# Patient Record
Sex: Male | Born: 1975
Health system: Southern US, Community
[De-identification: ages and names within clinical notes are randomized; demographics above are authoritative.]

## PROBLEM LIST (undated history)

## (undated) DIAGNOSIS — F419 Anxiety disorder, unspecified: Secondary | ICD-10-CM

## (undated) DIAGNOSIS — F1020 Alcohol dependence, uncomplicated: Secondary | ICD-10-CM

## (undated) DIAGNOSIS — F329 Major depressive disorder, single episode, unspecified: Secondary | ICD-10-CM

## (undated) DIAGNOSIS — R55 Syncope and collapse: Secondary | ICD-10-CM

## (undated) DIAGNOSIS — F32A Depression, unspecified: Secondary | ICD-10-CM

## (undated) DIAGNOSIS — Z21 Asymptomatic human immunodeficiency virus [HIV] infection status: Secondary | ICD-10-CM

## (undated) DIAGNOSIS — G40909 Epilepsy, unspecified, not intractable, without status epilepticus: Secondary | ICD-10-CM

## (undated) DIAGNOSIS — B20 Human immunodeficiency virus [HIV] disease: Secondary | ICD-10-CM

## (undated) HISTORY — DX: Syncope and collapse: R55

## (undated) HISTORY — DX: Human immunodeficiency virus (HIV) disease: B20

## (undated) HISTORY — DX: Asymptomatic human immunodeficiency virus (hiv) infection status: Z21

## (undated) HISTORY — DX: Epilepsy, unspecified, not intractable, without status epilepticus: G40.909

## (undated) HISTORY — PX: SURGERY SCROTAL / TESTICULAR: SUR1316

---

## 1999-09-19 ENCOUNTER — Emergency Department (HOSPITAL_COMMUNITY): Admission: EM | Admit: 1999-09-19 | Discharge: 1999-09-19 | Payer: Self-pay | Admitting: Emergency Medicine

## 1999-10-18 ENCOUNTER — Emergency Department (HOSPITAL_COMMUNITY): Admission: EM | Admit: 1999-10-18 | Discharge: 1999-10-18 | Payer: Self-pay | Admitting: Emergency Medicine

## 1999-10-18 ENCOUNTER — Encounter: Payer: Self-pay | Admitting: Emergency Medicine

## 1999-10-19 ENCOUNTER — Encounter: Payer: Self-pay | Admitting: Emergency Medicine

## 2002-09-19 ENCOUNTER — Emergency Department (HOSPITAL_COMMUNITY): Admission: EM | Admit: 2002-09-19 | Discharge: 2002-09-19 | Payer: Self-pay | Admitting: Emergency Medicine

## 2002-09-19 ENCOUNTER — Encounter: Payer: Self-pay | Admitting: Emergency Medicine

## 2002-12-07 ENCOUNTER — Ambulatory Visit (HOSPITAL_COMMUNITY): Admission: RE | Admit: 2002-12-07 | Discharge: 2002-12-07 | Payer: Self-pay | Admitting: Urology

## 2002-12-07 ENCOUNTER — Ambulatory Visit (HOSPITAL_BASED_OUTPATIENT_CLINIC_OR_DEPARTMENT_OTHER): Admission: RE | Admit: 2002-12-07 | Discharge: 2002-12-07 | Payer: Self-pay | Admitting: Urology

## 2006-09-28 ENCOUNTER — Emergency Department (HOSPITAL_COMMUNITY): Admission: EM | Admit: 2006-09-28 | Discharge: 2006-09-28 | Payer: Self-pay | Admitting: Emergency Medicine

## 2008-07-05 ENCOUNTER — Emergency Department (HOSPITAL_COMMUNITY): Admission: EM | Admit: 2008-07-05 | Discharge: 2008-07-05 | Payer: Self-pay | Admitting: Emergency Medicine

## 2010-07-26 NOTE — Op Note (Signed)
[name], Thomas Atkins                            ACCOUNT NO.:  [id number]   MEDICAL RECORD NO.:  [id number]                   PATIENT TYPE:  AMB   LOCATION:  [facility]                                 FACILITY:  [facility]   PHYSICIAN:  Maretta Bees. Vonita Moss, M.D.             DATE OF BIRTH:  October 14, 1975   DATE OF PROCEDURE:  DATE OF DISCHARGE:                                 OPERATIVE REPORT   PREOPERATIVE DIAGNOSIS:  Large left hydrocele and penile adhesion.   POSTOPERATIVE DIAGNOSIS:  Large left hydrocele and penile adhesion.   OPERATION/PROCEDURE:  1. Left hydrocelectomy.  2. Lysis of penile adhesion.   SURGEON:  Maretta Bees. Vonita Moss, M.D.   ANESTHESIA:  General.   INDICATIONS:  This 35 year old gentleman has had a long history of  progressive swelling of the left scrotum documented on ultrasound as a  hydrocele and he is bothered by its large size and discomfort. He also has  residual adhesions in the dorsum of the penis measuring 2 cm on the top of  the penis between the shaft and the corona of the glans penis.   DESCRIPTION OF PROCEDURE:  The patient is brought to the operating room and  placed in the lithotomy position.  External genitalia were prepped and  draped in the usual fashion.  Using sharp dissection this penile adhesions  was lysed and the corona was sutured over it with 4-0 chromic catgut and the  other end on the shaft of the penis was also closed with running 4-0 chromic  catgut.  The vertical incision was then made in the left scrotum and the  hydrocele delivered and opened up and drained of several ounces of clear  typical hydrocele fluid.  A large portion of the hydrocele sac was then  resected with care to protect the vasculature and the vas deferens.  The  edge of the hydrocele sac was fulgurated.  It was then sewn behind the penis  to prevent recurrence using running 3-0 chromic catgut with care not to make  the closure too tight on the cord.  Hemostasis was obtained  with the use of  electrocautery.  An appendix testis was fulgurated.  The scrotum was  irrigated with sterile water and the wound closed with two layers  of  running 3-0 chromic catgut.  The wound was cleaned and dressed with  Neosporin and dry sterile gauze dressings.  The patient was taken to the  recovery room in good condition with no significant blood loss and good  hemostasis having tolerated the procedure well with a correct sponge, needle  and instrument count.                                                 Maretta Bees. Vonita Moss, M.D.  LJP/MEDQ  D:  12/07/2002  T:  12/07/2002  Job:  [id number]   cc:   [facility]

## 2010-10-20 ENCOUNTER — Emergency Department (HOSPITAL_COMMUNITY): Payer: Self-pay

## 2010-10-20 ENCOUNTER — Emergency Department (HOSPITAL_COMMUNITY)
Admission: EM | Admit: 2010-10-20 | Discharge: 2010-10-21 | Disposition: A | Discharge status: Home / Self Care | Payer: Self-pay | Attending: Emergency Medicine | Admitting: Emergency Medicine

## 2010-10-20 DIAGNOSIS — F1010 Alcohol abuse, uncomplicated: Secondary | ICD-10-CM | POA: Insufficient documentation

## 2010-10-20 DIAGNOSIS — F1410 Cocaine abuse, uncomplicated: Secondary | ICD-10-CM | POA: Insufficient documentation

## 2010-10-20 DIAGNOSIS — R55 Syncope and collapse: Secondary | ICD-10-CM | POA: Insufficient documentation

## 2010-10-20 DIAGNOSIS — I4510 Unspecified right bundle-branch block: Secondary | ICD-10-CM | POA: Insufficient documentation

## 2010-10-20 LAB — POCT I-STAT, CHEM 8
BUN: 9 mg/dL (ref 6–23)
Calcium, Ion: 1.08 mmol/L — ABNORMAL LOW (ref 1.12–1.32)
Chloride: 110 meq/L (ref 96–112)
Creatinine, Ser: 1.2 mg/dL (ref 0.50–1.35)
Glucose, Bld: 80 mg/dL (ref 70–99)
HCT: 49 % (ref 39.0–52.0)
Hemoglobin: 16.7 g/dL (ref 13.0–17.0)
Potassium: 4 mEq/L (ref 3.5–5.1)
Sodium: 143 mEq/L (ref 135–145)
TCO2: 20 mmol/L (ref 0–100)

## 2010-10-20 LAB — CBC
Platelets: 212 10*3/uL (ref 150–400)
RBC: 4.69 MIL/uL (ref 4.22–5.81)
WBC: 8.4 10*3/uL (ref 4.0–10.5)

## 2010-10-20 LAB — DIFFERENTIAL
Basophils Absolute: 0.1 10*3/uL (ref 0.0–0.1)
Basophils Relative: 1 % (ref 0–1)
Eosinophils Absolute: 0.2 10*3/uL (ref 0.0–0.7)
Neutrophils Relative %: 42 % — ABNORMAL LOW (ref 43–77)

## 2010-10-20 LAB — POCT I-STAT TROPONIN I: Troponin i, poc: 0.01 ng/mL (ref 0.00–0.08)

## 2010-10-21 LAB — RAPID URINE DRUG SCREEN, HOSP PERFORMED
Amphetamines: NOT DETECTED
Barbiturates: NOT DETECTED
Cocaine: POSITIVE — AB
Tetrahydrocannabinol: NOT DETECTED

## 2010-10-21 LAB — URINALYSIS, ROUTINE W REFLEX MICROSCOPIC
Nitrite: NEGATIVE
Specific Gravity, Urine: 1.013 (ref 1.005–1.030)
Urobilinogen, UA: 0.2 mg/dL (ref 0.0–1.0)

## 2010-10-21 LAB — URINE MICROSCOPIC-ADD ON

## 2010-10-21 LAB — ETHANOL: Alcohol, Ethyl (B): 274 mg/dL — ABNORMAL HIGH (ref 0–11)

## 2010-12-11 ENCOUNTER — Inpatient Hospital Stay (INDEPENDENT_AMBULATORY_CARE_PROVIDER_SITE_OTHER)
Admission: RE | Admit: 2010-12-11 | Discharge: 2010-12-11 | Disposition: A | Discharge status: Home / Self Care | Payer: Self-pay | Source: Ambulatory Visit | Attending: Emergency Medicine | Admitting: Emergency Medicine

## 2010-12-11 DIAGNOSIS — K5289 Other specified noninfective gastroenteritis and colitis: Secondary | ICD-10-CM

## 2010-12-11 DIAGNOSIS — N419 Inflammatory disease of prostate, unspecified: Secondary | ICD-10-CM

## 2010-12-11 LAB — POCT URINALYSIS DIP (DEVICE)
Bilirubin Urine: NEGATIVE
Glucose, UA: NEGATIVE mg/dL
Ketones, ur: NEGATIVE mg/dL
Leukocytes, UA: NEGATIVE
Nitrite: NEGATIVE
Protein, ur: NEGATIVE mg/dL
Specific Gravity, Urine: 1.01 (ref 1.005–1.030)
Urobilinogen, UA: 0.2 mg/dL (ref 0.0–1.0)
pH: 7 (ref 5.0–8.0)

## 2010-12-11 LAB — RAPID URINE DRUG SCREEN, HOSP PERFORMED
Barbiturates: NOT DETECTED
Cocaine: NOT DETECTED
Tetrahydrocannabinol: NOT DETECTED

## 2010-12-11 LAB — COMPREHENSIVE METABOLIC PANEL WITH GFR
ALT: 54 U/L — ABNORMAL HIGH (ref 0–53)
AST: 52 U/L — ABNORMAL HIGH (ref 0–37)
Albumin: 4.5 g/dL (ref 3.5–5.2)
Alkaline Phosphatase: 65 U/L (ref 39–117)
BUN: 9 mg/dL (ref 6–23)
CO2: 27 meq/L (ref 19–32)
Calcium: 10.1 mg/dL (ref 8.4–10.5)
Chloride: 101 meq/L (ref 96–112)
Creatinine, Ser: 0.79 mg/dL (ref 0.50–1.35)
GFR calc Af Amer: >90 mL/min
GFR calc non Af Amer: >90 mL/min
Glucose, Bld: 81 mg/dL (ref 70–99)
Potassium: 4.1 meq/L (ref 3.5–5.1)
Sodium: 138 meq/L (ref 135–145)
Total Bilirubin: 0.6 mg/dL (ref 0.3–1.2)
Total Protein: 8 g/dL (ref 6.0–8.3)

## 2010-12-11 LAB — CBC
HCT: 45.8 % (ref 39.0–52.0)
MCHC: 36.2 g/dL — ABNORMAL HIGH (ref 30.0–36.0)
Platelets: 221 10*3/uL (ref 150–400)
RDW: 12.8 % (ref 11.5–15.5)
WBC: 8.6 10*3/uL (ref 4.0–10.5)

## 2010-12-11 LAB — URINALYSIS, DIPSTICK ONLY
Bilirubin Urine: NEGATIVE
Glucose, UA: NEGATIVE mg/dL
Hgb urine dipstick: NEGATIVE
Ketones, ur: NEGATIVE mg/dL
Leukocytes, UA: NEGATIVE
Nitrite: NEGATIVE
Protein, ur: NEGATIVE mg/dL
Specific Gravity, Urine: 1.007 (ref 1.005–1.030)
Urobilinogen, UA: 0.2 mg/dL (ref 0.0–1.0)
pH: 7 (ref 5.0–8.0)

## 2010-12-11 LAB — OCCULT BLOOD, POC DEVICE: Fecal Occult Bld: NEGATIVE

## 2010-12-11 LAB — DIFFERENTIAL
Basophils Absolute: 0.1 10*3/uL (ref 0.0–0.1)
Basophils Relative: 1 % (ref 0–1)
Eosinophils Absolute: 0.2 10*3/uL (ref 0.0–0.7)
Eosinophils Relative: 3 % (ref 0–5)
Lymphocytes Relative: 31 % (ref 12–46)

## 2010-12-12 ENCOUNTER — Encounter: Payer: Self-pay | Admitting: Cardiovascular Disease

## 2010-12-12 LAB — URINE CULTURE
Colony Count: NO GROWTH
Culture  Setup Time: 201210031736

## 2010-12-13 ENCOUNTER — Encounter: Payer: Self-pay | Admitting: Cardiovascular Disease

## 2010-12-13 ENCOUNTER — Ambulatory Visit (INDEPENDENT_AMBULATORY_CARE_PROVIDER_SITE_OTHER): Payer: Self-pay | Admitting: Cardiovascular Disease

## 2010-12-13 DIAGNOSIS — R079 Chest pain, unspecified: Secondary | ICD-10-CM

## 2010-12-13 NOTE — Assessment & Plan Note (Signed)
Atypical chest pain. H/O tobacco abuse and pectus excavatum which could be associated with congenital abnormalities. Will get echo and arrange exercise treadmill stress test.

## 2010-12-13 NOTE — Patient Instructions (Signed)
Your physician has requested that you have an echocardiogram. Echocardiography is a painless test that uses sound waves to create images of your heart. It provides your doctor with information about the size and shape of your heart and how well your heart's chambers and valves are working. This procedure takes approximately one hour. There are no restrictions for this procedure.  Your physician has requested that you have an exercise tolerance test. For further information please visit [email or web address]. Please also follow instruction sheet, as given.   

## 2010-12-13 NOTE — Progress Notes (Signed)
History of Present Illness:35 yo WM with history of substance abuse, anxiety, ? Seizure disorder here today to establish cardiac care. He was seen in the Urgent care several days ago with prostatitis, diarrhea and was given Cipro. He tells me that he has been doing well overall. He walks frequently. He has a history of pectus excavatum and has frequent chest pains. He has been smoking for 22 years. He smokes 1/2 ppd. He describes an episode of passing out while having sex with his girlfriend. There was a question of seizure activity. He has chest pains on a daily basis, mild pressure. Also SOB lately with walking.   Past Medical History  Diagnosis Date  . Syncope     Past Surgical History  Procedure Date  . Surgery scrotal / testicular     Current Outpatient Prescriptions  Medication Sig Dispense Refill  . ciprofloxacin (CIPRO) 500 MG/5ML (10%) suspension Take by mouth 2 (two) times daily.        Marland Kitchen guaifenesin (HUMIBID E) 400 MG TABS Take 400 mg by mouth every 4 (four) hours.        . NON FORMULARY OVER THE COUNTER STORE BRAND PAIN RELIEF PM as needed for sleep         Allergies  Allergen Reactions  . Ultram (Tramadol Hcl)     History   Social History  . Marital Status: Divorced    Spouse Name: N/A    Number of Children: 2  . Years of Education: N/A   Occupational History  . Unemployed    Social History Main Topics  . Smoking status: Current Everyday Smoker  . Smokeless tobacco: Not on file  . Alcohol Use: Yes  . Drug Use: Yes  . Sexually Active: Not on file   Other Topics Concern  . Not on file   Social History Narrative  . No narrative on file    Family History  Problem Relation Age of Onset  . Heart disease Paternal Grandmother     Review of Systems:  As stated in the HPI and otherwise negative.   BP 121/86  Pulse 86  Ht 6\' 1"  (1.854 m)  Wt 150 lb (68.04 kg)  BMI 19.79 kg/m2  Physical Examination: General: Well developed, well nourished, NAD HEENT:  OP clear, mucus membranes moist SKIN: warm, dry. No rashes. Neuro: No focal deficits Musculoskeletal: Muscle strength 5/5 all extChest wall with pectus excavatum. Psychiatric: Mood and affect normal Neck: No JVD, no carotid bruits, no thyromegaly, no lymphadenopathy. Lungs:Clear bilaterally, no wheezes, rhonci, crackles Cardiovascular: Regular rate and rhythm. No murmurs, gallops or rubs. Abdomen:Soft. Bowel sounds present. Non-tender.  Extremities: No lower extremity edema. Pulses are 2 + in the bilateral DP/PT.  EKG: NSR, rate 76 bpm. Incomplete RBBB. LVH

## 2010-12-23 LAB — I-STAT 8, (EC8 V) (CONVERTED LAB)
BUN: 14
Bicarbonate: 20.2
Chloride: 111
Glucose, Bld: 88
HCT: 48
Hemoglobin: 16.3
Operator id: 146091
Sodium: 143
pCO2, Ven: 33.3 — ABNORMAL LOW

## 2010-12-23 LAB — POCT I-STAT CREATININE: Creatinine, Ser: 1

## 2010-12-23 LAB — POCT CARDIAC MARKERS: Operator id: 146091

## 2010-12-30 ENCOUNTER — Ambulatory Visit (INDEPENDENT_AMBULATORY_CARE_PROVIDER_SITE_OTHER): Payer: Self-pay | Admitting: Cardiovascular Disease

## 2010-12-30 ENCOUNTER — Ambulatory Visit (HOSPITAL_COMMUNITY): Payer: Self-pay | Attending: Cardiovascular Disease | Admitting: Radiology

## 2010-12-30 DIAGNOSIS — R55 Syncope and collapse: Secondary | ICD-10-CM | POA: Insufficient documentation

## 2010-12-30 DIAGNOSIS — I079 Rheumatic tricuspid valve disease, unspecified: Secondary | ICD-10-CM | POA: Insufficient documentation

## 2010-12-30 DIAGNOSIS — R079 Chest pain, unspecified: Secondary | ICD-10-CM

## 2010-12-30 DIAGNOSIS — R0609 Other forms of dyspnea: Secondary | ICD-10-CM | POA: Insufficient documentation

## 2010-12-30 DIAGNOSIS — I059 Rheumatic mitral valve disease, unspecified: Secondary | ICD-10-CM | POA: Insufficient documentation

## 2010-12-30 DIAGNOSIS — R0989 Other specified symptoms and signs involving the circulatory and respiratory systems: Secondary | ICD-10-CM | POA: Insufficient documentation

## 2010-12-30 DIAGNOSIS — R072 Precordial pain: Secondary | ICD-10-CM | POA: Insufficient documentation

## 2010-12-30 NOTE — Progress Notes (Signed)
Exercise Treadmill Test  Pre-Exercise Testing Evaluation Rhythm: normal sinus  Rate: 64   PR:  .15 QRS:  .10  QT:  .38 QTc: .39     Test  Exercise Tolerance Test Ordering MD: Melene Muller, MD  Interpreting MD:  Melene Muller, MD  Unique Test No: 1  Treadmill:  1  Indication for ETT: chest pain - rule out ischemia  Contraindication to ETT: No   Stress Modality: exercise - treadmill  Cardiac Imaging Performed: non   Protocol: standard Bruce - maximal  Max BP:  159/94  Max MPHR (bpm):  183 85% MPR (bpm):  155  MPHR obtained (bpm):  169 % MPHR obtained:  93  Reached 85% MPHR (min:sec):  7:00 Total Exercise Time (min-sec):  9:41  Workload in METS:  11.2 Borg Scale: 13  Reason ETT Terminated:  dyspnea    ST Segment Analysis At Rest: normal ST segments - no evidence of significant ST depression With Exercise: no evidence of significant ST depression  Other Information Arrhythmia:  No Angina during ETT:  absent (0) Quality of ETT:  non-diagnostic  ETT Interpretation:  normal - no evidence of ischemia by ST analysis  Comments: Pt exercised for 9 minutes and 41 seconds. He had no chest pain with exercise. No EKG changes to suggest ischemia. Normal blood pressure response to exercise.   Recommendations: No further ischemia workup.

## 2011-07-30 ENCOUNTER — Emergency Department (HOSPITAL_COMMUNITY)
Admission: EM | Admit: 2011-07-30 | Discharge: 2011-07-31 | Disposition: A | Discharge status: Home / Self Care | Payer: Self-pay | Attending: Emergency Medicine | Admitting: Emergency Medicine

## 2011-07-30 ENCOUNTER — Emergency Department (HOSPITAL_COMMUNITY): Payer: Self-pay

## 2011-07-30 ENCOUNTER — Encounter (HOSPITAL_COMMUNITY): Payer: Self-pay | Admitting: *Deleted

## 2011-07-30 DIAGNOSIS — R569 Unspecified convulsions: Secondary | ICD-10-CM | POA: Insufficient documentation

## 2011-07-30 DIAGNOSIS — R112 Nausea with vomiting, unspecified: Secondary | ICD-10-CM | POA: Insufficient documentation

## 2011-07-30 DIAGNOSIS — F17200 Nicotine dependence, unspecified, uncomplicated: Secondary | ICD-10-CM | POA: Insufficient documentation

## 2011-07-30 DIAGNOSIS — R7402 Elevation of levels of lactic acid dehydrogenase (LDH): Secondary | ICD-10-CM | POA: Insufficient documentation

## 2011-07-30 DIAGNOSIS — R059 Cough, unspecified: Secondary | ICD-10-CM | POA: Insufficient documentation

## 2011-07-30 DIAGNOSIS — R7401 Elevation of levels of liver transaminase levels: Secondary | ICD-10-CM

## 2011-07-30 DIAGNOSIS — R1013 Epigastric pain: Secondary | ICD-10-CM | POA: Insufficient documentation

## 2011-07-30 DIAGNOSIS — F1020 Alcohol dependence, uncomplicated: Secondary | ICD-10-CM

## 2011-07-30 DIAGNOSIS — R109 Unspecified abdominal pain: Secondary | ICD-10-CM | POA: Insufficient documentation

## 2011-07-30 DIAGNOSIS — R05 Cough: Secondary | ICD-10-CM | POA: Insufficient documentation

## 2011-07-30 HISTORY — DX: Alcohol dependence, uncomplicated: F10.20

## 2011-07-30 LAB — CBC
MCH: 33.3 pg (ref 26.0–34.0)
Platelets: 188 10*3/uL (ref 150–400)
RBC: 4.92 MIL/uL (ref 4.22–5.81)
RDW: 13.6 % (ref 11.5–15.5)
WBC: 7.9 10*3/uL (ref 4.0–10.5)

## 2011-07-30 LAB — COMPREHENSIVE METABOLIC PANEL
AST: 81 U/L — ABNORMAL HIGH (ref 0–37)
Alkaline Phosphatase: 70 U/L (ref 39–117)
BUN: 9 mg/dL (ref 6–23)
CO2: 21 mEq/L (ref 19–32)
Chloride: 104 mEq/L (ref 96–112)
Creatinine, Ser: 0.8 mg/dL (ref 0.50–1.35)
GFR calc non Af Amer: >90 mL/min (ref >90)
Potassium: 4.1 mEq/L (ref 3.5–5.1)
Total Bilirubin: 0.4 mg/dL (ref 0.3–1.2)

## 2011-07-30 MED ORDER — ONDANSETRON HCL 4 MG/2ML IJ SOLN
4.0000 mg | Freq: Once | INTRAMUSCULAR | Status: AC
  Administered 2011-07-31: 4 mg via INTRAVENOUS
  Filled 2011-07-30: qty 2

## 2011-07-30 MED ORDER — FENTANYL CITRATE 0.05 MG/ML IJ SOLN
100.0000 ug | Freq: Once | INTRAMUSCULAR | Status: AC
  Administered 2011-07-31: 100 ug via INTRAVENOUS
  Filled 2011-07-30: qty 2

## 2011-07-30 NOTE — ED Notes (Addendum)
C/o RUQ/rib pain, "describes as under rib", onset Monday, also cough, congestion, nvd, (denies: fever). 2 BM today, both incontinant diarrhea, last BM 1630, last ate 1530, last ETOH 2030, last ativan this am, admits to alcoholism, states, "not going into DTs", alert, NAD, calm, interactive, carrying emesis bag. Last emesis PTA.

## 2011-07-31 LAB — DIFFERENTIAL
Basophils Absolute: 0.1 10*3/uL (ref 0.0–0.1)
Eosinophils Absolute: 0.2 10*3/uL (ref 0.0–0.7)
Eosinophils Relative: 3 % (ref 0–5)
Lymphs Abs: 4.3 10*3/uL — ABNORMAL HIGH (ref 0.7–4.0)
Monocytes Absolute: 0.6 10*3/uL (ref 0.1–1.0)
Neutrophils Relative %: 34 % — ABNORMAL LOW (ref 43–77)

## 2011-07-31 LAB — LIPASE, BLOOD: Lipase: 57 U/L (ref 11–59)

## 2011-07-31 MED ORDER — PANTOPRAZOLE SODIUM 20 MG PO TBEC: 40.0000 mg | DELAYED_RELEASE_TABLET | Freq: Every day | ORAL | Status: DC

## 2011-07-31 MED ORDER — ONDANSETRON HCL 4 MG PO TABS: 4.0000 mg | ORAL_TABLET | Freq: Four times a day (QID) | ORAL | Status: DC

## 2011-07-31 MED ORDER — SODIUM CHLORIDE 0.9 % IV SOLN
Freq: Once | INTRAVENOUS | Status: AC
Start: 2011-07-31 — End: 2011-07-31
  Administered 2011-07-31: via INTRAVENOUS

## 2011-07-31 NOTE — Discharge Instructions (Signed)
Please take medications as prescribed. Avoid products that contain Tylenol. Tried to cut back or limit your drinking. Return to the emergency room for worsening condition or new concerning symptoms. Please followup with your primary care doctor and/or the specialist listed above. If you do not have a doctor, used the resource list below to establish a primary care provider.  RESOURCE GUIDE  Dental Problems  Patients with Medicaid: [facility] [phone]                                           [phone] [address] [employer] [address] Phone:  [phone]                                                  Phone:  [phone]  If unable to pay or uninsured, contact:  [facility] or [facility]. to become qualified for the adult dental clinic.  Chronic Pain Problems Contact [facility]  [phone] Patients need to be referred by their primary care doctor.  Insufficient Money for Medicine Contact [employer] Way:  call "211" or [facility] [phone].  No Primary Care Doctor Call [facility]  [phone] Other agencies that provide inexpensive medical care    [facility]  [phone]    [facility] Internal [facility]  [phone]    [facility]  [phone]    [facility]  [phone]    [facility]  [phone]    [facility]  [phone]  Psychological Services [facility] Behavioral Health  [phone] [facility]  [phone] [facility] Mental Health   [phone] (emergency services [phone])  Substance Abuse Resources [facility] Services  [phone] [facility] [phone] [facility] [place] [phone] [place] [phone] Residential & Outpatient Substance Abuse Program  [phone]  Abuse/Neglect [facility] Child Abuse Hotline ([phone] [facility] Child Abuse Hotline (218)834-5156 (After Hours)  Emergency Shelter [facility] [phone]  M[facility] [place] [facility] [place] [phone] [facility] [phone]  MRSA Hotline #:   405 131 2357    [facility] Resources  [facility] [place]     [employer]                          [facility]. [address] [place]                       [address]      [address]  [place]                                                Cristobal Goldmann Phone:  [phone]  Phone:  [phone]                 Phone:  [phone]  [facility] Mental Health Phone:  [phone]  [facility] Child Abuse Hotline [phone] [phone] (After Hours)  Abdominal Pain Abdominal pain can be caused by many things. Your caregiver decides the seriousness of your pain by an examination and possibly blood tests and X-rays. Many cases can be observed and treated at home. Most abdominal pain is not caused by a disease and will probably improve without treatment. However, in many cases, more time must pass before a clear cause of the pain can be found. Before that point, it may not be known if you need more testing, or if hospitalization or surgery is needed. HOME CARE INSTRUCTIONS   Do not take laxatives unless directed by your caregiver.   Take pain medicine only as directed by your caregiver.   Only take over-the-counter or prescription medicines for pain, discomfort, or fever as directed by your caregiver.   Try a clear liquid diet (broth, tea, or water) for as long as directed by your caregiver. Slowly move to a bland diet as tolerated.  SEEK IMMEDIATE MEDICAL CARE IF:   The pain does not go away.   You have a fever.   You keep throwing up (vomiting).   The pain is felt only in portions of the abdomen. Pain in the right side could possibly be appendicitis. In an adult, pain in the left lower portion of the  abdomen could be colitis or diverticulitis.   You pass bloody or black tarry stools.  MAKE SURE YOU:   Understand these instructions.   Will watch your condition.   Will get help right away if you are not doing well or get worse.  Document Released: 12/04/2004 Document Revised: 02/13/2011 Document Reviewed: 10/13/2007 [facility] Patient Information 2012 [place], [place].  Chronic Alcoholism Alcoholism is an addiction to alcohol. Addiction is a medical illness. It is not an one-time incident of heavy drinking that defines the disease of alcoholism.  The characteristics of addictive disease, such as alcoholism, include behaviors that the person finds pleasurable, at least initially. In alcohol addiction, drinking causes chemical changes in brain activity. This can lead to frequent cravings for alcohol. Unfortunately over time, an increased amount of alcohol is needed to produce the pleasure (tolerance). As a result, the person will start to feel uncomfortable symptoms when he or she is not drinking (withdrawal). Over time, a bad cycle develops. When painful withdrawal symptoms start to appear, alcohol is needed to make the symptoms go away. During this process, the person addicted to alcohol may become so used to the effects of alcohol that the usual signs of intoxication such as slurred speech, a staggering walk, or sleepiness may no longer be shown. Many people addicted to alcohol are able to function and even complete tasks. CAUSES   Drinking heavily and frequently.   Other factors like genetics.  SYMPTOMS   Headaches.   Frequent trouble falling or staying asleep (insomnia).   Irritability.   Uncontrolled shaking or movement (tremors).   Forgetting events (brownouts) or passing out (blackouts).   Seizures or hallucinations (delirium tremens).   Problems at work or at home that are related to drinking.   Medical problems related to drinking such as heart disease, stroke, high blood  pressure, diabetes, stomach ulcers, bleeding from the GI tract, and liver failure.   Trauma (falls, broken bones, automobile crashes).  TREATMENT  Alcoholism usually gets worse over time and almost never gets better without treatment. Your caregiver can help recommend a course of treatment for you depending on how severe your symptoms are and the level of your alcohol abuse. In some patients, stopping alcohol use or even decreasing use can bring about withdrawal symptoms that are dangerous or even deadly. For this reason, hospitalization is sometimes required to medically stabilize a patient.  If hospitalization is not required, but the risk of withdrawal is high, a detoxification (detox) facility may be recommended as an initial treatment step. In a detox center, medications can be given to protect against seizures and other withdrawal symptoms. Rehabilitation treatment may also be necessary. This is the process of treating the psychological and lifestyle element of addiction. There is both inpatient and outpatient rehabilitation treatment. Inpatient programs help patients through a systematic plan of psychological questioning, both individually and in groups, and at times using a "twelve-step" format. Outpatient rehabilitation programs have a similar structure and are aimed at promoting continued sobriety and preventing relapse into addiction. Make treatment decisions together with your caregiver. HOME CARE INSTRUCTIONS   If you are concerned about your alcohol use, talk to someone who can help. Trying to quit on your own is not easy. It can even be medically dangerous. This is especially true if you have been drinking heavily for a long time.   Call your caregiver, Alcoholics Anonymous, or other alcoholic treatment programs for help.   AL-ANON and ALA-TEEN are support groups for friends and family members of an alcohol or drug dependent person. These people also often need help too. For information  about these organizations, check your phone directory or the internet. You can also call a local alcohol or chemical dependency treatment center.  Document Released: 04/03/2004 Document Revised: 02/13/2011 Document Reviewed: 08/10/2009 [facility] Patient Information 2012 [place], [place].  B.R.A.T. Diet Your doctor has recommended the B.R.A.T. diet for you or your child until the condition improves. This is often used to help control diarrhea and vomiting symptoms. If you or your child can tolerate clear liquids, you may have:  Bananas.   Rice.   Applesauce.   Toast (and other simple starches such as crackers, potatoes, noodles).  Be sure to avoid dairy products, meats, and fatty foods until symptoms are better. Fruit juices such as apple, grape, and prune juice can make diarrhea worse. Avoid these. Continue this diet for 2 days or as instructed by your caregiver. Document Released: 02/24/2005 Document Revised: 02/13/2011 Document Reviewed: 08/13/2006 [facility] Patient Information 2012 [place], [place].   B.R.A.T. Diet Your doctor has recommended the B.R.A.T. diet for you or your child until the condition improves. This is often used to help control diarrhea and vomiting symptoms. If you or your child can tolerate clear liquids, you may have:  Bananas.   Rice.   Applesauce.   Toast (and other simple starches such as crackers, potatoes, noodles).  Be sure to avoid dairy products, meats, and fatty foods until symptoms are better. Fruit juices such as apple, grape, and prune juice can make diarrhea worse. Avoid these. Continue this diet for 2 days or as instructed by your caregiver. Document Released: 02/24/2005 Document Revised: 02/13/2011 Document Reviewed: 08/13/2006 [facility] Patient Information 2012 [place], [place].

## 2011-07-31 NOTE — ED Provider Notes (Signed)
History     CSN: [id number]  Arrival date & time 07/30/11  2220   First MD Initiated Contact with Patient 07/30/11 2321      Chief Complaint  Patient presents with  . Abdominal Pain  . Emesis    (Consider location/radiation/quality/duration/timing/severity/associated sxs/prior treatment) HPI 36 year old male presents to emergency department complaining of right-sided abdominal pain, epigastric pain, nausea and vomiting. Patient reports onset of symptoms Tuesday worsening on Wednesday. Patient reports he has a chronic cough each morning, it usually causes posttussive emesis. However over the last 2 days, he has had vomiting without cough. He denies any fever. Patient denies history of gallbladder disease, liver disease or gastritis. Patient is an alcoholic. He reports taking one half of a 0.5 Ativan given to him by a friend due to the shakes this morning. Patient was able to eat and drink today without difficulty. Eating does not make the pain worse. Patient drank alcohol this evening around 8 PM. Patient denies previous history of pancreatitis. Pain is a dull ache mainly up under his right sided ribs and epigastrium.  Past Medical History  Diagnosis Date  . Syncope   . Seizure disorder   . Alcoholism     Past Surgical History  Procedure Date  . Surgery scrotal / testicular     Family History  Problem Relation Age of Onset  . Heart disease Paternal Grandmother     History  Substance Use Topics  . Smoking status: Current Everyday Smoker -- 0.5 packs/day  . Smokeless tobacco: Not on file  . Alcohol Use: Yes      Review of Systems  All other systems reviewed and are negative.    Allergies  Ultram  Home Medications   Current Outpatient Rx  Name Route Sig Dispense Refill  . LORAZEPAM PO Oral Take 1 tablet by mouth once.      BP 112/87  Pulse 101  Temp(Src) 98.1 F (36.7 C) (Oral)  Resp 18  SpO2 97%  Physical Exam  Nursing note and vitals  reviewed. Constitutional: He is oriented to person, place, and time. He appears well-developed and well-nourished. No distress.       Patient is noted to have mild tachycardia  HENT:  Head: Normocephalic and atraumatic.  Eyes: Pupils are equal, round, and reactive to light.  Neck: Normal range of motion. Neck supple. No JVD present. No tracheal deviation present. No thyromegaly present.  Cardiovascular: Normal rate, regular rhythm, normal heart sounds and intact distal pulses.  Exam reveals no gallop and no friction rub.   No murmur heard. Pulmonary/Chest: Effort normal and breath sounds normal. No stridor. No respiratory distress. He has no wheezes. He has no rales. He exhibits no tenderness.  Abdominal: Soft. He exhibits no distension. There is no rebound and no guarding.       Pain with palpation in epigastrium mild tenderness in right upper quadrant without significant Murphy's sign  Musculoskeletal: Normal range of motion. He exhibits no edema and no tenderness.  Lymphadenopathy:    He has no cervical adenopathy.  Neurological: He is alert and oriented to person, place, and time. He exhibits normal muscle tone. Coordination normal.  Skin: He is not diaphoretic.  Psychiatric: He has a normal mood and affect. His behavior is normal. Judgment and thought content normal.    ED Course  Procedures (including critical care time)  Labs Reviewed  COMPREHENSIVE METABOLIC PANEL - Abnormal; Notable for the following:    AST 81 (*)    ALT  58 (*)    All other components within normal limits  DIFFERENTIAL - Abnormal; Notable for the following:    Neutrophils Relative 34 (*)    Lymphocytes Relative 54 (*)    Lymphs Abs 4.3 (*)    All other components within normal limits  CBC  LIPASE, BLOOD   Dg Chest 2 View  07/30/2011  *RADIOLOGY REPORT*  Clinical Data: Vomiting  CHEST - 2 VIEW  Comparison: 10/20/2010  Findings: Pectus excavatum and mild thoracic spine curvature. Lungs are expanded with  mild right apical pleural thickening again noted.  Otherwise, no focal areas of consolidation.  No pleural effusion or pneumothorax identified.  IMPRESSION: No radiographic evidence of acute cardiopulmonary process.  Original Report Authenticated By: Waneta Martins, M.D.     1. Abdominal pain   2. Nausea and vomiting   3. Alcoholism   4. Transaminitis       MDM  36 year old male with right-sided and epigastric abdominal pain. Differential includes pancreatitis, gastritis, cholelithiasis/cholecystitis, or liver disease. Will get lab work give IV fluids and pain and nausea medicine, may need ultrasound of abdomen as well.  3:35 AM Patient now unwilling to stay for ultrasound. Will discharge home with follow up with gastroenterology will treat nausea and vomiting. Suspect possible gastritis no signs of pancreatitis. Suspect transaminitis secondary to his alcohol intake.       Olivia Mackie, MD 07/31/11 [phone]

## 2011-07-31 NOTE — ED Notes (Signed)
Pt given Ice per MD

## 2011-07-31 NOTE — ED Notes (Signed)
Pt stating that he wants to leave. MD informed that he does not want to stay any longer and Md to discharge pt. Pt signed form. Pt walked to discharge after being given discharge paperwork

## 2011-07-31 NOTE — ED Notes (Signed)
Pt trying to leave to go smoke. Pt informed that this nurse needs to take his IV out. Pt states that he will stay for now. Pt informed that ultrasound is coming from [place] and it might be a while. Nurse told pt to let nurse know if he is going to leave before he does

## 2011-08-08 ENCOUNTER — Emergency Department (HOSPITAL_COMMUNITY)
Admission: EM | Admit: 2011-08-08 | Discharge: 2011-08-08 | Disposition: A | Discharge status: Home / Self Care | Payer: Self-pay | Attending: Emergency Medicine | Admitting: Emergency Medicine

## 2011-08-08 ENCOUNTER — Emergency Department (HOSPITAL_COMMUNITY): Payer: Self-pay

## 2011-08-08 ENCOUNTER — Encounter (HOSPITAL_COMMUNITY): Payer: Self-pay | Admitting: Emergency Medicine

## 2011-08-08 DIAGNOSIS — F17200 Nicotine dependence, unspecified, uncomplicated: Secondary | ICD-10-CM | POA: Insufficient documentation

## 2011-08-08 DIAGNOSIS — R059 Cough, unspecified: Secondary | ICD-10-CM | POA: Insufficient documentation

## 2011-08-08 DIAGNOSIS — G40802 Other epilepsy, not intractable, without status epilepticus: Secondary | ICD-10-CM | POA: Insufficient documentation

## 2011-08-08 DIAGNOSIS — R0602 Shortness of breath: Secondary | ICD-10-CM | POA: Insufficient documentation

## 2011-08-08 DIAGNOSIS — R259 Unspecified abnormal involuntary movements: Secondary | ICD-10-CM | POA: Insufficient documentation

## 2011-08-08 DIAGNOSIS — F1010 Alcohol abuse, uncomplicated: Secondary | ICD-10-CM | POA: Insufficient documentation

## 2011-08-08 DIAGNOSIS — R05 Cough: Secondary | ICD-10-CM | POA: Insufficient documentation

## 2011-08-08 LAB — CBC
Hemoglobin: 16.5 g/dL (ref 13.0–17.0)
MCH: 34.2 pg — ABNORMAL HIGH (ref 26.0–34.0)
MCHC: 36.5 g/dL — ABNORMAL HIGH (ref 30.0–36.0)
Platelets: 181 10*3/uL (ref 150–400)
RDW: 13.5 % (ref 11.5–15.5)

## 2011-08-08 MED ORDER — ONDANSETRON HCL 4 MG/2ML IJ SOLN
4.0000 mg | Freq: Once | INTRAMUSCULAR | Status: AC
  Administered 2011-08-08: 4 mg via INTRAVENOUS
  Filled 2011-08-08: qty 2

## 2011-08-08 MED ORDER — SODIUM CHLORIDE 0.9 % IV BOLUS (SEPSIS)
1000.0000 mL | Freq: Once | INTRAVENOUS | Status: AC
  Administered 2011-08-08: 1000 mL via INTRAVENOUS

## 2011-08-08 MED ORDER — PREDNISONE 20 MG PO TABS: ORAL_TABLET | ORAL | Status: DC

## 2011-08-08 MED ORDER — LORAZEPAM 2 MG/ML IJ SOLN
1.0000 mg | Freq: Once | INTRAMUSCULAR | Status: AC
  Administered 2011-08-08: 1 mg via INTRAVENOUS
  Filled 2011-08-08: qty 1

## 2011-08-08 MED ORDER — FAMOTIDINE 20 MG PO TABS: 20.0000 mg | ORAL_TABLET | Freq: Two times a day (BID) | ORAL | Status: DC

## 2011-08-08 NOTE — ED Notes (Signed)
DR Brown at bedside.

## 2011-08-08 NOTE — ED Notes (Signed)
Seen in ED 5/22 Symptoms not improving nausea vomiting tremors "hot sweats" and shortness of breath.  States drinks 12 beers a day and past week has decreased 2 beers one day and today none.

## 2011-08-08 NOTE — ED Provider Notes (Signed)
History     CSN: [id number]  Arrival date & time 08/08/11  1107   First MD Initiated Contact with Patient 08/08/11 1128      Chief Complaint  Patient presents with  . Shortness of Breath  . Tremors    (Consider location/radiation/quality/duration/timing/severity/associated sxs/prior treatment) HPI The patient is a 36 yo man, history of tobacco and alcohol abuse, presenting with subacute cough.  The patient notes a 10-day history of "feeling like someone is choking me", with significant throat discomfort (though without dysphagia), frequent coughing throughout the day, and post-tussive emesis.  The symptoms occasionally cause him dyspnea, and interrupt his sleep at night.  He notes some rhinorrhea and nasal congestion, which he believes is secondary to irritation from vomitus entering nostrils, but no history of allergies.  No fevers, sick contacts, exudative cough.  The patient was seen for this complaint on 5/22, at which time he also had R-sided and epigastric pain, thought to be secondary to gastritis, which has since resolved.  Lipase and CXR at that time were wnl.  He was discharged with zofran and protonix, which he did not fill due to financial constraints.  Past Medical History  Diagnosis Date  . Syncope   . Seizure disorder   . Alcoholism     Past Surgical History  Procedure Date  . Surgery scrotal / testicular     Family History  Problem Relation Age of Onset  . Heart disease Paternal Grandmother     History  Substance Use Topics  . Smoking status: Current Everyday Smoker -- 0.5 packs/day  . Smokeless tobacco: Not on file  . Alcohol Use: Yes      Review of Systems General: no fevers, chills, changes in weight, changes in appetite Skin: no rash HEENT: no blurry vision, hearing changes Pulm: see HPI CV: no chest pain, palpitations, shortness of breath Abd: no abdominal pain, diarrhea/constipation GU: no dysuria, hematuria, polyuria Ext: no arthralgias,  myalgias Neuro: no weakness, numbness, or tingling  Allergies  Ultram  Home Medications   Current Outpatient Rx  Name Route Sig Dispense Refill  . LORAZEPAM PO Oral Take 1 tablet by mouth once.      BP 116/90  Pulse 67  Temp(Src) 98.2 F (36.8 C) (Oral)  Resp 20  SpO2 97%  Physical Exam General: alert, cooperative, hunched over in bed, appears uncomfortable HEENT: PERRL, EOMI, oropharynx and posterior pharynx significantly erythematous with no tonsillar exudate present Neck: supple, no lymphadenopathy, JVD, or carotid bruits Lungs: clear to ascultation bilaterally, normal work of respiration, no wheezes, rales, ronchi Heart: regular rate and rhythm, no murmurs, gallops, or rubs Abdomen: soft, non-tender, non-distended, normal bowel sounds Extremities: no cyanosis, clubbing, or edema Neurologic: alert & oriented X3, cranial nerves II-XII intact, strength grossly intact, sensation intact to light touch  ED Course  Procedures (including critical care time)  Labs Reviewed  CBC - Abnormal; Notable for the following:    MCH 34.2 (*)    MCHC 36.5 (*)    All other components within normal limits   No results found.   No diagnosis found.    MDM   # Cough - the patient notes subacute cough.  Symptoms not quite classic for epiglottitis, but this is certainly a concern.  Other potential causes include chronic rhinosinusitis vs GERD vs URI vs subacute bronchial inflammation. -lateral x-ray neck -zofran -1L NS bolus -if neck x-ray negative, consider discharge on short course of steroids  Addendum 1:30 pm - x-ray shows no epiglottitis.  Will discharge on 8-day prednisone course and pepcid.  Linward Headland, MD 08/08/11 1340

## 2011-08-08 NOTE — Discharge Instructions (Signed)
Your labs and x-rays showed no specific cause for your symptoms.  Your symptoms are most likely due to inflammation in your airways and throat, causing continued cough.  To decrease this inflammation, take Prednisone, 40 mg per day for 4 days, followed by 20 mg per day for 4 days.    Also, we are prescribing Pepcid, to decrease your stomach acid, which can be irritating to your throat.  Take 1 tablet twice per day.

## 2011-08-10 NOTE — ED Provider Notes (Signed)
I  reviewed the resident's note and I agree with the findings and plan.     Nelia Shi, MD 08/10/11 [phone]

## 2011-08-17 ENCOUNTER — Emergency Department (HOSPITAL_COMMUNITY)
Admission: EM | Admit: 2011-08-17 | Discharge: 2011-08-17 | Discharge status: Absent without leave | Payer: Self-pay | Source: Home / Self Care

## 2011-08-17 ENCOUNTER — Emergency Department (HOSPITAL_COMMUNITY)
Admission: EM | Admit: 2011-08-17 | Discharge: 2011-08-17 | Disposition: A | Discharge status: Home / Self Care | Payer: Self-pay | Attending: Emergency Medicine | Admitting: Emergency Medicine

## 2011-08-17 ENCOUNTER — Encounter (HOSPITAL_COMMUNITY): Payer: Self-pay | Admitting: *Deleted

## 2011-08-17 DIAGNOSIS — F17200 Nicotine dependence, unspecified, uncomplicated: Secondary | ICD-10-CM | POA: Insufficient documentation

## 2011-08-17 DIAGNOSIS — K044 Acute apical periodontitis of pulpal origin: Secondary | ICD-10-CM | POA: Insufficient documentation

## 2011-08-17 DIAGNOSIS — G40909 Epilepsy, unspecified, not intractable, without status epilepticus: Secondary | ICD-10-CM | POA: Insufficient documentation

## 2011-08-17 DIAGNOSIS — K047 Periapical abscess without sinus: Secondary | ICD-10-CM

## 2011-08-17 MED ORDER — HYDROMORPHONE HCL PF 2 MG/ML IJ SOLN
2.0000 mg | Freq: Once | INTRAMUSCULAR | Status: AC
  Administered 2011-08-17: 2 mg via INTRAMUSCULAR
  Filled 2011-08-17: qty 1

## 2011-08-17 MED ORDER — AMOXICILLIN 500 MG PO CAPS: 500.0000 mg | ORAL_CAPSULE | Freq: Three times a day (TID) | ORAL | Status: AC

## 2011-08-17 MED ORDER — ONDANSETRON 4 MG PO TBDP
8.0000 mg | ORAL_TABLET | Freq: Once | ORAL | Status: AC
  Administered 2011-08-17: 8 mg via ORAL
  Filled 2011-08-17: qty 2

## 2011-08-17 MED ORDER — OXYCODONE-ACETAMINOPHEN 5-325 MG PO TABS: 1.0000 | ORAL_TABLET | ORAL | Status: AC | PRN

## 2011-08-17 NOTE — ED Notes (Signed)
Reports having facial swelling to right side of face since this am, denies toothache but did have something stuck in his gums x 3 days. Airway is intact but moderate swelling noted.

## 2011-08-17 NOTE — ED Provider Notes (Signed)
History   This chart was scribed for Flint Melter, MD scribed by Magnus Sinning. The patient was seen in room STRE4/STRE4 seen at 15:53.   CSN: [id number]  Arrival date & time 08/17/11  1423   None     Chief Complaint  Patient presents with  . Facial Swelling    (Consider location/radiation/quality/duration/timing/severity/associated sxs/prior treatment) HPI Thomas Atkins is a 36 y.o. male who presents to the Emergency Department complaining of moderate facial swelling concentrated to right cheek with associated constant moderate "burning" pain ,onset this morning after he woke up. Patient reports that he felt fine all day yesterday with no sxs. Denies any other associated sxs and states that he has not used any treatments for pain.  Past Medical History  Diagnosis Date  . Syncope   . Seizure disorder   . Alcoholism     Past Surgical History  Procedure Date  . Surgery scrotal / testicular     Family History  Problem Relation Age of Onset  . Heart disease Paternal Grandmother     History  Substance Use Topics  . Smoking status: Current Everyday Smoker -- 0.5 packs/day  . Smokeless tobacco: Not on file  . Alcohol Use: Yes      Review of Systems 10 Systems reviewed and are negative for acute change except as noted in the HPI. Allergies  Ultram  Home Medications   Current Outpatient Rx  Name Route Sig Dispense Refill  . LORAZEPAM PO Oral Take 1 tablet by mouth once.    Marland Kitchen NAPHAZOLINE HCL 0.012 % OP SOLN Both Eyes Place 2 drops into both eyes 4 (four) times daily as needed. For red eye    . AMOXICILLIN 500 MG PO CAPS Oral Take 1 capsule (500 mg total) by mouth 3 (three) times daily. 21 capsule 0  . OXYCODONE-ACETAMINOPHEN 5-325 MG PO TABS Oral Take 1 tablet by mouth every 4 (four) hours as needed for pain. 15 tablet 0    BP 124/90  Pulse 125  Temp(Src) 98.4 F (36.9 C) (Oral)  Resp 21  SpO2 96%  Physical Exam  Nursing note and vitals  reviewed. Constitutional: He is oriented to person, place, and time. He appears well-developed and well-nourished. No distress.  HENT:  Head: Normocephalic and atraumatic.  Eyes: Conjunctivae and EOM are normal.  Neck: Neck supple. No tracheal deviation present.  Cardiovascular: Normal rate.   Pulmonary/Chest: Effort normal. No respiratory distress.  Musculoskeletal: Normal range of motion.  Neurological: He is alert and oriented to person, place, and time.  Skin: Skin is warm and dry.  Psychiatric: He has a normal mood and affect. His behavior is normal.    ED Course  Procedures (including critical care time) DIAGNOSTIC STUDIES: Oxygen Saturation is 96% on room air, normal by my interpretation.    COORDINATION OF CARE:  INCISION AND DRAINAGE Performed by: Flint Melter Consent: Verbal consent obtained. Risks and benefits: risks, benefits and alternatives were discussed Type: abscess  Body area: right upper buccal gutter  Anesthesia: local infiltration  Local anesthetic: lidocaine 2% with epinephrine  Anesthetic total: 4 ml  Complexity: complex Blunt dissection to break up loculations    Drainage amount: none    Patient tolerance: Patient tolerated the procedure well with no immediate complications.     Labs Reviewed - No data to display No results found.   1. Dental infection       MDM  Apparent dental infection, without plus on I&D attempt. Doubt deep infection.  He has moderate dental decay and needs multiple extractions. He is stable for discharge.   I personally performed the services described in this documentation, which was scribed in my presence. The recorded information has been reviewed and considered.    Plan: Home Medications- Amox. And Percocet; Home Treatments- soft foods; Recommended follow up- Dental Care asap        Flint Melter, MD 08/17/11 [phone]

## 2011-08-17 NOTE — Discharge Instructions (Signed)
Call the dentist listed for an appointment, tomorrow. Return here if needed for problems.  Dental Care and Dentist Visits Dental care supports good overall health. Regular dental visits can also help you avoid dental pain, bleeding, infection, and other more serious health problems in the future. It is important to keep the mouth healthy because diseases in the teeth, gums, and other oral tissues can spread to other areas of the body. Some problems, such as diabetes, heart disease, and pre-term labor have been associated with poor oral health.  See your dentist every 6 months. If you experience emergency problems such as a toothache or broken tooth, go to the dentist right away. If you see your dentist regularly, you may catch problems early. It is easier to be treated for problems in the early stages.  WHAT TO EXPECT AT A DENTIST VISIT  Your dentist will look for many common oral health problems and recommend proper treatment. At your regular dental visit, you can expect:  Gentle cleaning of the teeth and gums. This includes scraping and polishing. This helps to remove the sticky substance around the teeth and gums (plaque). Plaque forms in the mouth shortly after eating. Over time, plaque hardens on the teeth as tartar. If tartar is not removed regularly, it can cause problems. Cleaning also helps remove stains.   Periodic X-rays. These pictures of the teeth and supporting bone will help your dentist assess the health of your teeth.   Periodic fluoride treatments. Fluoride is a natural mineral shown to help strengthen teeth. Fluoride treatmentinvolves applying a fluoride gel or varnish to the teeth. It is most commonly done in children.   Examination of the mouth, tongue, jaws, teeth, and gums to look for any oral health problems, such as:   Cavities (dental caries). This is decay on the tooth caused by plaque, sugar, and acid in the mouth. It is best to catch a cavity when it is small.    Inflammation of the gums caused by plaque buildup (gingivitis).   Problems with the mouth or malformed or misaligned teeth.   Oral cancer or other diseases of the soft tissues or jaws.  KEEP YOUR TEETH AND GUMS HEALTHY For healthy teeth and gums, follow these general guidelines as well as your dentist's specific advice:  Have your teeth professionally cleaned at the dentist every 6 months.   Brush twice daily with a fluoride toothpaste.   Floss your teeth daily.   Ask your dentist if you need fluoride supplements, treatments, or fluoride toothpaste.   Eat a healthy diet. Reduce foods and drinks with added sugar.   Avoid smoking.  TREATMENT FOR ORAL HEALTH PROBLEMS If you have oral health problems, treatment varies depending on the conditions present in your teeth and gums.  Your caregiver will most likely recommend good oral hygiene at each visit.   For cavities, gingivitis, or other oral health disease, your caregiver will perform a procedure to treat the problem. This is typically done at a separate appointment. Sometimes your caregiver will refer you to another dental specialist for specific tooth problems or for surgery.  SEEK IMMEDIATE DENTAL CARE IF:  You have pain, bleeding, or soreness in the gum, tooth, jaw, or mouth area.   A permanent tooth becomes loose or separated from the gum socket.   You experience a blow or injury to the mouth or jaw area.  Document Released: 11/06/2010 Document Revised: 02/13/2011 Document Reviewed: 11/06/2010 [facility] Patient Information 2012 [place], [place].

## 2012-02-07 ENCOUNTER — Emergency Department (HOSPITAL_COMMUNITY): Payer: Self-pay

## 2012-02-07 ENCOUNTER — Encounter (HOSPITAL_COMMUNITY): Payer: Self-pay | Admitting: Emergency Medicine

## 2012-02-07 ENCOUNTER — Emergency Department (HOSPITAL_COMMUNITY)
Admission: EM | Admit: 2012-02-07 | Discharge: 2012-02-07 | Disposition: A | Discharge status: Home / Self Care | Payer: Self-pay | Attending: Emergency Medicine | Admitting: Emergency Medicine

## 2012-02-07 DIAGNOSIS — F10929 Alcohol use, unspecified with intoxication, unspecified: Secondary | ICD-10-CM

## 2012-02-07 DIAGNOSIS — Z8669 Personal history of other diseases of the nervous system and sense organs: Secondary | ICD-10-CM | POA: Insufficient documentation

## 2012-02-07 DIAGNOSIS — R51 Headache: Secondary | ICD-10-CM | POA: Insufficient documentation

## 2012-02-07 DIAGNOSIS — F10229 Alcohol dependence with intoxication, unspecified: Secondary | ICD-10-CM | POA: Insufficient documentation

## 2012-02-07 DIAGNOSIS — F1020 Alcohol dependence, uncomplicated: Secondary | ICD-10-CM

## 2012-02-07 DIAGNOSIS — R079 Chest pain, unspecified: Secondary | ICD-10-CM | POA: Insufficient documentation

## 2012-02-07 DIAGNOSIS — F17200 Nicotine dependence, unspecified, uncomplicated: Secondary | ICD-10-CM | POA: Insufficient documentation

## 2012-02-07 LAB — ETHANOL: Alcohol, Ethyl (B): 312 mg/dL — ABNORMAL HIGH (ref 0–11)

## 2012-02-07 LAB — BLOOD GAS, ARTERIAL
Bicarbonate: 22.8 mEq/L (ref 20.0–24.0)
TCO2: 19.5 mmol/L (ref 0–100)
pCO2 arterial: 37.9 mmHg (ref 35.0–45.0)
pH, Arterial: 7.396 (ref 7.350–7.450)

## 2012-02-07 LAB — BASIC METABOLIC PANEL
BUN: 9 mg/dL (ref 6–23)
Calcium: 9.4 mg/dL (ref 8.4–10.5)
GFR calc non Af Amer: >90 mL/min (ref >90)
Glucose, Bld: 94 mg/dL (ref 70–99)

## 2012-02-07 LAB — CBC
HCT: 44.8 % (ref 39.0–52.0)
Hemoglobin: 15.5 g/dL (ref 13.0–17.0)
MCH: 33.2 pg (ref 26.0–34.0)
MCHC: 34.6 g/dL (ref 30.0–36.0)

## 2012-02-07 LAB — CARBOXYHEMOGLOBIN
Methemoglobin: 1.2 % (ref 0.0–1.5)
Total hemoglobin: 16.1 g/dL (ref 13.5–18.0)

## 2012-02-07 NOTE — ED Provider Notes (Addendum)
History     CSN: [id number]  Arrival date & time 02/07/12  1827   First MD Initiated Contact with Patient 02/07/12 2044      Chief Complaint  Patient presents with  . Chest Pain  . Headache    (Consider location/radiation/quality/duration/timing/severity/associated sxs/prior treatment) HPI Comments: Thomas Atkins is a 36 y.o. Male who presents for evaluation of headache, chest pain, and left arm pain. He also states that his eyes flicker and are blurred. He has been drinking alcohol heavily for 20 days. He is concerned about a furnace in his home,  causing his headache. He denies head trauma, nausea, vomiting, fever, or chills, back pain, weakness, or dizziness. There are no modifying factors.  Patient is a 36 y.o. male presenting with chest pain and headaches. The history is provided by the patient.  Chest Pain    Headache     Past Medical History  Diagnosis Date  . Syncope   . Seizure disorder   . Alcoholism     Past Surgical History  Procedure Date  . Surgery scrotal / testicular     Family History  Problem Relation Age of Onset  . Heart disease Paternal Grandmother     History  Substance Use Topics  . Smoking status: Current Every Day Smoker -- 0.5 packs/day  . Smokeless tobacco: Not on file  . Alcohol Use: 7.2 oz/week    12 Cans of beer per week      Review of Systems  Cardiovascular: Positive for chest pain.  Neurological: Positive for headaches.  All other systems reviewed and are negative.    Allergies  Ultram  Home Medications   Current Outpatient Rx  Name  Route  Sig  Dispense  Refill  . GUAIFENESIN ER 600 MG PO TB12   Oral   Take 600 mg by mouth daily.           BP 121/86  Pulse 93  Temp 98.6 F (37 C) (Oral)  Resp 18  SpO2 96%  Physical Exam  Nursing note and vitals reviewed. Constitutional: He is oriented to person, place, and time. He appears well-developed and well-nourished. No distress.  HENT:  Head: Normocephalic  and atraumatic.  Right Ear: External ear normal.  Left Ear: External ear normal.  Eyes: Conjunctivae normal and EOM are normal. Pupils are equal, round, and reactive to light.  Neck: Normal range of motion and phonation normal. Neck supple.  Cardiovascular: Normal rate, regular rhythm, normal heart sounds and intact distal pulses.   Pulmonary/Chest: Effort normal and breath sounds normal. He exhibits no bony tenderness.  Abdominal: Soft. Normal appearance. There is no tenderness.  Musculoskeletal: Normal range of motion. He exhibits no tenderness.  Neurological: He is alert and oriented to person, place, and time. He has normal strength. No cranial nerve deficit or sensory deficit. He exhibits normal muscle tone. Coordination (Mild dysmetria) abnormal.       Dysarthria consistent with alcohol intoxication  Skin: Skin is warm, dry and intact.  Psychiatric: He has a normal mood and affect. His behavior is normal. Judgment and thought content normal.    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  CHEST - 2 VIEW  Comparison: 07/30/2011  Findings:  The heart size and  mediastinal contours are within normal limits.  Both lungs are clear.  The visualized skeletal structures show stable pectus excavatum deformity.  IMPRESSION: No active disease.   Original Report Authenticated By: Irish Lack, M.D.    Ct Head Wo Contrast  02/07/2012  *RADIOLOGY REPORT*  Clinical Data: Headache and visual changes.  CT HEAD WITHOUT CONTRAST  Technique:  Contiguous axial images were obtained from the base of the skull through the vertex without contrast.  Comparison: None.  Findings: The brain demonstrates no evidence of hemorrhage, infarction, edema, mass effect, extra-axial fluid collection, hydrocephalus or mass lesion.  The skull is unremarkable.  IMPRESSION: Normal head CT.   Original Report Authenticated By: Irish Lack, M.D.      1. Headache   2. Chest pain   3. Alcohol intoxication   4. Alcoholism       MDM  Nonspecific headache, with alcohol intoxication. No evidence for carbon monoxide toxicity, head injury, ACS, or metabolic instability. Carboxyhemoglobin is 5%, consistent with his smoking history. Patient  will have a family member pick him up to take him home        Flint Melter, MD 02/07/12 2310  Flint Melter, MD 02/07/12 [phone]

## 2012-02-07 NOTE — ED Notes (Signed)
Patient reports that he has chest pain and left arm pain. The patient also reports that he is SOB. Reports that he has drank a 12 pack of beer today. Mother states that he has been on a "20 day bender". The patient is alert and oriented

## 2012-02-07 NOTE — ED Notes (Signed)
Patient returned from X-ray 

## 2012-07-20 ENCOUNTER — Encounter (HOSPITAL_COMMUNITY): Payer: Self-pay | Admitting: Emergency Medicine

## 2012-07-20 ENCOUNTER — Emergency Department (HOSPITAL_COMMUNITY)
Admission: EM | Admit: 2012-07-20 | Discharge: 2012-07-21 | Disposition: A | Discharge status: Home / Self Care | Payer: Self-pay | Attending: Emergency Medicine | Admitting: Emergency Medicine

## 2012-07-20 DIAGNOSIS — F1020 Alcohol dependence, uncomplicated: Secondary | ICD-10-CM | POA: Insufficient documentation

## 2012-07-20 DIAGNOSIS — F1210 Cannabis abuse, uncomplicated: Secondary | ICD-10-CM | POA: Insufficient documentation

## 2012-07-20 DIAGNOSIS — F1010 Alcohol abuse, uncomplicated: Secondary | ICD-10-CM

## 2012-07-20 DIAGNOSIS — Z8669 Personal history of other diseases of the nervous system and sense organs: Secondary | ICD-10-CM | POA: Insufficient documentation

## 2012-07-20 DIAGNOSIS — F17200 Nicotine dependence, unspecified, uncomplicated: Secondary | ICD-10-CM | POA: Insufficient documentation

## 2012-07-20 LAB — SALICYLATE LEVEL: Salicylate Lvl: <2 mg/dL — ABNORMAL LOW (ref 2.8–20.0)

## 2012-07-20 LAB — RAPID URINE DRUG SCREEN, HOSP PERFORMED
Cocaine: NOT DETECTED
Opiates: NOT DETECTED

## 2012-07-20 LAB — CBC
Hemoglobin: 16.1 g/dL (ref 13.0–17.0)
MCH: 34.4 pg — ABNORMAL HIGH (ref 26.0–34.0)
MCHC: 35.4 g/dL (ref 30.0–36.0)
MCV: 97.2 fL (ref 78.0–100.0)

## 2012-07-20 LAB — COMPREHENSIVE METABOLIC PANEL
ALT: 116 U/L — ABNORMAL HIGH (ref 0–53)
BUN: 9 mg/dL (ref 6–23)
Calcium: 9.5 mg/dL (ref 8.4–10.5)
Creatinine, Ser: 0.78 mg/dL (ref 0.50–1.35)
GFR calc Af Amer: >90 mL/min (ref >90)
GFR calc non Af Amer: >90 mL/min (ref >90)
Glucose, Bld: 94 mg/dL (ref 70–99)
Sodium: 143 mEq/L (ref 135–145)
Total Protein: 7.8 g/dL (ref 6.0–8.3)

## 2012-07-20 LAB — ETHANOL: Alcohol, Ethyl (B): 296 mg/dL — ABNORMAL HIGH (ref 0–11)

## 2012-07-20 MED ORDER — ZOLPIDEM TARTRATE 5 MG PO TABS: 5.0000 mg | ORAL_TABLET | Freq: Every evening | ORAL | Status: DC | PRN

## 2012-07-20 MED ORDER — ADULT MULTIVITAMIN W/MINERALS CH
1.0000 | ORAL_TABLET | Freq: Every day | ORAL | Status: DC
  Administered 2012-07-21: 1 via ORAL
  Filled 2012-07-20: qty 1

## 2012-07-20 MED ORDER — VITAMIN B-1 100 MG PO TABS
100.0000 mg | ORAL_TABLET | Freq: Every day | ORAL | Status: DC
  Administered 2012-07-21: 100 mg via ORAL
  Filled 2012-07-20: qty 1

## 2012-07-20 MED ORDER — ALUM & MAG HYDROXIDE-SIMETH 200-200-20 MG/5ML PO SUSP: 30.0000 mL | ORAL | Status: DC | PRN

## 2012-07-20 MED ORDER — LORAZEPAM 1 MG PO TABS: 0.0000 mg | ORAL_TABLET | Freq: Two times a day (BID) | ORAL | Status: DC

## 2012-07-20 MED ORDER — LORAZEPAM 1 MG PO TABS
1.0000 mg | ORAL_TABLET | Freq: Four times a day (QID) | ORAL | Status: DC | PRN
  Administered 2012-07-21: 1 mg via ORAL

## 2012-07-20 MED ORDER — IBUPROFEN 400 MG PO TABS: 600.0000 mg | ORAL_TABLET | Freq: Three times a day (TID) | ORAL | Status: DC | PRN

## 2012-07-20 MED ORDER — ACETAMINOPHEN 325 MG PO TABS: 650.0000 mg | ORAL_TABLET | ORAL | Status: DC | PRN

## 2012-07-20 MED ORDER — THIAMINE HCL 100 MG/ML IJ SOLN: 100.0000 mg | Freq: Every day | INTRAMUSCULAR | Status: DC

## 2012-07-20 MED ORDER — LORAZEPAM 1 MG PO TABS
0.0000 mg | ORAL_TABLET | Freq: Four times a day (QID) | ORAL | Status: DC
  Administered 2012-07-21: 2 mg via ORAL
  Filled 2012-07-20: qty 2

## 2012-07-20 MED ORDER — ONDANSETRON HCL 4 MG PO TABS
4.0000 mg | ORAL_TABLET | Freq: Three times a day (TID) | ORAL | Status: DC | PRN
  Administered 2012-07-21: 4 mg via ORAL
  Filled 2012-07-20 (×2): qty 1

## 2012-07-20 MED ORDER — FOLIC ACID 1 MG PO TABS
1.0000 mg | ORAL_TABLET | Freq: Every day | ORAL | Status: DC
  Administered 2012-07-21: 1 mg via ORAL
  Filled 2012-07-20: qty 1

## 2012-07-20 MED ORDER — NICOTINE 21 MG/24HR TD PT24
21.0000 mg | MEDICATED_PATCH | Freq: Every day | TRANSDERMAL | Status: DC
  Administered 2012-07-20 – 2012-07-21 (×2): 21 mg via TRANSDERMAL
  Filled 2012-07-20 (×2): qty 1

## 2012-07-20 MED ORDER — LORAZEPAM 1 MG PO TABS
1.0000 mg | ORAL_TABLET | Freq: Three times a day (TID) | ORAL | Status: DC | PRN
  Administered 2012-07-20 – 2012-07-21 (×2): 1 mg via ORAL
  Filled 2012-07-20 (×2): qty 1
  Filled 2012-07-20: qty 2

## 2012-07-20 MED ORDER — LORAZEPAM 2 MG/ML IJ SOLN: 1.0000 mg | Freq: Four times a day (QID) | INTRAMUSCULAR | Status: DC | PRN

## 2012-07-20 NOTE — ED Notes (Signed)
Clothes inventoried and placed in a bind.  Papers on chart

## 2012-07-20 NOTE — ED Provider Notes (Signed)
History  This chart was scribed for non-physician practitioner Junius Finner, PA-C working with Loren Racer, MD, by Candelaria Stagers, ED Scribe. This patient was seen in room TR09C/TR09C and the patient's care was started at 5:12 PM    CSN: [id number]  Arrival date & time 07/20/12  1605   First MD Initiated Contact with Patient 07/20/12 1611      Chief Complaint  Patient presents with  . Medical Clearance     The history is provided by the patient. No language interpreter was used.   HPI Comments: Thomas Atkins is a 37 y.o. male who presents to the Emergency Department seeking detox from alcohol.  Pt reports that he drinks about one case of beer per day with his last usage earlier today of 65oz of beer.  Pt also reports marijuana use.  Pt has gone through out patient treatment before one year ago at [facility] in [place], [place] which he reports was not helpful.  Stayed for 3 days then wanted to try home rehab. Pt denies SI/HI.  Pt smokes.  Pt reports experiencing depression, but does not take medications for this.  Pt also reports experiencing chronic intermittent abdominal pain.  Pt has h/o seizures associated with alcohol withdrawal.  Denies significant medical hx besides seizures.  No heart, lung, or abdominal problems.  No pain at this time.  Denies fever, n/v/d.     Past Medical History  Diagnosis Date  . Syncope   . Seizure disorder   . Alcoholism     Past Surgical History  Procedure Laterality Date  . Surgery scrotal / testicular      Family History  Problem Relation Age of Onset  . Heart disease Paternal Grandmother     History  Substance Use Topics  . Smoking status: Current Every Day Smoker -- 0.50 packs/day  . Smokeless tobacco: Not on file  . Alcohol Use: 7.2 oz/week    12 Cans of beer per week      Review of Systems  Constitutional: Negative for fever and chills.  Psychiatric/Behavioral: Negative for suicidal ideas and self-injury.  All other systems  reviewed and are negative.    Allergies  Ultram  Home Medications  No current outpatient prescriptions on file.  BP 130/99  Pulse 103  Temp(Src) 98.6 F (37 C) (Oral)  Resp 16  SpO2 95%  Physical Exam  Nursing note and vitals reviewed. Constitutional: He is oriented to person, place, and time. He appears well-developed and well-nourished. No distress.  HENT:  Head: Normocephalic and atraumatic.  Eyes: Conjunctivae and EOM are normal. Pupils are equal, round, and reactive to light. Right eye exhibits no discharge. Left eye exhibits no discharge. No scleral icterus.  Neck: Normal range of motion. Neck supple. No tracheal deviation present.  Cardiovascular: Normal rate, regular rhythm and normal heart sounds.   Pulmonary/Chest: Effort normal. No respiratory distress.  Abdominal: Soft. Bowel sounds are normal. He exhibits no distension and no mass. There is tenderness ( mid abdomin  ). There is no rebound and no guarding.  Musculoskeletal: Normal range of motion.  Neurological: He is alert and oriented to person, place, and time.  Skin: Skin is warm and dry.  Psychiatric: He has a normal mood and affect. His behavior is normal.    ED Course  Procedures   DIAGNOSTIC STUDIES: Oxygen Saturation is 95% on room air, normal by my interpretation.    COORDINATION OF CARE:  5:15 PM Discussed course of care with pt which includes  medical clearance.  Pt understands and agrees.    Labs Reviewed  CBC - Abnormal; Notable for the following:    MCH 34.4 (*)    All other components within normal limits  COMPREHENSIVE METABOLIC PANEL - Abnormal; Notable for the following:    AST 112 (*)    ALT 116 (*)    All other components within normal limits  ETHANOL - Abnormal; Notable for the following:    Alcohol, Ethyl (B) 296 (*)    All other components within normal limits  SALICYLATE LEVEL - Abnormal; Notable for the following:    Salicylate Lvl <2.0 (*)    All other components within  normal limits  ACETAMINOPHEN LEVEL  URINE RAPID DRUG SCREEN (HOSP PERFORMED)  ETHANOL   No results found.   1. Alcohol abuse       MDM  Pt requesting detox from [facility] not SI/HI.  Wants detox from alcohol, beer is his preference.  Was in [facility] in [place] last year.  Willing to try again.  Pt is medically cleared.  Consulted ACT who agreed to see patient.  Vitals: unremarkable. Discharged in stable condition.    Discussed pt with attending during ED encounter.         Junius Finner, PA-C 07/21/12 [phone]

## 2012-07-20 NOTE — ED Notes (Signed)
Pt requesting detox from ETOH; pt sts drinks a case of beer a day; pt sts last drank today

## 2012-07-20 NOTE — ED Notes (Signed)
Pt moved from fast track to room 25.  The pt is drowsy and he reports that he was given med in the other area and when he wakes up he is going to need more med.  His last alcohol was just pta
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
## 2012-07-21 NOTE — ED Notes (Signed)
Pt. Called his mom , will be picking pt. Up around 12:30

## 2012-07-21 NOTE — ED Provider Notes (Signed)
Patient accepted to [facility]. Denies tremors. BP 129/87  Pulse 86  Temp(Src) 97.8 F (36.6 C) (Oral)  Resp 18  SpO2 92%   [name], MD 07/21/12 [phone]

## 2012-07-21 NOTE — ED Notes (Signed)
[facility] called and informed that patient is leaving the department with mother at this time

## 2012-07-21 NOTE — BH Assessment (Signed)
[facility] Assessment Progress Note   Pt has been accepted to [facility] by Cory Munch there.  When patient's mother picks him up for transport ED nurse needs to call [facility] and let them know that patient has left.  [facility] phone is [phone].

## 2012-07-21 NOTE — ED Notes (Signed)
Pt. oob to the bathroom, gait steady.  Pt. Does feel anxious.  Dr. Manus Gunning at the bedside.

## 2012-07-21 NOTE — ED Notes (Signed)
Downtime completed. Please refer to Paper chart for information re: this Pt. during that time.

## 2012-07-22 NOTE — ED Provider Notes (Signed)
Medical screening examination/treatment/procedure(s) were performed by non-physician practitioner and as supervising physician I was immediately available for consultation/collaboration.   Emmette Katt, MD 07/22/12 0454 

## 2012-11-23 ENCOUNTER — Emergency Department (HOSPITAL_COMMUNITY)
Admission: EM | Admit: 2012-11-23 | Discharge: 2012-11-23 | Disposition: A | Discharge status: Home / Self Care | Payer: No Typology Code available for payment source | Attending: Emergency Medicine | Admitting: Emergency Medicine

## 2012-11-23 ENCOUNTER — Encounter (HOSPITAL_COMMUNITY): Payer: Self-pay | Admitting: Emergency Medicine

## 2012-11-23 DIAGNOSIS — F17200 Nicotine dependence, unspecified, uncomplicated: Secondary | ICD-10-CM | POA: Insufficient documentation

## 2012-11-23 DIAGNOSIS — R110 Nausea: Secondary | ICD-10-CM | POA: Insufficient documentation

## 2012-11-23 DIAGNOSIS — Z8669 Personal history of other diseases of the nervous system and sense organs: Secondary | ICD-10-CM | POA: Insufficient documentation

## 2012-11-23 DIAGNOSIS — R259 Unspecified abnormal involuntary movements: Secondary | ICD-10-CM | POA: Insufficient documentation

## 2012-11-23 DIAGNOSIS — F1010 Alcohol abuse, uncomplicated: Secondary | ICD-10-CM | POA: Insufficient documentation

## 2012-11-23 LAB — CBC
HCT: 45.2 % (ref 39.0–52.0)
MCH: 34.4 pg — ABNORMAL HIGH (ref 26.0–34.0)
MCHC: 36.7 g/dL — ABNORMAL HIGH (ref 30.0–36.0)
RDW: 13.1 % (ref 11.5–15.5)

## 2012-11-23 LAB — COMPREHENSIVE METABOLIC PANEL
Albumin: 4.4 g/dL (ref 3.5–5.2)
Alkaline Phosphatase: 68 U/L (ref 39–117)
BUN: 12 mg/dL (ref 6–23)
Calcium: 10.4 mg/dL (ref 8.4–10.5)
Creatinine, Ser: 0.76 mg/dL (ref 0.50–1.35)
GFR calc Af Amer: >90 mL/min (ref >90)
Glucose, Bld: 139 mg/dL — ABNORMAL HIGH (ref 70–99)
Potassium: 3.9 mEq/L (ref 3.5–5.1)
Total Protein: 8 g/dL (ref 6.0–8.3)

## 2012-11-23 LAB — SALICYLATE LEVEL: Salicylate Lvl: <2 mg/dL — ABNORMAL LOW (ref 2.8–20.0)

## 2012-11-23 LAB — ETHANOL: Alcohol, Ethyl (B): <11 mg/dL (ref 0–11)

## 2012-11-23 LAB — ACETAMINOPHEN LEVEL: Acetaminophen (Tylenol), Serum: <15 ug/mL (ref 10–30)

## 2012-11-23 MED ORDER — LORAZEPAM 1 MG PO TABS
1.0000 mg | ORAL_TABLET | Freq: Once | ORAL | Status: AC
  Administered 2012-11-23: 1 mg via ORAL
  Filled 2012-11-23: qty 1

## 2012-11-23 MED ORDER — LORAZEPAM 1 MG PO TABS: 1.0000 mg | ORAL_TABLET | Freq: Four times a day (QID) | ORAL | Status: DC | PRN

## 2012-11-23 NOTE — ED Notes (Addendum)
Pt brought in due to he is trying to detox himself from etoh since mon and states he has family services and AA that he is in and just wants some medication help for the shaking symptoms. States that he has been through this before and was able to do it at home. Pt does not want to do inpatient treatment denies any SI/HI shaking minimal , calm, dry

## 2012-11-23 NOTE — Progress Notes (Signed)
[facility] provided pt with a [facility] orange card application, highlighting [facility].  °

## 2012-11-23 NOTE — ED Provider Notes (Signed)
CSN: [id number]     Arrival date & time 11/23/12  1206 History   First MD Initiated Contact with Patient 11/23/12 1305     Chief Complaint  Patient presents with  . Medication Refill   (Consider location/radiation/quality/duration/timing/severity/associated sxs/prior Treatment) HPI Comments: Patient presents today voluntarily today requesting detox from alcohol.  He states that he does not want inpatient detox, but would like a prescription for Ativan and an antiemetic.  He states that he has successfully completed outpatient detox with these medications in the past.  He denies prior history of DT's.  He reports that his last drink was approximately 19 hours ago.  He typically drinks 18 beers a day.  He does not drink any liquor.  He reports mild tremor and nausea, but no other symptoms at this time.  He denies SI or HI.  The history is provided by the patient.    Past Medical History  Diagnosis Date  . Syncope   . Seizure disorder   . Alcoholism    Past Surgical History  Procedure Laterality Date  . Surgery scrotal / testicular     Family History  Problem Relation Age of Onset  . Heart disease Paternal Grandmother    History  Substance Use Topics  . Smoking status: Current Every Day Smoker -- 0.50 packs/day  . Smokeless tobacco: Not on file  . Alcohol Use: 7.2 oz/week    12 Cans of beer per week    Review of Systems  Gastrointestinal: Positive for nausea. Negative for vomiting.  Neurological: Positive for tremors.  All other systems reviewed and are negative.    Allergies  Ultram  Home Medications   Current Outpatient Rx  Name  Route  Sig  Dispense  Refill  . aspirin 325 MG tablet   Oral   Take 325 mg by mouth every 4 (four) hours as needed for pain.         Marland Kitchen loratadine (CLARITIN) 10 MG tablet   Oral   Take 10 mg by mouth daily as needed for allergies.          BP 133/99  Pulse 99  Temp(Src) 98 F (36.7 C)  Resp 24  SpO2 99% Physical Exam   Nursing note and vitals reviewed. Constitutional: He appears well-developed and well-nourished.  HENT:  Head: Normocephalic and atraumatic.  Mouth/Throat: Oropharynx is clear and moist.  Eyes: EOM are normal. Pupils are equal, round, and reactive to light.  Cardiovascular: Normal rate, regular rhythm and normal heart sounds.   Pulmonary/Chest: Effort normal and breath sounds normal.  Neurological: He is alert.  Skin: Skin is warm and dry.  Psychiatric: He has a normal mood and affect. He expresses no homicidal and no suicidal ideation.    ED Course  Procedures (including critical care time) Labs Review Labs Reviewed  CBC - Abnormal; Notable for the following:    MCH 34.4 (*)    MCHC 36.7 (*)    All other components within normal limits  COMPREHENSIVE METABOLIC PANEL - Abnormal; Notable for the following:    Sodium 133 (*)    Glucose, Bld 139 (*)    All other components within normal limits  SALICYLATE LEVEL - Abnormal; Notable for the following:    Salicylate Lvl <2.0 (*)    All other components within normal limits  ACETAMINOPHEN LEVEL  ETHANOL  URINE RAPID DRUG SCREEN (HOSP PERFORMED)  URINALYSIS, ROUTINE W REFLEX MICROSCOPIC   Imaging Review No results found.  MDM  No diagnosis  found. Patient presents today requesting detox from alcohol.  He states that he does not want inpatient detox and would like a prescription for Ativan.  He reports that he has successfully done outpatient detox in the past with Ativan.  He denies HI or SI.  Patient stable for discharge.  Return precautions discussed.    Pascal Lux [place], PA-C 11/24/12 1015  Pascal Lux [place], PA-C 11/24/12 1015

## 2012-11-24 NOTE — ED Provider Notes (Signed)
Medical screening examination/treatment/procedure(s) were performed by non-physician practitioner and as supervising physician I was immediately available for consultation/collaboration.    Vida Roller, MD 11/24/12 [phone]

## 2013-01-23 ENCOUNTER — Encounter (HOSPITAL_COMMUNITY): Payer: Self-pay | Admitting: Emergency Medicine

## 2013-01-23 ENCOUNTER — Emergency Department (HOSPITAL_COMMUNITY): Payer: No Typology Code available for payment source

## 2013-01-23 ENCOUNTER — Emergency Department (HOSPITAL_COMMUNITY)
Admission: EM | Admit: 2013-01-23 | Discharge: 2013-01-23 | Disposition: A | Discharge status: Home / Self Care | Payer: No Typology Code available for payment source | Attending: Emergency Medicine | Admitting: Emergency Medicine

## 2013-01-23 DIAGNOSIS — R61 Generalized hyperhidrosis: Secondary | ICD-10-CM | POA: Insufficient documentation

## 2013-01-23 DIAGNOSIS — R0602 Shortness of breath: Secondary | ICD-10-CM | POA: Insufficient documentation

## 2013-01-23 DIAGNOSIS — F17200 Nicotine dependence, unspecified, uncomplicated: Secondary | ICD-10-CM | POA: Insufficient documentation

## 2013-01-23 DIAGNOSIS — K2920 Alcoholic gastritis without bleeding: Secondary | ICD-10-CM

## 2013-01-23 DIAGNOSIS — Z7982 Long term (current) use of aspirin: Secondary | ICD-10-CM | POA: Insufficient documentation

## 2013-01-23 DIAGNOSIS — Z8669 Personal history of other diseases of the nervous system and sense organs: Secondary | ICD-10-CM | POA: Insufficient documentation

## 2013-01-23 DIAGNOSIS — F1021 Alcohol dependence, in remission: Secondary | ICD-10-CM | POA: Insufficient documentation

## 2013-01-23 LAB — COMPREHENSIVE METABOLIC PANEL
Albumin: 4.6 g/dL (ref 3.5–5.2)
BUN: 7 mg/dL (ref 6–23)
CO2: 23 mEq/L (ref 19–32)
Chloride: 102 mEq/L (ref 96–112)
Creatinine, Ser: 0.82 mg/dL (ref 0.50–1.35)
GFR calc non Af Amer: >90 mL/min (ref >90)
Total Bilirubin: 0.3 mg/dL (ref 0.3–1.2)

## 2013-01-23 LAB — CBC
HCT: 43.2 % (ref 39.0–52.0)
MCV: 95.6 fL (ref 78.0–100.0)
RDW: 13.6 % (ref 11.5–15.5)
WBC: 9.6 10*3/uL (ref 4.0–10.5)

## 2013-01-23 LAB — LIPASE, BLOOD: Lipase: 57 U/L (ref 11–59)

## 2013-01-23 LAB — TROPONIN I: Troponin I: <0.3 ng/mL (ref <0.30)

## 2013-01-23 LAB — ETHANOL: Alcohol, Ethyl (B): 405 mg/dL (ref 0–11)

## 2013-01-23 MED ORDER — GI COCKTAIL ~~LOC~~
ORAL | Status: AC
  Filled 2013-01-23: qty 30

## 2013-01-23 MED ORDER — GI COCKTAIL ~~LOC~~: 30.0000 mL | Freq: Once | ORAL | Status: DC

## 2013-01-23 MED ORDER — CHLORDIAZEPOXIDE HCL 25 MG PO CAPS: 50.0000 mg | ORAL_CAPSULE | Freq: Four times a day (QID) | ORAL | Status: DC

## 2013-01-23 NOTE — ED Notes (Signed)
Pt states his ribs hurt and chest hurt with breathing unable to catch breath. Dry Cough progressing x 3 days along with pain in chest.

## 2013-01-23 NOTE — ED Notes (Signed)
MD at bedside. 

## 2013-01-23 NOTE — ED Notes (Signed)
Per EMS pt states he has dyspnea and RUQ pain and pain with breathing. Pt states he was here 2 weeks ago for same s/s. Pt has tenderness in RUQ, Denies trauma, discoloration, crepitus. Room air saturation is 98%. He has an occasional dry cough with blood tinge sputum. Denies h/o TB. Admits to night sweats. Was given Valium by friend for insomia. Pt has strong odor of ETOH.   BP: 170/90 P:120 RR:18 SPO2: 98 Rhythm: ST

## 2013-01-23 NOTE — ED Provider Notes (Signed)
CSN: [id number]     Arrival date & time 01/23/13  1917 History   First MD Initiated Contact with Patient 01/23/13 1935     No chief complaint on file.  (Consider location/radiation/quality/duration/timing/severity/associated sxs/prior Treatment) HPI  37 y/o male here with 3-4 days of dyspnea and chest pain. States that his dyspnea came on gradually about 3 or 4 days ago without inciting event. Is not worsened with exertion or helped by rest. He describes his chest pain is right-sided mid chest pain sharp extending down to his right upper quadrant. Worsen with deep inspiration or cough. Has a history of alcohol abuse and admits to drinking 24 beers daily. He denies fevers but endorses profuse sweating at night. He has pectus excavatum and denies any history of chest pain with this.   Past Medical History  Diagnosis Date  . Syncope   . Seizure disorder   . Alcoholism    Past Surgical History  Procedure Laterality Date  . Surgery scrotal / testicular     Family History  Problem Relation Age of Onset  . Heart disease Paternal Grandmother    History  Substance Use Topics  . Smoking status: Current Every Day Smoker -- 0.50 packs/day  . Smokeless tobacco: Not on file  . Alcohol Use: 14.4 oz/week    24 Cans of beer per week     Comment: drinks case beer daily    Review of Systems  Constitutional: Positive for diaphoresis. Negative for fever and chills.  HENT: Negative for trouble swallowing.   Eyes: Negative for visual disturbance.  Respiratory: Positive for shortness of breath. Negative for cough.   Gastrointestinal: Positive for abdominal pain. Negative for nausea, vomiting and diarrhea.  Genitourinary: Negative for dysuria.  Musculoskeletal: Negative for back pain.  Skin: Negative for rash.  Neurological: Negative for headaches.  Psychiatric/Behavioral: Negative for confusion.    Allergies  Ultram  Home Medications   Current Outpatient Rx  Name  Route  Sig  Dispense   Refill  . aspirin 325 MG tablet   Oral   Take 325 mg by mouth every 4 (four) hours as needed for pain.         Marland Kitchen loratadine (CLARITIN) 10 MG tablet   Oral   Take 10 mg by mouth daily as needed for allergies.         Marland Kitchen LORazepam (ATIVAN) 1 MG tablet   Oral   Take 1 tablet (1 mg total) by mouth every 6 (six) hours as needed for anxiety.   20 tablet   0    BP 114/81  Pulse 82  Temp(Src) 97.9 F (36.6 C) (Oral)  Resp 16  SpO2 99% Physical Exam  Nursing note and vitals reviewed. Constitutional: He is oriented to person, place, and time. He appears well-developed and well-nourished. No distress.  HENT:  Head: Normocephalic and atraumatic.  Eyes: EOM are normal. Pupils are equal, round, and reactive to light.  Neck: Normal range of motion. Neck supple.  Cardiovascular: Normal rate, regular rhythm and normal heart sounds.   Pulmonary/Chest: Effort normal. No respiratory distress. He has no wheezes. He exhibits tenderness. He exhibits no mass.    Breath sounds diminished throughout with some scattered rhonchi.   Abdominal: Soft. Bowel sounds are normal. There is no tenderness.  Musculoskeletal: He exhibits no edema.  Neurological: He is alert and oriented to person, place, and time.  Skin: Skin is warm and dry. He is not diaphoretic.  Psychiatric: He has a normal mood and  affect.    ED Course  Procedures (including critical care time) Labs Review Labs Reviewed - No data to display Imaging Review No results found.  EKG Interpretation   None       MDM  No diagnosis found.  37 y/o mle with Hx of alcoholism with R reproducible chest pain and RUQ abd pain. Chest pain unlikely to be cardiac as it is reproducible and tropnonin neg. Other labs significant for slightly elevated LFTs, normal lipase, and EtOH of 405. 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 DIscussed that treatment is stopping drinking alcohol.    Offered Detox, patient  declines Discussed that tretament is quitting drinking Discharged home with librium to prevent DTs, red flags reviewed, return for worsening symptoms.       Elenora Gamma, MD 01/23/13 [phone]

## 2013-01-23 NOTE — ED Notes (Signed)
Bed: ZO10 Expected date:  Expected time:  Means of arrival:  Comments: EMS: RUQ Pain, Chest wall pain, SOB total x 2 days

## 2013-01-27 NOTE — ED Provider Notes (Signed)
I saw and evaluated the patient, reviewed the resident's note and I agree with the findings and plan.   .Face to face Exam:  General:  Awake HEENT:  Atraumatic Resp:  Normal effort Abd:  Nondistended Neuro:No focal weakness  Resa Rinks L Susannah Carbin, MD 01/27/13 0719 

## 2013-02-15 ENCOUNTER — Encounter (HOSPITAL_COMMUNITY): Payer: Self-pay | Admitting: Emergency Medicine

## 2013-02-15 ENCOUNTER — Emergency Department (HOSPITAL_COMMUNITY)
Admission: EM | Admit: 2013-02-15 | Discharge: 2013-02-16 | Disposition: A | Discharge status: Other Acute Inpatient Hospital | Payer: Federal, State, Local not specified - Other | Attending: Emergency Medicine | Admitting: Emergency Medicine

## 2013-02-15 DIAGNOSIS — F1010 Alcohol abuse, uncomplicated: Secondary | ICD-10-CM | POA: Diagnosis present

## 2013-02-15 DIAGNOSIS — F32A Depression, unspecified: Secondary | ICD-10-CM

## 2013-02-15 DIAGNOSIS — Z79899 Other long term (current) drug therapy: Secondary | ICD-10-CM | POA: Insufficient documentation

## 2013-02-15 DIAGNOSIS — F10239 Alcohol dependence with withdrawal, unspecified: Secondary | ICD-10-CM

## 2013-02-15 DIAGNOSIS — R61 Generalized hyperhidrosis: Secondary | ICD-10-CM | POA: Insufficient documentation

## 2013-02-15 DIAGNOSIS — F1020 Alcohol dependence, uncomplicated: Secondary | ICD-10-CM

## 2013-02-15 DIAGNOSIS — F17200 Nicotine dependence, unspecified, uncomplicated: Secondary | ICD-10-CM | POA: Insufficient documentation

## 2013-02-15 DIAGNOSIS — F10939 Alcohol use, unspecified with withdrawal, unspecified: Secondary | ICD-10-CM

## 2013-02-15 DIAGNOSIS — Z8669 Personal history of other diseases of the nervous system and sense organs: Secondary | ICD-10-CM | POA: Insufficient documentation

## 2013-02-15 DIAGNOSIS — F10229 Alcohol dependence with intoxication, unspecified: Secondary | ICD-10-CM | POA: Insufficient documentation

## 2013-02-15 DIAGNOSIS — F10929 Alcohol use, unspecified with intoxication, unspecified: Secondary | ICD-10-CM

## 2013-02-15 DIAGNOSIS — R109 Unspecified abdominal pain: Secondary | ICD-10-CM | POA: Insufficient documentation

## 2013-02-15 DIAGNOSIS — F329 Major depressive disorder, single episode, unspecified: Secondary | ICD-10-CM | POA: Diagnosis present

## 2013-02-15 DIAGNOSIS — R6883 Chills (without fever): Secondary | ICD-10-CM | POA: Insufficient documentation

## 2013-02-15 DIAGNOSIS — R197 Diarrhea, unspecified: Secondary | ICD-10-CM | POA: Insufficient documentation

## 2013-02-15 LAB — CBC WITH DIFFERENTIAL/PLATELET
Basophils Absolute: 0.1 10*3/uL (ref 0.0–0.1)
HCT: 40.5 % (ref 39.0–52.0)
Hemoglobin: 14.5 g/dL (ref 13.0–17.0)
Lymphocytes Relative: 43 % (ref 12–46)
Monocytes Absolute: 0.6 10*3/uL (ref 0.1–1.0)
Monocytes Relative: 9 % (ref 3–12)
Neutro Abs: 2.8 10*3/uL (ref 1.7–7.7)
RBC: 4.2 MIL/uL — ABNORMAL LOW (ref 4.22–5.81)
WBC: 6.7 10*3/uL (ref 4.0–10.5)

## 2013-02-15 LAB — RAPID URINE DRUG SCREEN, HOSP PERFORMED
Barbiturates: NOT DETECTED
Cocaine: NOT DETECTED
Opiates: NOT DETECTED

## 2013-02-15 LAB — COMPREHENSIVE METABOLIC PANEL
AST: 55 U/L — ABNORMAL HIGH (ref 0–37)
BUN: 8 mg/dL (ref 6–23)
CO2: 23 mEq/L (ref 19–32)
Chloride: 103 mEq/L (ref 96–112)
Creatinine, Ser: 0.77 mg/dL (ref 0.50–1.35)
GFR calc non Af Amer: >90 mL/min (ref >90)
Total Bilirubin: 0.2 mg/dL — ABNORMAL LOW (ref 0.3–1.2)

## 2013-02-15 MED ORDER — NICOTINE 21 MG/24HR TD PT24
21.0000 mg | MEDICATED_PATCH | Freq: Every day | TRANSDERMAL | Status: DC
  Administered 2013-02-15: 21 mg via TRANSDERMAL
  Filled 2013-02-15: qty 1

## 2013-02-15 MED ORDER — ONDANSETRON HCL 4 MG PO TABS: 4.0000 mg | ORAL_TABLET | Freq: Three times a day (TID) | ORAL | Status: DC | PRN

## 2013-02-15 MED ORDER — IBUPROFEN 200 MG PO TABS: 600.0000 mg | ORAL_TABLET | Freq: Three times a day (TID) | ORAL | Status: DC | PRN

## 2013-02-15 MED ORDER — VITAMIN B-1 100 MG PO TABS
100.0000 mg | ORAL_TABLET | Freq: Every day | ORAL | Status: DC
  Administered 2013-02-15: 23:00:00 100 mg via ORAL
  Filled 2013-02-15: qty 1

## 2013-02-15 MED ORDER — LORAZEPAM 1 MG PO TABS: 0.0000 mg | ORAL_TABLET | Freq: Two times a day (BID) | ORAL | Status: DC

## 2013-02-15 MED ORDER — LORAZEPAM 1 MG PO TABS
0.0000 mg | ORAL_TABLET | Freq: Four times a day (QID) | ORAL | Status: DC
  Administered 2013-02-15 – 2013-02-16 (×2): 2 mg via ORAL
  Filled 2013-02-15 (×2): qty 2

## 2013-02-15 MED ORDER — THIAMINE HCL 100 MG/ML IJ SOLN: 100.0000 mg | Freq: Every day | INTRAMUSCULAR | Status: DC

## 2013-02-15 MED ORDER — ZOLPIDEM TARTRATE 5 MG PO TABS
5.0000 mg | ORAL_TABLET | Freq: Every evening | ORAL | Status: DC | PRN
  Administered 2013-02-15: 5 mg via ORAL
  Filled 2013-02-15: qty 1

## 2013-02-15 NOTE — ED Notes (Signed)
Patient in blue scrubs and red socks has been wanded by security.Thomas Atkins He was 2 personal belonging bags and a black book bag

## 2013-02-15 NOTE — ED Notes (Signed)
Patient is alert and oriented x3.  He is requesting detox from alcohol.  He states his last drink was just before coming to the  ED.  He has been to [facility] before coming to the ED and they told him that he needed to come here first.

## 2013-02-15 NOTE — ED Notes (Signed)
Patient appears anxious, irritable, sad,labile. Denies SI, HI, AVH. Rates anxiety 25/10. States that he has been drinking since age 37. Reports that he has recently lost his job and will soon lose his apartment. Also has stress about his father and no seeing his daughter. Patient states "I'm tired of being sick". States that he wants detox.   Encouragement offered. Patient given Ativan, Nicoderm, B1, and Ambien.   Patient sitting quietly in bed. Patient safety maintained, Q 15 checks started and maintained.

## 2013-02-15 NOTE — ED Provider Notes (Signed)
CSN: [id number]     Arrival date & time 02/15/13  1940 History  This chart was scribed for Ivonne Andrew, PA, working with Shon Baton, MD, by Ardelia Mems ED Scribe. This patient was seen in room WTR1/WLPT1 and the patient's care was started at 8:26 PM.   Chief Complaint  Patient presents with  . Alcohol Intoxication  . Alcohol Problem    The history is provided by the patient. No language interpreter was used.    HPI Comments: Thomas Atkins is a 37 y.o. Male with a history of alcholism who presents to the Emergency Department requesting detox from alcohol. He states that he has been to [facility] prior to coming to the ED today, and was told to come here for medical clearance. He states that her has been drinking almost daily, every day since he was 10. He states that he last drank alcohol about 2 hours ago. He states that he drinks beer only and that he does not drink liquor. He states that he has DTs every morning, and then has to start drinking. He states that he drinks up to eight 40s a day. He also states that he occasionally has sweats and chills in the mornings occasionally which he attributes to withdrawal. He further states that he has been having some intermittent abdominal pain and diarrhea recently, and that he believes he may have had his first seizure this morning. He states that he has had multiple failed attempts at detox in the past. He also states that he has attended AA in the past, about 7 times. He states that he has a good family support system. He denies SI, HI, fever, emesis or any other recent symptoms. He states that he does not use IV drugs or any other substances.   Past Medical History  Diagnosis Date  . Syncope   . Seizure disorder   . Alcoholism    Past Surgical History  Procedure Laterality Date  . Surgery scrotal / testicular     Family History  Problem Relation Age of Onset  . Heart disease Paternal Grandmother    History  Substance Use Topics  .  Smoking status: Current Every Day Smoker -- 0.50 packs/day  . Smokeless tobacco: Not on file  . Alcohol Use: 14.4 oz/week    24 Cans of beer per week     Comment: drinks case beer daily    Review of Systems  Constitutional: Positive for chills and diaphoresis ("sweats"). Negative for fever.  Gastrointestinal: Positive for abdominal pain and diarrhea. Negative for vomiting.  Psychiatric/Behavioral: Negative for suicidal ideas.       Denies HI.  All other systems reviewed and are negative.   Allergies  Ultram  Home Medications   Current Outpatient Rx  Name  Route  Sig  Dispense  Refill  . chlordiazePOXIDE (LIBRIUM) 25 MG capsule   Oral   Take 2 capsules (50 mg total) by mouth 4 (four) times daily.   50 capsule   0    Triage Vitals: BP 109/79  Pulse 98  Temp(Src) 97.1 F (36.2 C) (Oral)  Resp 16  SpO2 96%  Physical Exam  Nursing note and vitals reviewed. Constitutional: He is oriented to person, place, and time. He appears well-developed and well-nourished. No distress.  HENT:  Head: Normocephalic and atraumatic.  Eyes: EOM are normal.  Neck: Neck supple. No tracheal deviation present.  Cardiovascular: Normal rate.   Pulmonary/Chest: Effort normal. No respiratory distress.  Musculoskeletal: Normal range  of motion.  Neurological: He is alert and oriented to person, place, and time.  Skin: Skin is warm and dry.  Psychiatric: He has a normal mood and affect. His behavior is normal.    ED Course  Procedures   DIAGNOSTIC STUDIES: Oxygen Saturation is 96% on RA, normal by my interpretation.    COORDINATION OF CARE: 8:31 PM- patient seen and evaluated. Does appear intoxicated. Denies SI or HI. Pt advised of plan for treatment and pt agrees.  11:00 p.m. patient medically cleared. He is stable for further psychiatric and substance abuse evaluation. Psychiatric holding orders in place. Detox protocol in place. TTS consult ordered.    Results for orders placed during  the hospital encounter of 02/15/13  CBC WITH DIFFERENTIAL      Result Value Range   WBC 6.7  4.0 - 10.5 K/uL   RBC 4.20 (*) 4.22 - 5.81 MIL/uL   Hemoglobin 14.5  13.0 - 17.0 g/dL   HCT 14.7  82.9 - 56.2 %   MCV 96.4  78.0 - 100.0 fL   MCH 34.5 (*) 26.0 - 34.0 pg   MCHC 35.8  30.0 - 36.0 g/dL   RDW 13.0  86.5 - 78.4 %   Platelets 191  150 - 400 K/uL   Neutrophils Relative % 42 (*) 43 - 77 %   Neutro Abs 2.8  1.7 - 7.7 K/uL   Lymphocytes Relative 43  12 - 46 %   Lymphs Abs 2.9  0.7 - 4.0 K/uL   Monocytes Relative 9  3 - 12 %   Monocytes Absolute 0.6  0.1 - 1.0 K/uL   Eosinophils Relative 5  0 - 5 %   Eosinophils Absolute 0.3  0.0 - 0.7 K/uL   Basophils Relative 1  0 - 1 %   Basophils Absolute 0.1  0.0 - 0.1 K/uL  COMPREHENSIVE METABOLIC PANEL      Result Value Range   Sodium 142  135 - 145 mEq/L   Potassium 3.8  3.5 - 5.1 mEq/L   Chloride 103  96 - 112 mEq/L   CO2 23  19 - 32 mEq/L   Glucose, Bld 81  70 - 99 mg/dL   BUN 8  6 - 23 mg/dL   Creatinine, Ser 6.96  0.50 - 1.35 mg/dL   Calcium 9.2  8.4 - 29.5 mg/dL   Total Protein 7.4  6.0 - 8.3 g/dL   Albumin 4.2  3.5 - 5.2 g/dL   AST 55 (*) 0 - 37 U/L   ALT 45  0 - 53 U/L   Alkaline Phosphatase 60  39 - 117 U/L   Total Bilirubin 0.2 (*) 0.3 - 1.2 mg/dL   GFR calc non Af Amer >90  >90 mL/min   GFR calc Af Amer >90  >90 mL/min  ETHANOL      Result Value Range   Alcohol, Ethyl (B) 281 (*) 0 - 11 mg/dL  URINE RAPID DRUG SCREEN (HOSP PERFORMED)      Result Value Range   Opiates NONE DETECTED  NONE DETECTED   Cocaine NONE DETECTED  NONE DETECTED   Benzodiazepines NONE DETECTED  NONE DETECTED   Amphetamines NONE DETECTED  NONE DETECTED   Tetrahydrocannabinol NONE DETECTED  NONE DETECTED   Barbiturates NONE DETECTED  NONE DETECTED      MDM   1. Alcohol dependence   2. Alcohol intoxication      I personally performed the services described in this documentation, which was scribed  in my presence. The recorded information  has been reviewed and is accurate.     Angus Seller, PA-C 02/15/13 2314

## 2013-02-16 ENCOUNTER — Inpatient Hospital Stay (HOSPITAL_COMMUNITY)
Admission: AD | Admit: 2013-02-16 | Discharge: 2013-02-20 | DRG: 897 | Disposition: A | Discharge status: Home / Self Care | Payer: Federal, State, Local not specified - Other | Source: Intra-hospital | Attending: Psychiatry | Admitting: Psychiatry

## 2013-02-16 ENCOUNTER — Encounter (HOSPITAL_COMMUNITY): Payer: Self-pay | Admitting: General Practice

## 2013-02-16 ENCOUNTER — Encounter (HOSPITAL_COMMUNITY): Payer: Self-pay | Admitting: Registered Nurse

## 2013-02-16 DIAGNOSIS — F1020 Alcohol dependence, uncomplicated: Principal | ICD-10-CM | POA: Diagnosis present

## 2013-02-16 DIAGNOSIS — F32A Depression, unspecified: Secondary | ICD-10-CM | POA: Diagnosis present

## 2013-02-16 DIAGNOSIS — F411 Generalized anxiety disorder: Secondary | ICD-10-CM | POA: Diagnosis present

## 2013-02-16 DIAGNOSIS — F332 Major depressive disorder, recurrent severe without psychotic features: Secondary | ICD-10-CM | POA: Diagnosis present

## 2013-02-16 DIAGNOSIS — Z79899 Other long term (current) drug therapy: Secondary | ICD-10-CM

## 2013-02-16 DIAGNOSIS — F1010 Alcohol abuse, uncomplicated: Secondary | ICD-10-CM | POA: Diagnosis present

## 2013-02-16 DIAGNOSIS — F10939 Alcohol use, unspecified with withdrawal, unspecified: Secondary | ICD-10-CM | POA: Diagnosis present

## 2013-02-16 DIAGNOSIS — G40909 Epilepsy, unspecified, not intractable, without status epilepticus: Secondary | ICD-10-CM | POA: Diagnosis present

## 2013-02-16 DIAGNOSIS — F1994 Other psychoactive substance use, unspecified with psychoactive substance-induced mood disorder: Secondary | ICD-10-CM

## 2013-02-16 DIAGNOSIS — F1910 Other psychoactive substance abuse, uncomplicated: Secondary | ICD-10-CM

## 2013-02-16 DIAGNOSIS — Z9289 Personal history of other medical treatment: Secondary | ICD-10-CM

## 2013-02-16 DIAGNOSIS — F329 Major depressive disorder, single episode, unspecified: Secondary | ICD-10-CM | POA: Diagnosis present

## 2013-02-16 DIAGNOSIS — F10239 Alcohol dependence with withdrawal, unspecified: Secondary | ICD-10-CM | POA: Diagnosis present

## 2013-02-16 MED ORDER — LOPERAMIDE HCL 2 MG PO CAPS: 2.0000 mg | ORAL_CAPSULE | ORAL | Status: DC | PRN

## 2013-02-16 MED ORDER — NICOTINE 21 MG/24HR TD PT24
MEDICATED_PATCH | TRANSDERMAL | Status: AC
  Administered 2013-02-16: 21 mg via TRANSDERMAL
  Filled 2013-02-16: qty 1

## 2013-02-16 MED ORDER — HYDROXYZINE HCL 25 MG PO TABS: 25.0000 mg | ORAL_TABLET | Freq: Four times a day (QID) | ORAL | Status: DC | PRN

## 2013-02-16 MED ORDER — ADULT MULTIVITAMIN W/MINERALS CH
1.0000 | ORAL_TABLET | Freq: Every day | ORAL | Status: DC
  Administered 2013-02-16 – 2013-02-20 (×5): 1 via ORAL
  Filled 2013-02-16 (×8): qty 1

## 2013-02-16 MED ORDER — CHLORDIAZEPOXIDE HCL 25 MG PO CAPS
25.0000 mg | ORAL_CAPSULE | Freq: Every day | ORAL | Status: AC
  Administered 2013-02-19: 25 mg via ORAL
  Filled 2013-02-16: qty 1

## 2013-02-16 MED ORDER — CHLORDIAZEPOXIDE HCL 25 MG PO CAPS: 25.0000 mg | ORAL_CAPSULE | Freq: Three times a day (TID) | ORAL | Status: DC

## 2013-02-16 MED ORDER — NICOTINE 21 MG/24HR TD PT24
21.0000 mg | MEDICATED_PATCH | Freq: Every day | TRANSDERMAL | Status: DC
  Administered 2013-02-16 – 2013-02-20 (×5): 21 mg via TRANSDERMAL
  Filled 2013-02-16 (×6): qty 1

## 2013-02-16 MED ORDER — ADULT MULTIVITAMIN W/MINERALS CH: 1.0000 | ORAL_TABLET | Freq: Every day | ORAL | Status: DC

## 2013-02-16 MED ORDER — HYDROXYZINE HCL 25 MG PO TABS
25.0000 mg | ORAL_TABLET | Freq: Four times a day (QID) | ORAL | Status: AC | PRN
  Administered 2013-02-16 – 2013-02-17 (×4): 25 mg via ORAL
  Filled 2013-02-16 (×4): qty 1

## 2013-02-16 MED ORDER — CHLORDIAZEPOXIDE HCL 25 MG PO CAPS
25.0000 mg | ORAL_CAPSULE | ORAL | Status: AC
  Administered 2013-02-18 (×2): 25 mg via ORAL
  Filled 2013-02-16 (×2): qty 1

## 2013-02-16 MED ORDER — CHLORDIAZEPOXIDE HCL 25 MG PO CAPS: 25.0000 mg | ORAL_CAPSULE | Freq: Four times a day (QID) | ORAL | Status: DC | PRN

## 2013-02-16 MED ORDER — ALUM & MAG HYDROXIDE-SIMETH 200-200-20 MG/5ML PO SUSP: 30.0000 mL | ORAL | Status: DC | PRN

## 2013-02-16 MED ORDER — CHLORDIAZEPOXIDE HCL 25 MG PO CAPS
25.0000 mg | ORAL_CAPSULE | Freq: Three times a day (TID) | ORAL | Status: AC
  Administered 2013-02-17 (×3): 25 mg via ORAL
  Filled 2013-02-16 (×3): qty 1

## 2013-02-16 MED ORDER — MAGNESIUM HYDROXIDE 400 MG/5ML PO SUSP: 30.0000 mL | Freq: Every day | ORAL | Status: DC | PRN

## 2013-02-16 MED ORDER — CHLORDIAZEPOXIDE HCL 25 MG PO CAPS: 25.0000 mg | ORAL_CAPSULE | Freq: Four times a day (QID) | ORAL | Status: AC | PRN

## 2013-02-16 MED ORDER — CHLORDIAZEPOXIDE HCL 25 MG PO CAPS
25.0000 mg | ORAL_CAPSULE | Freq: Once | ORAL | Status: AC
  Administered 2013-02-16: 25 mg via ORAL
  Filled 2013-02-16: qty 1

## 2013-02-16 MED ORDER — ONDANSETRON 4 MG PO TBDP: 4.0000 mg | ORAL_TABLET | Freq: Four times a day (QID) | ORAL | Status: DC | PRN

## 2013-02-16 MED ORDER — CHLORDIAZEPOXIDE HCL 25 MG PO CAPS
25.0000 mg | ORAL_CAPSULE | Freq: Four times a day (QID) | ORAL | Status: AC
  Administered 2013-02-16 (×2): 25 mg via ORAL
  Filled 2013-02-16 (×2): qty 1

## 2013-02-16 MED ORDER — TRAZODONE HCL 50 MG PO TABS
50.0000 mg | ORAL_TABLET | Freq: Every evening | ORAL | Status: DC | PRN
  Administered 2013-02-16 – 2013-02-19 (×4): 50 mg via ORAL
  Filled 2013-02-16 (×4): qty 1
  Filled 2013-02-16 (×2): qty 28
  Filled 2013-02-16 (×7): qty 1

## 2013-02-16 MED ORDER — LOPERAMIDE HCL 2 MG PO CAPS: 2.0000 mg | ORAL_CAPSULE | ORAL | Status: AC | PRN

## 2013-02-16 MED ORDER — CHLORDIAZEPOXIDE HCL 25 MG PO CAPS: 25.0000 mg | ORAL_CAPSULE | ORAL | Status: DC

## 2013-02-16 MED ORDER — ONDANSETRON 4 MG PO TBDP
4.0000 mg | ORAL_TABLET | Freq: Four times a day (QID) | ORAL | Status: AC | PRN
  Administered 2013-02-16 – 2013-02-17 (×2): 4 mg via ORAL
  Filled 2013-02-16 (×3): qty 1

## 2013-02-16 MED ORDER — CHLORDIAZEPOXIDE HCL 25 MG PO CAPS: 25.0000 mg | ORAL_CAPSULE | Freq: Four times a day (QID) | ORAL | Status: DC

## 2013-02-16 MED ORDER — CHLORDIAZEPOXIDE HCL 25 MG PO CAPS: 25.0000 mg | ORAL_CAPSULE | Freq: Every day | ORAL | Status: DC

## 2013-02-16 MED ORDER — VITAMIN B-1 100 MG PO TABS
100.0000 mg | ORAL_TABLET | Freq: Every day | ORAL | Status: DC
  Administered 2013-02-16 – 2013-02-20 (×5): 100 mg via ORAL
  Filled 2013-02-16 (×8): qty 1

## 2013-02-16 MED ORDER — ACETAMINOPHEN 325 MG PO TABS
650.0000 mg | ORAL_TABLET | Freq: Four times a day (QID) | ORAL | Status: DC | PRN
  Administered 2013-02-16 – 2013-02-19 (×4): 650 mg via ORAL
  Filled 2013-02-16 (×4): qty 2

## 2013-02-16 MED ORDER — CHLORDIAZEPOXIDE HCL 25 MG PO CAPS: 25.0000 mg | ORAL_CAPSULE | Freq: Once | ORAL | Status: DC

## 2013-02-16 NOTE — Consult Note (Signed)
Agree with disposition of inpatient detox and treatment for depression.  Patient was accepted to [facility] by Donell Sievert PA-C.  Patient has been assigned bed 506/01.  Will start Librium detox protocol.   Face to face consult/interview with Dr. Claudius Sis B. Rankin FNP-BC Family Nurse Practitioner, Board Certified

## 2013-02-16 NOTE — Progress Notes (Signed)
[facility] CL spoke with patient about [facility] [employer]. Patient stated that he was already in the process of getting the [employer] with [employer] and [employer].

## 2013-02-16 NOTE — Progress Notes (Signed)
Recreation Therapy Notes  Date: 12.10.2014 Time: 3:00pm Location: [address] Dayroom  Group Topic: Leisure Education  Goal Area(s) Addresses:  Patient will identify positive leisure activities.  Patient will identify one positive benefit of participation in leisure activities.   Behavioral Response: Did not attend.   Marykay Lex Stacey Maura, LRT/CTRS  Jearl Klinefelter 02/16/2013 3:55 PM

## 2013-02-16 NOTE — Tx Team (Signed)
Initial Interdisciplinary Treatment Plan  PATIENT STRENGTHS: (choose at least two) Active sense of humor Average or above average intelligence Capable of independent living Communication skills General fund of knowledge Supportive family/friends  PATIENT STRESSORS: Financial difficulties Occupational concerns Substance abuse   PROBLEM LIST: Problem List/Patient Goals Date to be addressed Date deferred Reason deferred Estimated date of resolution  Substance Abuse 02/16/2013                                                      DISCHARGE CRITERIA:  Ability to meet basic life and health needs Motivation to continue treatment in a less acute level of care Withdrawal symptoms are absent or subacute and managed without 24-hour nursing intervention  PRELIMINARY DISCHARGE PLAN: Attend PHP/IOP Attend 12-step recovery group Outpatient therapy  PATIENT/FAMIILY INVOLVEMENT: This treatment plan has been presented to and reviewed with the patient, Thomas Atkins.  The patient and family have been given the opportunity to ask questions and make suggestions.  Marzetta Board E 02/16/2013, 4:23 PM

## 2013-02-16 NOTE — Consult Note (Signed)
Note reviewed and agreed with  

## 2013-02-16 NOTE — Progress Notes (Signed)
Patient ID: [name], male   DOB: 10-May-1975, 37 y.o.   MRN: [id number]  Thomas Atkins is a 37 y.o. Caucasian male admitted to [facility] voluntary for ETOH detox.  Patient admits to drinking "about eight 40s a day." Patient denies SI/HI but does endorse auditory and visual hallucinations. Patient describes this as, "a movie playing." Patient reports that he believes he had a seizure earlier this morning before admission to [facility] but only described it as his "head feeling like it was spinning." Patient is pleasant and cooperative with Clinical research associate during admission process. Patient was given PRN Vistaril for anxiety and zofran for nausea. Patient reported a relief in symptoms when reevaluated. Patient has a PMH of syncope, alcoholism, and seizures. Pt does not show any signs of distress other than unsteady gait. Patient is considered a high fall risk due to this and falls related to etoh use. Pt was informed about fall risk. Patient told writer that he did not have any pain. Pt stated his major withdrawal symptoms were hot/cold sweats, tremors, anxiety and agitation. Scheduled medications were given to patient per physician's orders. Q15 minute safety checks were ordered are maintained.

## 2013-02-16 NOTE — BH Assessment (Signed)
Assessment Note  Thomas Atkins is an 37 y.o. male. Patient presents seeking detox from alcohol.  Patient states that he drinks everyday. He lost his job in July at the [employer] due to his drinking and is due to be evicted from his apartment this week. He also states that not seeing his 37 yo for the past year is also a stressor for him. He states that he is seeking detox because he doesn't want to be sick anymore. He states that if he doesn't drink he gets sick.   Patient states that his family is very supportive he is just having a hard time trying to stop. He denies suicidal and homicidal thoughts; denies auditory or visual hallucinations.   Axis I: Alcohol Dependence Axis II: Deferred Axis III:  Past Medical History  Diagnosis Date  . Syncope   . Seizure disorder   . Alcoholism    Axis IV: economic problems, housing problems, occupational problems, other psychosocial or environmental problems and problems related to social environment Axis V: 40  Past Medical History:  Past Medical History  Diagnosis Date  . Syncope   . Seizure disorder   . Alcoholism     Past Surgical History  Procedure Laterality Date  . Surgery scrotal / testicular      Family History:  Family History  Problem Relation Age of Onset  . Heart disease Paternal Grandmother     Social History:  reports that he has been smoking.  He does not have any smokeless tobacco history on file. He reports that he drinks about 14.4 ounces of alcohol per week. He reports that he uses illicit drugs (Marijuana).  Additional Social History:  Alcohol / Drug Use History of alcohol / drug use?: Yes Negative Consequences of Use: Financial;Legal;Personal relationships;Work / School Withdrawal Symptoms: Irritability;Tremors;Weakness;Patient aware of relationship between substance abuse and physical/medical complications Substance #1 Name of Substance 1: Etoh 1 - Age of First Use: 10 1 - Amount (size/oz): (8) 40oz 1 -  Frequency: Daily 1 - Duration: 1 year 1 - Last Use / Amount: tonight/ (2) 40s  CIWA: CIWA-Ar BP: 110/78 mmHg Pulse Rate: 65 Nausea and Vomiting: mild nausea with no vomiting Tactile Disturbances: none Tremor: three Auditory Disturbances: not present Paroxysmal Sweats: two Visual Disturbances: not present Anxiety: moderately anxious, or guarded, so anxiety is inferred Headache, Fullness in Head: none present Agitation: three Orientation and Clouding of Sensorium: oriented and can do serial additions CIWA-Ar Total: 13 COWS:    Allergies:  Allergies  Allergen Reactions  . Ultram [Tramadol Hcl] Hives         Home Medications:  (Not in a hospital admission)  OB/GYN Status:  No LMP for male patient.  General Assessment Data Location of Assessment: [facility] Is this a Tele or Face-to-Face Assessment?: Face-to-Face Is this an Initial Assessment or a Re-assessment for this encounter?: Initial Assessment Living Arrangements: Alone Can pt return to current living arrangement?: Yes Admission Status: Voluntary Is patient capable of signing voluntary admission?: Yes Transfer from: Home Referral Source: MD  Medical Screening Exam [facility] Walk-in ONLY) Medical Exam completed: Yes  [facility] Crisis Care Plan Living Arrangements: Alone Name of Psychiatrist: Noe Name of Therapist: None  Education Status Is patient currently in school?: No Current Grade: Na Highest grade of school patient has completed: GED Name of school: Na Contact person: Jojuan Champney mother  Risk to self Suicidal Ideation: No Suicidal Intent: No Is patient at risk for suicide?: No Suicidal Plan?: No Access  to Means: No What has been your use of drugs/alcohol within the last 12 months?: Daily Previous Attempts/Gestures: No How many times?:  (Na) Other Self Harm Risks:  (Na) Triggers for Past Attempts: None known Intentional Self Injurious Behavior: None Family Suicide History: No Recent stressful life  event(s): Other (Comment);Job Loss (d/t be evicted this week) Persecutory voices/beliefs?: No Depression: Yes Depression Symptoms: Insomnia Substance abuse history and/or treatment for substance abuse?: Yes Suicide prevention information given to non-admitted patients: Not applicable  Risk to Others Homicidal Ideation: No Thoughts of Harm to Others: No Current Homicidal Intent: No Current Homicidal Plan: No Access to Homicidal Means: No Identified Victim: Na History of harm to others?: No Assessment of Violence: None Noted Violent Behavior Description: Na Does patient have access to weapons?: No Criminal Charges Pending?: No Does patient have a court date: No  Psychosis Hallucinations: None noted Delusions: None noted  Mental Status Report Appear/Hygiene:  (WNL) Eye Contact: Fair Motor Activity: Freedom of movement;Unremarkable Speech: Logical/coherent Level of Consciousness: Drowsy Mood: Depressed Affect: Appropriate to circumstance Anxiety Level: None Thought Processes: Coherent;Relevant Judgement: Unimpaired Orientation: Person;Place;Time;Situation Obsessive Compulsive Thoughts/Behaviors: None  Cognitive Functioning Concentration: Decreased Memory: Recent Intact;Remote Intact IQ: Average Insight: Fair Impulse Control: Fair Appetite: Good Weight Loss:  (None noted) Weight Gain:  (None noted) Sleep: Decreased Total Hours of Sleep:  (2 hr naps) Vegetative Symptoms: None  ADLScreening [facility] Assessment Services) Patient's cognitive ability adequate to safely complete daily activities?: Yes Patient able to express need for assistance with ADLs?: Yes Independently performs ADLs?: Yes (appropriate for developmental age)  Prior Inpatient Therapy Prior Inpatient Therapy: Yes Prior Therapy Dates: This year Prior Therapy Facilty/Provider(s): [facility] Reason for Treatment: Detox  Prior Outpatient Therapy Prior Outpatient Therapy: No Prior Therapy Dates: Na Prior  Therapy Facilty/Provider(s): Na Reason for Treatment: Na  ADL Screening (condition at time of admission) Patient's cognitive ability adequate to safely complete daily activities?: Yes Is the patient deaf or have difficulty hearing?: No Does the patient have difficulty seeing, even when wearing glasses/contacts?: No Does the patient have difficulty concentrating, remembering, or making decisions?: No Patient able to express need for assistance with ADLs?: Yes Does the patient have difficulty dressing or bathing?: No Independently performs ADLs?: Yes (appropriate for developmental age) Does the patient have difficulty walking or climbing stairs?: No Weakness of Legs: None Weakness of Arms/Hands: None  Home Assistive Devices/Equipment Home Assistive Devices/Equipment: None  Therapy Consults (therapy consults require a physician order) PT Evaluation Needed: No OT Evalulation Needed: No SLP Evaluation Needed: No Abuse/Neglect Assessment (Assessment to be complete while patient is alone) Physical Abuse: Denies Verbal Abuse: Denies Sexual Abuse: Denies Exploitation of patient/patient's resources: Denies Self-Neglect: Denies Values / Beliefs Cultural Requests During Hospitalization: None Spiritual Requests During Hospitalization: None Consults Spiritual Care Consult Needed: No Social Work Consult Needed: No Merchant navy officer (For Healthcare) Advance Directive: Patient does not have advance directive;Patient would not like information    Additional Information 1:1 In Past 12 Months?: No CIRT Risk: No Elopement Risk: No Does patient have medical clearance?: Yes     Disposition:  Disposition Initial Assessment Completed for this Encounter: Yes Disposition of Patient: Inpatient treatment program;Referred to Type of inpatient treatment program: Adult Patient referred to: [facility])  On Site Evaluation by:   Reviewed with Physician:    Rudi Coco 02/16/2013 2:02  AM

## 2013-02-16 NOTE — ED Provider Notes (Signed)
Medical screening examination/treatment/procedure(s) were performed by non-physician practitioner and as supervising physician I was immediately available for consultation/collaboration.  EKG Interpretation   None        Dionicia Cerritos F Vesna Kable, MD 02/16/13 1303 

## 2013-02-16 NOTE — Progress Notes (Signed)
Patient has been accepted at [facility] by Donell Sievert PA pending bed availability.

## 2013-02-16 NOTE — Progress Notes (Signed)
[facility] Group Notes:  (Nursing/MHT/Case Management/Adjunct)  Date:  02/16/2013  Time:  9:47 PM  Type of Therapy:  Group Therapy  Participation Level:  Active  Participation Quality:  Appropriate  Affect:  Appropriate  Cognitive:  Appropriate  Insight:  Appropriate  Engagement in Group:  Engaged  Modes of Intervention:  Discussion  Summary of Progress/Problems:This was the patients first day.  [name] 02/16/2013, 9:47 PM

## 2013-02-17 DIAGNOSIS — F1020 Alcohol dependence, uncomplicated: Principal | ICD-10-CM

## 2013-02-17 DIAGNOSIS — F10239 Alcohol dependence with withdrawal, unspecified: Secondary | ICD-10-CM

## 2013-02-17 DIAGNOSIS — F1994 Other psychoactive substance use, unspecified with psychoactive substance-induced mood disorder: Secondary | ICD-10-CM

## 2013-02-17 DIAGNOSIS — F329 Major depressive disorder, single episode, unspecified: Secondary | ICD-10-CM

## 2013-02-17 MED ORDER — PNEUMOCOCCAL VAC POLYVALENT 25 MCG/0.5ML IJ INJ
0.5000 mL | INJECTION | INTRAMUSCULAR | Status: AC
  Administered 2013-02-18: 0.5 mL via INTRAMUSCULAR

## 2013-02-17 MED ORDER — FLUOXETINE HCL 10 MG PO CAPS
10.0000 mg | ORAL_CAPSULE | Freq: Every day | ORAL | Status: DC
  Administered 2013-02-17 – 2013-02-20 (×4): 10 mg via ORAL
  Filled 2013-02-17: qty 1
  Filled 2013-02-17: qty 14
  Filled 2013-02-17 (×4): qty 1

## 2013-02-17 MED ORDER — PNEUMOCOCCAL 13-VAL CONJ VACC IM SUSP: 0.5000 mL | INTRAMUSCULAR | Status: DC
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
## 2013-02-17 NOTE — Progress Notes (Signed)
D: Pt presents with the following detox symptoms: mild tremors, nausea, fullness in the head, and anxiety. Pt's withdrawal symptoms managed with the appropriate prn medications. Pt presented with an anxious affect to Clinical research associate. Pt was active on the unit and attended group. Pt does not endorse any SI/HI/AVH at this time.  A: Writer administered scheduled and prn medications to pt. Continued support and availability as needed was extended to this pt. Staff continue to monitor pt with q48min checks.  R: No adverse drug reactions noted. Pt receptive to treatment. Pt remains safe at this time.

## 2013-02-17 NOTE — Progress Notes (Signed)
Adult Psychoeducational Group Note  Date:  02/17/2013 Time:  1:42 PM  Group Topic/Focus: Therapeutic Activity  Participation Level:  Did Not Attend  Additional Comments:  Pt was in bed.  Thomas Atkins 02/17/2013, 1:42 PM

## 2013-02-17 NOTE — Progress Notes (Signed)
Adult Psychoeducational Group Note  Date:  02/17/2013 Time:  9:11 PM  Group Topic/Focus:  Goals Group:   The focus of this group is to help patients establish daily goals to achieve during treatment and discuss how the patient can incorporate goal setting into their daily lives to aide in recovery.  Participation Level:  Active  Participation Quality:  Appropriate  Affect:  Appropriate  Cognitive:  Appropriate  Insight: Appropriate  Engagement in Group:  Engaged  Modes of Intervention:  Discussion  Additional Comments:  Pt stated the day was a little sluggish, but was able to take care of some home things and that made his day better.   Terie Purser R 02/17/2013, 9:11 PM

## 2013-02-17 NOTE — [facility] Suicide Risk Assessment (Signed)
[facility] INPATIENT:  Family/Significant Other Suicide Prevention Education  Suicide Prevention Education:  Education Completed; has been identified by the patient as the family member/significant other with whom the patient will be residing, and identified as the person(s) who will aid the patient in the event of a mental health crisis (suicidal ideations/suicide attempt).  With written consent from the patient, the family member/significant other has been provided the following suicide prevention education, prior to the and/or following the discharge of the patient.  Patient was not endorsing SI at time of admission.  The suicide prevention education provided includes the following:  Suicide risk factors  Suicide prevention and interventions  National Suicide Hotline telephone number  [facility] assessment telephone number  [facility] Emergency Assistance 911  [facility] and/or Residential Mobile Crisis Unit telephone number  Request made of family/significant other to:  Remove weapons (e.g., guns, rifles, knives), all items previously/currently identified as safety concern.    Remove drugs/medications (over-the-counter, prescriptions, illicit drugs), all items previously/currently identified as a safety concern.  The family member/significant other verbalizes understanding of the suicide prevention education information provided.  The family member/significant other agrees to remove the items of safety concern listed above.  Wynn Banker 02/17/2013, 2:10 PM

## 2013-02-17 NOTE — Progress Notes (Signed)
Adult Psychoeducational Group Note  Date:  02/17/2013 Time:  1:55 PM  Group Topic/Focus:  Self Esteem Action Plan:   The focus of this group is to help patients create a plan to continue to build self-esteem after discharge.  Participation Level:  Active  Participation Quality:  Appropriate and Attentive  Affect:  Appropriate  Cognitive:  Appropriate  Insight: Appropriate  Engagement in Group:  Engaged and Supportive  Modes of Intervention:  Discussion, Socialization and Support  Additional Comments:  Pts discussed different things that can increase and decrease in our life. Pts worked on coming up with something that positively helps our self esteem for each letter of the alphabet. Pt stated staying sober helps improve his self esteem. Pt stated he will do this by praying, going to meetings, getting a therapist, and staying on his meds.  Thomas Atkins 02/17/2013, 1:55 PM

## 2013-02-17 NOTE — Progress Notes (Addendum)
Patient ID: Thomas Atkins, male   DOB: [birth year], 37 y.o.   MRN: [id number]  D: Pt presents with anxious mood and affect. Patient is still experiencing slight tremors. Patient complains of feeling "weak." Patient denies SI/HI and A/V hallucinations. Patient denies pain. Patient rates his depression at a 2/10 today and his hopelessness at a 7/10.   A: Writer encouraged patient to eat more food and drink plenty of water to help with detox symptoms and weakness. Patient admits to not eating enough but states he does not really have an appetite. Writer gave patient scheduled medications per physician's order.  R: Patient is cooperative and pleasant with Clinical research associate. Patient is seen in the milieu periodically but did not go to morning groups. Q15 minute safety checks are maintained.

## 2013-02-17 NOTE — Progress Notes (Signed)
Patient ID: Thomas Atkins, male   DOB: 1975-06-28, 37 y.o.   MRN: [id number]  Morning Wellness Group 9:00 AM  The focus of this group is to educate the patient on the purpose and policies of crisis stabilization and provide a format to answer questions about their admission.  The group details unit policies and expectations of patients while admitted.  Patient did not attend.

## 2013-02-17 NOTE — [facility] Suicide Risk Assessment (Signed)
Suicide Risk Assessment  Admission Assessment     Nursing information obtained from:  Patient Demographic factors:  Male;Caucasian;Unemployed;Access to firearms Current Mental Status:  NA Loss Factors:  Financial problems / change in socioeconomic status;Loss of significant relationship Historical Factors:  Family history of mental illness or substance abuse;Impulsivity Risk Reduction Factors:  Living with another person, especially a relative  CLINICAL FACTORS:   Severe Anxiety and/or Agitation Depression:   Anhedonia Comorbid alcohol abuse/dependence Hopelessness Impulsivity Insomnia Recent sense of peace/wellbeing Severe Alcohol/Substance Abuse/Dependencies Unstable or Poor Therapeutic Relationship Previous Psychiatric Diagnoses and Treatments  COGNITIVE FEATURES THAT CONTRIBUTE TO RISK:  Closed-mindedness Loss of executive function Polarized thinking Thought constriction (tunnel vision)    SUICIDE RISK:   Moderate:  Frequent suicidal ideation with limited intensity, and duration, some specificity in terms of plans, no associated intent, good self-control, limited dysphoria/symptomatology, some risk factors present, and identifiable protective factors, including available and accessible social support.  PLAN OF CARE: Admit voluntarily and emergently from [facility] long emergency department for substance induced mood disorder, alcohol dependence and alcohol withdrawal symptoms and patient needed substance abuse counseling and alcohol detox treatment.  I certify that inpatient services furnished can reasonably be expected to improve the patient's condition.  Thomas Atkins,Thomas R. 02/17/2013, 10:50 AM
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
## 2013-02-17 NOTE — [facility] Group Notes (Signed)
[facility] LCSW Group Therapy  02/17/2013  1:15 PM   Type of Therapy:  Group Therapy  Participation Level:  Active  Participation Quality:  Attentive, Sharing and Supportive  Affect:  Depressed and Flat  Cognitive:  Alert and Oriented  Insight:  Developing/Improving, Engaged and Supportive  Engagement in Therapy:  Developing/Improving, Engaged and Supportive  Modes of Intervention:  Activity, Clarification, Confrontation, Discussion, Education, Exploration, Limit-setting, Orientation, Problem-solving, Rapport Building, Reality Testing, Socialization and Support  Summary of Progress/Problems: Patient was attentive and engaged with speaker from [employer].  Patient was attentive to speaker while they shared their story of dealing with mental health and overcoming it.  Patient expressed interest in their programs and services and received information on their agency.  Patient processed ways they can relate to the speaker.     Arvle Grabe Horton, LCSW 02/17/2013 2:35 PM  

## 2013-02-18 LAB — URINALYSIS, ROUTINE W REFLEX MICROSCOPIC
Hgb urine dipstick: NEGATIVE
Nitrite: NEGATIVE
Specific Gravity, Urine: 1.025 (ref 1.005–1.030)
Urobilinogen, UA: 1 mg/dL (ref 0.0–1.0)

## 2013-02-18 NOTE — Progress Notes (Signed)
Adult Psychoeducational Group Note  Date:  02/18/2013 Time:  11:20 AM  Group Topic/Focus:  Stages of Change:   The focus of this group is to explain the stages of change and help patients identify changes they want to make upon discharge.  Participation Level:  Active  Participation Quality:  Appropriate and Attentive  Affect:  Appropriate  Cognitive:  Appropriate  Insight: Appropriate  Engagement in Group:  Engaged  Modes of Intervention:  Discussion and Education   Thomas Atkins 02/18/2013, 11:20 AM

## 2013-02-18 NOTE — [facility] Group Notes (Signed)
[facility] LCSW Aftercare Discharge Planning Group Note   02/18/2013 12:30 PM    Participation Quality:  Appropraite  Mood/Affect:  Appropriate  Depression Rating:  1  Anxiety Rating:  5  Thoughts of Suicide:  No  Will you contract for safety?   NA  Current AVH:  No  Plan for Discharge/Comments:  Patient attended discharge planning group and actively participated in group.  He reports doing well today and denies any sign/symptoms of withdrawal.  Patient to follow up with [facility].  CSW provided all participants with daily workbook.   Transportation Means: Patient has transportation.   Supports:  Patient has a support system.   Kynlea Blackston, Joesph July

## 2013-02-18 NOTE — Progress Notes (Addendum)
D Pt is seen OOB UAL on the 500 hall this morning..tolerated fair.HE is pleasant and tries to be cooperative...and shares that he is having bladder pressure and discomfort. He shares that he sent a urine specimen yesterday and he inquires if the result is back yet.    A He interacts with his peers, attends his groups as planned and he is compliant with his therapies.    R Safety is in place and poc cont.

## 2013-02-18 NOTE — [facility] Group Notes (Signed)
[facility] LCSW Group Therapy  Feelings Around Relapse 1:15 -2:30        02/18/2013  2:57 PM   Type of Therapy:  Group Therapy  Participation Level:  Did not attend group.  Wynn Banker 02/18/2013  2:57 PM

## 2013-02-18 NOTE — Progress Notes (Signed)
D: Pt in bed resting with eyes closed. Respirations even and unlabored. Pt appears to be in no signs of distress at this time. A: Q15min checks remains for this pt. R: Pt remains safe at this time.   

## 2013-02-18 NOTE — Tx Team (Signed)
Interdisciplinary Treatment Plan Update   Date Reviewed:  02/18/2013  Time Reviewed:  9:38 AM  Progress in Treatment:   Attending groups: Yes Participating in groups: Yes Taking medication as prescribed: Yes  Tolerating medication: Yes Family/Significant other contact made: No, but will ask patient for consent for collateral contact Patient understands diagnosis: Yes  Discussing patient identified problems/goals with staff: Yes Medical problems stabilized or resolved: Yes Denies suicidal/homicidal ideation: Yes Patient has not harmed self or others: Yes  For review of initial/current patient goals, please see plan of care.  Estimated Length of Stay:  2-days  Reasons for Continued Hospitalization:  Anxiety Depression Medication stabilization Detox Protocol  New Problems/Goals identified:    Discharge Plan or Barriers:   Home with outpatient follow up at [facility]  Additional Comments:  N/A  Attendees:  Patient:  02/18/2013 9:38 AM   Signature: Mervyn Gay, MD 02/18/2013 9:38 AM  Signature:  Verne Spurr, PA 02/18/2013 9:38 AM  Signature: Harold Barban, RN 02/18/2013 9:38 AM  Signature: Lamount Cranker, RN 02/18/2013 9:38 AM  Signature:   02/18/2013 9:38 AM  Signature:  Juline Patch, LCSW 02/18/2013 9:38 AM  Signature:  Reyes Ivan, LCSW 02/18/2013 9:38 AM  Signature:   02/18/2013 9:38 AM  Signature:  02/18/2013 9:38 AM  Signature:  02/18/2013  9:38 AM  Signature:   Onnie Boer, RN [facility] 02/18/2013  9:38 AM  Signature:   02/18/2013  9:38 AM    Scribe for Treatment Team:   Juline Patch,  02/18/2013 9:38 AM

## 2013-02-18 NOTE — Progress Notes (Signed)
[facility] MD Progress Note  02/18/2013 12:11 PM Thomas Atkins  MRN:  [id number] Subjective:  Patient has been tolerating alcohol detox treatment program without significant difficulties. Patient reported he has a urinary frequency yesterday and feeding pain today. We'll check urinalysis for possible urinary tract infection.  Diagnosis:   DSM5: Schizophrenia Disorders:   Obsessive-Compulsive Disorders:   Trauma-Stressor Disorders:   Substance/Addictive Disorders:  Alcohol Intoxication with Use Disorder - Mild ((F10.129) and Alcohol Withdrawal without Perceptual Disturbances (F10.239) Depressive Disorders:  Major Depressive Disorder - Unspecified (296.20)  Axis I: Depressive Disorder NOS, Substance Induced Mood Disorder and Alcohol dependence  ADL's:  Intact  Sleep: Fair  Appetite:  Fair  Suicidal Ideation:  Denied Homicidal Ideation:  Denied AEB (as evidenced by):  Psychiatric Specialty Exam: ROS  Blood pressure 102/72, pulse 80, temperature 97.7 F (36.5 C), temperature source Oral, resp. rate 16, height 6\' 1"  (1.854 m), weight 69.4 kg (153 lb), SpO2 94.00%.Body mass index is 20.19 kg/(m^2).  General Appearance: Disheveled and Guarded  Patent attorney::  Fair  Speech:  Clear and Coherent and Normal Rate  Volume:  Decreased  Mood:  Anxious and Depressed  Affect:  Constricted and Depressed  Thought Process:  Goal Directed and Intact  Orientation:  Full (Time, Place, and Person)  Thought Content:  Rumination  Suicidal Thoughts:  No  Homicidal Thoughts:  No  Memory:  Immediate;   Fair  Judgement:  Impaired  Insight:  Lacking  Psychomotor Activity:  Psychomotor Retardation  Concentration:  Fair  Recall:  Good  Akathisia:  NA  Handed:  Right  AIMS (if indicated):     Assets:  Communication Skills Desire for Improvement Housing Physical Health Resilience Social Support Transportation  Sleep:  Number of Hours: 6.75   Current Medications: Current Facility-Administered  Medications  Medication Dose Route Frequency Provider Last Rate Last Dose  . acetaminophen (TYLENOL) tablet 650 mg  650 mg Oral Q6H PRN Shuvon Rankin, NP   650 mg at 02/18/13 0845  . alum & mag hydroxide-simeth (MAALOX/MYLANTA) 200-200-20 MG/5ML suspension 30 mL  30 mL Oral Q4H PRN Shuvon Rankin, NP      . chlordiazePOXIDE (LIBRIUM) capsule 25 mg  25 mg Oral Q6H PRN Shuvon Rankin, NP      . chlordiazePOXIDE (LIBRIUM) capsule 25 mg  25 mg Oral BH-qamhs Shuvon Rankin, NP   25 mg at 02/18/13 0851   Followed by  . [START ON 02/19/2013] chlordiazePOXIDE (LIBRIUM) capsule 25 mg  25 mg Oral Daily Shuvon Rankin, NP      . FLUoxetine (PROZAC) capsule 10 mg  10 mg Oral Daily Nehemiah Settle, MD   10 mg at 02/18/13 0843  . hydrOXYzine (ATARAX/VISTARIL) tablet 25 mg  25 mg Oral Q6H PRN Shuvon Rankin, NP   25 mg at 02/17/13 2139  . loperamide (IMODIUM) capsule 2-4 mg  2-4 mg Oral PRN Shuvon Rankin, NP      . magnesium hydroxide (MILK OF MAGNESIA) suspension 30 mL  30 mL Oral Daily PRN Shuvon Rankin, NP      . multivitamin with minerals tablet 1 tablet  1 tablet Oral Daily Shuvon Rankin, NP   1 tablet at 02/18/13 0843  . nicotine (NICODERM CQ - dosed in mg/24 hours) patch 21 mg  21 mg Transdermal Daily Nehemiah Settle, MD   21 mg at 02/18/13 0636  . ondansetron (ZOFRAN-ODT) disintegrating tablet 4 mg  4 mg Oral Q6H PRN Shuvon Rankin, NP   4 mg at 02/17/13 2139  .  pneumococcal 23 valent vaccine (PNU-IMMUNE) injection 0.5 mL  0.5 mL Intramuscular Tomorrow-1000 Nehemiah Settle, MD      . thiamine (VITAMIN B-1) tablet 100 mg  100 mg Oral Daily Nehemiah Settle, MD   100 mg at 02/18/13 0843  . traZODone (DESYREL) tablet 50 mg  50 mg Oral QHS,MR X 1 Kerry Hough, PA-C   50 mg at 02/17/13 2139    Lab Results:  Results for orders placed during the hospital encounter of 02/16/13 (from the past 48 hour(s))  URINALYSIS, ROUTINE W REFLEX MICROSCOPIC     Status: Abnormal    Collection Time    02/17/13  9:45 PM      Result Value Range   Color, Urine YELLOW  YELLOW   APPearance CLOUDY (*) CLEAR   Specific Gravity, Urine 1.025  1.005 - 1.030   pH 7.0  5.0 - 8.0   Glucose, UA NEGATIVE  NEGATIVE mg/dL   Hgb urine dipstick NEGATIVE  NEGATIVE   Bilirubin Urine NEGATIVE  NEGATIVE   Ketones, ur NEGATIVE  NEGATIVE mg/dL   Protein, ur NEGATIVE  NEGATIVE mg/dL   Urobilinogen, UA 1.0  0.0 - 1.0 mg/dL   Nitrite NEGATIVE  NEGATIVE   Leukocytes, UA NEGATIVE  NEGATIVE   Comment: MICROSCOPIC NOT DONE ON URINES WITH NEGATIVE PROTEIN, BLOOD, LEUKOCYTES, NITRITE, OR GLUCOSE <1000 mg/dL.     Performed at [facility]    Physical Findings: AIMS: Facial and Oral Movements Muscles of Facial Expression: None, normal Lips and Perioral Area: None, normal Jaw: None, normal Tongue: None, normal,Extremity Movements Upper (arms, wrists, hands, fingers): None, normal Lower (legs, knees, ankles, toes): None, normal, Trunk Movements Neck, shoulders, hips: None, normal, Overall Severity Severity of abnormal movements (highest score from questions above): None, normal Incapacitation due to abnormal movements: None, normal Patient's awareness of abnormal movements (rate only patient's report): No Awareness, Dental Status Current problems with teeth and/or dentures?: No Does patient usually wear dentures?: No  CIWA:  CIWA-Ar Total: 0 COWS:     Treatment Plan Summary: Daily contact with patient to assess and evaluate symptoms and progress in treatment Medication management  Plan: Treatment Plan/Recommendations:   1. Admit for crisis management and stabilization. 2. Medication management to reduce current symptoms to base line and improve the patient's overall level of functioning. Continue alcohol detox treatment program as scheduled and the medication management 3. Treat health problems as indicated. 4. Develop treatment plan to decrease risk of relapse upon  discharge and to reduce the need for readmission. 5. Psycho-social education regarding relapse prevention and self care. 6. Health care follow up as needed for medical problems. 7. Restart home medications where appropriate. 8. disposition plans in progress   Medical Decision Making Problem Points:  Established problem, worsening (2), Review of last therapy session (1) and Review of psycho-social stressors (1) Data Points:  Review or order clinical lab tests (1) Review or order medicine tests (1) Review of medication regiment & side effects (2) Review of new medications or change in dosage (2)  I certify that inpatient services furnished can reasonably be expected to improve the patient's condition.   Dorothy Polhemus,JANARDHAHA R. 02/18/2013, 12:11 PM

## 2013-02-19 DIAGNOSIS — F411 Generalized anxiety disorder: Secondary | ICD-10-CM

## 2013-02-19 DIAGNOSIS — F1020 Alcohol dependence, uncomplicated: Secondary | ICD-10-CM | POA: Diagnosis present

## 2013-02-19 MED ORDER — HYDROXYZINE HCL 50 MG PO TABS
50.0000 mg | ORAL_TABLET | Freq: Every day | ORAL | Status: DC
  Administered 2013-02-19: 50 mg via ORAL
  Filled 2013-02-19 (×3): qty 1
  Filled 2013-02-19: qty 14

## 2013-02-19 MED ORDER — PHENAZOPYRIDINE HCL 100 MG PO TABS
100.0000 mg | ORAL_TABLET | Freq: Three times a day (TID) | ORAL | Status: DC | PRN
  Filled 2013-02-19: qty 10

## 2013-02-19 NOTE — Progress Notes (Signed)
  D: Pt observed sleeping in bed with eyes closed. RR even and unlabored. No distress noted  .  A: Q 15 minute checks were done for safety.  R: safety maintained on unit.  

## 2013-02-19 NOTE — Progress Notes (Signed)
[facility] Group Notes:  (Nursing/MHT/Case Management/Adjunct)  Date:  02/19/2013  Time:  10:07 PM  Type of Therapy:  Group Therapy  Participation Level:  Active  Participation Quality:  Appropriate  Affect:  Appropriate  Cognitive:  Appropriate  Insight:  Appropriate  Engagement in Group:  Engaged  Modes of Intervention:  Discussion  Summary of Progress/Problems:The patient expressed that he learn that you are responsible for your choices.Also to not allow any one to make choices for you.  [name] 02/19/2013, 10:07 PM

## 2013-02-19 NOTE — Progress Notes (Addendum)
[facility] MD Progress Note  02/19/2013 1:42 PM Thomas Atkins  MRN:  [id number] Subjective:  Patient stated his sleep was "Ok", anxiety is high at bedtime--Vistaril 50 mg ordered, no withdrawal symptoms, complains of bladder spasms after urination--none today but painful yesterday--PRN Pyridium ordered.  Depression is "pretty good", discharge planned for tomorrow and follow-up with [facility]. Diagnosis:   DSM5:  Substance/Addictive Disorders:  Alcohol Related Disorder - Severe (303.90), Alcohol Intoxication with Use Disorder - Severe (F10.229) and Alcohol Withdrawal (291.81)  Axis I: Alcohol Abuse and Anxiety Disorder NOS Axis II: Deferred Axis III:  Past Medical History  Diagnosis Date  . Syncope   . Seizure disorder   . Alcoholism    Axis IV: other psychosocial or environmental problems, problems related to social environment and problems with primary support group Axis V: 41-50 serious symptoms  ADL's:  Intact  Sleep: Fair  Appetite:  Good  Suicidal Ideation:  Denies  Homicidal Ideation:  Denies   Psychiatric Specialty Exam: Review of Systems  Constitutional: Negative.   HENT: Negative.   Eyes: Negative.   Respiratory: Negative.   Cardiovascular: Negative.   Gastrointestinal: Negative.   Genitourinary: Negative.   Musculoskeletal: Negative.   Skin: Negative.   Neurological: Negative.   Endo/Heme/Allergies: Negative.   Psychiatric/Behavioral: Positive for substance abuse. The patient is nervous/anxious.     Blood pressure 121/83, pulse 79, temperature 97.2 F (36.2 C), temperature source Oral, resp. rate 18, height 6\' 1"  (1.854 m), weight 69.4 kg (153 lb), SpO2 94.00%.Body mass index is 20.19 kg/(m^2).  General Appearance: Casual  Eye Contact::  Fair  Speech:  Normal Rate  Volume:  Normal  Mood:  Anxious  Affect:  Congruent  Thought Process:  Coherent  Orientation:  Full (Time, Place, and Person)  Thought Content:  WDL  Suicidal Thoughts:  No  Homicidal  Thoughts:  No  Memory:  Immediate;   Fair Recent;   Fair Remote;   Fair  Judgement:  Fair  Insight:  Fair  Psychomotor Activity:  Normal  Concentration:  Fair  Recall:  Fair  Akathisia:  No  Handed:  Right  AIMS (if indicated):     Assets:  Leisure Time Physical Health Resilience  Sleep:  Number of Hours: 6.25   Current Medications: Current Facility-Administered Medications  Medication Dose Route Frequency Provider Last Rate Last Dose  . acetaminophen (TYLENOL) tablet 650 mg  650 mg Oral Q6H PRN Shuvon Rankin, NP   650 mg at 02/19/13 0845  . alum & mag hydroxide-simeth (MAALOX/MYLANTA) 200-200-20 MG/5ML suspension 30 mL  30 mL Oral Q4H PRN Shuvon Rankin, NP      . FLUoxetine (PROZAC) capsule 10 mg  10 mg Oral Daily Nehemiah Settle, MD   10 mg at 02/19/13 0845  . hydrOXYzine (ATARAX/VISTARIL) tablet 50 mg  50 mg Oral QHS Nanine Means, NP      . magnesium hydroxide (MILK OF MAGNESIA) suspension 30 mL  30 mL Oral Daily PRN Shuvon Rankin, NP      . multivitamin with minerals tablet 1 tablet  1 tablet Oral Daily Shuvon Rankin, NP   1 tablet at 02/19/13 0845  . nicotine (NICODERM CQ - dosed in mg/24 hours) patch 21 mg  21 mg Transdermal Daily Nehemiah Settle, MD   21 mg at 02/19/13 2130  . phenazopyridine (PYRIDIUM) tablet 100 mg  100 mg Oral TID WC PRN Nanine Means, NP      . thiamine (VITAMIN B-1) tablet 100 mg  100 mg  Oral Daily Nehemiah Settle, MD   100 mg at 02/19/13 0846  . traZODone (DESYREL) tablet 50 mg  50 mg Oral QHS,MR X 1 Kerry Hough, PA-C   50 mg at 02/18/13 2107    Lab Results:  Results for orders placed during the hospital encounter of 02/16/13 (from the past 48 hour(s))  URINALYSIS, ROUTINE W REFLEX MICROSCOPIC     Status: Abnormal   Collection Time    02/17/13  9:45 PM      Result Value Range   Color, Urine YELLOW  YELLOW   APPearance CLOUDY (*) CLEAR   Specific Gravity, Urine 1.025  1.005 - 1.030   pH 7.0  5.0 - 8.0   Glucose,  UA NEGATIVE  NEGATIVE mg/dL   Hgb urine dipstick NEGATIVE  NEGATIVE   Bilirubin Urine NEGATIVE  NEGATIVE   Ketones, ur NEGATIVE  NEGATIVE mg/dL   Protein, ur NEGATIVE  NEGATIVE mg/dL   Urobilinogen, UA 1.0  0.0 - 1.0 mg/dL   Nitrite NEGATIVE  NEGATIVE   Leukocytes, UA NEGATIVE  NEGATIVE   Comment: MICROSCOPIC NOT DONE ON URINES WITH NEGATIVE PROTEIN, BLOOD, LEUKOCYTES, NITRITE, OR GLUCOSE <1000 mg/dL.     Performed at [facility]    Physical Findings: AIMS: Facial and Oral Movements Muscles of Facial Expression: None, normal Lips and Perioral Area: None, normal Jaw: None, normal Tongue: None, normal,Extremity Movements Upper (arms, wrists, hands, fingers): None, normal Lower (legs, knees, ankles, toes): None, normal, Trunk Movements Neck, shoulders, hips: None, normal, Overall Severity Severity of abnormal movements (highest score from questions above): None, normal Incapacitation due to abnormal movements: None, normal Patient's awareness of abnormal movements (rate only patient's report): No Awareness, Dental Status Current problems with teeth and/or dentures?: No Does patient usually wear dentures?: No  CIWA:  CIWA-Ar Total: 0 COWS:     Treatment Plan Summary: Daily contact with patient to assess and evaluate symptoms and progress in treatment Medication management  Plan:  Review of chart, vital signs, medications, and notes. 1-Individual and group therapy 2-Medication management for depression, alcohol detox, and anxiety:  Medications reviewed with the patient and Pyridium PRN ordered for bladder spasms, Vistaril 50 mg at bedtime for anxiety ordered. 3-Coping skills for depression, anxiety, alcohol abuse 4-Continue crisis stabilization and management 5-Address health issues--monitoring vital signs, stable 6-Treatment plan in progress to prevent relapse of depression, alcohol, and anxiety  Medical Decision Making Problem Points:  Established problem,  stable/improving (1) and Review of psycho-social stressors (1) Data Points:  Review of new medications or change in dosage (2)  I certify that inpatient services furnished can reasonably be expected to improve the patient's condition.   Nanine Means, PMH-NP 02/19/2013, 1:42 PM
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
## 2013-02-19 NOTE — Progress Notes (Signed)
Psychoeducational Group Note  Date: 02/19/2013 Time:  1015  Group Topic/Focus:  Identifying Needs:   The focus of this group is to help patients identify their personal needs that have been historically problematic and identify healthy behaviors to address their needs.  Participation Level:  Active  Participation Quality:  Appropriate  Affect:  Appropriate  Cognitive:  Appropriate  Insight:  Engaged  Engagement in Group:  Engaged  Additional Comments:    Thomas Atkins  

## 2013-02-19 NOTE — Progress Notes (Signed)
Patient ID: Thomas Atkins, male   DOB: Jul 24, 1975, 37 y.o.   MRN: [id number] D)  Has been pleasant and cooperative this evening, did c/o headache earlier on this shift but was relieved with tylenol, was able to attend group and participate.  Came to med window afterward for hs meds, compliant.  States he feels he has his discharge plans in place. A)  Will continue to monitor for safety, continue POC R)  Safety maintained.

## 2013-02-19 NOTE — Progress Notes (Signed)
Thomas Atkins is OOB UAL on the unit today.Marland KitchenHe is less agitated, more at ease today. HE attends his groups, he seeks this nurses' attention and allows nurse to process with him.  A He takes his medications as scheduled, he completes his AM self inventory and on it he writes he denies SI within the past 24 hrs, he does not address his feelings of depression and / or hopelessness but he writes his DC plans are: " eat well, go to meetings and exercise".  R Safety is in place and poc maintaiend.

## 2013-02-20 DIAGNOSIS — F332 Major depressive disorder, recurrent severe without psychotic features: Secondary | ICD-10-CM

## 2013-02-20 LAB — URINALYSIS, DIPSTICK ONLY
Hgb urine dipstick: NEGATIVE
Ketones, ur: NEGATIVE mg/dL
Leukocytes, UA: NEGATIVE
Nitrite: NEGATIVE
Specific Gravity, Urine: 1.018 (ref 1.005–1.030)
pH: 6 (ref 5.0–8.0)

## 2013-02-20 MED ORDER — PHENAZOPYRIDINE HCL 100 MG PO TABS: 100.0000 mg | ORAL_TABLET | Freq: Three times a day (TID) | ORAL | Status: DC | PRN

## 2013-02-20 MED ORDER — HYDROXYZINE HCL 50 MG PO TABS: 50.0000 mg | ORAL_TABLET | Freq: Every day | ORAL | Status: DC

## 2013-02-20 MED ORDER — FLUOXETINE HCL 10 MG PO CAPS: 10.0000 mg | ORAL_CAPSULE | Freq: Every day | ORAL | Status: DC

## 2013-02-20 MED ORDER — TRAZODONE HCL 50 MG PO TABS: 50.0000 mg | ORAL_TABLET | Freq: Every evening | ORAL | Status: DC | PRN

## 2013-02-20 NOTE — Progress Notes (Signed)
D) Pt being discharged to home accompainied by his mother. Mood and affect are appropriate. Denies SI and HI, delusions and hallucinations. A) All belongings returned to Pt. All medications and follow up appointments explained. Samples given to Pt per orders. Given support, reassurance and praise. R) Pt denies SI and HI. Plans to follow up outpatient

## 2013-02-20 NOTE — Progress Notes (Signed)
Psychoeducational Group Note  Date: 02/20/2013 Time:0930  Group Topic/Focus:  Gratefulness:  The focus of this group is to help patients identify what two things they are most grateful for in their lives. What helps ground them and to center them on their work to their recovery.  Participation Level:  Active  Participation Quality:  Appropriate  Affect:  Appropriate  Cognitive:  Appropriate  Insight:  Improving  Engagement in Group:  Engaged  Additional Comments:    Milo Solana A   

## 2013-02-20 NOTE — [facility] Group Notes (Signed)
[facility] Group Notes: (Clinical Social Work)   02/20/2013      Type of Therapy:  Group Therapy   Participation Level:  Did Not Attend - already discharged   Ambrose Mantle, LCSW 02/20/2013, 1:34 PM    -

## 2013-02-20 NOTE — Progress Notes (Signed)
[facility] Adult Case Management Discharge Plan :  Will you be returning to the same living situation after discharge: Yes,  with friends At discharge, do you have transportation home?:Yes,  mother Do you have the ability to pay for your medications:No.  No insurance or employment  Release of information consent forms completed and in the chart;  Patient's signature needed at discharge.  Patient to Follow up at: Follow-up Information   [facility] Schedule an appointment as soon as possible for a visit on 02/23/2013. (Please go to [facility]'s walk in clinic on Wednesday, February 23, 2013 or any weekday between 8AM-12:00 Noon or 1:00 PM -3:00 PM for medication management and counseling)    Contact information:   [address] [place], [place]   [address]  [phone]      Patient denies SI/HI:   Yes,  has been    Aeronautical engineer and Suicide Prevention discussed:  Yes,  during weekday aftercare planning meetings  Sarina Ser 02/20/2013, 1:35 PM

## 2013-02-20 NOTE — Progress Notes (Signed)
Patient ID: Thomas Atkins, male   DOB: March 18, 1975, 37 y.o.   MRN: [id number]  D: Patient pleasant on approach this am. Reports discharging today. Rates depression and hopelessness "1" on scale. Currently denies any SI and states mood much improved from admission. A: Staff will monitor on q 15 minute checks, follow treatment plan, and give meds as ordered. R: States will discharge at 1pm

## 2013-02-20 NOTE — [facility] Suicide Risk Assessment (Signed)
Suicide Risk Assessment  Discharge Assessment     Demographic Factors:  Caucasian  Mental Status Per Nursing Assessment::   On Admission:  NA  Current Mental Status by Physician: No si, no hi, logical, good mood., no avh  Loss Factors: Financial problems/change in socioeconomic status  Historical Factors: Impulsivity  Risk Reduction Factors:   Positive social support  Continued Clinical Symptoms:  Alcohol/Substance Abuse/Dependencies  Cognitive Features That Contribute To Risk:  Closed-mindedness    Suicide Risk:  Minimal: No identifiable suicidal ideation.  Patients presenting with no risk factors but with morbid ruminations; may be classified as minimal risk based on the severity of the depressive symptoms  Discharge Diagnoses:   AXIS I:  Substance Abuse AXIS II:  Deferred AXIS III:   Past Medical History  Diagnosis Date  . Syncope   . Seizure disorder   . Alcoholism    AXIS IV:  other psychosocial or environmental problems AXIS V:  51-60 moderate symptoms  Plan Of Care/Follow-up recommendations:  Activity:  as tolerated  Is patient on multiple antipsychotic therapies at discharge:  No   Has Patient had three or more failed trials of antipsychotic monotherapy by history:  No  Recommended Plan for Multiple Antipsychotic Therapies: NA  Thomas Atkins 02/20/2013, 11:19 AM

## 2013-02-20 NOTE — Progress Notes (Signed)
Psychoeducational Group Note  Date:  02/20/2013 Time:  1015  Group Topic/Focus:  Making Healthy Choices:   The focus of this group is to help patients identify negative/unhealthy choices they were using prior to admission and identify positive/healthier coping strategies to replace them upon discharge.  Participation Level:  Active  Participation Quality:  Appropriate  Affect:  Appropriate  Cognitive:  Oriented  Insight:  Improving  Engagement in Group:  Engaged  Additional Comments:    Sheneka Schrom A 02/20/2013  

## 2013-02-20 NOTE — Discharge Summary (Signed)
Physician Discharge Summary Note  Patient:  Thomas Atkins is an 37 y.o., male MRN:  [id number] DOB:  December 09, 1975 Patient phone:  [phone] (home)  Patient address:   [address] [place] [address],   Date of Admission:  02/16/2013 Date of Discharge: 02/20/2013  Reason for Admission:  Alcohol detox/dependency  Discharge Diagnoses: Active Problems:   Depressive disorder   Alcohol dependence  Review of Systems  Constitutional: Negative.   HENT: Negative.   Eyes: Negative.   Respiratory: Negative.   Cardiovascular: Negative.   Gastrointestinal: Negative.   Genitourinary: Negative.   Musculoskeletal: Negative.   Skin: Negative.   Neurological: Negative.   Endo/Heme/Allergies: Negative.   Psychiatric/Behavioral: Positive for depression and substance abuse.    DSM5:  Substance/Addictive Disorders:  Alcohol Related Disorder - Severe (303.90), Alcohol Intoxication with Use Disorder - Severe (F10.229) and Alcohol Withdrawal (291.81) Depressive Disorders:  Major Depressive Disorder - Severe (296.23)  Axis Diagnosis:   AXIS I:  Alcohol Abuse, Anxiety Disorder NOS and Major Depression, Recurrent severe AXIS II:  Deferred AXIS III:   Past Medical History  Diagnosis Date  . Syncope   . Seizure disorder   . Alcoholism    AXIS IV:  other psychosocial or environmental problems, problems related to social environment and problems with primary support group AXIS V:  61-70 mild symptoms  Level of Care:  OP  Hospital Course:  On admission:  37 y.o. Male admitted voluntarily and emergently from [facility] emergency department after transferred from the [facility] for alcoholic dependence, alcohol withdrawal syndrome and substance induced mood disorder. Patient stated he was brought in by his uncle when he has requested help. Patient has requested detox from alcohol. He states that her has been drinking almost daily, every day since he was 10. He states that  he last drank alcohol about 2 hours ago. He states that he drinks beer only and that he does not drink liquor. He states that he has alcoholic shakes every morning, and then has to start drinking. He states that he drinks up to eight x 40 ounces a day. He also states that he occasionally has sweats and chills in the mornings occasionally which he attributes to withdrawal. He further states that he has been having some intermittent abdominal pain and diarrhea recently, and that he believes he may have had his first seizure this morning. He states that he has had multiple failed attempts at detox in the past. He also states that he has attended AA in the past, about 7 times. He has an ex-wife and 2 teenaged children who he does not get along. He endorses symptoms of depression and he denies suicidal ideation and homicidal ideation intention or plans. He states that he does not use IV drugs or any other substances of abuse. He is occasionally smoking marijuana.   During hospitalization:  [facility] alcohol detox protocol implemented successfully.  Medications managed---Prozac 10 mg daily for depression, Vistaril 50 mg at bedtime for anxiety, Pyridium 100 mg PRN for bladder spasms, and Trazodone 50 mg at bedtime PRN sleep started.  Thomas Atkins attended and participated in groups.  He denied suicidal/homicidal ideations and auditory/visual hallucinations, follow-up appointments encouraged to attend, outside support groups encouraged and information given, Rx and 14 day supply of medications given.  Thomas Atkins is mentally and physically stable for discharge.  Consults:  None  Significant Diagnostic Studies:  labs: completed, reviewed, stable  Discharge Vitals:   Blood pressure 96/66, pulse 106, temperature 97.5  F (36.4 C), temperature source Oral, resp. rate 18, height 6\' 1"  (1.854 m), weight 69.4 kg (153 lb), SpO2 94.00%. Body mass index is 20.19 kg/(m^2). Lab Results:   Results for orders placed during the hospital  encounter of 02/16/13 (from the past 72 hour(s))  URINALYSIS, ROUTINE W REFLEX MICROSCOPIC     Status: Abnormal   Collection Time    02/17/13  9:45 PM      Result Value Range   Color, Urine YELLOW  YELLOW   APPearance CLOUDY (*) CLEAR   Specific Gravity, Urine 1.025  1.005 - 1.030   pH 7.0  5.0 - 8.0   Glucose, UA NEGATIVE  NEGATIVE mg/dL   Hgb urine dipstick NEGATIVE  NEGATIVE   Bilirubin Urine NEGATIVE  NEGATIVE   Ketones, ur NEGATIVE  NEGATIVE mg/dL   Protein, ur NEGATIVE  NEGATIVE mg/dL   Urobilinogen, UA 1.0  0.0 - 1.0 mg/dL   Nitrite NEGATIVE  NEGATIVE   Leukocytes, UA NEGATIVE  NEGATIVE   Comment: MICROSCOPIC NOT DONE ON URINES WITH NEGATIVE PROTEIN, BLOOD, LEUKOCYTES, NITRITE, OR GLUCOSE <1000 mg/dL.     Performed at [facility]  URINALYSIS, DIPSTICK ONLY     Status: None   Collection Time    02/19/13  6:19 PM      Result Value Range   Specific Gravity, Urine 1.018  1.005 - 1.030   pH 6.0  5.0 - 8.0   Glucose, UA NEGATIVE  NEGATIVE mg/dL   Hgb urine dipstick NEGATIVE  NEGATIVE   Bilirubin Urine NEGATIVE  NEGATIVE   Ketones, ur NEGATIVE  NEGATIVE mg/dL   Protein, ur NEGATIVE  NEGATIVE mg/dL   Urobilinogen, UA 0.2  0.0 - 1.0 mg/dL   Nitrite NEGATIVE  NEGATIVE   Leukocytes, UA NEGATIVE  NEGATIVE   Comment: Performed at [facility]    Physical Findings: AIMS: Facial and Oral Movements Muscles of Facial Expression: None, normal Lips and Perioral Area: None, normal Jaw: None, normal Tongue: None, normal,Extremity Movements Upper (arms, wrists, hands, fingers): None, normal Lower (legs, knees, ankles, toes): None, normal, Trunk Movements Neck, shoulders, hips: None, normal, Overall Severity Severity of abnormal movements (highest score from questions above): None, normal Incapacitation due to abnormal movements: None, normal Patient's awareness of abnormal movements (rate only patient's report): No Awareness, Dental Status Current  problems with teeth and/or dentures?: No Does patient usually wear dentures?: No  CIWA:  CIWA-Ar Total: 0 COWS:     Psychiatric Specialty Exam: See Psychiatric Specialty Exam and Suicide Risk Assessment completed by Attending Physician prior to discharge.  Discharge destination:  Home  Is patient on multiple antipsychotic therapies at discharge:  No   Has Patient had three or more failed trials of antipsychotic monotherapy by history:  No  Recommended Plan for Multiple Antipsychotic Therapies: NA  Discharge Orders   Future Orders Complete By Expires   Activity as tolerated - No restrictions  As directed    Diet - low sodium heart healthy  As directed        Medication List    STOP taking these medications       chlordiazePOXIDE 25 MG capsule  Commonly known as:  LIBRIUM      TAKE these medications     Indication   FLUoxetine 10 MG capsule  Commonly known as:  PROZAC  Take 1 capsule (10 mg total) by mouth daily.   Indication:  Depression     hydrOXYzine 50 MG tablet  Commonly known  as:  ATARAX/VISTARIL  Take 1 tablet (50 mg total) by mouth at bedtime.   Indication:  Anxiety Neurosis     phenazopyridine 100 MG tablet  Commonly known as:  PYRIDIUM  Take 1 tablet (100 mg total) by mouth 3 (three) times daily with meals as needed (bladder spasms).   Indication:  Bladder Lining Irritation     traZODone 50 MG tablet  Commonly known as:  DESYREL  Take 1 tablet (50 mg total) by mouth at bedtime and may repeat dose one time if needed.   Indication:  Excessive Use of Alcohol, Trouble Sleeping           Follow-up Information   [facility] with [facility] Schedule an appointment as soon as possible for a visit on 02/23/2013. (Please go to [facility]'s walk in clinic on Wednesday, February 23, 2013 or any weekday between 8AM-12:00 Noon or 1:00 PM -3:00 PM for medication management and counseling)    Contact information:   [address] [place], [place]    [address]  [phone]      Follow-up recommendations:  Activity:  as tolerated Diet:  low-sodium heart healthy diet  Comments:  Patient will [facility] at [facility], and AA groups.  Total Discharge Time:  Greater than 30 minutes.  SignedNanine Means, PMH-NP 02/20/2013, 9:30 AM

## 2013-02-24 NOTE — Progress Notes (Signed)
Patient Discharge Instructions:  After Visit Summary (AVS):   Faxed to:  02/24/13 Discharge Summary Note:   Faxed to:  02/24/13 Psychiatric Admission Assessment Note:   Faxed to:  02/24/13 Suicide Risk Assessment - Discharge Assessment:   Faxed to:  02/24/13 Faxed/Sent to the Next Level Care provider:  02/24/13 Faxed to [facility] the [facility] @ [phone]  Thomas Atkins, 02/24/2013, 4:22 PM

## 2013-06-29 ENCOUNTER — Emergency Department (HOSPITAL_COMMUNITY)
Admission: EM | Admit: 2013-06-29 | Discharge: 2013-06-29 | Disposition: A | Discharge status: Other Acute Inpatient Hospital | Payer: Federal, State, Local not specified - Other | Attending: Emergency Medicine | Admitting: Emergency Medicine

## 2013-06-29 ENCOUNTER — Encounter (HOSPITAL_COMMUNITY): Payer: Self-pay | Admitting: Emergency Medicine

## 2013-06-29 ENCOUNTER — Inpatient Hospital Stay (HOSPITAL_COMMUNITY)
Admission: AD | Admit: 2013-06-29 | Discharge: 2013-07-04 | DRG: 885 | Disposition: A | Discharge status: Home / Self Care | Payer: Federal, State, Local not specified - Other | Source: Intra-hospital | Attending: Psychiatry | Admitting: Psychiatry

## 2013-06-29 ENCOUNTER — Encounter (HOSPITAL_COMMUNITY): Payer: Self-pay | Admitting: *Deleted

## 2013-06-29 DIAGNOSIS — F1020 Alcohol dependence, uncomplicated: Secondary | ICD-10-CM | POA: Diagnosis present

## 2013-06-29 DIAGNOSIS — R45850 Homicidal ideations: Secondary | ICD-10-CM

## 2013-06-29 DIAGNOSIS — F411 Generalized anxiety disorder: Secondary | ICD-10-CM | POA: Diagnosis present

## 2013-06-29 DIAGNOSIS — F332 Major depressive disorder, recurrent severe without psychotic features: Principal | ICD-10-CM | POA: Diagnosis present

## 2013-06-29 DIAGNOSIS — F1994 Other psychoactive substance use, unspecified with psychoactive substance-induced mood disorder: Secondary | ICD-10-CM | POA: Diagnosis present

## 2013-06-29 DIAGNOSIS — F329 Major depressive disorder, single episode, unspecified: Secondary | ICD-10-CM | POA: Insufficient documentation

## 2013-06-29 DIAGNOSIS — F3289 Other specified depressive episodes: Secondary | ICD-10-CM | POA: Insufficient documentation

## 2013-06-29 DIAGNOSIS — F32A Depression, unspecified: Secondary | ICD-10-CM

## 2013-06-29 DIAGNOSIS — F331 Major depressive disorder, recurrent, moderate: Secondary | ICD-10-CM

## 2013-06-29 DIAGNOSIS — F3110 Bipolar disorder, current episode manic without psychotic features, unspecified: Secondary | ICD-10-CM | POA: Diagnosis present

## 2013-06-29 DIAGNOSIS — Z8669 Personal history of other diseases of the nervous system and sense organs: Secondary | ICD-10-CM | POA: Insufficient documentation

## 2013-06-29 DIAGNOSIS — F17200 Nicotine dependence, unspecified, uncomplicated: Secondary | ICD-10-CM | POA: Diagnosis present

## 2013-06-29 DIAGNOSIS — G40909 Epilepsy, unspecified, not intractable, without status epilepticus: Secondary | ICD-10-CM | POA: Diagnosis present

## 2013-06-29 DIAGNOSIS — Z8249 Family history of ischemic heart disease and other diseases of the circulatory system: Secondary | ICD-10-CM

## 2013-06-29 DIAGNOSIS — F1010 Alcohol abuse, uncomplicated: Secondary | ICD-10-CM

## 2013-06-29 DIAGNOSIS — F1094 Alcohol use, unspecified with alcohol-induced mood disorder: Secondary | ICD-10-CM | POA: Diagnosis present

## 2013-06-29 DIAGNOSIS — Z79899 Other long term (current) drug therapy: Secondary | ICD-10-CM | POA: Insufficient documentation

## 2013-06-29 DIAGNOSIS — IMO0002 Reserved for concepts with insufficient information to code with codable children: Secondary | ICD-10-CM | POA: Insufficient documentation

## 2013-06-29 LAB — RAPID URINE DRUG SCREEN, HOSP PERFORMED
Amphetamines: NOT DETECTED
Barbiturates: NOT DETECTED
Benzodiazepines: NOT DETECTED
Cocaine: NOT DETECTED
Opiates: NOT DETECTED
Tetrahydrocannabinol: NOT DETECTED

## 2013-06-29 LAB — COMPREHENSIVE METABOLIC PANEL
ALK PHOS: 66 U/L (ref 39–117)
ALT: 29 U/L (ref 0–53)
AST: 35 U/L (ref 0–37)
Albumin: 3.5 g/dL (ref 3.5–5.2)
BUN: 14 mg/dL (ref 6–23)
CO2: 22 mEq/L (ref 19–32)
Calcium: 9.1 mg/dL (ref 8.4–10.5)
Chloride: 101 mEq/L (ref 96–112)
Creatinine, Ser: 0.87 mg/dL (ref 0.50–1.35)
GFR calc non Af Amer: >90 mL/min (ref >90)
GLUCOSE: 99 mg/dL (ref 70–99)
POTASSIUM: 4.5 meq/L (ref 3.7–5.3)
Sodium: 137 mEq/L (ref 137–147)
Total Bilirubin: 0.2 mg/dL — ABNORMAL LOW (ref 0.3–1.2)
Total Protein: 6.6 g/dL (ref 6.0–8.3)

## 2013-06-29 LAB — CBC
HEMATOCRIT: 42.2 % (ref 39.0–52.0)
HEMOGLOBIN: 14.8 g/dL (ref 13.0–17.0)
MCH: 32.5 pg (ref 26.0–34.0)
MCHC: 35.1 g/dL (ref 30.0–36.0)
MCV: 92.5 fL (ref 78.0–100.0)
Platelets: 214 10*3/uL (ref 150–400)
RBC: 4.56 MIL/uL (ref 4.22–5.81)
RDW: 13.5 % (ref 11.5–15.5)
WBC: 8.2 10*3/uL (ref 4.0–10.5)

## 2013-06-29 LAB — ETHANOL: Alcohol, Ethyl (B): 161 mg/dL — ABNORMAL HIGH (ref 0–11)

## 2013-06-29 MED ORDER — ALUM & MAG HYDROXIDE-SIMETH 200-200-20 MG/5ML PO SUSP: 30.0000 mL | ORAL | Status: DC | PRN

## 2013-06-29 MED ORDER — VITAMIN B-1 100 MG PO TABS
100.0000 mg | ORAL_TABLET | Freq: Every day | ORAL | Status: DC
  Administered 2013-06-30 – 2013-07-04 (×5): 100 mg via ORAL
  Filled 2013-06-29 (×8): qty 1

## 2013-06-29 MED ORDER — ADULT MULTIVITAMIN W/MINERALS CH
1.0000 | ORAL_TABLET | Freq: Every day | ORAL | Status: DC
  Administered 2013-06-30 – 2013-07-04 (×5): 1 via ORAL
  Filled 2013-06-29 (×8): qty 1

## 2013-06-29 MED ORDER — ACETAMINOPHEN 325 MG PO TABS
650.0000 mg | ORAL_TABLET | Freq: Four times a day (QID) | ORAL | Status: DC | PRN
Start: 2013-06-29 — End: 2013-07-04

## 2013-06-29 MED ORDER — CHLORDIAZEPOXIDE HCL 25 MG PO CAPS
25.0000 mg | ORAL_CAPSULE | Freq: Three times a day (TID) | ORAL | Status: AC
  Administered 2013-07-01 – 2013-07-02 (×3): 25 mg via ORAL
  Filled 2013-06-29 (×3): qty 1

## 2013-06-29 MED ORDER — OLANZAPINE 5 MG PO TBDP: 15.0000 mg | ORAL_TABLET | Freq: Every day | ORAL | Status: DC

## 2013-06-29 MED ORDER — LOPERAMIDE HCL 2 MG PO CAPS: 2.0000 mg | ORAL_CAPSULE | ORAL | Status: AC | PRN

## 2013-06-29 MED ORDER — HALOPERIDOL 5 MG PO TABS
5.0000 mg | ORAL_TABLET | Freq: Four times a day (QID) | ORAL | Status: DC | PRN
  Administered 2013-06-29: 5 mg via ORAL
  Filled 2013-06-29: qty 1

## 2013-06-29 MED ORDER — MAGNESIUM HYDROXIDE 400 MG/5ML PO SUSP: 30.0000 mL | Freq: Every day | ORAL | Status: DC | PRN

## 2013-06-29 MED ORDER — ONDANSETRON 4 MG PO TBDP: 4.0000 mg | ORAL_TABLET | Freq: Four times a day (QID) | ORAL | Status: AC | PRN

## 2013-06-29 MED ORDER — CHLORDIAZEPOXIDE HCL 25 MG PO CAPS
25.0000 mg | ORAL_CAPSULE | Freq: Every day | ORAL | Status: DC
  Filled 2013-06-29: qty 1

## 2013-06-29 MED ORDER — TRAZODONE HCL 50 MG PO TABS
50.0000 mg | ORAL_TABLET | Freq: Every evening | ORAL | Status: DC | PRN
  Administered 2013-06-29: 50 mg via ORAL
  Filled 2013-06-29 (×5): qty 1

## 2013-06-29 MED ORDER — CHLORDIAZEPOXIDE HCL 25 MG PO CAPS: 25.0000 mg | ORAL_CAPSULE | Freq: Four times a day (QID) | ORAL | Status: AC | PRN

## 2013-06-29 MED ORDER — HYDROXYZINE HCL 25 MG PO TABS: 25.0000 mg | ORAL_TABLET | Freq: Four times a day (QID) | ORAL | Status: DC | PRN

## 2013-06-29 MED ORDER — CHLORDIAZEPOXIDE HCL 25 MG PO CAPS
25.0000 mg | ORAL_CAPSULE | Freq: Four times a day (QID) | ORAL | Status: AC
  Administered 2013-06-29 – 2013-07-01 (×6): 25 mg via ORAL
  Filled 2013-06-29 (×6): qty 1

## 2013-06-29 MED ORDER — THIAMINE HCL 100 MG/ML IJ SOLN
100.0000 mg | Freq: Once | INTRAMUSCULAR | Status: AC
  Administered 2013-06-29: 100 mg via INTRAMUSCULAR
  Filled 2013-06-29: qty 2

## 2013-06-29 MED ORDER — CHLORDIAZEPOXIDE HCL 25 MG PO CAPS
25.0000 mg | ORAL_CAPSULE | ORAL | Status: AC
  Filled 2013-06-29: qty 1

## 2013-06-29 MED ORDER — OLANZAPINE 5 MG PO TBDP
15.0000 mg | ORAL_TABLET | Freq: Every day | ORAL | Status: DC
  Filled 2013-06-29 (×2): qty 1

## 2013-06-29 NOTE — ED Notes (Signed)
Pt sts "my therapist and psychiatrist told me to come in, because my meds needs to be worked on" and HI w/ plan "to hurt anyone that annoys me in anyway possible."  Denies SI.  Pt reports that med were changed x 3 months ago and [facility].

## 2013-06-29 NOTE — ED Notes (Signed)
Pt aaox3.  Pt calm and cooperative.  Pt reports "needing a change in my medicines.  [facility] fior me and my psychiatrist changed them up."  Pt denies SI/AH/VH.  Pt reports feeling HI towards his father.  Pt reports increased agitation towards the public.  Pt reports "I feel like i am about to explode.  everything is bothering me.   If I see a woman on her phone and trying to pay her bus fare at the same time, I get very irritated."    Pt currently sees a Psychiatrist and was advised to come to the Emergency room for evaluation.  [facility].  Will continue to monitor.

## 2013-06-29 NOTE — ED Provider Notes (Signed)
Medical screening examination/treatment/procedure(s) were performed by non-physician practitioner and as supervising physician I was immediately available for consultation/collaboration.   EKG Interpretation None        Layla MawKristen N Doral Digangi, DO 06/29/13 1500

## 2013-06-29 NOTE — Consult Note (Signed)
[facility] Face-to-Face Psychiatry Consult   Reason for Consult:  Homicidal thoughts Referring Physician:  ER MD  Thomas Atkins is an 38 y.o. male. Total Time spent with patient: 45 minutes  Assessment: AXIS I:  major depression, recurrent, moderate AXIS II:  Deferred AXIS III:   Past Medical History  Diagnosis Date  . Syncope   . Seizure disorder   . Alcoholism    AXIS IV:  other psychosocial or environmental problems AXIS V:  51-60 moderate symptoms  Plan:  Recommend psychiatric Inpatient admission when medically cleared.  Subjective:   Thomas Atkins is a 38 y.o. male patient admitted with homicidal thoughts.  HPI:  Thomas Atkins says he has had homicidal thoughts towards his father for years.  The thoughts have gotten worse recently and he saw his psychiatrist and therapist and expressed wishes to hurt/kill even strangers on the street.  Every body just seems "ignorant and disrespectful" and he has the urge to hurt them.  He is afraid he is about to lose it.  Meds were changed recently and they do no seem to work as well.  His father is a heroin addict and that frustrates him and angers him.  He hates feeling this way but cannot help himself, he says.  He is depressed but says he is not suicidal. HPI Elements:   Location:  depression . Quality:  irritable and angry at the world. Severity:  afraid he is gong to lose control and try to kill or hurt someone. Timing:  just built up, he says. Duration:  just a few weeks this bad. Context:  depressed, lonely and frustrated with his father.  Past Psychiatric History: Past Medical History  Diagnosis Date  . Syncope   . Seizure disorder   . Alcoholism     reports that he has been smoking Cigarettes.  He has been smoking about 0.50 packs per day. He does not have any smokeless tobacco history on file. He reports that he drinks alcohol. He reports that he does not use illicit drugs. Family History  Problem Relation Age of Onset  . Heart disease  Thomas Atkins            Allergies:   Allergies  Allergen Reactions  . Ultram [Tramadol Hcl] Hives         ACT Assessment Complete:  Yes:    Educational Status    Risk to Self: Risk to self Is patient at risk for suicide?: No, but patient needs Medical Clearance Substance abuse history and/or treatment for substance abuse?: No  Risk to Others:    Abuse:    Prior Inpatient Therapy:    Prior Outpatient Therapy:    Additional Information:                    Objective: Blood pressure 123/84, pulse 90, temperature 97.9 F (36.6 C), temperature source Oral, resp. rate 17, SpO2 95.00%.There is no weight on file to calculate BMI. Results for orders placed during the hospital encounter of 06/29/13 (from the past 72 hour(s))  CBC     Status: None   Collection Time    06/29/13  1:37 PM      Result Value Ref Range   WBC 8.2  4.0 - 10.5 K/uL   RBC 4.56  4.22 - 5.81 MIL/uL   Hemoglobin 14.8  13.0 - 17.0 g/dL   HCT 42.2  39.0 - 52.0 %   MCV 92.5  78.0 - 100.0 fL  MCH 32.5  26.0 - 34.0 pg   MCHC 35.1  30.0 - 36.0 g/dL   RDW 13.5  11.5 - 15.5 %   Platelets 214  150 - 400 K/uL  COMPREHENSIVE METABOLIC PANEL     Status: Abnormal   Collection Time    06/29/13  1:37 PM      Result Value Ref Range   Sodium 137  137 - 147 mEq/L   Potassium 4.5  3.7 - 5.3 mEq/L   Chloride 101  96 - 112 mEq/L   CO2 22  19 - 32 mEq/L   Glucose, Bld 99  70 - 99 mg/dL   BUN 14  6 - 23 mg/dL   Creatinine, Ser 0.87  0.50 - 1.35 mg/dL   Calcium 9.1  8.4 - 10.5 mg/dL   Total Protein 6.6  6.0 - 8.3 g/dL   Albumin 3.5  3.5 - 5.2 g/dL   AST 35  0 - 37 U/L   ALT 29  0 - 53 U/L   Alkaline Phosphatase 66  39 - 117 U/L   Total Bilirubin 0.2 (*) 0.3 - 1.2 mg/dL   GFR calc non Af Amer >90  >90 mL/min   GFR calc Af Amer >90  >90 mL/min   Comment: (NOTE)     The eGFR has been calculated using the CKD EPI equation.     This calculation has not been validated in all clinical situations.      eGFR's persistently <90 mL/min signify possible Chronic Kidney     Disease.  ETHANOL     Status: Abnormal   Collection Time    06/29/13  1:37 PM      Result Value Ref Range   Alcohol, Ethyl (B) 161 (*) 0 - 11 mg/dL   Comment:            LOWEST DETECTABLE LIMIT FOR     SERUM ALCOHOL IS 11 mg/dL     FOR MEDICAL PURPOSES ONLY  URINE RAPID DRUG SCREEN (HOSP PERFORMED)     Status: None   Collection Time    06/29/13  1:46 PM      Result Value Ref Range   Opiates NONE DETECTED  NONE DETECTED   Cocaine NONE DETECTED  NONE DETECTED   Benzodiazepines NONE DETECTED  NONE DETECTED   Amphetamines NONE DETECTED  NONE DETECTED   Tetrahydrocannabinol NONE DETECTED  NONE DETECTED   Barbiturates NONE DETECTED  NONE DETECTED   Comment:            DRUG SCREEN FOR MEDICAL PURPOSES     ONLY.  IF CONFIRMATION IS NEEDED     FOR ANY PURPOSE, NOTIFY LAB     WITHIN 5 DAYS.                LOWEST DETECTABLE LIMITS     FOR URINE DRUG SCREEN     Drug Class       Cutoff (ng/mL)     Amphetamine      1000     Barbiturate      200     Benzodiazepine   867     Tricyclics       544     Opiates          300     Cocaine          300     THC              50   Labs  are reviewed and are pertinent for no psychiatric issues.  No current facility-administered medications for this encounter.   Current Outpatient Prescriptions  Medication Sig Dispense Refill  . diphenhydramine-acetaminophen (TYLENOL PM) 25-500 MG TABS Take 1 tablet by mouth at bedtime as needed.      . olanzapine zydis (ZYPREXA) 15 MG disintegrating tablet Take 15 mg by mouth at bedtime.        Psychiatric Specialty Exam:     Blood pressure 123/84, pulse 90, temperature 97.9 F (36.6 C), temperature source Oral, resp. rate 17, SpO2 95.00%.There is no weight on file to calculate BMI.  General Appearance: Well Groomed  Engineer, water::  Good  Speech:  Clear and Coherent  Volume:  Normal  Mood:  Angry and Depressed  Affect:  Tearful  Thought  Process:  Coherent and Logical  Orientation:  Full (Time, Place, and Person)  Thought Content:  does hear one syllable words off and on during the day but no talking  Suicidal Thoughts:  No  Homicidal Thoughts:  Yes.  with intent/plan  Memory:  Immediate;   Good Recent;   Good Remote;   Good  Judgement:  Impaired  Insight:  Fair  Psychomotor Activity:  Normal  Concentration:  Good  Recall:  Good  Fund of Knowledge:Good  Language: Good  Akathisia:  Negative  Handed:  Right  AIMS (if indicated):     Assets:  Communication Skills Desire for Improvement Financial Resources/Insurance Housing Physical Health Transportation  Sleep:      Musculoskeletal: Strength & Muscle Tone: within normal limits Gait & Station: normal Patient leans: N/A  Treatment Plan Summary: needs inpatient to treat his depression and homicidal ideation at whatever facility is available  Clarene Reamer 06/29/2013 2:45 PM

## 2013-06-29 NOTE — BH Assessment (Signed)
TTS was notified by Julieanne CottonAC Tina at [facility]. [facility] Pt's support paperwork is complete to include Admission Checklist, ROI, and Voluntary Consult. Pt is assigned to bed 503-1. Pt's nurse Antoinekia [facility]. [facility].   Glorious PeachNajah Jorgina Binning, MS, LCASA Assessment Counselor

## 2013-06-29 NOTE — Progress Notes (Signed)
Patient ID: Thomas Atkins, male   DOB: Nov 20, 1975, 38 y.o.   MRN: [id number] Pt is a 38 yo male referred here by his physician for homicidal thoughts toward his father, pt. Reports is father is addicted to drugs, reportedly heroin. Pt. Reports "I had a breakdown, things building up, I went to my therapist and told him I wanted to kill somebody, society getting on my nerves, the [facility] at [facility], everybody so disrespectful" "but I wouldn't hurt nobody that's why I came here" "my doctor put me on Zyprexa, I'm not going to take it anymore, it's suppose to help me sleep, but it makes me have restless legs and I don't sleep, I told her and she keep putting me on it, I was doing well on the medicine y'all gave me when I was discharged from here last time, I was enjoying those. I was on trazodone and vistaril to help me sleep, and Prozac, she took me off all of that" Pt. [facility]. Pt. Reports "hear voices sometimes" Pt. Has a history of alcoholism, reports he drinks occasionally now, but not like before. "I drink a beer here and there" (maybe minimizing) says his last drink was last pm. Pt. Has a medical history of syncope and seizure disorder, also has pectus excavatum (funnel chest) which can cause.  symptoms i.e. Tachycardia, shortness of breath, CP, etc. Pt. Offered food and drink, but refuse d/t nausea/vomiting. Pt. Oriented to unit/room. Pt. Made Do Not Admit due to homicidal thoughts towards everyone. Staff will monitor q9715min for safety.

## 2013-06-29 NOTE — Tx Team (Signed)
Initial Interdisciplinary Treatment Plan  PATIENT STRENGTHS: (choose at least two) Ability for insight Communication skills General fund of knowledge Supportive family/friends  PATIENT STRESSORS: Marital or family conflict Medication change or noncompliance Substance abuse   PROBLEM LIST: Problem List/Patient Goals Date to be addressed Date deferred Reason deferred Estimated date of resolution  HI 06-29-13     Substance abuse (ETOH) 06-29-13     Anxiety 06-29-13     AH 06-29-13     Noncompliance 06-29-13                              DISCHARGE CRITERIA:  Improved stabilization in mood, thinking, and/or behavior Need for constant or close observation no longer present Reduction of life-threatening or endangering symptoms to within safe limits Verbal commitment to aftercare and medication compliance Withdrawal symptoms are absent or subacute and managed without 24-hour nursing intervention  PRELIMINARY DISCHARGE PLAN: Attend 12-step recovery group Participate in family therapy Placement in alternative living arrangements  PATIENT/FAMIILY INVOLVEMENT: This treatment plan has been presented to and reviewed with the patient, Thomas Atkins, and/or family member.  The patient and family have been given the opportunity to ask questions and make suggestions.  Mickeal NeedySandra N Emmajo Bennette 06/29/2013, 11:02 PM

## 2013-06-29 NOTE — Consult Note (Signed)
Review of Systems   Constitutional: Negative.    HENT: Negative.    Eyes: Negative.    Respiratory: Negative.    Cardiovascular: Negative.    Gastrointestinal: Negative.    Genitourinary: Negative.    Musculoskeletal: Negative.    Skin: Negative.    Neurological: Negative.    Endo/Heme/Allergies: Negative.    Psychiatric/Behavioral: Positive for depression.

## 2013-06-29 NOTE — ED Provider Notes (Signed)
CSN: [id number]     Arrival date & time 06/29/13  1254 History  This chart was scribed for non-physician practitioner,Si Jachim Rubin PayorPickering, NP, working with Layla MawKristen N Ward, DO by Charline BillsEssence Howell, ED Scribe. This patient was seen in room WTR4/WLPT4 and the patient's care was started at 1:17 PM.    Chief Complaint  Patient presents with  . Medical Clearance    HPI HPI Comments: Thomas BlueMark P Atkins is a 38 y.o. male who presents to the Emergency Department for medical clearance. Pt saw his therapist and psychiatrist earlier today. They referred him here for medical clearance in order to be [facility] to [facility]. Pt's medication was changed 3 months ago to Zyprexa, which he takes regularly.pt states that he used to take 3 different medications and they worked so he doesn't know why they changed him. Pt reports lack of sleep and leg pain. He denies SI but reports HI. He states that he has always wanted to kill his father since he was a child, father is a heroin addict. Pt also reports "part-time" drinking. Pt states that he had a lot to drink last night.   Past Medical History  Diagnosis Date  . Syncope   . Seizure disorder   . Alcoholism    Past Surgical History  Procedure Laterality Date  . Surgery scrotal / testicular     Family History  Problem Relation Age of Onset  . Heart disease Paternal Grandmother    History  Substance Use Topics  . Smoking status: Current Every Day Smoker -- 0.50 packs/day  . Smokeless tobacco: Not on file  . Alcohol Use: 14.4 oz/week    24 Cans of beer per week     Comment: drinks case beer daily    Review of Systems  Psychiatric/Behavioral: Positive for agitation. Negative for suicidal ideas.    Allergies  Ultram  Home Medications   Prior to Admission medications   Medication Sig Start Date End Date Taking? Authorizing Provider  FLUoxetine (PROZAC) 10 MG capsule Take 1 capsule (10 mg total) by mouth daily. 02/20/13   Nanine MeansJamison Lord, NP  hydrOXYzine  (ATARAX/VISTARIL) 50 MG tablet Take 1 tablet (50 mg total) by mouth at bedtime. 02/20/13   Nanine MeansJamison Lord, NP  phenazopyridine (PYRIDIUM) 100 MG tablet Take 1 tablet (100 mg total) by mouth 3 (three) times daily with meals as needed (bladder spasms). 02/20/13   Nanine MeansJamison Lord, NP  traZODone (DESYREL) 50 MG tablet Take 1 tablet (50 mg total) by mouth at bedtime and may repeat dose one time if needed. 02/20/13   Nanine MeansJamison Lord, NP   Triage Vitals: BP 123/84  Pulse 90  Temp(Src) 97.9 F (36.6 C) (Oral)  Resp 17  SpO2 95% Physical Exam  Nursing note and vitals reviewed. Constitutional: He is oriented to person, place, and time. He appears well-developed and well-nourished.  HENT:  Head: Normocephalic and atraumatic.  Eyes: EOM are normal.  Neck: Neck supple.  Cardiovascular: Normal rate.   Pulmonary/Chest: Effort normal.  Musculoskeletal: Normal range of motion.  Neurological: He is alert and oriented to person, place, and time.  Skin: Skin is warm and dry.  Psychiatric: He is agitated. He expresses homicidal ideation. He expresses no suicidal ideation. He expresses no suicidal plans and no homicidal plans.    ED Course  Procedures (including critical care time) DIAGNOSTIC STUDIES: Oxygen Saturation is 95% on RA, adequate by my interpretation.    COORDINATION OF CARE: 1:23 PM-Discussed treatment plan with pt at bedside and pt agreed to  plan.   Labs Review Labs Reviewed - No data to display  Imaging Review No results found.   EKG Interpretation None      MDM   Final diagnoses:  None    Pt to evaluated by tts  I personally performed the services described in this documentation, which was scribed in my presence. The recorded information has been reviewed and is accurate.    Teressa LowerVrinda Andjela Wickes, NP 06/29/13 1346

## 2013-06-30 DIAGNOSIS — F1020 Alcohol dependence, uncomplicated: Secondary | ICD-10-CM

## 2013-06-30 DIAGNOSIS — F329 Major depressive disorder, single episode, unspecified: Secondary | ICD-10-CM

## 2013-06-30 DIAGNOSIS — F3289 Other specified depressive episodes: Secondary | ICD-10-CM

## 2013-06-30 DIAGNOSIS — F10988 Alcohol use, unspecified with other alcohol-induced disorder: Secondary | ICD-10-CM

## 2013-06-30 LAB — TSH: TSH: 0.944 u[IU]/mL (ref 0.350–4.500)

## 2013-06-30 MED ORDER — HYDROXYZINE HCL 50 MG PO TABS
25.0000 mg | ORAL_TABLET | Freq: Four times a day (QID) | ORAL | Status: DC | PRN
  Administered 2013-06-30 – 2013-07-04 (×12): 25 mg via ORAL
  Filled 2013-06-30: qty 20
  Filled 2013-06-30 (×12): qty 1

## 2013-06-30 MED ORDER — NICOTINE 21 MG/24HR TD PT24
21.0000 mg | MEDICATED_PATCH | Freq: Every day | TRANSDERMAL | Status: DC
Start: 2013-06-30 — End: 2013-07-04
  Administered 2013-06-30 – 2013-07-04 (×5): 21 mg via TRANSDERMAL
  Filled 2013-06-30 (×8): qty 1

## 2013-06-30 MED ORDER — FLUOXETINE HCL 10 MG PO CAPS
10.0000 mg | ORAL_CAPSULE | Freq: Every day | ORAL | Status: DC
  Administered 2013-06-30 – 2013-07-04 (×5): 10 mg via ORAL
  Filled 2013-06-30 (×2): qty 1
  Filled 2013-06-30: qty 14
  Filled 2013-06-30 (×4): qty 1

## 2013-06-30 MED ORDER — TRAZODONE HCL 50 MG PO TABS
50.0000 mg | ORAL_TABLET | Freq: Every day | ORAL | Status: DC
  Administered 2013-06-30 – 2013-07-03 (×4): 50 mg via ORAL
  Filled 2013-06-30 (×6): qty 1
  Filled 2013-06-30: qty 14

## 2013-06-30 NOTE — [facility] Suicide Risk Assessment (Signed)
Suicide Risk Assessment  Admission Assessment     Nursing information obtained from:  Patient Demographic factors:  Male;Caucasian Current Mental Status:  Plan to harm others Loss Factors:  NA Historical Factors:  Prior suicide attempts;Family history of suicide;Family history of mental illness or substance abuse Risk Reduction Factors:  Sense of responsibility to family;Positive therapeutic relationship Total Time spent with patient: 45 minutes  CLINICAL FACTORS:   Depression:   Comorbid alcohol abuse/dependence Alcohol/Substance Abuse/Dependencies  Psychiatric Specialty Exam:     Blood pressure 127/90, pulse 80, temperature 98.1 F (36.7 C), temperature source Oral, resp. rate 18, height 5\' 10"  (1.778 m), weight 76.658 kg (169 lb), SpO2 98.00%.Body mass index is 24.25 kg/(m^2).  General Appearance: Fairly Groomed  Patent attorneyye Contact::  Fair  Speech:  Clear and Coherent  Volume:  Normal  Mood:  Anxious, Depressed and worried  Affect:  anxious, worried  Thought Process:  Coherent and Goal Directed  Orientation:  Full (Time, Place, and Person)  Thought Content:  evants, symptoms, worries, concerns  Suicidal Thoughts:  No  Homicidal Thoughts:  No  Memory:  Immediate;   Fair Recent;   Fair Remote;   Fair  Judgement:  Fair  Insight:  Present  Psychomotor Activity:  Restlessness  Concentration:  Fair  Recall:  FiservFair  Fund of Knowledge:NA  Language: Fair  Akathisia:  No  Handed:    AIMS (if indicated):     Assets:  Desire for Improvement  Sleep:  Number of Hours: 6   Musculoskeletal: Strength & Muscle Tone: within normal limits Gait & Station: normal Patient leans: N/A  COGNITIVE FEATURES THAT CONTRIBUTE TO RISK:  Closed-mindedness Polarized thinking Thought constriction (tunnel vision)    SUICIDE RISK:   Moderate:    PLAN OF CARE: Supportive approach/coping skills/relapse prevention                               Detox as needed                               Resume  previous medication regimes  I certify that inpatient services furnished can reasonably be expected to improve the patient's condition.  Rachael Feerving A Rishard Delange 06/30/2013, 7:00 PM

## 2013-06-30 NOTE — [facility] Counselor (Signed)
Adult Psychosocial Assessment Update Interdisciplinary Team  Previous [facility] Health Hospital admissions/discharges:  Admissions Discharges  Date:  02/16/13 Date:  02/23/13  Date: Date:  Date: Date:  Date: Date:  Date: Date:   Changes since the last Psychosocial Assessment (including adherence to outpatient mental health and/or substance abuse treatment, situational issues contributing to decompensation and/or relapse). Patient reports medications were changed by outpatient provider following discharge from the hospital.  He endorses increased depression and agitation.  He reports he was having HI towards father whom he states is a drug addict and towards the public in general.             Discharge Plan 1. Will you be returning to the same living situation after discharge?   Yes: No:      If no, what is your plan?    Yes.  Plan states he plans to return to his father's at discharge as it is the only place he has to live.       2. Would you like a referral for services when you are discharged? Yes:     If yes, for what services?  No:       No.  [facility]       Summary and Recommendations (to be completed by the evaluator) Thomas Atkins is a 38 years old male admitted with Major Depression Disorder.  He will benefit from crisis stabilization, evaluation for medication, psycho-education groups for coping skills development, group therapy and case management for discharge planning.                        Signature:  Joesph JulyQuylle Hairston Aidin Doane, 06/30/2013 10:05 AM

## 2013-06-30 NOTE — Clinical Social Work Note (Signed)
CSW spoke with patient's mother.  She advised has become increasingly irritable since medication change.  She stated she has no concerns that patient will harm himself or anyone else.

## 2013-06-30 NOTE — Progress Notes (Signed)
D:  Patient needs sleep medication, good appetite, low energy level, good attention span.  Rated depression 5, hopeless 1.  Denied SI.  Denied physical problems.  Worst pain in past 24 hours #2, zero pain goal.  Does have discharge plans.  No problems taking meds after discharge. A:  Medications administered per MD orders.  Emotional support and encouragement given patient. R:  Denied SI and HI.  Denied A/V hallucinations.  Denied pian.  Will continue to monitor patient for safety with 15 minute checks.  Safety maintained.

## 2013-06-30 NOTE — Progress Notes (Signed)
The focus of this group is to educate the patient on the purpose and policies of crisis stabilization and provide a format to answer questions about their admission.  The group details unit policies and expectations of patients while admitted.  Patient attended 0900 nurse education orientation group this morning.  Patient actively participated, appropriate affect, alert, appropriate insight and engagement.  Today patient will work on 3 goals for discharge.  

## 2013-06-30 NOTE — H&P (Signed)
Psychiatric Admission Assessment Adult  Patient Identification:  Thomas Atkins Date of Evaluation:  06/30/2013 Chief Complaint:  Major Depression, recurrent, moderate History of Present Illness:Thomas Atkins says he has had homicidal thoughts towards his father for years. The thoughts have gotten worse recently and he saw his psychiatrist and therapist and expressed wishes to hurt/kill even strangers on the street. Every body just seems "ignorant and disrespectful" and he has the urge to hurt them. He is afraid he is about to lose it. Meds were changed recently and they do no seem to work as well. His father is a heroin addict and that frustrates him and angers him. He hates feeling this way but cannot help himself, he says. He is depressed but says he is not suicidal.  Elements:  Location:  Adult unit. Quality:  chronic. Severity:  moderate. Timing:  worsening over the last 5 weeks. Duration:  years. Context:  patient is frustrated and agitated, homocidal, jobless. Associated Signs/Synptoms: Depression Symptoms:  depressed mood, psychomotor agitation, fatigue, difficulty concentrating, impaired memory, (Hypo) Manic Symptoms:  Impulsivity, Irritable Mood, Labiality of Mood, Anxiety Symptoms:  Panic Symptoms, Psychotic Symptoms:  Hallucinations: Auditory PTSD Symptoms: NA Total Time spent with patient: 45 minutes  Psychiatric Specialty Exam: Physical Exam  Psychiatric: His mood appears anxious. His affect is angry and labile. His speech is rapid and/or pressured. He is agitated and aggressive. Thought content is not paranoid and not delusional. Cognition and memory are impaired. He expresses impulsivity and inappropriate judgment. He expresses homicidal ideation. He expresses no suicidal ideation. He expresses no suicidal plans and no homicidal plans.  Patient is seen and the chart is reviewed. I agree with the findings of the exam completed in the ED with no exceptions at this time.     Review of Systems  Constitutional: Negative.   HENT: Negative.   Eyes: Negative.   Respiratory: Negative.   Cardiovascular: Negative.   Gastrointestinal: Negative.   Genitourinary: Negative.   Musculoskeletal: Negative.   Skin: Negative.   Neurological: Negative.  Negative for seizures.  Endo/Heme/Allergies: Negative.   Psychiatric/Behavioral: Positive for depression and substance abuse. Negative for suicidal ideas and hallucinations. The patient is nervous/anxious.     Blood pressure 119/76, pulse 117, temperature 98.1 F (36.7 C), temperature source Oral, resp. rate 18, height 5\' 10"  (1.778 m), weight 76.658 kg (169 lb), SpO2 98.00%.Body mass index is 24.25 kg/(m^2).  General Appearance: Casual  Eye Contact::  Good  Speech:  Clear and Coherent and Pressured  Volume:  Increased  Mood:  Angry and Irritable  Affect:  Congruent  Thought Process:  Circumstantial  Orientation:  Full (Time, Place, and Person)  Thought Content:    Suicidal Thoughts:  No  Homicidal Thoughts:  Yes.  without intent/plan  Memory:  NA  Judgement:  Poor  Insight:  Shallow  Psychomotor Activity:  Increased  Concentration:  Fair  Recall:  Good  Fund of Knowledge:Good  Language: Good  Akathisia:  No  Handed:  Right  AIMS (if indicated):     Assets:  Communication Skills Desire for Improvement Housing Physical Health Resilience Social Support  Sleep:  Number of Hours: 6    Musculoskeletal: Strength & Muscle Tone: within normal limits Gait & Station: normal Patient leans: N/A  Past Psychiatric History: Diagnosis:  Hospitalizations:  Outpatient Care: DR. O'Neil w/family services, Thomas Atkins therapist  Substance Abuse Care:  Self-Mutilation:  Suicidal Attempts:  Violent Behaviors:   Past Medical History:   Past Medical History  Diagnosis  Date  . Syncope   . Seizure disorder   . Alcoholism    None. Allergies:   Allergies  Allergen Reactions  . Ultram [Tramadol Hcl] Hives         PTA Medications: Prescriptions prior to admission  Medication Sig Dispense Refill  . diphenhydramine-acetaminophen (TYLENOL PM) 25-500 MG TABS Take 1 tablet by mouth at bedtime as needed.      . olanzapine zydis (ZYPREXA) 15 MG disintegrating tablet Take 15 mg by mouth at bedtime.        Previous Psychotropic Medications:  Medication/Dose                 Substance Abuse History in the last 12 months:  yes  Consequences of Substance Abuse: NA  Social History:  reports that he has been smoking Cigarettes.  He has been smoking about 0.50 packs per day. He does not have any smokeless tobacco history on file. He reports that he drinks alcohol. He reports that he does not use illicit drugs. Additional Social History: History of alcohol / drug use?: Yes Withdrawal Symptoms: Agitation Name of Substance 1: beer/alcohol 1 - Age of First Use: 38 yo 1 - Amount (size/oz): 40 oz 1 - Frequency: ocassionally 1 - Last Use / Amount: last night                  Current Place of Residence:   Place of Birth:   Family Members: Marital Status:  Divorced Children:  Sons:  Daughters: Relationships: Education:  GED Educational Problems/Performance: Religious Beliefs/Practices: History of Abuse (Emotional/Phsycial/Sexual) Occupational Experiences; Military History:  None. Legal History: Hobbies/Interests:  Family History:   Family History  Problem Relation Age of Onset  . Heart disease Paternal Grandmother     Results for orders placed during the hospital encounter of 06/29/13 (from the past 72 hour(s))  TSH     Status: None   Collection Time    06/29/13  1:37 PM      Result Value Ref Range   TSH 0.944  0.350 - 4.500 uIU/mL   Comment: Please note change in reference range.     [facility]   Psychological Evaluations:  Assessment:   DSM5:  Schizophrenia Disorders:   Obsessive-Compulsive Disorders:   Trauma-Stressor Disorders:    Substance/Addictive Disorders:  Alcohol Related Disorder - Moderate (303.90) Depressive Disorders:  Major Depressive Disorder - Severe (296.23)  AXIS I:   AXIS II:  Deferred AXIS III:   Past Medical History  Diagnosis Date  . Syncope   . Seizure disorder   . Alcoholism    AXIS IV:  occupational problems AXIS V:  51-60 moderate symptoms  Treatment Plan/Recommendations:   1. Admit for crisis management and stabilization. 2. Medication management to reduce current symptoms to base line and improve the patient's overall level of functioning. 3. Treat health problems as indicated. 4. Develop treatment plan to decrease risk of relapse upon discharge and to reduce the need for readmission. 5. Psycho-social education regarding relapse prevention and self care. 6. Health care follow up as needed for medical problems. 7. Restart home medications where appropriate.   Treatment Plan Summary: Daily contact with patient to assess and evaluate symptoms and progress in treatment Medication management Current Medications:  Current Facility-Administered Medications  Medication Dose Route Frequency Provider Last Rate Last Dose  . acetaminophen (TYLENOL) tablet 650 mg  650 mg Oral Q6H PRN Kerry HoughSpencer E Simon, PA-C      . alum & Jodelle Greenmag  hydroxide-simeth (MAALOX/MYLANTA) 200-200-20 MG/5ML suspension 30 mL  30 mL Oral Q4H PRN Kerry HoughSpencer E Simon, PA-C      . chlordiazePOXIDE (LIBRIUM) capsule 25 mg  25 mg Oral Q6H PRN Kerry HoughSpencer E Simon, PA-C      . chlordiazePOXIDE (LIBRIUM) capsule 25 mg  25 mg Oral QID Kerry HoughSpencer E Simon, PA-C   25 mg at 06/30/13 0836   Followed by  . [START ON 07/01/2013] chlordiazePOXIDE (LIBRIUM) capsule 25 mg  25 mg Oral TID Kerry HoughSpencer E Simon, PA-C       Followed by  . [START ON 07/02/2013] chlordiazePOXIDE (LIBRIUM) capsule 25 mg  25 mg Oral BH-qamhs Spencer E Simon, PA-C       Followed by  . [START ON 07/04/2013] chlordiazePOXIDE (LIBRIUM) capsule 25 mg  25 mg Oral Daily Kerry HoughSpencer E Simon, PA-C       . haloperidol (HALDOL) tablet 5 mg  5 mg Oral Q6H PRN Kerry HoughSpencer E Simon, PA-C   5 mg at 06/29/13 2141  . hydrOXYzine (ATARAX/VISTARIL) tablet 25 mg  25 mg Oral Q6H PRN Kerry HoughSpencer E Simon, PA-C      . loperamide (IMODIUM) capsule 2-4 mg  2-4 mg Oral PRN Kerry HoughSpencer E Simon, PA-C      . magnesium hydroxide (MILK OF MAGNESIA) suspension 30 mL  30 mL Oral Daily PRN Kerry HoughSpencer E Simon, PA-C      . multivitamin with minerals tablet 1 tablet  1 tablet Oral Daily Kerry HoughSpencer E Simon, PA-C   1 tablet at 06/30/13 [phone]  . OLANZapine zydis (ZYPREXA) disintegrating tablet 15 mg  15 mg Oral QHS Spencer E Simon, PA-C      . ondansetron (ZOFRAN-ODT) disintegrating tablet 4 mg  4 mg Oral Q6H PRN Kerry HoughSpencer E Simon, PA-C      . thiamine (VITAMIN B-1) tablet 100 mg  100 mg Oral Daily Kerry HoughSpencer E Simon, PA-C   100 mg at 06/30/13 [phone]  . traZODone (DESYREL) tablet 50 mg  50 mg Oral QHS,Thomas X 1 Kerry HoughSpencer E Simon, PA-C   50 mg at 06/29/13 2142    Observation Level/Precautions:  routine  Laboratory:  reviewed  Psychotherapy:    groups  Medications:  Prozac, Vistaril, Trazadone  Consultations:  As needed  Discharge Concerns:  Relapse prevention  Estimated LOS:   3-5 days  Other:     I certify that inpatient services furnished can reasonably be expected to improve the patient's condition.   Rona RavensNeil T. Mashburn RPAC 11:04 AM 06/30/2013 Personally evaluated the patient reviewed the physical exam and labs and agree with assessment and plan Madie RenoIrving A. UnionvilleLugo M.D.

## 2013-06-30 NOTE — Progress Notes (Signed)
D: Patient in his room in bed on approach.  Patient states he has been up an about to day but states he has still been increasingly anxious.  Patient states he was given medication for anxiety and he states it has helped some.  Patient states his goal is to get back on all of his medications and when they start working he hopes to be discharged.  Patient states on admission he was having HI towards his father and other but currently denies and states, "that is why I came here."  Patient denies SI/HI and denies AVH. A: Staff to monitor Q 15 mins for safety.  Encouragement and support offered.  Scheduled medications administered per orders. R: Patient remains safe on the unit.  Patient attended group tonight.  Patient taking administered medications.

## 2013-06-30 NOTE — [facility] Suicide Risk Assessment (Signed)
[facility] INPATIENT:  Family/Significant Other Suicide Prevention Education  Suicide Prevention Education:  Education Completed; Thomas BimlerJanet Atkins, Mother [phone];) has been identified by the patient as the family member/significant other with whom the patient will be residing, and identified as the person(s) who will aid the patient in the event of a mental health crisis (suicidal ideations/suicide attempt).  With written consent from the patient, the family member/significant other has been provided the following suicide prevention education, prior to the and/or following the discharge of the patient.  The suicide prevention education provided includes the following:  Suicide risk factors  Suicide prevention and interventions  National Suicide Hotline telephone number  [facility] assessment telephone number  [facility] Emergency Assistance 911  [facility] and/or [facility]idential Mobile Crisis Unit telephone number  Request made of family/significant other to:  Remove weapons (e.g., guns, rifles, knives), all items previously/currently identified as safety concern.  Mother advised patient does not have access to guns.  Remove drugs/medications (over-the-counter, prescriptions, illicit drugs), all items previously/currently identified as a safety concern.  The family member/significant other verbalizes understanding of the suicide prevention education information provided.  The family member/significant other agrees to remove the items of safety concern listed above.  Thomas Atkins Thomas Atkins Thomas Atkins 06/30/2013, 2:56 PM

## 2013-06-30 NOTE — [facility] Group Notes (Signed)
[facility] LCSW Group Therapy  06/30/2013   1:15 PM   Type of Therapy:  Group Therapy  Participation Level:  Active  Participation Quality:  Attentive, Sharing and Supportive  Affect:  Depressed and Flat  Cognitive:  Alert and Oriented  Insight:  Developing/Improving and Engaged  Engagement in Therapy:  Developing/Improving and Engaged  Modes of Intervention:  Activity, Clarification, Confrontation, Discussion, Education, Exploration, Limit-setting, Orientation, Problem-solving, Rapport Building, Reality Testing, Socialization and Support  Summary of Progress/Problems: Patient was attentive and engaged with speaker from [employer].  Patient was attentive to speaker while they shared their story of dealing with mental health and overcoming it.  Patient expressed interest in their programs and services and received information on their agency.  Patient processed ways they can relate to the speaker.     Regan Mcbryar Horton, LCSW 06/30/2013  2:30 PM   

## 2013-06-30 NOTE — Progress Notes (Signed)
Adult Psychoeducational Group Note  Date:  06/30/2013 Time:  10:00am Group Topic/Focus:  Personal Choices and Values:   The focus of this group is to help patients assess and explore the importance of values in their lives, how their values affect their decisions, how they express their values and what opposes their expression.  Participation Level:  Active  Participation Quality:  Appropriate and Attentive  Affect:  Appropriate  Cognitive:  Alert and Appropriate  Insight: Appropriate  Engagement in Group:  Engaged  Modes of Intervention:  Discussion and Education  Additional Comments:  Pt attended and participated in group. Question was asked what is your biggest stressor? Pt stated his biggest stressor is friends.  Pryor Curiaanya D Garner 06/30/2013, 11:42 AM

## 2013-07-01 DIAGNOSIS — F3160 Bipolar disorder, current episode mixed, unspecified: Secondary | ICD-10-CM

## 2013-07-01 MED ORDER — ARIPIPRAZOLE 5 MG PO TABS: 5.0000 mg | ORAL_TABLET | Freq: Once | ORAL | Status: DC

## 2013-07-01 MED ORDER — ARIPIPRAZOLE 5 MG PO TABS
5.0000 mg | ORAL_TABLET | Freq: Once | ORAL | Status: DC
  Filled 2013-07-01: qty 1

## 2013-07-01 NOTE — Tx Team (Signed)
Interdisciplinary Treatment Plan Update   Date Reviewed:  07/01/2013  Time Reviewed:  8:40 AM  Progress in Treatment:   Attending groups: Yes Participating in groups: Yes Taking medication as prescribed: Yes  Tolerating medication: Yes Family/Significant other contact made: Yes, collateral contact with mother. Patient understands diagnosis: Yes  Discussing patient identified problems/goals with staff: Yes Medical problems stabilized or resolved: Yes Denies suicidal/homicidal ideation: Yes Patient has not harmed self or others: Yes  For review of initial/current patient goals, please see plan of care.  Estimated Length of Stay:  3-4 days  Reasons for Continued Hospitalization:  Anxiety Depression Medication stabilization  New Problems/Goals identified:    Discharge Plan or Barriers:   Home with outpatient follow up with [facility]  Additional Comments:   Mr Thomas Atkins says he has had homicidal thoughts towards his father for years. The thoughts have gotten worse recently and he saw his psychiatrist and therapist and expressed wishes to hurt/kill even strangers on the street. Every body just seems "ignorant and disrespectful" and he has the urge to hurt them. He is afraid he is about to lose it. Meds were changed recently and they do no seem to work as well. His father is a heroin addict and that frustrates him and angers him. He hates feeling this way but cannot help himself, he says. He is depressed but says he is not suicidal.   Attendees:  Patient:   07/01/2013 8:40 AM   Signature: Geoffery LyonsIrving Lugo, MD 07/01/2013 8:40 AM  Signature:  Verne SpurrNeil Mashburn, PA 07/01/2013 8:40 AM  Signature:   07/01/2013 8:40 AM  Signature:Beverly Terrilee CroakKnight, RN 07/01/2013 8:40 AM  Signature:   07/01/2013 8:40 AM  Signature:  Juline PatchQuylle Nasif Bos, LCSW 07/01/2013 8:40 AM  Signature:  Reyes Ivanhelsea Horton, LCSW 07/01/2013 8:40 AM  Signature:  Leisa LenzValerie Enoch, Care Coordinator [facility] 07/01/2013 8:40 AM  Signature:   07/01/2013 8:40  AM  Signature:  07/01/2013  8:40 AM  Signature:   Onnie BoerJennifer Clark, RN [facility] 07/01/2013  8:40 AM  Signature:  Harold Barbanonecia Byrd, RN 07/01/2013  8:40 AM    Scribe for Treatment Team:   Juline PatchQuylle Fines Kimberlin,  07/01/2013 8:40 AM

## 2013-07-01 NOTE — Progress Notes (Signed)
Adult Psychoeducational Group Note  Date:  07/01/2013 Time:  9:36 PM  Group Topic/Focus:  Wrap-Up Group:   The focus of this group is to help patients review their daily goal of treatment and discuss progress on daily workbooks.  Participation Level:  Active  Participation Quality:  Appropriate, Attentive, Sharing and Supportive  Affect:  Appropriate  Cognitive:  Alert and Appropriate  Insight: Appropriate and Good  Engagement in Group:  Engaged and Supportive  Modes of Intervention:  Education, Socialization and Support  Additional Comments:  Pt attended and participated in group.  Thomas Atkins 07/01/2013, 9:36 PM

## 2013-07-01 NOTE — Progress Notes (Signed)
Patient ID: Thomas Atkins, male   DOB: [birth year], 38 y.o.   MRN: [id number] D: pt. Visible on unit, reports mom and step-father here for dinner today, visit was favorable. Pt. Plan for discharge on Monday" I talked to [facility] Hugereil, they going to give me a one time dose of Abilify on Saturday then going then 30 day shots, they already got me set up with [facility]" A: Writer provided emotional support encouraged client to move forward with mental wellness by following through with appointments and medications.Encouraged group. Staff will monitor q8015min for safety. R: Pt. Is safe on the unit, attended group.

## 2013-07-01 NOTE — [facility] Group Notes (Signed)
[facility] LCSW Group Therapy  Feelings Around Relapse 1:15 -2:30        07/01/2013 2:57 PM   Type of Therapy:  Group Therapy  Participation Level: Did not attend group.   Thomas Atkins, Thomas Atkins 07/01/2013 2:57 PM

## 2013-07-01 NOTE — [facility] Group Notes (Signed)
[facility]   07/01/2013 9:52 AM    Participation Quality:  Appropraite  Mood/Affect:  Appropriate  Depression Rating:  1  Anxiety Rating:  1  Thoughts of Suicide:  No  Will you contract for safety?   NA  Current AVH:  No  Plan for Discharge/Comments:  Patient attended discharge planning group and actively participated in group. [facility]  CSW provided all participants with daily workbook.   Transportation Means: Patient has transportation.   Supports:  Patient has a support system.   Josedaniel Haye, Joesph JulyQuylle Hairston

## 2013-07-01 NOTE — Progress Notes (Signed)
[facility]  07/01/2013 3:43 PM Thomas Atkins  MRN:  [id number] Subjective:  Patient is seen for follow up. He says he is great today. Reports he slept great! Says his depression is good, and he's ready to roll. States [facility] and he is pleased about this.  He says he doesn't want the Librium, and the vistaril works better. His mother and step father are coming to dinner. He denies any withdrawal symptoms. Diagnosis:   DSM5: Schizophrenia Disorders:   Obsessive-Compulsive Disorders:   Trauma-Stressor Disorders:   Substance/Addictive Disorders:  Alcohol Related Disorder - Moderate (303.90) Depressive Disorders:  Major Depressive Disorder - Severe (296.23) Total Time spent with patient: 20 minutes  Axis I: Bipolar, mixed  ADL's:  Intact  Sleep: Good  Appetite:  Good  Suicidal Ideation:  denies Homicidal Ideation:  Denies   AEB (as evidenced by):  Psychiatric Specialty Exam: Physical Exam  ROS  Blood pressure 110/77, pulse 111, temperature 97.5 F (36.4 C), temperature source Oral, resp. rate 16, height 5\' 10"  (1.778 m), weight 76.658 kg (169 lb), SpO2 98.00%.Body mass index is 24.25 kg/(m^2).  General Appearance: Fairly Groomed  Patent attorneyye Contact::  Good  Speech:  Clear and Coherent  Volume:  Normal  Mood:  Euthymic  Affect:  Congruent  Thought Process:  Goal Directed  Orientation:  Full (Time, Place, and Person)  Thought Content:  WDL  Suicidal Thoughts:  No  Homicidal Thoughts:  No  Memory:  NA  Judgement:  Fair  Insight:  Present  Psychomotor Activity:  Normal  Concentration:  Fair  Recall:  Fair  Fund of Knowledge:Fair  Language: Good  Akathisia:  No  Handed:  Right  AIMS (if indicated):     Assets:  Communication Skills Desire for Improvement Physical Health  Sleep:  Number of Hours: 6.75   Musculoskeletal: Strength & Muscle Tone: within normal limits Gait & Station: normal Patient leans: N/A  Current  Medications: Current Facility-Administered Medications  Medication Dose Route Frequency Provider Last Rate Last Dose  . acetaminophen (TYLENOL) tablet 650 mg  650 mg Oral Q6H PRN Kerry HoughSpencer E Simon, PA-C      . alum & mag hydroxide-simeth (MAALOX/MYLANTA) 200-200-20 MG/5ML suspension 30 mL  30 mL Oral Q4H PRN Kerry HoughSpencer E Simon, PA-C      . chlordiazePOXIDE (LIBRIUM) capsule 25 mg  25 mg Oral Q6H PRN Kerry HoughSpencer E Simon, PA-C      . chlordiazePOXIDE (LIBRIUM) capsule 25 mg  25 mg Oral TID Kerry HoughSpencer E Simon, PA-C   25 mg at 07/01/13 1308   Followed by  . [START ON 07/02/2013] chlordiazePOXIDE (LIBRIUM) capsule 25 mg  25 mg Oral BH-qamhs Spencer E Simon, PA-C       Followed by  . [START ON 07/04/2013] chlordiazePOXIDE (LIBRIUM) capsule 25 mg  25 mg Oral Daily Kerry HoughSpencer E Simon, PA-C      . FLUoxetine (PROZAC) capsule 10 mg  10 mg Oral Daily Verne SpurrNeil Mashburn, PA-C   10 mg at 07/01/13 0800  . hydrOXYzine (ATARAX/VISTARIL) tablet 25 mg  25 mg Oral QID PRN Verne SpurrNeil Mashburn, PA-C   25 mg at 07/01/13 0802  . loperamide (IMODIUM) capsule 2-4 mg  2-4 mg Oral PRN Kerry HoughSpencer E Simon, PA-C      . magnesium hydroxide (MILK OF MAGNESIA) suspension 30 mL  30 mL Oral Daily PRN Kerry HoughSpencer E Simon, PA-C      . multivitamin with minerals tablet 1 tablet  1 tablet  Oral Daily Kerry HoughSpencer E Simon, PA-C   1 tablet at 07/01/13 0759  . nicotine (NICODERM CQ - dosed in mg/24 hours) patch 21 mg  21 mg Transdermal Daily Nehemiah SettleJanardhaha R Jonnalagadda, MD   21 mg at 07/01/13 0756  . ondansetron (ZOFRAN-ODT) disintegrating tablet 4 mg  4 mg Oral Q6H PRN Kerry HoughSpencer E Simon, PA-C      . thiamine (VITAMIN B-1) tablet 100 mg  100 mg Oral Daily Kerry HoughSpencer E Simon, PA-C   100 mg at 07/01/13 0759  . traZODone (DESYREL) tablet 50 mg  50 mg Oral QHS Verne SpurrNeil Mashburn, PA-C   50 mg at 06/30/13 2213    Lab Results: No results found for this or any previous visit (from the past 48 hour(s)).  Physical Findings: AIMS: Facial and Oral Movements Muscles of Facial Expression: None,  normal Lips and Perioral Area: None, normal Jaw: None, normal Tongue: None, normal,Extremity Movements Upper (arms, wrists, hands, fingers): None, normal Lower (legs, knees, ankles, toes): None, normal, Trunk Movements Neck, shoulders, hips: None, normal, Overall Severity Severity of abnormal movements (highest score from questions above): None, normal Incapacitation due to abnormal movements: None, normal Patient's awareness of abnormal movements (rate only patient's report): No Awareness, Dental Status Current problems with teeth and/or dentures?: No Does patient usually wear dentures?: No  CIWA:  CIWA-Ar Total: 1 COWS:  COWS Total Score: 2  Treatment Plan Summary: Daily contact with patient to assess and evaluate symptoms and progress in treatment Medication management  Plan: 1. Continue for crisis management and stabilization. 2. Medication management to reduce current symptoms to base line and improve the patient's overall level of functioning. 3. Treat health problems as indicated. 4. Develop treatment plan to decrease risk of relapse upon discharge and to reduce the need for readmission. 5. Psycho-social education regarding relapse prevention and self care. 6. Health care follow up as needed for medical problems. 7. Patient informed that he can decline the Librium if he feels he does not need it. 8. Disposition in progress. 9. After discussion with the patient and Dr. Dub MikesLugo will start Abilify 5mg  po X 1 on Sunday.  Medical Decision Making Problem Points:  Established problem, stable/improving (1) Data Points:  Review or order medicine tests (1)  I certify that inpatient services furnished can reasonably be expected to improve the patient's condition.  Rona RavensNeil T. Mashburn RPAC 3:48 PM 07/01/2013 Discussed the patient agree with assessment and plan Madie RenoIrving A. Dub MikesLugo, M.D.

## 2013-07-01 NOTE — Progress Notes (Signed)
D:  Patient's self inventory sheet, patient sleeps well, good appetite, normal energy level, good attention span. Rated depression and hopeless 1.  Denied anxiety.  Denied withdrawals.  Denied SI.  Worst pain in past 24 hours #1.  Will take his meds as prescribed.  Thanks for all your understanding and help.  Does have discharge plans.  No problems with meds after discharge. A:  Medications administered per MD orders. R:  Denied SI and HI.  Denied A/V hallucinations.  Will continue to monitor pt for safety with 15 minute checks.  Safety maintained.

## 2013-07-02 DIAGNOSIS — F1010 Alcohol abuse, uncomplicated: Secondary | ICD-10-CM

## 2013-07-02 DIAGNOSIS — F332 Major depressive disorder, recurrent severe without psychotic features: Principal | ICD-10-CM

## 2013-07-02 DIAGNOSIS — F1994 Other psychoactive substance use, unspecified with psychoactive substance-induced mood disorder: Secondary | ICD-10-CM

## 2013-07-02 DIAGNOSIS — F3110 Bipolar disorder, current episode manic without psychotic features, unspecified: Secondary | ICD-10-CM

## 2013-07-02 MED ORDER — ARIPIPRAZOLE 5 MG PO TABS
5.0000 mg | ORAL_TABLET | Freq: Once | ORAL | Status: AC
  Administered 2013-07-02: 5 mg via ORAL

## 2013-07-02 NOTE — Progress Notes (Signed)
Patient ID: Thomas Atkins, male   DOB: 1975-11-30, 38 y.o.   MRN: [id number] [facility]  07/02/2013 11:35 AM Thomas BlueMark P Kannan  MRN:  [id number] Subjective: Pt seen and chart reviewed. Pt is rating anxiety and depression as minimal and presentation is congruent with affect as well as subjective reporting. Pt denies SI, HI, and AVH, contracts for safety. Pt reports participating in group therapy and perceived benefit from such, reports agreement with medication and treatment plan, and denies observable side effects from such treatment. Pt reports that he "is ready to go home" and is hoping to discharge on Monday.   HPI: Mr Thomas FellowsOrona says he has had homicidal thoughts towards his father for years. The thoughts have gotten worse recently and he saw his psychiatrist and therapist and expressed wishes to hurt/kill even strangers on the street. Every body just seems "ignorant and disrespectful" and he has the urge to hurt them. He is afraid he is about to lose it. Meds were changed recently and they do no seem to work as well. His father is a heroin addict and that frustrates him and angers him. He hates feeling this way but cannot help himself, he says. He is depressed but says he is not suicidal.   Diagnosis:   DSM5: Schizophrenia Disorders:   Obsessive-Compulsive Disorders:   Trauma-Stressor Disorders:   Substance/Addictive Disorders:  Alcohol Related Disorder - Moderate (303.90) Depressive Disorders:  Major Depressive Disorder - Severe (296.23) Total Time spent with patient: 25 minutes  Axis I: Alcohol Abuse, Bipolar, Manic, Major Depression, Recurrent severe and Substance Induced Mood Disorder Axis II: Deferred Axis III:  Past Medical History  Diagnosis Date  . Syncope   . Seizure disorder   . Alcoholism    Axis IV: other psychosocial or environmental problems and problems related to social environment Axis V: 51-60 moderate symptoms  ADL's:  Intact  Sleep: Good  Appetite:   Good  Suicidal Ideation:  denies Homicidal Ideation:  Denies   AEB (as evidenced by):  Psychiatric Specialty Exam: Physical Exam  Review of Systems  Constitutional: Negative.   HENT: Negative.   Eyes: Negative.   Respiratory: Negative.   Cardiovascular: Negative.   Gastrointestinal: Negative.   Genitourinary: Negative.   Musculoskeletal: Negative.   Skin: Negative.   Neurological: Negative.   Endo/Heme/Allergies: Negative.   Psychiatric/Behavioral: Positive for depression. The patient is nervous/anxious.     Blood pressure 101/71, pulse 106, temperature 97.3 F (36.3 C), temperature source Oral, resp. rate 18, height 5\' 10"  (1.778 m), weight 76.658 kg (169 lb), SpO2 98.00%.Body mass index is 24.25 kg/(m^2).  General Appearance: Fairly Groomed  Patent attorneyye Contact::  Good  Speech:  Clear and Coherent  Volume:  Normal  Mood:  Euthymic  Affect:  Congruent  Thought Process:  Goal Directed  Orientation:  Full (Time, Place, and Person)  Thought Content:  WDL  Suicidal Thoughts:  No  Homicidal Thoughts:  No  Memory:  NA  Judgement:  Fair  Insight:  Present  Psychomotor Activity:  Normal  Concentration:  Fair  Recall:  Fair  Fund of Knowledge:Fair  Language: Good  Akathisia:  No  Handed:  Right  AIMS (if indicated):     Assets:  Communication Skills Desire for Improvement Physical Health  Sleep:  Number of Hours: 6.25   Musculoskeletal: Strength & Muscle Tone: within normal limits Gait & Station: normal Patient leans: N/A  Current Medications: Current Facility-Administered Medications  Medication Dose Route Frequency Provider Last Rate Last  Dose  . acetaminophen (TYLENOL) tablet 650 mg  650 mg Oral Q6H PRN Kerry Hough, PA-C      . alum & mag hydroxide-simeth (MAALOX/MYLANTA) 200-200-20 MG/5ML suspension 30 mL  30 mL Oral Q4H PRN Kerry Hough, PA-C      . chlordiazePOXIDE (LIBRIUM) capsule 25 mg  25 mg Oral Q6H PRN Kerry Hough, PA-C      . chlordiazePOXIDE  (LIBRIUM) capsule 25 mg  25 mg Oral BH-qamhs Spencer E Simon, PA-C       Followed by  . [START ON 07/04/2013] chlordiazePOXIDE (LIBRIUM) capsule 25 mg  25 mg Oral Daily Kerry Hough, PA-C      . FLUoxetine (PROZAC) capsule 10 mg  10 mg Oral Daily Verne Spurr, PA-C   10 mg at 07/02/13 9604  . hydrOXYzine (ATARAX/VISTARIL) tablet 25 mg  25 mg Oral QID PRN Verne Spurr, PA-C   25 mg at 07/02/13 5409  . loperamide (IMODIUM) capsule 2-4 mg  2-4 mg Oral PRN Kerry Hough, PA-C      . magnesium hydroxide (MILK OF MAGNESIA) suspension 30 mL  30 mL Oral Daily PRN Kerry Hough, PA-C      . multivitamin with minerals tablet 1 tablet  1 tablet Oral Daily Kerry Hough, PA-C   1 tablet at 07/02/13 0827  . nicotine (NICODERM CQ - dosed in mg/24 hours) patch 21 mg  21 mg Transdermal Daily Nehemiah Settle, MD   21 mg at 07/02/13 0826  . ondansetron (ZOFRAN-ODT) disintegrating tablet 4 mg  4 mg Oral Q6H PRN Kerry Hough, PA-C      . thiamine (VITAMIN B-1) tablet 100 mg  100 mg Oral Daily Kerry Hough, PA-C   100 mg at 07/02/13 0827  . traZODone (DESYREL) tablet 50 mg  50 mg Oral QHS Verne Spurr, PA-C   50 mg at 07/01/13 2139    Lab Results: No results found for this or any previous visit (from the past 48 hour(s)).  Physical Findings: AIMS: Facial and Oral Movements Muscles of Facial Expression: None, normal Lips and Perioral Area: None, normal Jaw: None, normal Tongue: None, normal,Extremity Movements Upper (arms, wrists, hands, fingers): None, normal Lower (legs, knees, ankles, toes): None, normal, Trunk Movements Neck, shoulders, hips: None, normal, Overall Severity Severity of abnormal movements (highest score from questions above): None, normal Incapacitation due to abnormal movements: None, normal Patient's awareness of abnormal movements (rate only patient's report): No Awareness, Dental Status Current problems with teeth and/or dentures?: No Does patient usually wear  dentures?: No  CIWA:  CIWA-Ar Total: 1 COWS:  COWS Total Score: 0  Treatment Plan Summary: Daily contact with patient to assess and evaluate symptoms and progress in treatment Medication management  Plan: 1. Continue for crisis management and stabilization. 2. Medication management to reduce current symptoms to base line and improve the patient's overall level of functioning. 3. Treat health problems as indicated. 4. Develop treatment plan to decrease risk of relapse upon discharge and to reduce the need for readmission. 5. Psycho-social education regarding relapse prevention and self care. 6. Health care follow up as needed for medical problems. 7. Patient informed that he can decline the Librium if he feels he does not need it. 8. Disposition in progress. 9. After discussion with the patient and Dr. Dub Mikes will start Abilify 5mg  po X 1 on Saturday.   Medical Decision Making Problem Points:  Established problem, stable/improving (1), Review of last therapy session (1) and  Review of psycho-social stressors (1) Data Points:  Review or order clinical lab tests (1) Review or order medicine tests (1) Review of medication regiment & side effects (2) Review of new medications or change in dosage (2)  I certify that inpatient services furnished can reasonably be expected to improve the patient's condition.   Beau FannyJohn C Withrow, FNP-BC  11:35 AM 07/02/2013 I agreed with findings and treatment plan of this patient

## 2013-07-02 NOTE — Progress Notes (Signed)
[facility] Group Notes:  (Nursing/MHT/Case Management/Adjunct)  Date:  07/02/2013  Time:  11:35 PM  Type of Therapy:  Group Therapy  Participation Level:  Minimal  Participation Quality:  Appropriate  Affect:  Appropriate  Cognitive:  Appropriate  Insight:  Appropriate  Engagement in Group:  Developing/Improving  Modes of Intervention:  Limit-setting, Socialization and Support  Summary of Progress/Problems: Pt. Attended group and had minimal interaction in group discussion.  Pt. Had no complaints or signs of stress.  Sondra ComeRyan J Ashira Kirsten 07/02/2013, 11:35 PM

## 2013-07-02 NOTE — Progress Notes (Signed)
D.  Pt. Denies SI/HI and denies A/V hallucinations.  Rates Hopelessness and depression as a 1.  Denies anxiety. Pt. Attended group. A.  Encouragement and support given. R.  Pt. Receptive and remains safe.

## 2013-07-02 NOTE — Progress Notes (Signed)
D.  Pt. Reports feeling woozy and anxious and  did not know if these feeling were coming from his Abilify.   Pt.'s vitals were taken O2 sat was 97% B/P 135/88 P-65  R-18 sitting   Standing B/P 122/86  P-97  T-98 A.  Pt. Given Vistaril 25 mg PO for anxiety. R.  Pt.'s anxiety decreased.

## 2013-07-02 NOTE — Progress Notes (Signed)
Patient ID: Thomas Atkins P Anschutz, male   DOB: 1975-09-06, 38 y.o.   MRN: [id number]  Patient in assigned bed sleeping. Respirations are even and unlabored no S/S of distress. Q15 minute safety checks are maintained for safety.

## 2013-07-02 NOTE — [facility] Group Notes (Signed)
[facility] LCSW Group Therapy Note  07/02/2013 / 1:15 PM  Type of Therapy and Topic:  Group Therapy: Avoiding Self-Sabotaging and Enabling Behaviors  Participation Level:  Minimal   Mood: irritable  Description of Group:     Learn how to identify obstacles, self-sabotaging and enabling behaviors, what are they, why do we do them and what needs do these behaviors meet? Discuss unhealthy relationships and how to have positive healthy boundaries with those that sabotage and enable. Explore aspects of self-sabotage and enabling in yourself and how to limit these self-destructive behaviors in everyday life.  Therapeutic Goals: 1. Patient will identify one obstacle that relates to self-sabotage and enabling behaviors 2. Patient will identify one personal self-sabotaging or enabling behavior they did prior to admission 3. Patient able to establish a plan to change the above identified behavior they did prior to admission:  4. Patient will demonstrate ability to communicate their needs through discussion and/or role plays.   Summary of Patient Progress: The main focus of today's process group was to explain to the adolescent what "self-sabotage" means and use Motivational Interviewing to discuss what benefits, negative or positive, were involved in a self-identified self-sabotaging behavior. We then talked about stages of change flow chart and each patient identified where they were along with their self sabotaging behavior if any. Thomas Atkins shared during warm up that he is looking forward to getting back on medications and [place]. Patient left group not to return early in session.    Therapeutic Modalities:   Cognitive Behavioral Therapy Person-Centered Therapy Motivational Interviewing   Carney Bernatherine C Harrill, LCSW

## 2013-07-02 NOTE — Progress Notes (Signed)
Adult Psychoeducational Group Note  Date:  07/02/2013 Time:  0900  Group Topic/Focus:  Coping With Mental Health Crisis:   The purpose of this group is to help patients identify strategies for coping with mental health crisis.  Group discusses possible causes of crisis and ways to manage them effectively.  Participation Level:  Did Not Attend despite staff encouragement   Eilleen Davoli Ivan Telvin Reinders 07/02/2013, 12:51 PM 

## 2013-07-03 MED ORDER — ARIPIPRAZOLE 5 MG PO TABS
5.0000 mg | ORAL_TABLET | Freq: Once | ORAL | Status: AC
  Administered 2013-07-03: 5 mg via ORAL
  Filled 2013-07-03 (×2): qty 1

## 2013-07-03 NOTE — Progress Notes (Signed)
Patient ID: Thomas Atkins, male   DOB: [birth year], 38 y.o.   MRN: [id number] [facility] MD Progress Note  07/03/2013 7:21 PM Thomas Atkins  MRN:  [id number] Subjective: Pt seen and chart reviewed. Pt is rating anxiety and depression as minimal and presentation is congruent with affect as well as subjective reporting. Pt denies SI, HI, and AVH, contracts for safety. Pt is observed cleaning his room and making his bed. Pt is in a very positive mood today, stating that he has benefited much from [facility]; pt anticipates discharge tomorrow on Monday.   HPI: Mr Thomas Atkins says he has had homicidal thoughts towards his father for years. The thoughts have gotten worse recently and he saw his psychiatrist and therapist and expressed wishes to hurt/kill even strangers on the street. Every body just seems "ignorant and disrespectful" and he has the urge to hurt them. He is afraid he is about to lose it. Meds were changed recently and they do no seem to work as well. His father is a heroin addict and that frustrates him and angers him. He hates feeling this way but cannot help himself, he says. He is depressed but says he is not suicidal.   Diagnosis:   DSM5: Schizophrenia Disorders:   Obsessive-Compulsive Disorders:   Trauma-Stressor Disorders:   Substance/Addictive Disorders:  Alcohol Related Disorder - Moderate (303.90) Depressive Disorders:  Major Depressive Disorder - Severe (296.23) Total Time spent with patient: 25 minutes  Axis I: Alcohol Abuse, Bipolar, Manic, Major Depression, Recurrent severe and Substance Induced Mood Disorder Axis II: Deferred Axis III:  Past Medical History  Diagnosis Date  . Syncope   . Seizure disorder   . Alcoholism    Axis IV: other psychosocial or environmental problems and problems related to social environment Axis V: 51-60 moderate symptoms  ADL's:  Intact  Sleep: Good  Appetite:  Good  Suicidal Ideation:  denies Homicidal Ideation:  Denies   AEB  (as evidenced by):  Psychiatric Specialty Exam: Physical Exam  Review of Systems  Constitutional: Negative.   HENT: Negative.   Eyes: Negative.   Respiratory: Negative.   Cardiovascular: Negative.   Gastrointestinal: Negative.   Genitourinary: Negative.   Musculoskeletal: Negative.   Skin: Negative.   Neurological: Negative.   Endo/Heme/Allergies: Negative.   Psychiatric/Behavioral: Positive for depression. The patient is nervous/anxious.     Blood pressure 143/106, pulse 116, temperature 97.7 F (36.5 C), temperature source Oral, resp. rate 16, height 5\' 10"  (1.778 m), weight 76.658 kg (169 lb), SpO2 97.00%.Body mass index is 24.25 kg/(m^2).  General Appearance: Fairly Groomed  Patent attorneyye Contact::  Good  Speech:  Clear and Coherent  Volume:  Normal  Mood:  Euthymic  Affect:  Congruent  Thought Process:  Goal Directed  Orientation:  Full (Time, Place, and Person)  Thought Content:  WDL  Suicidal Thoughts:  No  Homicidal Thoughts:  No  Memory:  NA  Judgement:  Fair  Insight:  Present  Psychomotor Activity:  Normal  Concentration:  Fair  Recall:  Fair  Fund of Knowledge:Fair  Language: Good  Akathisia:  No  Handed:  Right  AIMS (if indicated):     Assets:  Communication Skills Desire for Improvement Physical Health  Sleep:  Number of Hours: 6.25   Musculoskeletal: Strength & Muscle Tone: within normal limits Gait & Station: normal Patient leans: N/A  Current Medications: Current Facility-Administered Medications  Medication Dose Route Frequency Provider Last Rate Last Dose  . acetaminophen (TYLENOL) tablet 650  mg  650 mg Oral Q6H PRN Kerry Hough, PA-C      . alum & mag hydroxide-simeth (MAALOX/MYLANTA) 200-200-20 MG/5ML suspension 30 mL  30 mL Oral Q4H PRN Kerry Hough, PA-C      . chlordiazePOXIDE (LIBRIUM) capsule 25 mg  25 mg Oral BH-qamhs Kerry Hough, PA-C       Followed by  . [START ON 07/04/2013] chlordiazePOXIDE (LIBRIUM) capsule 25 mg  25 mg Oral  Daily Kerry Hough, PA-C      . FLUoxetine (PROZAC) capsule 10 mg  10 mg Oral Daily Verne Spurr, PA-C   10 mg at 07/03/13 0755  . hydrOXYzine (ATARAX/VISTARIL) tablet 25 mg  25 mg Oral QID PRN Verne Spurr, PA-C   25 mg at 07/03/13 1154  . magnesium hydroxide (MILK OF MAGNESIA) suspension 30 mL  30 mL Oral Daily PRN Kerry Hough, PA-C      . multivitamin with minerals tablet 1 tablet  1 tablet Oral Daily Kerry Hough, PA-C   1 tablet at 07/03/13 0754  . nicotine (NICODERM CQ - dosed in mg/24 hours) patch 21 mg  21 mg Transdermal Daily Nehemiah Settle, MD   21 mg at 07/03/13 0752  . thiamine (VITAMIN B-1) tablet 100 mg  100 mg Oral Daily Kerry Hough, PA-C   100 mg at 07/03/13 0754  . traZODone (DESYREL) tablet 50 mg  50 mg Oral QHS Verne Spurr, PA-C   50 mg at 07/02/13 2120    Lab Results: No results found for this or any previous visit (from the past 48 hour(s)).  Physical Findings: AIMS: Facial and Oral Movements Muscles of Facial Expression: None, normal Lips and Perioral Area: None, normal Jaw: None, normal Tongue: None, normal,Extremity Movements Upper (arms, wrists, hands, fingers): None, normal Lower (legs, knees, ankles, toes): None, normal, Trunk Movements Neck, shoulders, hips: None, normal, Overall Severity Severity of abnormal movements (highest score from questions above): None, normal Incapacitation due to abnormal movements: None, normal Patient's awareness of abnormal movements (rate only patient's report): No Awareness, Dental Status Current problems with teeth and/or dentures?: No Does patient usually wear dentures?: No  CIWA:  CIWA-Ar Total: 0 COWS:  COWS Total Score: 0  Treatment Plan Summary: Daily contact with patient to assess and evaluate symptoms and progress in treatment Medication management  Plan: 1. Continue for crisis management and stabilization. 2. Medication management to reduce current symptoms to base line and improve  the patient's overall level of functioning. 3. Treat health problems as indicated. 4. Develop treatment plan to decrease risk of relapse upon discharge and to reduce the need for readmission. 5. Psycho-social education regarding relapse prevention and self care. 6. Health care follow up as needed for medical problems. 7. Patient informed that he can decline the Librium if he feels he does not need it. 8. Disposition in progress. 9. After discussion with the patient and Dr. Dub Mikes will start Abilify 5mg  po X 1 on Saturday.   Medical Decision Making Problem Points:  Established problem, stable/improving (1), Review of last therapy session (1) and Review of psycho-social stressors (1) Data Points:  Review or order clinical lab tests (1) Review or order medicine tests (1) Review of medication regiment & side effects (2) Review of new medications or change in dosage (2)  I certify that inpatient services furnished can reasonably be expected to improve the patient's condition.   Beau Fanny, FNP-BC  7:21 PM 07/03/2013 I agreed with findings and treatment  plan of this patient

## 2013-07-03 NOTE — [facility] Group Notes (Signed)
[facility] LCSW Group Therapy Note   07/03/2013 1:15 PM   Type of Therapy and Topic: Group Therapy: Feelings Around Returning Home & Establishing a Supportive Framework and Activity to Identify signs of Improvement or Decompensation    Participation Level: None  Mood: Irritable  Description of Group:  Patients first processed thoughts and feelings about up coming discharge. These included fears of upcoming changes, lack of change, new living environments, judgements and expectations from others and overall stigma of MH issues. We then discussed what is a supportive framework? What does it look like feel like and how do I discern it from and unhealthy non-supportive network? Learn how to cope when supports are not helpful and don't support you. Discuss what to do when your family/friends are not supportive.   Therapeutic Goals Addressed in Processing Group:  1. Patient will identify one healthy supportive network that they can use at discharge. 2. Patient will identify one factor of a supportive framework and how to tell it from an unhealthy network. 3. Patient able to identify one coping skill to use when they do not have positive supports from others. 4. Patient will demonstrate ability to communicate their needs through discussion and/or role plays.  Summary of Patient Progress:  Summary of Progress/Problems: The main focus of today's process group was to identify the patient's current support system and decide on other supports that can be put in place. An emphasis was placed on using counselor, doctor, therapy groups, 12-step groups, and problem-specific support groups to expand supports. There was also an extensive discussion about what constitutes a healthy support versus an unhealthy support.  The patient expressed frustration with group within first five minutes and left not to return.   Carney Bernatherine C Sherrill Mckamie, LCSW

## 2013-07-03 NOTE — [facility] Group Notes (Signed)
[facility] Group Notes:  (Nursing/MHT/Case Management/Adjunct)  Date:  07/03/2013  Time:  1:39 PM  Type of Therapy:  Psychoeducational Skills/Healthy Support System  Participation Level:  Active  Participation Quality:  Appropriate, Sharing and Supportive  Affect:  Appropriate  Cognitive:  Alert and Appropriate  Insight:  Appropriate  Engagement in Group:  Engaged and Supportive  Modes of Intervention:  Discussion, Education and Support  Summary of Progress/Problems:  Pt. Was pleasant, sharing and gave helpful tips in group.  Zlatan Hornback Dawkins Mathhew Buysse 07/03/2013, 1:39 PM

## 2013-07-03 NOTE — Progress Notes (Signed)
D.  Thomas Atkins and cooperative.  Rated depression and hopelessness as a 0.  Attended group and was active and sharing in group.  Denies SI/HI  And denies withdrawals. A.  Encouragement and support given. R.  Pt. receptive

## 2013-07-03 NOTE — Progress Notes (Signed)
Writer spoke with patient 1:1 and he reports that he has had a good day and reports that his reason for coming in was for medication adjustment. He reports that he asked his psychiatrist to take him off zyprexa b/c he noticed increased anger and hostility. He refused his librium reporting that he is not detoxing and wants to come off the protocol. He denies si/hi/a/v hallucinatons. Safety maintained on unit with 15 min checks.

## 2013-07-03 NOTE — Progress Notes (Signed)
Adult Psychoeducational Group Note  Date:  07/03/2013 Time:  9:23 PM  Group Topic/Focus:  Wrap-Up Group:   The focus of this group is to help patients review their daily goal of treatment and discuss progress on daily workbooks.  Participation Level:  Minimal  Participation Quality:  Inattentive and Resistant  Affect:  Flat, Irritable and Resistant  Cognitive:  Appropriate  Insight: Lacking  Engagement in Group:  Lacking  Modes of Intervention:  Support  Additional Comments:  Pt was very short and abrunt when asked about his day by Clinical research associatewriter. Pt indicated that one positive thing that happened was that he was able to see his daughter today. Pt was given support  [name] 07/03/2013, 9:23 PM

## 2013-07-04 MED ORDER — ARIPIPRAZOLE 5 MG PO TABS: 5.0000 mg | ORAL_TABLET | Freq: Every day | ORAL | Status: DC

## 2013-07-04 MED ORDER — ARIPIPRAZOLE ER 400 MG IM SUSR: 400.0000 mg | Freq: Once | INTRAMUSCULAR | Status: DC

## 2013-07-04 MED ORDER — HYDROXYZINE HCL 25 MG PO TABS: 25.0000 mg | ORAL_TABLET | Freq: Four times a day (QID) | ORAL | Status: DC | PRN

## 2013-07-04 MED ORDER — FLUOXETINE HCL 10 MG PO CAPS: 10.0000 mg | ORAL_CAPSULE | Freq: Every day | ORAL | Status: DC

## 2013-07-04 MED ORDER — TRAZODONE HCL 50 MG PO TABS: 50.0000 mg | ORAL_TABLET | Freq: Every day | ORAL | Status: DC

## 2013-07-04 MED ORDER — ARIPIPRAZOLE 5 MG PO TABS
5.0000 mg | ORAL_TABLET | Freq: Every day | ORAL | Status: DC
  Filled 2013-07-04: qty 14

## 2013-07-04 NOTE — Discharge Summary (Signed)
Physician Discharge Summary Note  Patient:  Thomas Atkins is an 38 y.o., male MRN:  [id number] DOB:  1975/04/21 Patient phone:  [phone] (home)  Patient address:   [id number] [place] [id number],  Total Time spent with patient: 30 minutes  Date of Admission:  06/29/2013 Date of Discharge: 07/04/2013  Reason for Admission:  MDD  Discharge Diagnoses: Active Problems:   Alcohol-induced mood disorder   Psychiatric Specialty Exam:  Please see D/C SRA Physical Exam  ROS  Blood pressure 117/80, pulse 99, temperature 97.7 F (36.5 C), temperature source Oral, resp. rate 18, height 5\' 10"  (1.778 m), weight 76.658 kg (169 lb), SpO2 97.00%.Body mass index is 24.25 kg/(m^2).                              Past Psychiatric History: Diagnosis:  Hospitalizations:  Outpatient Care:  Substance Abuse Care:  Self-Mutilation:  Suicidal Attempts:  Violent Behaviors:   Musculoskeletal: Strength & Muscle Tone:  Gait & Station:  Patient leans:   DSM5:  Schizophrenia Disorders:   Obsessive-Compulsive Disorders:   Trauma-Stressor Disorders:   Substance/Addictive Disorders:   Depressive Disorders:    Axis Diagnosis:  Discharge Diagnoses:  AXIS I: Alcohol Dependence, Substance Induced Mood Disorder, Major Depression recurrent  AXIS II: Deferred  AXIS III:  Past Medical History   Diagnosis  Date   .  Syncope    .  Seizure disorder    .  Alcoholism     AXIS IV: other psychosocial or environmental problems  AXIS V: 61-70 mild symptoms   Level of Care:  OP  Hospital Course:  Thomas Atkins is a 38 year old SWM who presented to the [facility] BIB his family due to his worsening anger and agitation. He has a long history of mental illness and alcohol abuse.  Currently he is seeing Dr. Carmon Ginsberg'Neil as his out patient psychiatrist.  He had just completed a visit with his therapist who felt that he was in need of hospitalization and [facility] for [facility] evaluation and  stabilization.      Thomas Atkins presented to the ED reporting "anger at the world, and I'm afraid I'm going to lose control."  He has had homicidal thoughts about his father for years, as his father is a 40 year old heroin addict.  He reports lately that he is surrounded by stupidity and people who are ignorant and disrespectful of others and he just can't stand it.  He reported that he is depressed but he is not suicidal.         Thomas Atkins was given medical clearance and [facility]. Upon evaluation, Thomas Atkins stated that his biggest stressor is that his psychiatrist is not listening to him. He does not like being on Zyprexa. It has made him irritable, angry, and slowed his cognition. He has started drinking again to help with the symptoms. Thomas Atkins is very agitated that Dr. Carmon Ginsberg'Neil is not listening. He is very diligent about documenting his symptoms, he even did a "flow chart!"  He denies other substance abuse, and states he even gets mad at his AA meetings.       Medication management was discussed and initiated. He was started back on his Prozac, Trazodone and his Vistaril at his request. The Zyprexa was discontinued.  A librium protocol was initiated but not needed.       He was oriented to the unit  and encouraged to participate in unit programming. The patient was evaluated each day by a clinical provider to ascertain the patient's response to treatment.  Improvement was noted by the patient's report of decreasing symptoms, improved sleep and appetite, affect, medication tolerance, behavior, and participation in unit programming.         Thomas Atkins was asked each day to complete a self inventory noting mood, mental status, pain, new symptoms, anxiety and concerns.         He responded well to medication and being in a therapeutic and supportive environment. Positive and appropriate behavior was noted and the patient was motivated for recovery.  Thomas Atkins worked closely with the treatment team and case  manager to develop a discharge plan with appropriate goals. Coping skills, problem solving as well as relaxation therapies were also part of the unit programming.  He did agree to add an oral dose of Abilify to assist with treating his depression and he tolerated this well.         His behavior was appropriate and he did not need seclusion or restraint. He vehemently denied any plan, intent or desire to hurt anyone. He was grateful that someone had listened to his feelings.         By the day of discharge Thomas Atkins was in much improved condition than upon admission.  Symptoms were reported as significantly decreased or resolved completely. The patient denied SI/HI and voiced no AVH. He was motivated to continue taking medication with a goal of continued improvement in mental health. He was well supported by his family while admitted here.         Thomas Atkins was discharged home with a plan to follow up as noted below.   Consults:  None  Significant Diagnostic Studies:  None  Discharge Vitals:   Blood pressure 117/80, pulse 99, temperature 97.7 F (36.5 C), temperature source Oral, resp. rate 18, height 5\' 10"  (1.778 m), weight 76.658 kg (169 lb), SpO2 97.00%. Body mass index is 24.25 kg/(m^2). Lab Results:   No results found for this or any previous visit (from the past 72 hour(s)).  Physical Findings: AIMS: Facial and Oral Movements Muscles of Facial Expression: None, normal Lips and Perioral Area: None, normal Jaw: None, normal Tongue: None, normal,Extremity Movements Upper (arms, wrists, hands, fingers): None, normal Lower (legs, knees, ankles, toes): None, normal, Trunk Movements Neck, shoulders, hips: None, normal, Overall Severity Severity of abnormal movements (highest score from questions above): None, normal Incapacitation due to abnormal movements: None, normal Patient's awareness of abnormal movements (rate only patient's report): No Awareness, Dental Status Current problems with  teeth and/or dentures?: No Does patient usually wear dentures?: No  CIWA:  CIWA-Ar Total: 1 COWS:  COWS Total Score: 0  Psychiatric Specialty Exam: See Psychiatric Specialty Exam and Suicide Risk Assessment completed by Attending Physician prior to discharge.  Discharge destination:  Home  Is patient on multiple antipsychotic therapies at discharge:  No   Has Patient had three or more failed trials of antipsychotic monotherapy by history:  No  Recommended Plan for Multiple Antipsychotic Therapies: NA  Discharge Orders   Future Orders Complete By Expires   Diet - low sodium heart healthy  As directed    Discharge instructions  As directed    Increase activity slowly  As directed        Medication List    STOP taking these medications       diphenhydramine-acetaminophen 25-500 MG Tabs  Commonly known  as:  TYLENOL PM     olanzapine zydis 15 MG disintegrating tablet  Commonly known as:  ZYPREXA      TAKE these medications     Indication   ARIPiprazole 5 MG tablet  Commonly known as:  ABILIFY  Take 1 tablet (5 mg total) by mouth daily.   Indication:  Major Depressive Disorder     FLUoxetine 10 MG capsule  Commonly known as:  PROZAC  Take 1 capsule (10 mg total) by mouth daily.   Indication:  Depression     hydrOXYzine 25 MG tablet  Commonly known as:  ATARAX/VISTARIL  Take 1 tablet (25 mg total) by mouth 4 (four) times daily as needed for anxiety.   Indication:  Anxiety Neurosis     traZODone 50 MG tablet  Commonly known as:  DESYREL  Take 1 tablet (50 mg total) by mouth at bedtime. For insomnia.   Indication:  Trouble Sleeping           Follow-up Information   Follow up with [facility] Head[facility]  On 07/11/2013. (Monday, Jul 11, 2013 at 2:00 PM)    Contact information:   [address] [place], [place]   [address]  [phone]      Follow up with Lauris Poag - NP - [facility].   Contact information:   [address] [place], [place]   [address]  [phone]      Follow-up recommendations:   Activities: Resume activity as tolerated. Diet: Heart healthy low sodium diet Tests: Follow up testing will be determined by your out patient provider. Comments:    Total Discharge Time:  Less than 30 minutes.  Signed: Verne Spurr 07/04/2013, 11:15 AM Personally evaluated the patient and agree with assessment and plan Madie Reno A. Dub Mikes, M.D.

## 2013-07-04 NOTE — Progress Notes (Signed)
Patient ID: Thomas Atkins, male   DOB: 1975-03-19, 38 y.o.   MRN: [id number] [facility]  Thomas Atkins  MRN:  [id number] Subjective: Thomas Atkins is seen and his progress is noted. He reports that he is doing well, and hopes to discharge home soon. He denies SI/HI and voices no AVH. He denies any withdrawal symptoms from alcohol. He is agreeable to start oral Abilify to test for side effects with a possible goal of using Abilify Maintenna once he is discharged.  Diagnosis:   DSM5: Schizophrenia Disorders:   Obsessive-Compulsive Disorders:   Trauma-Stressor Disorders:   Substance/Addictive Disorders:  Alcohol Related Disorder - Moderate (303.90) Depressive Disorders:  Major Depressive Disorder - Severe (296.23) Total Time spent with patient: 25 minutes  Axis I: Alcohol Abuse, Bipolar, Manic, Major Depression, Recurrent severe and Substance Induced Mood Disorder Axis II: Deferred Axis III:  Past Medical History  Diagnosis Date  . Syncope   . Seizure disorder   . Alcoholism    Axis IV: other psychosocial or environmental problems and problems related to social environment Axis V: 51-60 moderate symptoms  ADL's:  Intact  Sleep: Good  Appetite:  Good  Suicidal Ideation:  denies Homicidal Ideation:  Denies   AEB (as evidenced by):  Psychiatric Specialty Exam: Physical Exam  Review of Systems  Constitutional: Negative.   HENT: Negative.   Eyes: Negative.   Respiratory: Negative.   Cardiovascular: Negative.   Gastrointestinal: Negative.   Genitourinary: Negative.   Musculoskeletal: Negative.   Skin: Negative.   Neurological: Negative.   Endo/Heme/Allergies: Negative.   Psychiatric/Behavioral: Positive for depression. The patient is nervous/anxious.     Blood pressure 117/80, pulse 99, temperature 97.7 F (36.5 C), temperature source Oral, resp. rate 18, height 5\' 10"  (1.778 m), weight 76.658 kg (169 lb), SpO2 97.00%.Body mass index is 24.25 kg/(m^2).  General  Appearance: Fairly Groomed  Patent attorneyye Contact::  Good  Speech:  Clear and Coherent  Volume:  Normal  Mood:  Euthymic  Affect:  Congruent  Thought Process:  Goal Directed  Orientation:  Full (Time, Place, and Person)  Thought Content:  WDL  Suicidal Thoughts:  No  Homicidal Thoughts:  No  Memory:  NA  Judgement:  Fair  Insight:  Present  Psychomotor Activity:  Normal  Concentration:  Fair  Recall:  Fair  Fund of Knowledge:Fair  Language: Good  Akathisia:  No  Handed:  Right  AIMS (if indicated):     Assets:  Communication Skills Desire for Improvement Physical Health  Sleep:  Number of Hours: 6.25   Musculoskeletal: Strength & Muscle Tone: within normal limits Gait & Station: normal Patient leans: N/A  Current Medications: No current facility-administered medications for this encounter.   Current Outpatient Prescriptions  Medication Sig Dispense Refill  . ARIPiprazole (ABILIFY) 5 MG tablet Take 1 tablet (5 mg total) by mouth daily.  30 tablet  0  . FLUoxetine (PROZAC) 10 MG capsule Take 1 capsule (10 mg total) by mouth daily.  30 capsule  0  . hydrOXYzine (ATARAX/VISTARIL) 25 MG tablet Take 1 tablet (25 mg total) by mouth 4 (four) times daily as needed for anxiety.  30 tablet  0  . traZODone (DESYREL) 50 MG tablet Take 1 tablet (50 mg total) by mouth at bedtime. For insomnia.  30 tablet  0    Lab Results: No results found for this or any previous visit (from the past 48 hour(s)).  Physical Findings: AIMS: Facial and Oral Movements Muscles of Facial  Expression: None, normal Lips and Perioral Area: None, normal Jaw: None, normal Tongue: None, normal,Extremity Movements Upper (arms, wrists, hands, fingers): None, normal Lower (legs, knees, ankles, toes): None, normal, Trunk Movements Neck, shoulders, hips: None, normal, Overall Severity Severity of abnormal movements (highest score from questions above): None, normal Incapacitation due to abnormal movements: None,  normal Patient's awareness of abnormal movements (rate only patient's report): No Awareness, Dental Status Current problems with teeth and/or dentures?: No Does patient usually wear dentures?: No  CIWA:  CIWA-Ar Total: 1 COWS:  COWS Total Score: 0  Treatment Plan Summary: Daily contact with patient to assess and evaluate symptoms and progress in treatment Medication management  Plan: 1. Continue for crisis management and stabilization. 2. Medication management to reduce current symptoms to base line and improve the patient's overall level of functioning. 3. Treat health problems as indicated. 4. Develop treatment plan to decrease risk of relapse upon discharge and to reduce the need for readmission. 5. Psycho-social education regarding relapse prevention and self care. 6. Health care follow up as needed for medical problems. 7. Patient informed that he can decline the Librium if he feels he does not need it. 8. Disposition in progress. 9. After discussion with the patient and Dr. Dub MikesLugo will start Abilify 5mg  po X 1 on Saturday.   Medical Decision Making Problem Points:  Established problem, stable/improving (1), Review of last therapy session (1) and Review of psycho-social stressors (1) Data Points:  Review or order clinical lab tests (1) Review or order medicine tests (1) Review of medication regiment & side effects (2) Review of new medications or change in dosage (2)  I certify that inpatient services furnished can reasonably be expected to improve the patient's condition.   Rona RavensNeil T. Mashburn [facility] 07/04/2013  Personally evaluated the patient and agree with assessment and plan Madie RenoIrving A. Dub MikesLugo, M.D.

## 2013-07-04 NOTE — Progress Notes (Signed)
Patient ID: Thomas Atkins, male   DOB: 07/24/1975, 38 y.o.   MRN: [id number] Patient is discharged ambulatory to ride home with his mother.  He denies SI/HI.  He is not having any thoughts of hurting is father and says that since he stopped taking zyprexia he is no longer having angry feelings and is not having any thoughts of harming anyone.  He is verbalizing understanding of his discharge meds and followup.  He was given a 2 week supply of his meds and scripts.  Belongings were returned from locker and patient is expressing gratitude for care received here.

## 2013-07-04 NOTE — Tx Team (Addendum)
Interdisciplinary Treatment Plan Update   Date Reviewed:  07/04/2013  Time Reviewed:  9:57 AM  Progress in Treatment:   Attending groups: Yes Participating in groups: Yes Taking medication as prescribed: Yes  Tolerating medication: Yes Family/Significant other contact made: Yes, collateral contact with mother. Patient understands diagnosis: Yes  Discussing patient identified problems/goals with staff: Yes Medical problems stabilized or resolved: Yes Denies suicidal/homicidal ideation: Yes Patient has not harmed self or others: Yes  For review of initial/current patient goals, please see plan of care.  Estimated Length of Stay: Discharged today  Reasons for Continued Hospitalization:   New Problems/Goals identified:    Discharge Plan or Barriers:    Additional Comments:   No duty to warn.  Patient did not display any serious threat to his father during admission or at time of discharge.  Attendees:  Patient:   Thomas Atkins 07/04/2013 9:57 AM   Signature: Geoffery LyonsIrving Lugo, MD 07/04/2013 9:57 AM  Signature:  Verne SpurrNeil Mashburn, PA 07/04/2013 9:57 AM  Signature:  Liborio NixonPatrice White, RN 07/04/2013 9:57 AM  Signature:Beverly Terrilee CroakKnight, RN 07/04/2013 9:57 AM  Signature:  Neill Loftarol Davis, RN 07/04/2013 9:57 AM  Signature:  Juline PatchQuylle Stacia Feazell, LCSW 07/04/2013 9:57 AM  Signature:  Reyes Ivanhelsea Horton, LCSW 07/04/2013 9:57 AM  Signature:  Leisa LenzValerie Enoch, Care Coordinator [facility] 07/04/2013 9:57 AM  Signature:  Stephan MinisterMorgan Armstrong, PA Student 07/04/2013 9:57 AM  Signature:  07/04/2013  9:57 AM  Signature:   Onnie BoerJennifer Clark, RN [facility] 07/04/2013  9:57 AM  Signature:  07/04/2013  9:57 AM    Scribe for Treatment Team:   Juline PatchQuylle Jourdin Connors,  07/04/2013 9:57 AM

## 2013-07-04 NOTE — Progress Notes (Signed)
Patient has been up and active on the unit, attended group this evening and has voiced no complaints. Patient is hopeful to discharge on tomorrow. Patient currently denies having pain, -si/hi/a/v hall. Support and encouragement offered, safety maintained on unit, will continue to monitor.

## 2013-07-04 NOTE — [facility] Suicide Risk Assessment (Signed)
Suicide Risk Assessment  Discharge Assessment     Demographic Factors:  Male and Caucasian  Total Time spent with patient: 45 minutes  Psychiatric Specialty Exam:     Blood pressure 117/80, pulse 99, temperature 97.7 F (36.5 C), temperature source Oral, resp. rate 18, height 5\' 10"  (1.778 m), weight 76.658 kg (169 lb), SpO2 97.00%.Body mass index is 24.25 kg/(m^2).  General Appearance: Fairly Groomed  Patent attorneyye Contact::  Fair  Speech:  Clear and Coherent  Volume:  Normal  Mood:  Euthymic  Affect:  Appropriate  Thought Process:  Coherent and Goal Directed  Orientation:  Full (Time, Place, and Person)  Thought Content:  plans as he moves on, relapse prevention plan, self care, coping skills  Suicidal Thoughts:  No  Homicidal Thoughts:  No  Memory:  Immediate;   Fair Recent;   Fair Remote;   Fair  Judgement:  Fair  Insight:  Present  Psychomotor Activity:  Normal  Concentration:  Fair  Recall:  FiservFair  Fund of Knowledge:NA  Language: Fair  Akathisia:  No  Handed:    AIMS (if indicated):     Assets:  Desire for Improvement Housing Social Support Vocational/Educational  Sleep:  Number of Hours: 6.25    Musculoskeletal: Strength & Muscle Tone: within normal limits Gait & Station: normal Patient leans: N/A   Mental Status Per Nursing Assessment::   On Admission:  Plan to harm others  Current Mental Status by Physician: In full contact with reality. There are no active S/S of withdrawal. His mood is euthymic, affect is appropriate. No SI, HI plans or intent. States he would have never hurt anyone. States looking back that the medication he was taking was agitating him, making him angry. Off the medication, now he states he is back to being himself. Plans to stay with his father temporarily until he gets a job and can move out.   Loss Factors: NA  Historical Factors: NA  Risk Reduction Factors:   Sense of responsibility to family, Positive social support and Positive  therapeutic relationship  Continued Clinical Symptoms:  Alcohol/Substance Abuse/Dependencies, Comorbid depression  Cognitive Features That Contribute To Risk:  Polarized thinking Thought constriction (tunnel vision)    Suicide Risk:  Minimal: No identifiable suicidal ideation.  Patients presenting with no risk factors but with morbid ruminations; may be classified as minimal risk based on the severity of the depressive symptoms  Discharge Diagnoses:   AXIS I:  Alcohol Dependence, Substance Induced Mood Disorder, Major Depression recurrent AXIS II:  Deferred AXIS III:   Past Medical History  Diagnosis Date  . Syncope   . Seizure disorder   . Alcoholism    AXIS IV:  other psychosocial or environmental problems AXIS V:  61-70 mild symptoms  Plan Of Care/Follow-up recommendations:  Activity:  as tolerated Diet:  regular Follow up Family Services/[facility] Is patient on multiple antipsychotic therapies at discharge:  No   Has Patient had three or more failed trials of antipsychotic monotherapy by history:  No  Recommended Plan for Multiple Antipsychotic Therapies: NA    Rachael FeeIrving A Carzell Atkins 07/04/2013, 12:30 PM

## 2013-07-04 NOTE — Progress Notes (Signed)
[facility] :  Will you be returning to the same living situation after discharge: Yes,  Patient to discharge to his home. At discharge, do you have transportation home?:Yes,  Patient to arrange transportation home. Do you have the ability to pay for your medications:No.  Patient will be assisted with indigent medications.   Release of information consent forms completed and in the chart;  Patient's signature needed at discharge.  Patient to Follow up at: Follow-up Information   Follow up with Madelin HeadingsAudrey Coston - [facility]  On 07/11/2013. (Monday, Jul 11, 2013 at 2:00 PM)    Contact information:   [address] [place], [place]   [address]  [phone]      Follow up with Lauris PoagLeslie O'Neal - NP - [facility].   Contact information:   [address] [place], [place]   [address]  [phone]      Patient denies SI/HI:  Patient no longer endorsing SI/HI or other thoughts of self harm.   Safety Planning and Suicide Prevention discussed:  .Reviewed with all patients during discharge planning group   Hayes Czaja Hairston Larron Armor 07/04/2013, 9:57 AM

## 2013-07-04 NOTE — [facility] Group Notes (Signed)
[facility]   07/04/2013 9:39 AM    Participation Quality:  Appropraite  Mood/Affect:  Appropriate  Depression Rating:  2  Anxiety Rating:  0  Thoughts of Suicide:  No  Will you contract for safety?   NA  Current AVH:  No  Plan for Discharge/Comments:  Patient attended discharge planning group and actively participated in group.  [facility]  CSW provided all participants with daily workbook.   Transportation Means: Patient has transportation.   Supports:  Patient has a support system.   Nimrit Kehres, Joesph JulyQuylle Hairston

## 2013-07-04 NOTE — Progress Notes (Signed)
[facility] :  Will you be returning to the same living situation after discharge: Yes,  Patient to discharge to his home. At discharge, do you have transportation home?:Yes,  Patient to arrange transportation home. Do you have the ability to pay for your medications:No.  Patient will be assisted with indigent medications.   Release of information consent forms completed and in the chart;  Patient's signature needed at discharge.  Patient to Follow up at: Follow-up Information   Follow up with Madelin HeadingsAudrey Coston - [facility]  On 07/11/2013. (Monday, Jul 11, 2013 at 2:00 PM)    Contact information:   [address] [place], [place]   [address]  [phone]      Follow up with Lauris PoagLeslie O'Neal - NP - [facility].   Contact information:   [address] [place], [place]   [address]  [phone]      Patient denies SI/HI:  Patient no longer endorsing SI/HI or other thoughts of self harm.   Safety Planning and Suicide Prevention discussed:  .Reviewed with all patients during discharge planning group   Noriah Osgood Hairston Qais Jowers 07/04/2013, 9:47 AM

## 2013-07-08 NOTE — Progress Notes (Signed)
Patient Discharge Instructions:  After Visit Summary (AVS):   Faxed to:  07/08/13 Psychiatric Admission Assessment Note:   Faxed to:  07/08/13 Suicide Risk Assessment - Discharge Assessment:   Faxed to:  07/08/13 Faxed/Sent to the Next Level Care provider:  07/08/13 [facility]axed to [facility] @ [phone]  Jerelene ReddenSheena E Coamo, 07/08/2013, 2:14 PM

## 2013-07-30 ENCOUNTER — Emergency Department (HOSPITAL_COMMUNITY)
Admission: EM | Admit: 2013-07-30 | Discharge: 2013-07-30 | Disposition: A | Discharge status: Home / Self Care | Payer: No Typology Code available for payment source | Attending: Emergency Medicine | Admitting: Emergency Medicine

## 2013-07-30 ENCOUNTER — Encounter (HOSPITAL_COMMUNITY): Payer: Self-pay | Admitting: Emergency Medicine

## 2013-07-30 DIAGNOSIS — F3289 Other specified depressive episodes: Secondary | ICD-10-CM | POA: Insufficient documentation

## 2013-07-30 DIAGNOSIS — F32A Depression, unspecified: Secondary | ICD-10-CM

## 2013-07-30 DIAGNOSIS — F919 Conduct disorder, unspecified: Secondary | ICD-10-CM | POA: Insufficient documentation

## 2013-07-30 DIAGNOSIS — F419 Anxiety disorder, unspecified: Secondary | ICD-10-CM

## 2013-07-30 DIAGNOSIS — F1021 Alcohol dependence, in remission: Secondary | ICD-10-CM | POA: Insufficient documentation

## 2013-07-30 DIAGNOSIS — F329 Major depressive disorder, single episode, unspecified: Secondary | ICD-10-CM | POA: Insufficient documentation

## 2013-07-30 DIAGNOSIS — F411 Generalized anxiety disorder: Secondary | ICD-10-CM | POA: Insufficient documentation

## 2013-07-30 DIAGNOSIS — Z79899 Other long term (current) drug therapy: Secondary | ICD-10-CM | POA: Insufficient documentation

## 2013-07-30 DIAGNOSIS — F17200 Nicotine dependence, unspecified, uncomplicated: Secondary | ICD-10-CM | POA: Insufficient documentation

## 2013-07-30 DIAGNOSIS — Z760 Encounter for issue of repeat prescription: Secondary | ICD-10-CM | POA: Insufficient documentation

## 2013-07-30 DIAGNOSIS — G4700 Insomnia, unspecified: Secondary | ICD-10-CM | POA: Insufficient documentation

## 2013-07-30 DIAGNOSIS — G40909 Epilepsy, unspecified, not intractable, without status epilepticus: Secondary | ICD-10-CM | POA: Insufficient documentation

## 2013-07-30 HISTORY — DX: Depression, unspecified: F32.A

## 2013-07-30 HISTORY — DX: Major depressive disorder, single episode, unspecified: F32.9

## 2013-07-30 MED ORDER — TRAZODONE HCL 50 MG PO TABS: 100.0000 mg | ORAL_TABLET | Freq: Every day | ORAL | Status: DC

## 2013-07-30 MED ORDER — TRAZODONE HCL 50 MG PO TABS: 50.0000 mg | ORAL_TABLET | Freq: Every day | ORAL | Status: DC

## 2013-07-30 MED ORDER — HYDROXYZINE HCL 25 MG PO TABS: 25.0000 mg | ORAL_TABLET | Freq: Four times a day (QID) | ORAL | Status: DC | PRN

## 2013-07-30 NOTE — ED Provider Notes (Signed)
CSN: [id number]     Arrival date & time 07/30/13  2053 History  This chart was scribed for non-physician practitioner, Antony MaduraKelly Westin Knotts, PA-C working with Junius ArgyleForrest S Harrison, MD by Luisa DagoPriscilla Tutu, ED scribe. This patient was seen in room WTR3/WLPT3 and the patient's care was started at 10:12 PM.     Chief Complaint  Patient presents with  . Depression   The history is provided by the patient. No language interpreter was used.   HPI Comments: Thomas Atkins is a 38 y.o. male who presents to the Emergency Department complaining of increased anxiety. Pt states that he has been off his medication for about 3 days. He states that he has been talking to health services but they states that they will not be able to fill his medication until next month. Pt states that his anxiety has been increasing since he has been off his medication. He states that he also wants his insomnia medication filled. Pt states that he is not out of his depression medication. He denies any SI and HI. Denies any other pertinent medical problems.    Past Medical History  Diagnosis Date  . Syncope   . Seizure disorder   . Alcoholism   . Depression    Past Surgical History  Procedure Laterality Date  . Surgery scrotal / testicular     Family History  Problem Relation Age of Onset  . Heart disease Paternal Grandmother    History  Substance Use Topics  . Smoking status: Current Every Day Smoker -- 0.50 packs/day    Types: Cigarettes  . Smokeless tobacco: Not on file  . Alcohol Use: Yes    Review of Systems  Psychiatric/Behavioral: Positive for behavioral problems. Negative for suicidal ideas. The patient is nervous/anxious.   All other systems reviewed and are negative.   Allergies  Ultram  Home Medications   Prior to Admission medications   Medication Sig Start Date End Date Taking? Authorizing Provider  ARIPiprazole (ABILIFY) 5 MG tablet Take 1 tablet (5 mg total) by mouth daily. 07/04/13   Verne SpurrNeil Mashburn, PA-C   FLUoxetine (PROZAC) 10 MG capsule Take 1 capsule (10 mg total) by mouth daily. 07/04/13   Verne SpurrNeil Mashburn, PA-C  hydrOXYzine (ATARAX/VISTARIL) 25 MG tablet Take 1 tablet (25 mg total) by mouth 4 (four) times daily as needed for anxiety. 07/04/13   Verne SpurrNeil Mashburn, PA-C  traZODone (DESYREL) 50 MG tablet Take 1 tablet (50 mg total) by mouth at bedtime. For insomnia. 07/04/13   Verne SpurrNeil Mashburn, PA-C   BP 112/80  Pulse 101  Temp(Src) 98 F (36.7 C) (Oral)  Resp 14  SpO2 97%  Physical Exam  Nursing note and vitals reviewed. Constitutional: He is oriented to person, place, and time. He appears well-developed and well-nourished. No distress.  Nontoxic/nonseptic appearing  HENT:  Head: Normocephalic and atraumatic.  Eyes: Conjunctivae and EOM are normal. No scleral icterus.  Neck: Normal range of motion.  Cardiovascular: Regular rhythm and intact distal pulses.   Borderline tachycardic HR; Distal radial pulse 2+ in LUE.  Pulmonary/Chest: Effort normal. No respiratory distress.  Musculoskeletal: Normal range of motion.  Neurological: He is alert and oriented to person, place, and time.  GCS 15. Speech is goal oriented. Patient moves extremities without ataxia.  Skin: Skin is warm and dry. No rash noted. He is not diaphoretic. No erythema. No pallor.  Psychiatric: His mood appears anxious. His speech is rapid and/or pressured (mild). Cognition and memory are normal. He expresses no homicidal and  no suicidal ideation. He expresses no suicidal plans and no homicidal plans.    ED Course  Procedures (including critical care time)  DIAGNOSTIC STUDIES: Oxygen Saturation is 97% on RA, adequate by my interpretation.    COORDINATION OF CARE: 10:18 PM- Pt advised of plan for treatment and pt agrees.  Labs Review Labs Reviewed - No data to display  Imaging Review No results found.   EKG Interpretation None      MDM   Final diagnoses:  Medication refill  Depression  Anxiety    Patient  presents for refill of his psychiatric medications. Patient states that he has been out of his medications for 3 days and this has been worsening his anxiety and depression. Patient states he needs hydroxyzine and trazodone refilled. I have reviewed the patient's chart which states his hydroxyzine should be taken 4 times a day as needed for anxiety. Patient had 30 tablets filled on 07/18/2013 and he states he has needed all 4 tablets daily. Story consistent with prescription history and, at this rate, patient would require a refill.  Have also reviewed prescription for trazodone. Most recent note makes mention of 50 mg each bedtime for insomnia. Patient states he was told by his doctor that he may take 2 tablets as needed for insomnia. This is not noted on the prescription; however, trazodone may be prescribed up to 100 mg each bedtime for insomnia. At this rate, patient should almost see out of this prescription. Will refill prescription for trazodone as well, but have emphasized to the patient that any further refills must be completed by his psychiatrist. He states he is going to followup with her as soon as he can, but she has not been returning his calls. Patient denies SI/HI today. No command hallucinations. He is stable for discharge with necessary return precautions. Patient agreeable to plan with no unaddressed concerns.  I personally performed the services described in this documentation, which was scribed in my presence. The recorded information has been reviewed and is accurate.   Filed Vitals:   07/30/13 2111 07/30/13 2244  BP: 112/80 100/77  Pulse: 101 104  Temp: 98 F (36.7 C)   TempSrc: Oral   Resp: 14 18  SpO2: 97% 98%     Antony Madura, PA-C 07/30/13 2302

## 2013-07-30 NOTE — Discharge Instructions (Signed)
Depression, Adult Depression is feeling sad, low, down in the dumps, blue, gloomy, or empty. In general, there are two kinds of depression:  Normal sadness or grief. This can happen after something upsetting. It often goes away on its own within 2 weeks. After losing a loved one (bereavement), normal sadness and grief may last longer than two weeks. It usually gets better with time.  Clinical depression. This kind lasts longer than normal sadness or grief. It keeps you from doing the things you normally do in life. It is often hard to function at home, work, or at school. It may affect your relationships with others. Treatment is often needed. GET HELP RIGHT AWAY IF:  You have thoughts about hurting yourself or others.  You lose touch with reality (psychotic symptoms). You may:  See or hear things that are not real.  Have untrue beliefs about your life or people around you.  Your medicine is giving you problems. MAKE SURE YOU:  Understand these instructions.  Will watch your condition.  Will get help right away if you are not doing well or get worse. Document Released: 03/29/2010 Document Revised: 11/19/2011 Document Reviewed: 06/26/2011 [facility] Patient Information 2014 [place], [place].  Medication Refill, Emergency Department We have refilled your medication today as a courtesy to you. It is best for your medical care, however, to take care of getting refills done through your primary caregiver's office. They have your records and can do a better job of follow-up than we can in the emergency department. On maintenance medications, we often only prescribe enough medications to get you by until you are able to see your regular caregiver. This is a more expensive way to refill medications. In the future, please plan for refills so that you will not have to use the emergency department for this. Thank you for your help. Your help allows [place] to better take care of the daily emergencies  that enter our department. Document Released: 06/13/2003 Document Revised: 05/19/2011 Document Reviewed: 02/24/2005 [facility] Patient Information 2014 [place], [place].

## 2013-07-30 NOTE — ED Notes (Signed)
Patient is alert and oriented x3.  He was seen at [facility] about a month ago and he has been trying to go Through family services to get his medications but has not been able to get his medications filled.  He  States that he has been sliding deeper into depression.  Currently he denies any pain.

## 2013-07-30 NOTE — ED Notes (Signed)
Patient is alert and oriented x3.  He was given DC instructions and follow up visit instructions.  Patient gave verbal understanding.  He was DC ambulatory under his own power to home.  V/S stable.  He was not showing any signs of distress on DC 

## 2013-07-31 NOTE — ED Provider Notes (Signed)
Medical screening examination/treatment/procedure(s) were performed by non-physician practitioner and as supervising physician I was immediately available for consultation/collaboration.   EKG Interpretation None        Kaarin Pardy S Calle Schader, MD 07/31/13 0000 

## 2013-08-15 ENCOUNTER — Ambulatory Visit: Payer: Self-pay

## 2013-12-01 ENCOUNTER — Emergency Department (HOSPITAL_COMMUNITY)
Admission: EM | Admit: 2013-12-01 | Discharge: 2013-12-03 | Disposition: A | Discharge status: Home / Self Care | Payer: Self-pay | Attending: Emergency Medicine | Admitting: Emergency Medicine

## 2013-12-01 ENCOUNTER — Encounter (HOSPITAL_COMMUNITY): Payer: Self-pay | Admitting: Emergency Medicine

## 2013-12-01 DIAGNOSIS — F1010 Alcohol abuse, uncomplicated: Secondary | ICD-10-CM | POA: Insufficient documentation

## 2013-12-01 DIAGNOSIS — F1020 Alcohol dependence, uncomplicated: Secondary | ICD-10-CM | POA: Diagnosis present

## 2013-12-01 DIAGNOSIS — F1094 Alcohol use, unspecified with alcohol-induced mood disorder: Secondary | ICD-10-CM

## 2013-12-01 DIAGNOSIS — Z79899 Other long term (current) drug therapy: Secondary | ICD-10-CM | POA: Insufficient documentation

## 2013-12-01 DIAGNOSIS — F329 Major depressive disorder, single episode, unspecified: Secondary | ICD-10-CM

## 2013-12-01 DIAGNOSIS — F10988 Alcohol use, unspecified with other alcohol-induced disorder: Secondary | ICD-10-CM | POA: Insufficient documentation

## 2013-12-01 DIAGNOSIS — F10230 Alcohol dependence with withdrawal, uncomplicated: Secondary | ICD-10-CM

## 2013-12-01 DIAGNOSIS — F411 Generalized anxiety disorder: Secondary | ICD-10-CM | POA: Insufficient documentation

## 2013-12-01 DIAGNOSIS — F32A Depression, unspecified: Secondary | ICD-10-CM | POA: Diagnosis present

## 2013-12-01 DIAGNOSIS — F17200 Nicotine dependence, unspecified, uncomplicated: Secondary | ICD-10-CM | POA: Insufficient documentation

## 2013-12-01 DIAGNOSIS — F1210 Cannabis abuse, uncomplicated: Secondary | ICD-10-CM | POA: Insufficient documentation

## 2013-12-01 DIAGNOSIS — F10239 Alcohol dependence with withdrawal, unspecified: Secondary | ICD-10-CM

## 2013-12-01 DIAGNOSIS — F3289 Other specified depressive episodes: Secondary | ICD-10-CM | POA: Insufficient documentation

## 2013-12-01 DIAGNOSIS — R259 Unspecified abnormal involuntary movements: Secondary | ICD-10-CM | POA: Insufficient documentation

## 2013-12-01 HISTORY — DX: Anxiety disorder, unspecified: F41.9

## 2013-12-01 LAB — SALICYLATE LEVEL

## 2013-12-01 LAB — COMPREHENSIVE METABOLIC PANEL
ALT: 45 U/L (ref 0–53)
AST: 43 U/L — AB (ref 0–37)
Albumin: 4 g/dL (ref 3.5–5.2)
Alkaline Phosphatase: 60 U/L (ref 39–117)
Anion gap: 15 (ref 5–15)
BILIRUBIN TOTAL: 0.7 mg/dL (ref 0.3–1.2)
BUN: 11 mg/dL (ref 6–23)
CALCIUM: 9.4 mg/dL (ref 8.4–10.5)
CO2: 23 mEq/L (ref 19–32)
Chloride: 100 mEq/L (ref 96–112)
Creatinine, Ser: 0.93 mg/dL (ref 0.50–1.35)
Glucose, Bld: 89 mg/dL (ref 70–99)
POTASSIUM: 4.3 meq/L (ref 3.7–5.3)
Sodium: 138 mEq/L (ref 137–147)
Total Protein: 7.4 g/dL (ref 6.0–8.3)

## 2013-12-01 LAB — CBC
HCT: 43.7 % (ref 39.0–52.0)
Hemoglobin: 15.2 g/dL (ref 13.0–17.0)
MCH: 33.2 pg (ref 26.0–34.0)
MCHC: 34.8 g/dL (ref 30.0–36.0)
MCV: 95.4 fL (ref 78.0–100.0)
Platelets: 200 10*3/uL (ref 150–400)
RBC: 4.58 MIL/uL (ref 4.22–5.81)
RDW: 13.4 % (ref 11.5–15.5)
WBC: 6.2 10*3/uL (ref 4.0–10.5)

## 2013-12-01 LAB — RAPID URINE DRUG SCREEN, HOSP PERFORMED
Amphetamines: NOT DETECTED
Barbiturates: NOT DETECTED
Benzodiazepines: NOT DETECTED
Cocaine: NOT DETECTED
Opiates: NOT DETECTED
TETRAHYDROCANNABINOL: POSITIVE — AB

## 2013-12-01 LAB — ETHANOL: Alcohol, Ethyl (B): <11 mg/dL (ref 0–11)

## 2013-12-01 LAB — ACETAMINOPHEN LEVEL

## 2013-12-01 MED ORDER — LORAZEPAM 1 MG PO TABS
0.0000 mg | ORAL_TABLET | Freq: Four times a day (QID) | ORAL | Status: AC
  Administered 2013-12-01 – 2013-12-02 (×3): 1 mg via ORAL
  Filled 2013-12-01 (×4): qty 1

## 2013-12-01 MED ORDER — TRAZODONE HCL 50 MG PO TABS
50.0000 mg | ORAL_TABLET | Freq: Every day | ORAL | Status: DC
  Administered 2013-12-01 – 2013-12-02 (×2): 100 mg via ORAL
  Filled 2013-12-01 (×2): qty 2

## 2013-12-01 MED ORDER — LORAZEPAM 1 MG PO TABS
0.0000 mg | ORAL_TABLET | Freq: Two times a day (BID) | ORAL | Status: DC
  Administered 2013-12-03: 1 mg via ORAL

## 2013-12-01 MED ORDER — ZOLPIDEM TARTRATE 5 MG PO TABS: 5.0000 mg | ORAL_TABLET | Freq: Every evening | ORAL | Status: DC | PRN

## 2013-12-01 MED ORDER — HYDROXYZINE HCL 25 MG PO TABS
25.0000 mg | ORAL_TABLET | Freq: Every evening | ORAL | Status: DC | PRN
  Administered 2013-12-01 – 2013-12-02 (×2): 25 mg via ORAL
  Filled 2013-12-01 (×2): qty 1

## 2013-12-01 MED ORDER — IBUPROFEN 200 MG PO TABS: 600.0000 mg | ORAL_TABLET | Freq: Three times a day (TID) | ORAL | Status: DC | PRN

## 2013-12-01 MED ORDER — FLUOXETINE HCL 20 MG PO CAPS
40.0000 mg | ORAL_CAPSULE | Freq: Every day | ORAL | Status: DC
  Administered 2013-12-01 – 2013-12-02 (×2): 40 mg via ORAL
  Filled 2013-12-01 (×5): qty 2

## 2013-12-01 NOTE — Progress Notes (Signed)
[facility] Community Liaison [place],  [place] with patient about [employer]. Patient had card with Family Medicine at [facility] 09/24/11-03/26/12. Encouraged patient to renew [facility] Card for primary care. Provided pt with application and information on [facility] the [place].

## 2013-12-01 NOTE — ED Provider Notes (Signed)
CSN: [id number]     Arrival date & time 12/01/13  1345 History   First MD Initiated Contact with Patient 12/01/13 1500     Chief Complaint  Patient presents with  . detox     ETOH     (Consider location/radiation/quality/duration/timing/severity/associated sxs/prior Treatment) HPI 38 year old male presents to the ED requesting detox. Over last 1 month has had worsening depression and alcohol abuse. He's gone through detox before. He last drink alcohol last night around 6 PM. Since then his been having nausea, shaking, and "sick as a dog". He is this time to come back in to behavioral health to his life straightened out. He denies any suicidal or homicidal ideations. He drinks approximately 200 ounces of beer per day. Patient denies having any seizures or having gone through withdrawals. Endorses marijuana use recently but no other drug use.  Past Medical History  Diagnosis Date  . Syncope   . Seizure disorder   . Alcoholism   . Depression   . Anxiety    Past Surgical History  Procedure Laterality Date  . Surgery scrotal / testicular     Family History  Problem Relation Age of Onset  . Heart disease Paternal Grandmother    History  Substance Use Topics  . Smoking status: Current Every Day Smoker -- 0.50 packs/day    Types: Cigarettes  . Smokeless tobacco: Not on file  . Alcohol Use: 7.2 oz/week    12 Cans of beer per week     Comment: 200oz/day     Review of Systems  Cardiovascular: Negative for chest pain.  Gastrointestinal: Negative for vomiting.  Neurological: Positive for tremors.  Psychiatric/Behavioral: Positive for dysphoric mood. Negative for suicidal ideas.  All other systems reviewed and are negative.     Allergies  Ultram  Home Medications   Prior to Admission medications   Medication Sig Start Date End Date Taking? Authorizing Provider  ARIPiprazole (ABILIFY) 5 MG tablet Take 1 tablet (5 mg total) by mouth daily. 07/04/13   Verne Spurr, PA-C   FLUoxetine (PROZAC) 10 MG capsule Take 1 capsule (10 mg total) by mouth daily. 07/04/13   Verne Spurr, PA-C  hydrOXYzine (ATARAX/VISTARIL) 25 MG tablet Take 1 tablet (25 mg total) by mouth 4 (four) times daily as needed for anxiety. 07/30/13   Antony Madura, PA-C  traZODone (DESYREL) 50 MG tablet Take 1-2 tablets (50-100 mg total) by mouth at bedtime. For insomnia. 07/30/13   Antony Madura, PA-C   BP 136/102  Pulse 76  Temp(Src) 97.6 F (36.4 C) (Oral)  Resp 20  SpO2 98% Physical Exam  Nursing note and vitals reviewed. Constitutional: He is oriented to person, place, and time. He appears well-developed and well-nourished.  HENT:  Head: Normocephalic and atraumatic.  Right Ear: External ear normal.  Left Ear: External ear normal.  Nose: Nose normal.  Eyes: Right eye exhibits no discharge. Left eye exhibits no discharge.  Neck: Neck supple.  Cardiovascular: Normal rate, regular rhythm, normal heart sounds and intact distal pulses.   Pulmonary/Chest: Effort normal and breath sounds normal.  Abdominal: He exhibits no distension.  Musculoskeletal: He exhibits no edema.  Neurological: He is alert and oriented to person, place, and time. He displays tremor (mild).  Skin: Skin is warm and dry.  Psychiatric: He has a normal mood and affect. His speech is normal and behavior is normal. He expresses no homicidal and no suicidal ideation.    ED Course  Procedures (including critical care time) Labs Review Labs  Reviewed  COMPREHENSIVE METABOLIC PANEL - Abnormal; Notable for the following:    AST 43 (*)    All other components within normal limits  URINE RAPID DRUG SCREEN (HOSP PERFORMED) - Abnormal; Notable for the following:    Tetrahydrocannabinol POSITIVE (*)    All other components within normal limits  SALICYLATE LEVEL - Abnormal; Notable for the following:    Salicylate Lvl <2.0 (*)    All other components within normal limits  CBC  ETHANOL  ACETAMINOPHEN LEVEL    Imaging  Review No results found.   EKG Interpretation   Date/Time:  Thursday December 01 2013 14:06:58 EDT Ventricular Rate:  80 PR Interval:  140 QRS Duration: 98 QT Interval:  392 QTC Calculation: 452 R Axis:   42 Text Interpretation:  Normal sinus rhythm Possible Left atrial enlargement  Incomplete right bundle branch block T wave abnormality, consider inferior  ischemia Abnormal ECG artifact in limb leads limits interpretation  Otherwise no significant change since 2014 Confirmed by Jozalynn Noyce  MD,  Mindel Friscia ([id number]) on 12/01/2013 3:17:24 PM      MDM   Final diagnoses:  Alcohol-induced mood disorder    Patient has been medically cleared. Mild tremors but no signs of frank withdrawal. Will give ativan and place on CIWA and consult psychiatry for dispo for alcohol detox. No SI/HI    Audree Camel, MD 12/01/13 2156
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
## 2013-12-01 NOTE — ED Notes (Signed)
Patient is desiring inpatient treatment for medication adjustment and etoh detox.  He is cooperative and in NAD.

## 2013-12-01 NOTE — ED Notes (Signed)
Pt here requesting detox from ETOH. Pt states that normally drinks on avg 200 oz of beer per day. Pt last drink was around 1800 last night and had about 140oz of beer.   Pt states that he woke up this morning shaking, anxiety, nauseated.

## 2013-12-02 ENCOUNTER — Encounter (HOSPITAL_COMMUNITY): Payer: Self-pay | Admitting: Psychiatry

## 2013-12-02 DIAGNOSIS — F1010 Alcohol abuse, uncomplicated: Secondary | ICD-10-CM

## 2013-12-02 DIAGNOSIS — F3289 Other specified depressive episodes: Secondary | ICD-10-CM

## 2013-12-02 DIAGNOSIS — F329 Major depressive disorder, single episode, unspecified: Secondary | ICD-10-CM

## 2013-12-02 MED ORDER — ONDANSETRON 4 MG PO TBDP
4.0000 mg | ORAL_TABLET | Freq: Once | ORAL | Status: AC
  Administered 2013-12-02: 4 mg via ORAL
  Filled 2013-12-02: qty 1

## 2013-12-02 MED ORDER — HYDROXYZINE HCL 25 MG PO TABS
25.0000 mg | ORAL_TABLET | Freq: Four times a day (QID) | ORAL | Status: DC | PRN
  Administered 2013-12-02 – 2013-12-03 (×2): 25 mg via ORAL
  Filled 2013-12-02 (×2): qty 1
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
## 2013-12-02 NOTE — ED Notes (Signed)
Report received from Tanika RN. Pt. Alert and oriented in no distress denies SI, HI, AVH and pain. Will continue to monitor for safety. Pt. Instructed to come to me with problems or concerns. Q 15 minute checks continue. 

## 2013-12-02 NOTE — ED Notes (Signed)
Patient rates anxiety 5/10 and states that he is feeling very low regarding feelings of depression. Denies SI, HI, AVH. Contracts for safety on the unit. Requests home Rx tonight for sleep.  Encouragement offered.   Q 15 safety checks continue.

## 2013-12-02 NOTE — ED Notes (Signed)
Patient appears to be resting quietly. Respirations even and unlabored. Ativan held at present.

## 2013-12-02 NOTE — ED Notes (Addendum)
Up to restroom. Denies complaint at present.

## 2013-12-03 MED ORDER — FLUOXETINE HCL 40 MG PO CAPS: 40.0000 mg | ORAL_CAPSULE | Freq: Every day | ORAL | Status: DC

## 2013-12-03 MED ORDER — TRAZODONE HCL 50 MG PO TABS: 50.0000 mg | ORAL_TABLET | Freq: Every day | ORAL | Status: DC

## 2013-12-03 MED ORDER — HYDROXYZINE HCL 25 MG PO TABS: 25.0000 mg | ORAL_TABLET | Freq: Four times a day (QID) | ORAL | Status: DC | PRN

## 2013-12-03 NOTE — ED Notes (Signed)
Friend in w/ pt 

## 2013-12-03 NOTE — ED Notes (Signed)
Up to the bathroom 

## 2013-12-03 NOTE — ED Notes (Signed)
Written dc instructions reviewed w/ pt.   Pt denies si/hi/avh on dc, encouraged to take medications as directed and follow up with OP resources provided.  Pt verbalized understanding.  Pt ambulatory to dc window w/ mHt.

## 2013-12-03 NOTE — [facility] Suicide Risk Assessment (Cosign Needed)
Suicide Risk Assessment  Discharge Assessment     Demographic Factors:  Caucasian and Low socioeconomic status  Total Time spent with patient: 30 minutes  Psychiatric Specialty Exam:     Blood pressure 115/89, pulse 97, temperature 98.7 F (37.1 C), temperature source Oral, resp. rate 18, SpO2 100.00%.There is no weight on file to calculate BMI.  General Appearance: Casual and Disheveled  Eye Contact::  Good  Speech:  Clear and Coherent and Normal Rate  Volume:  Normal  Mood:  Depressed  Affect:  Congruent and Depressed  Thought Process:  Coherent, Goal Directed and Intact  Orientation:  Full (Time, Place, and Person)  Thought Content:  WDL  Suicidal Thoughts:  No  Homicidal Thoughts:  No  Memory:  Immediate;   Good Recent;   Good Remote;   Good  Judgement:  Good  Insight:  Good  Psychomotor Activity:  Normal  Concentration:  Good  Recall:  NA  Fund of Knowledge:Good  Language: Good  Akathisia:  NA  Handed:  Right  AIMS (if indicated):     Assets:  Desire for Improvement  Sleep:       Musculoskeletal: Strength & Muscle Tone: within normal limits Gait & Station: normal Patient leans: N/A   Mental Status Per Nursing Assessment::   On Admission:     Current Mental Status by Physician: NA  Loss Factors: NA  Historical Factors: NA  Risk Reduction Factors:   Living with another person, especially a relative  Continued Clinical Symptoms:  Depression:   Insomnia  Cognitive Features That Contribute To Risk:  Polarized thinking    Suicide Risk:  Minimal: No identifiable suicidal ideation.  Patients presenting with no risk factors but with morbid ruminations; may be classified as minimal risk based on the severity of the depressive symptoms  Discharge Diagnoses:   AXIS I:  Depressive Disorder NOS and Alcohol dependency AXIS II:  Deferred AXIS III:   Past Medical History  Diagnosis Date  . Syncope   . Seizure disorder   . Alcoholism   . Depression    . Anxiety    AXIS IV:  other psychosocial or environmental problems and problems related to social environment AXIS V:  61-70 mild symptoms  Plan Of Care/Follow-up recommendations:  Activity:  As tolerated Diet:  regular  Is patient on multiple antipsychotic therapies at discharge:  No   Has Patient had three or more failed trials of antipsychotic monotherapy by history:  No  Recommended Plan for Multiple Antipsychotic Therapies: NA    Dahlia Byes, C   PMHNP-BC 12/03/2013, 5:12 PM

## 2013-12-03 NOTE — ED Notes (Signed)
Friend in w/ pt

## 2013-12-03 NOTE — ED Notes (Signed)
Dr arfeen and josephine NP into see 

## 2013-12-03 NOTE — ED Notes (Signed)
Dr Lolly Mustache aware of nausea, give ativan for nausea and other detox symptoms

## 2013-12-03 NOTE — ED Notes (Signed)
Friend into see 

## 2013-12-03 NOTE — Discharge Summary (Signed)
Physician Discharge Summary Note  Patient:  Thomas Atkins is an 38 y.o., male MRN:  [id number] DOB:  30-Apr-1975 Patient phone:  [phone] (home)  Patient address:   [place] [address],  Total Time spent with patient: 30 minutes  Date of Admission:  12/01/2013 Date of Discharge: 12/03/2013  Reason for Admission TO ER:  Seeking alcohol detox Treatment  Discharge Diagnoses: Principal Problem:   Alcohol dependence Active Problems:   Depressive disorder   Psychiatric Specialty Exam: Physical Exam  ROS  Blood pressure 115/89, pulse 97, temperature 98.7 F (37.1 C), temperature source Oral, resp. rate 18, SpO2 100.00%.There is no weight on file to calculate BMI.  General Appearance: Casual and Disheveled  Eye Contact::  Good  Speech:  Clear and Coherent and Normal Rate  Volume:  Normal  Mood:  Depressed  Affect:  Depressed  Thought Process:  Coherent, Goal Directed and Intact  Orientation:  Full (Time, Place, and Person)  Thought Content:  WDL  Suicidal Thoughts:  No  Homicidal Thoughts:  No  Memory:  Immediate;   Good Recent;   Good Remote;   Good  Judgement:  Good  Insight:  Good  Psychomotor Activity:  Normal  Concentration:  Good  Recall:  NA  Fund of Knowledge:Good  Language: Good  Akathisia:  NA  Handed:  Right  AIMS (if indicated):     Assets:  Desire for Improvement  Sleep:       Past Psychiatric History: Diagnosis:  Hospitalizations:  Outpatient Care:  Substance Abuse Care:  Self-Mutilation:  Suicidal Attempts:  Violent Behaviors:   Musculoskeletal: Strength & Muscle Tone: within normal limits Gait & Station: normal Patient leans: N/A  DSM5:  Schizophrenia Disorders: Substance/Addictive Disorders:  Alcohol Related Disorder - Severe (303.90) Depressive Disorders:  Major Depressive Disorder - Moderate (296.22)  Axis Diagnosis:   AXIS I:  Depressive Disorder NOS AXIS II:  Deferred AXIS III:   Past Medical History   Diagnosis Date  . Syncope   . Seizure disorder   . Alcoholism   . Depression   . Anxiety    AXIS IV:  other psychosocial or environmental problems and problems related to social environment AXIS V:  61-70 mild symptoms  Level of Care:  OP  Hospital Course:  Patient came to the ER seeking detox treatment from alcohol.  Patient states he drinks about 200 oz of beer daily..  On arrival to the ER two days ago he was having withdrawal symptoms, nausea, anxiety and his UDS was positive for marijuana.  Today he is feeling much better and is ready to go home.  Patient has mild tremors of his fingers, he denies nausea and will like to go home.  Patient live with his father and plans to continue follow up with his outpatient psychiatrist.  He denies SI/HI/AVH.  Patient will be discharged home now.  Discharge Vitals:   Blood pressure 115/89, pulse 97, temperature 98.7 F (37.1 C), temperature source Oral, resp. rate 18, SpO2 100.00%. There is no weight on file to calculate BMI. Lab Results:   Results for orders placed during the hospital encounter of 12/01/13 (from the past 72 hour(s))  CBC     Status: None   Collection Time    12/01/13  3:28 PM      Result Value Ref Range   WBC 6.2  4.0 - 10.5 K/uL   RBC 4.58  4.22 - 5.81 MIL/uL   Hemoglobin 15.2  13.0 - 17.0  g/dL   HCT 43.7  39.0 - 52.0 %   MCV 95.4  78.0 - 100.0 fL   MCH 33.2  26.0 - 34.0 pg   MCHC 34.8  30.0 - 36.0 g/dL   RDW 13.4  11.5 - 15.5 %   Platelets 200  150 - 400 K/uL  COMPREHENSIVE METABOLIC PANEL     Status: Abnormal   Collection Time    12/01/13  3:28 PM      Result Value Ref Range   Sodium 138  137 - 147 mEq/L   Potassium 4.3  3.7 - 5.3 mEq/L   Chloride 100  96 - 112 mEq/L   CO2 23  19 - 32 mEq/L   Glucose, Bld 89  70 - 99 mg/dL   BUN 11  6 - 23 mg/dL   Creatinine, Ser 0.93  0.50 - 1.35 mg/dL   Calcium 9.4  8.4 - 10.5 mg/dL   Total Protein 7.4  6.0 - 8.3 g/dL   Albumin 4.0  3.5 - 5.2 g/dL   AST 43 (*) 0 - 37 U/L    ALT 45  0 - 53 U/L   Alkaline Phosphatase 60  39 - 117 U/L   Total Bilirubin 0.7  0.3 - 1.2 mg/dL   GFR calc non Af Amer >90  >90 mL/min   GFR calc Af Amer >90  >90 mL/min   Comment: (NOTE)     The eGFR has been calculated using the CKD EPI equation.     This calculation has not been validated in all clinical situations.     eGFR's persistently <90 mL/min signify possible Chronic Kidney     Disease.   Anion gap 15  5 - 15  ETHANOL     Status: None   Collection Time    12/01/13  3:28 PM      Result Value Ref Range   Alcohol, Ethyl (B) <11  0 - 11 mg/dL   Comment:            LOWEST DETECTABLE LIMIT FOR     SERUM ALCOHOL IS 11 mg/dL     FOR MEDICAL PURPOSES ONLY  ACETAMINOPHEN LEVEL     Status: None   Collection Time    12/01/13  3:28 PM      Result Value Ref Range   Acetaminophen (Tylenol), Serum <15.0  10 - 30 ug/mL   Comment:            THERAPEUTIC CONCENTRATIONS VARY     SIGNIFICANTLY. A RANGE OF 10-30     ug/mL MAY BE AN EFFECTIVE     CONCENTRATION FOR MANY PATIENTS.     HOWEVER, SOME ARE BEST TREATED     AT CONCENTRATIONS OUTSIDE THIS     RANGE.     ACETAMINOPHEN CONCENTRATIONS     >150 ug/mL AT 4 HOURS AFTER     INGESTION AND >50 ug/mL AT 12     HOURS AFTER INGESTION ARE     OFTEN ASSOCIATED WITH TOXIC     REACTIONS.  SALICYLATE LEVEL     Status: Abnormal   Collection Time    12/01/13  3:28 PM      Result Value Ref Range   Salicylate Lvl <1.5 (*) 2.8 - 20.0 mg/dL  URINE RAPID DRUG SCREEN (HOSP PERFORMED)     Status: Abnormal   Collection Time    12/01/13  3:39 PM      Result Value Ref Range   Opiates NONE DETECTED  NONE DETECTED   Cocaine NONE DETECTED  NONE DETECTED   Benzodiazepines NONE DETECTED  NONE DETECTED   Amphetamines NONE DETECTED  NONE DETECTED   Tetrahydrocannabinol POSITIVE (*) NONE DETECTED   Barbiturates NONE DETECTED  NONE DETECTED   Comment:            DRUG SCREEN FOR MEDICAL PURPOSES     ONLY.  IF CONFIRMATION IS NEEDED     FOR ANY  PURPOSE, NOTIFY LAB     WITHIN 5 DAYS.                LOWEST DETECTABLE LIMITS     FOR URINE DRUG SCREEN     Drug Class       Cutoff (ng/mL)     Amphetamine      1000     Barbiturate      200     Benzodiazepine   696     Tricyclics       295     Opiates          300     Cocaine          300     THC              50    Physical Findings: AIMS:  , ,  ,  ,    CIWA:  CIWA-Ar Total: 5 COWS:     Psychiatric Specialty Exam: See Psychiatric Specialty Exam and Suicide Risk Assessment completed by Attending Physician prior to discharge.  Discharge destination:  Home  Is patient on multiple antipsychotic therapies at discharge:  No   Has Patient had three or more failed trials of antipsychotic monotherapy by history:  No  Recommended Plan for Multiple Antipsychotic Therapies: NA  Discharge Instructions   Diet - low sodium heart healthy    Complete by:  As directed      Discharge instructions    Complete by:  As directed   Follow up with outpatient provider for alcohol abuse and depression     Increase activity slowly    Complete by:  As directed             Medication List    STOP taking these medications       traZODone 50 MG tablet  Commonly known as:  DESYREL      TAKE these medications     Indication   FLUoxetine 40 MG capsule  Commonly known as:  PROZAC  Take 40 mg by mouth at bedtime.      hydrOXYzine 25 MG tablet  Commonly known as:  ATARAX/VISTARIL  Take 25 mg by mouth at bedtime as needed for anxiety. May take an additional tablet during the day if needed for aniexty          Follow-up recommendations:  Activity:  As tolerated Diet:  regular  Comments:  Patient was seen with Dr Adele Schilder who agrees that patient can be discharged home to follow up with his outpatient Psychiatrist.  Total Discharge Time:  Greater than 30 minutes.  SignedDelfin Gant   PMHNP-BC 12/03/2013, 5:19 PM  I have personally seen the patient and agreed with the  findings and involved in the treatment plan. Berniece Andreas, MD

## 2014-05-15 ENCOUNTER — Encounter (HOSPITAL_COMMUNITY): Payer: Self-pay | Admitting: Emergency Medicine

## 2014-05-15 ENCOUNTER — Emergency Department (HOSPITAL_COMMUNITY)
Admission: EM | Admit: 2014-05-15 | Discharge: 2014-05-15 | Disposition: A | Discharge status: Home / Self Care | Payer: Self-pay | Attending: Emergency Medicine | Admitting: Emergency Medicine

## 2014-05-15 DIAGNOSIS — Z720 Tobacco use: Secondary | ICD-10-CM | POA: Insufficient documentation

## 2014-05-15 DIAGNOSIS — F1010 Alcohol abuse, uncomplicated: Secondary | ICD-10-CM | POA: Insufficient documentation

## 2014-05-15 DIAGNOSIS — Z8669 Personal history of other diseases of the nervous system and sense organs: Secondary | ICD-10-CM | POA: Insufficient documentation

## 2014-05-15 DIAGNOSIS — F329 Major depressive disorder, single episode, unspecified: Secondary | ICD-10-CM | POA: Insufficient documentation

## 2014-05-15 DIAGNOSIS — F419 Anxiety disorder, unspecified: Secondary | ICD-10-CM | POA: Insufficient documentation

## 2014-05-15 NOTE — ED Provider Notes (Signed)
CSN: [id number]     Arrival date & time 05/15/14  1012 History   First MD Initiated Contact with Patient 05/15/14 1136     Chief Complaint  Patient presents with  . Medication Refill  . ETOH detox      (Consider location/radiation/quality/duration/timing/severity/associated sxs/prior Treatment) HPI   39 year old male with history of alcohol abuse, seizure disorder, depression anxiety presenting requesting for alcohol detox.  Patient admits that he has a history of alcohol abuse. He usually drinks 5-6 40 oz daily, last drink was today, one 40 ounce. Patient states he lost his book bag approximate 5 days ago and have not been taking his Prozac, Vistaril, and trazodone. He does have a list of refill but has not filled them out due to lack of money. However states that his father does give him money and he can get the medication refilled. He is here requesting for alcohol detox. He denies suicidal homicidal ideation, denies hallucination, but states, "I don't feel right in the head".  No recent seizure episode. Denies any physical pain. Admits to smoking marijuana yesterday. Denies any other street drugs. States he gets his medication from [facility]. No chest pain shortness of breath abdominal pain back pain dysuria numbness weakness or rash.  Past Medical History  Diagnosis Date  . Syncope   . Seizure disorder   . Alcoholism   . Depression   . Anxiety    Past Surgical History  Procedure Laterality Date  . Surgery scrotal / testicular     Family History  Problem Relation Age of Onset  . Heart disease Paternal Grandmother    History  Substance Use Topics  . Smoking status: Current Every Day Smoker -- 0.50 packs/day    Types: Cigarettes  . Smokeless tobacco: Not on file  . Alcohol Use: 7.2 oz/week    12 Cans of beer per week     Comment: 200oz/day     Review of Systems  All other systems reviewed and are negative.     Allergies  Lactose intolerance (gi) and Ultram  Home  Medications   Prior to Admission medications   Medication Sig Start Date End Date Taking? Authorizing Provider  FLUoxetine (PROZAC) 20 MG capsule Take 20 mg by mouth daily.   Yes Historical Provider, MD  hydrOXYzine (VISTARIL) 50 MG capsule Take 50 mg by mouth 2 (two) times daily as needed for anxiety.   Yes Historical Provider, MD  Ibuprofen-Diphenhydramine Cit (ADVIL PM PO) Take 2 tablets by mouth once.   Yes Historical Provider, MD  traZODone (DESYREL) 100 MG tablet Take 100 mg by mouth at bedtime.   Yes Historical Provider, MD  FLUoxetine (PROZAC) 40 MG capsule Take 1 capsule (40 mg total) by mouth at bedtime. Patient not taking: Reported on 05/15/2014 12/03/13   Earney NavyJosephine C Onuoha, NP  hydrOXYzine (ATARAX/VISTARIL) 25 MG tablet Take 1 tablet (25 mg total) by mouth every 6 (six) hours as needed for anxiety. Patient not taking: Reported on 05/15/2014 12/03/13   Earney NavyJosephine C Onuoha, NP  traZODone (DESYREL) 50 MG tablet Take 1-2 tablets (50-100 mg total) by mouth at bedtime. Patient not taking: Reported on 05/15/2014 12/03/13   Earney NavyJosephine C Onuoha, NP   BP 126/94 mmHg  Pulse 76  Temp(Src) 97.8 F (36.6 C) (Oral)  Resp 16  SpO2 94% Physical Exam  Constitutional: He is oriented to person, place, and time. He appears well-developed and well-nourished. No distress.  HENT:  Head: Atraumatic.  Eyes: Conjunctivae are normal.  Neck: Normal  range of motion. Neck supple.  Cardiovascular: Normal rate and regular rhythm.   Pulmonary/Chest: Effort normal and breath sounds normal.  Abdominal: Soft. There is no tenderness.  Neurological: He is alert and oriented to person, place, and time.  Skin: No rash noted.  Psychiatric: His mood appears anxious. His speech is rapid and/or pressured. He is agitated. Thought content is not paranoid. He expresses no homicidal and no suicidal ideation.    ED Course  Procedures (including critical care time)  11:45 AM Patient presents requesting for alcohol detox. Last  drink was today, low concern for withdrawal seizure. No SI or HI. Report not taking his medication for 5 days but can get a refill. I will consult with TTS to help provide patient with outpatient resources and further assessment. I do not think he is qualified for inpatient psychiatric admission at this time. He is medically cleared.  11:57 AM TTS has evaluated pt and offer outpt resources.  Pt has contact his mother to pick him up.    Labs Review Labs Reviewed - No data to display  Imaging Review No results found.   EKG Interpretation None      MDM   Final diagnoses:  Alcohol abuse    BP 126/94 mmHg  Pulse 76  Temp(Src) 97.8 F (36.6 C) (Oral)  Resp 16  SpO2 94%     Fayrene Helper, PA-C 05/15/14 1157  Doug Sou, MD 05/15/14 1742

## 2014-05-15 NOTE — Discharge Instructions (Signed)
Alcohol Use Disorder °Alcohol use disorder is a mental disorder. It is not a one-time incident of heavy drinking. Alcohol use disorder is the excessive and uncontrollable use of alcohol over time that leads to problems with functioning in one or more areas of daily living. People with this disorder risk harming themselves and others when they drink to excess. Alcohol use disorder also can cause other mental disorders, such as mood and anxiety disorders, and serious physical problems. People with alcohol use disorder often misuse other drugs.  °Alcohol use disorder is common and widespread. Some people with this disorder drink alcohol to cope with or escape from negative life events. Others drink to relieve chronic pain or symptoms of mental illness. People with a family history of alcohol use disorder are at higher risk of losing control and using alcohol to excess.  °SYMPTOMS  °Signs and symptoms of alcohol use disorder may include the following:  °· Consumption of alcohol in larger amounts or over a longer period of time than intended. °· Multiple unsuccessful attempts to cut down or control alcohol use.   °· A great deal of time spent obtaining alcohol, using alcohol, or recovering from the effects of alcohol (hangover). °· A strong desire or urge to use alcohol (cravings).   °· Continued use of alcohol despite problems at work, school, or home because of alcohol use.   °· Continued use of alcohol despite problems in relationships because of alcohol use. °· Continued use of alcohol in situations when it is physically hazardous, such as driving a car. °· Continued use of alcohol despite awareness of a physical or psychological problem that is likely related to alcohol use. Physical problems related to alcohol use can involve the brain, heart, liver, stomach, and intestines. Psychological problems related to alcohol use include intoxication, depression, anxiety, psychosis, delirium, and dementia.   °· The need for  increased amounts of alcohol to achieve the same desired effect, or a decreased effect from the consumption of the same amount of alcohol (tolerance). °· Withdrawal symptoms upon reducing or stopping alcohol use, or alcohol use to reduce or avoid withdrawal symptoms. Withdrawal symptoms include: °¨ Racing heart. °¨ Hand tremor. °¨ Difficulty sleeping. °¨ Nausea. °¨ Vomiting. °¨ Hallucinations. °¨ Restlessness. °¨ Seizures. °DIAGNOSIS °Alcohol use disorder is diagnosed through an assessment by your health care provider. Your health care provider may start by asking three or four questions to screen for excessive or problematic alcohol use. To confirm a diagnosis of alcohol use disorder, at least two symptoms must be present within a 12-month period. The severity of alcohol use disorder depends on the number of symptoms: °· Mild--two or three. °· Moderate--four or five. °· Severe--six or more. °Your health care provider may perform a physical exam or use results from lab tests to see if you have physical problems resulting from alcohol use. Your health care provider may refer you to a mental health professional for evaluation. °TREATMENT  °Some people with alcohol use disorder are able to reduce their alcohol use to low-risk levels. Some people with alcohol use disorder need to quit drinking alcohol. When necessary, mental health professionals with specialized training in substance use treatment can help. Your health care provider can help you decide how severe your alcohol use disorder is and what type of treatment you need. The following forms of treatment are available:  °· Detoxification. Detoxification involves the use of prescription medicines to prevent alcohol withdrawal symptoms in the first week after quitting. This is important for people with a history of symptoms   of withdrawal and for heavy drinkers who are likely to have withdrawal symptoms. Alcohol withdrawal can be dangerous and, in severe cases, cause  death. Detoxification is usually provided in a hospital or in-patient substance use treatment facility. °· Counseling or talk therapy. Talk therapy is provided by substance use treatment counselors. It addresses the reasons people use alcohol and ways to keep them from drinking again. The goals of talk therapy are to help people with alcohol use disorder find healthy activities and ways to cope with life stress, to identify and avoid triggers for alcohol use, and to handle cravings, which can cause relapse. °· Medicines. Different medicines can help treat alcohol use disorder through the following actions: °¨ Decrease alcohol cravings. °¨ Decrease the positive reward response felt from alcohol use. °¨ Produce an uncomfortable physical reaction when alcohol is used (aversion therapy). °· Support groups. Support groups are run by people who have quit drinking. They provide emotional support, advice, and guidance. °These forms of treatment are often combined. Some people with alcohol use disorder benefit from intensive combination treatment provided by specialized substance use treatment centers. Both inpatient and outpatient treatment programs are available. °Document Released: 04/03/2004 Document Revised: 07/11/2013 Document Reviewed: 06/03/2012 °ExitCare® Patient Information ©2015 [employer]. This information is not intended to replace advice given to you by your health care provider. Make sure you discuss any questions you have with your health care provider. ° ° ° ° °Emergency Department Resource Guide °1) Find a Doctor and Pay Out of Pocket °Although you won't have to find out who is covered by your insurance plan, it is a good idea to ask around and get recommendations. You will then need to call the office and see if the doctor you have chosen will accept you as a new patient and what types of options they offer for patients who are self-pay. Some doctors offer discounts or will set up payment plans for  their patients who do not have insurance, but you will need to ask so you aren't surprised when you get to your appointment. ° °2) Contact Your Local Health Department °Not all health departments have doctors that can see patients for sick visits, but many do, so it is worth a call to see if yours does. If you don't know where your local health department is, you can check in your phone book. The CDC also has a tool to help you locate your state's health department, and many state websites also have listings of all of their local health departments. ° °3) Find a Walk-in Clinic °If your illness is not likely to be very severe or complicated, you may want to try a walk in clinic. These are popping up all over the country in pharmacies, drugstores, and shopping centers. They're usually staffed by nurse practitioners or physician assistants that have been trained to treat common illnesses and complaints. They're usually fairly quick and inexpensive. However, if you have serious medical issues or chronic medical problems, these are probably not your best option. ° °No Primary Care Doctor: °- Call [facility] at  832-8000 - they can help you locate a primary care doctor that  accepts your insurance, provides certain services, etc. °- Physician Referral Service- 1-800-533-3463 ° °Chronic Pain Problems: °Organization         Address  Phone   Notes  °[facility]  (336) 297-2271 Patients need to be referred by their primary care doctor.  ° °Medication Assistance: °Organization           Address  Phone   Notes  °[place] [facility] [address] °[place], [place] [address] (336) 641-8030 --Must be a resident of [place] °-- Must have NO insurance coverage whatsoever (no Medicaid/ Medicare, etc.) °-- The pt. MUST have a primary care doctor that directs their care regularly and follows them in the community °  °[facility]  (866) 331-1348   °[employer]  (888)  892-1162   ° °Agencies that provide inexpensive medical care: °Organization         Address  Phone   Notes  °[facility]  (336) 832-8035   °[facility]    (336) 832-7272   °[facility] [address] °[place], [place] [address] (336) 832-4777   °[facility] [address], °[place] (336) 271-4999   °[facility]    (336) 373-0678   °[facility]    (336) 272-1050   °[facility] ° [address], [place] Phone:  (336) 832-4444, Fax:  (336) 832-4440 Hours of Operation:  9 am - 6 pm, M-F.  Also accepts Medicaid/Medicare and self-pay.  °[facility] ° [address], [place] Phone: (336) 832-3150, Fax: (336) 832-3151. Hours of Operation:  8:30 am - 5:30 pm, M-F.  Also accepts Medicaid and self-pay.  °[facility] [place] [address], [place] Phone: (336) 878-6027   °[facility] [address], [place], [place] (336)723-1848, Ext. 123 Mondays & Thursdays: 7-9 AM.  First 15 patients are seen on a first come, first serve basis. °  ° °Medicaid-accepting [place] Providers: ° °Organization         Address  Phone   Notes  °[facility] [address], [place] (336) 641-2100 Also accepts self-pay patients.  °[facility] [address], [place] ° (336) 856-9996   °[facility] [address], [place] (336) 288-8857   °[facility] [address] [place] [address], [place] (336) 299-7000   °Veita Bland [address], [place]  ° (336) 373-1557 Only accepts  Access Medicaid patients after they have their name applied to their card.  ° °Self-Pay (no insurance) in [place]: ° °Organization         Address  Phone   Notes  °Sickle Cell Patients, [facility] [address], [place] (336)  832-1970   °[facility] [address], [place] (336) 832-4400   °[facility] [place] ° [address], [place] (336) 992-4800   °[facility]/Dr. Osei-Bonsu ° [address] [place] [address], [place] or [address], [place] (336) 841-8500 Phone number for both [place] and [place] locations is the same.  °[facility] [address], [place] (336) 299-0000   °[facility] [address] [place] [address], [place] or [address] (336) 852-7530 °(336) 878-2260   °[facility] [address],  (336) 350-1642, phone; (336) 294-5005, fax Sees patients 1st and 3rd Saturday of every month.  Must not qualify for public or private insurance (i.e. Medicaid, Medicare, [place] Health Choice, Veterans' Benefits) • Household income should be no more than 200% of the poverty level •The clinic cannot treat you if you are pregnant or think you   are pregnant • Sexually transmitted diseases are not treated at the clinic.  ° ° °Dental Care: °Organization         Address  Phone  Notes  °[facility] [facility] [address], [place] (336) 641-6152 Accepts children up to age 21 who are enrolled in Medicaid or [place] Health Choice; pregnant women with a Medicaid card; and children who have applied for Medicaid or [place] Health Choice, but were declined, whose parents can pay a reduced fee at time of service.  °[facility] [place]  [address], [place] (336) 641-7733 Accepts children up to age 21 who are enrolled in Medicaid or [place] Health Choice; pregnant women with a Medicaid card; and children who have applied for Medicaid or [place] Health Choice, but were declined, whose parents can pay a reduced fee at time of service.  °[facility] ° [address], [place] (336) 641-4533 Patients are  seen by appointment only. Walk-ins are not accepted. [facility] will see patients 18 years of age and older. °Monday - Tuesday (8am-5pm) °Most Wednesdays (8:30-5pm) °$30 per visit, cash only  °[facility] ° [address], [place] (336) 641-4533 Patients are seen by appointment only. Walk-ins are not accepted. [facility] will see patients 18 years of age and older. °One Wednesday Evening (Monthly: Volunteer Based).  $30 per visit, cash only  °[facility]  (919) 537-3737 for adults; Children under age 4, call Graduate Pediatric Dentistry at (919) [phone]. Children aged 4-14, please call (919) [phone] to request a pediatric application. ° Dental services are provided in all areas of dental care including fillings, crowns and bridges, complete and partial dentures, implants, gum treatment, root canals, and extractions. Preventive care is also provided. Treatment is provided to both adults and children. °Patients are selected via a lottery and there is often a waiting list. °  °[facility] [address], °[place] ° (336) 763-8833 [email or web address] °  °[facility] [address], [place], [place] (336)723-1848, Ext. 123 Second and Fourth Thursday of each month, opens at 6:30 AM; Clinic ends at 9 AM.  Patients are seen on a first-come first-served basis, and a limited number are seen during each clinic.  ° °[facility] ° [address], [place], [place] (336) 723-7904   Eligibility Requirements °You must have lived in [place], [place], or [place] counties for at least the last three months. °  You cannot be eligible for state or federal sponsored healthcare insurance, including Veterans Administration, Medicaid, or Medicare. °  You generally cannot be eligible for healthcare insurance through your employer.  °  How to apply: °Eligibility screenings are held every Tuesday and Wednesday afternoon from 1:00 pm until 4:00  pm. You do not need an appointment for the interview!  °[facility] [address], [place], [place] 336-631-2330   °[facility]  336-342-8273   °[facility]  336-703-3100   °[facility]  336-570-6415   ° °Behavioral Health Resources in the Community: °Intensive Outpatient Programs °Organization         Address  Phone  Notes  °[facility] [address], [place], [place] 336-878-6098   °[facility] [address], [place], [place] 336-832-9800   °[facility] [address], [place], [place] ° 336-882-2125   °[facility] [address],  °[place], [place] 1-800-853-5163 or 336-641-4981   °Substance Abuse Resources °Organization         Address  Phone  Notes  °[facility]  336-882-2125   °[facility]  336-784-9470   °[facility]  336-285-9073   °[facility]  336-845-3988   °Residential & Outpatient Substance Abuse Program  1-800-659-3381   °Psychological Services °Organization         Address  Phone  Notes  °[facility]  336- 832-9600   °[facility]  336- 378-7881   °[facility] [address], [place] 1-800-853-5163 or 336-641-4981   ° °Mobile Crisis Teams °Organization         Address  Phone  Notes  °[facility], Mobile Crisis Care Unit  1-877-626-1772   °Assertive °[facility] ° [address] [place], [place] 336-834-9664   °Sharon DeEsch [address] °[place] 336-554-5454   ° °Self-Help/Support Groups °Organization         Address  Phone             Notes  °[facility] - variety of support groups  336- 373-1402 Call for more information  °Narcotics Anonymous (NA), [facility] [address], °[place]  2 meetings at this location  ° °Residential Treatment Programs °Organization          Address  Phone  Notes  °[facility] [address],    °[place]  1-866-801-8205   °[facility] ° [address], [place], [place] 704-293-8524   °[facility] [address], [place] 336-845-3988 Admissions: 8am-3pm M-F  °[facility] [address]    °[place], [place] 336-841-1104   °[facility] [address], [place], [place] 336-379-7146   °[facility] [address]  °[place], [place] 336-285-9073   °[facility] - Intensive Outpatient [address], [place], [place] 336-852-3033   °[facility] [address]  °[place], [place] 1-877-615-2722 or 336-784-9470   °[facility] [address] , [place] 336-227-7417 Accepts Medicaid  °[facility] [address]  ° [place] 1-800-659-3381 Substance Abuse/Addiction Treatment  ° °[place] Behavioral Health Resources °Organization         Address  Phone  Notes  °[facility]  (888) 581-9988   °Julie Brannon, PhD [address] [place], [place]   (336) 349-5553 or (336) 951-0000   °[facility]   [address] °[place], [place] (336) 349-4454   °[facility] [address], [place], [place] (336) 342-8316 Insurance/Medicaid/sponsorship through [facility]  °[facility] [address]                                    [place], [place] (336) 342-8316 Therapy/tele-psych/case  °[facility] [address]  ° [place], [place] (336) 349-2233    °Dr. Arfeen  (336) 349-4544   °[facility] [place]  [employer] [place] [facility] 1) [address], [place] °2) [address], [place] °3)  [address], [place] (336) 349-3220 °(336) 342-7768 ° °(336) 342-8140   °[place] [facility] (336)   342-1394 or (336) 342-3537 (After Hours)    ° ° ° °

## 2014-05-15 NOTE — ED Notes (Signed)
Informed Paige, TTS, of assessment/resource need

## 2014-05-15 NOTE — ED Notes (Signed)
Per pt, lost boo bag with meds in it-off meds since Friday-states drinks daily last drink was this am-no seizures while detoxing-several years since he has been sober

## 2015-05-08 ENCOUNTER — Emergency Department (HOSPITAL_COMMUNITY): Payer: Self-pay

## 2015-05-08 ENCOUNTER — Encounter (HOSPITAL_COMMUNITY): Payer: Self-pay | Admitting: Emergency Medicine

## 2015-05-08 ENCOUNTER — Emergency Department (HOSPITAL_COMMUNITY)
Admission: EM | Admit: 2015-05-08 | Discharge: 2015-05-08 | Discharge status: Against Medical Advice | Payer: Self-pay | Attending: Emergency Medicine | Admitting: Emergency Medicine

## 2015-05-08 DIAGNOSIS — F17210 Nicotine dependence, cigarettes, uncomplicated: Secondary | ICD-10-CM | POA: Insufficient documentation

## 2015-05-08 DIAGNOSIS — R079 Chest pain, unspecified: Secondary | ICD-10-CM | POA: Insufficient documentation

## 2015-05-08 DIAGNOSIS — F329 Major depressive disorder, single episode, unspecified: Secondary | ICD-10-CM | POA: Insufficient documentation

## 2015-05-08 DIAGNOSIS — R059 Cough, unspecified: Secondary | ICD-10-CM

## 2015-05-08 DIAGNOSIS — F10120 Alcohol abuse with intoxication, uncomplicated: Secondary | ICD-10-CM | POA: Insufficient documentation

## 2015-05-08 DIAGNOSIS — F419 Anxiety disorder, unspecified: Secondary | ICD-10-CM | POA: Insufficient documentation

## 2015-05-08 DIAGNOSIS — R05 Cough: Secondary | ICD-10-CM | POA: Insufficient documentation

## 2015-05-08 LAB — I-STAT TROPONIN, ED: Troponin i, poc: 0 ng/mL (ref 0.00–0.08)

## 2015-05-08 LAB — BASIC METABOLIC PANEL
ANION GAP: 12 (ref 5–15)
BUN: 11 mg/dL (ref 6–20)
CO2: 25 mmol/L (ref 22–32)
Calcium: 9.3 mg/dL (ref 8.9–10.3)
Chloride: 108 mmol/L (ref 101–111)
Creatinine, Ser: 0.92 mg/dL (ref 0.61–1.24)
GFR calc Af Amer: >60 mL/min (ref >60)
Glucose, Bld: 103 mg/dL — ABNORMAL HIGH (ref 65–99)
POTASSIUM: 4.3 mmol/L (ref 3.5–5.1)
Sodium: 145 mmol/L (ref 135–145)

## 2015-05-08 LAB — CBC
HCT: 43.2 % (ref 39.0–52.0)
HEMOGLOBIN: 15.4 g/dL (ref 13.0–17.0)
MCH: 34.7 pg — ABNORMAL HIGH (ref 26.0–34.0)
MCHC: 35.6 g/dL (ref 30.0–36.0)
MCV: 97.3 fL (ref 78.0–100.0)
Platelets: 267 10*3/uL (ref 150–400)
RBC: 4.44 MIL/uL (ref 4.22–5.81)
RDW: 13.9 % (ref 11.5–15.5)
WBC: 6.9 10*3/uL (ref 4.0–10.5)

## 2015-05-08 MED ORDER — IPRATROPIUM-ALBUTEROL 0.5-2.5 (3) MG/3ML IN SOLN
3.0000 mL | Freq: Once | RESPIRATORY_TRACT | Status: DC
  Filled 2015-05-08: qty 3

## 2015-05-08 MED ORDER — ALBUTEROL SULFATE HFA 108 (90 BASE) MCG/ACT IN AERS
2.0000 | INHALATION_SPRAY | RESPIRATORY_TRACT | Status: DC | PRN
  Filled 2015-05-08: qty 6.7

## 2015-05-08 NOTE — ED Notes (Signed)
EKG stated Acute MI, alerted MD who looked over it, he stated that he did not believe it was an acute MI.

## 2015-05-08 NOTE — ED Provider Notes (Signed)
CSN: [id number]     Arrival date & time 05/08/15  1447 History   First MD Initiated Contact with Patient 05/08/15 1946     Chief Complaint  Patient presents with  . Chest Pain  . Cough  . Alcohol Intoxication     (Consider location/radiation/quality/duration/timing/severity/associated sxs/prior Treatment) HPI Comments: Patient presents to the emergency department with chief complaint of right posterior rib pain.  Patient states that he has been having a cough for the past several days. Also reports having a fever and some chills. He denies any shortness of breath or difficulty breathing. States that he feels the pain when he coughs.denies any exertional symptoms. The pain does not radiate.  The history is provided by the patient. No language interpreter was used.    Past Medical History  Diagnosis Date  . Syncope   . Seizure disorder (HCC)   . Alcoholism (HCC)   . Depression   . Anxiety    Past Surgical History  Procedure Laterality Date  . Surgery scrotal / testicular     Family History  Problem Relation Age of Onset  . Heart disease Paternal Grandmother    Social History  Substance Use Topics  . Smoking status: Current Every Day Smoker -- 0.50 packs/day    Types: Cigarettes  . Smokeless tobacco: None  . Alcohol Use: 7.2 oz/week    12 Cans of beer per week     Comment: 200oz/day     Review of Systems  All other systems reviewed and are negative.     Allergies  Lactose intolerance (gi) and Ultram  Home Medications   Prior to Admission medications   Medication Sig Start Date End Date Taking? Authorizing Provider  FLUoxetine (PROZAC) 20 MG capsule Take 40 mg by mouth at bedtime.    Yes Historical Provider, MD  traZODone (DESYREL) 100 MG tablet Take 100 mg by mouth at bedtime.   Yes Historical Provider, MD  FLUoxetine (PROZAC) 40 MG capsule Take 1 capsule (40 mg total) by mouth at bedtime. Patient not taking: Reported on 05/15/2014 12/03/13   Earney Navy,  NP  hydrOXYzine (ATARAX/VISTARIL) 25 MG tablet Take 1 tablet (25 mg total) by mouth every 6 (six) hours as needed for anxiety. Patient not taking: Reported on 05/15/2014 12/03/13   Earney Navy, NP  traZODone (DESYREL) 50 MG tablet Take 1-2 tablets (50-100 mg total) by mouth at bedtime. Patient not taking: Reported on 05/15/2014 12/03/13   Earney Navy, NP   BP 123/97 mmHg  Pulse 72  Temp(Src) 97.6 F (36.4 C) (Oral)  Resp 16  SpO2 97% Physical Exam  Constitutional: He is oriented to person, place, and time. He appears well-developed and well-nourished.  HENT:  Head: Normocephalic and atraumatic.  Eyes: Conjunctivae and EOM are normal. Pupils are equal, round, and reactive to light. Right eye exhibits no discharge. Left eye exhibits no discharge. No scleral icterus.  Neck: Normal range of motion. Neck supple. No JVD present.  Cardiovascular: Normal rate, regular rhythm and normal heart sounds.  Exam reveals no gallop and no friction rub.   No murmur heard. Pulmonary/Chest: Effort normal. No respiratory distress. He has wheezes. He has no rales. He exhibits no tenderness.  Mild left sided wheeze  Abdominal: Soft. He exhibits no distension and no mass. There is no tenderness. There is no rebound and no guarding.  Musculoskeletal: Normal range of motion. He exhibits no edema or tenderness.  Neurological: He is alert and oriented to person, place, and  time.  Skin: Skin is warm and dry.  Psychiatric: He has a normal mood and affect. His behavior is normal. Judgment and thought content normal.  Nursing note and vitals reviewed.   ED Course  Procedures (including critical care time) Labs Review Labs Reviewed  BASIC METABOLIC PANEL - Abnormal; Notable for the following:    Glucose, Bld 103 (*)    All other components within normal limits  CBC - Abnormal; Notable for the following:    MCH 34.7 (*)    All other components within normal limits  I-STAT TROPOININ, ED    Imaging  Review Dg Chest 2 View  05/08/2015  CLINICAL DATA:  Cough, chest pain, shortness of breath EXAM: CHEST  2 VIEW COMPARISON:  01/23/2013 FINDINGS: The heart size and mediastinal contours are within normal limits. Both lungs are clear. The visualized skeletal structures are unremarkable. IMPRESSION: No active cardiopulmonary disease. Electronically Signed   By: Elige Ko   On: 05/08/2015 15:33   I have personally reviewed and evaluated these images and lab results as part of my medical decision-making.   EKG Interpretation None      MDM   Final diagnoses:  Chest pain, unspecified chest pain type  Cough    Patient with chest pain cough. The pain is not central.  It is pain over the right posterior ribs.  The pain is worsened with palpation and when the patient coughs.  He also has some wheezing.  Will give breathing treatment.  Afebrile.  Troponin is negative. CXR is clear.  Patient eloped, was never able to reassess the patient. He never received his breathing treatment.    Roxy Horseman, PA-C 05/08/15 2126  Arby Barrette, MD 05/09/15 [phone]

## 2015-05-08 NOTE — ED Notes (Signed)
Pt upset with the wait and walked out of Room 2; RN was on the way to administer medications; pt left AMA without signing

## 2015-05-08 NOTE — ED Notes (Signed)
Unable to collect labs called patient name no one reponded

## 2015-05-08 NOTE — ED Notes (Addendum)
Per EMS, pt having trouble breathing with detox from alcohol, per EMS has had 4-5 40 oz beer to drink today.  Pt has a congested non-productive cough with inspiratory wheezing bilaterally. States he came in today for chest pain and cough, also says he is currently intoxicated.

## 2015-05-29 ENCOUNTER — Encounter (HOSPITAL_COMMUNITY): Payer: Self-pay | Admitting: Emergency Medicine

## 2015-05-29 ENCOUNTER — Emergency Department (HOSPITAL_COMMUNITY)
Admission: EM | Admit: 2015-05-29 | Discharge: 2015-05-30 | Disposition: A | Discharge status: Home / Self Care | Payer: Federal, State, Local not specified - Other | Attending: Emergency Medicine | Admitting: Emergency Medicine

## 2015-05-29 DIAGNOSIS — F329 Major depressive disorder, single episode, unspecified: Secondary | ICD-10-CM | POA: Insufficient documentation

## 2015-05-29 DIAGNOSIS — F32A Depression, unspecified: Secondary | ICD-10-CM | POA: Diagnosis present

## 2015-05-29 DIAGNOSIS — Z79899 Other long term (current) drug therapy: Secondary | ICD-10-CM | POA: Insufficient documentation

## 2015-05-29 DIAGNOSIS — F1024 Alcohol dependence with alcohol-induced mood disorder: Secondary | ICD-10-CM | POA: Insufficient documentation

## 2015-05-29 DIAGNOSIS — F1020 Alcohol dependence, uncomplicated: Secondary | ICD-10-CM | POA: Diagnosis present

## 2015-05-29 DIAGNOSIS — F1094 Alcohol use, unspecified with alcohol-induced mood disorder: Secondary | ICD-10-CM

## 2015-05-29 DIAGNOSIS — Z8669 Personal history of other diseases of the nervous system and sense organs: Secondary | ICD-10-CM | POA: Insufficient documentation

## 2015-05-29 DIAGNOSIS — F419 Anxiety disorder, unspecified: Secondary | ICD-10-CM | POA: Insufficient documentation

## 2015-05-29 DIAGNOSIS — F17210 Nicotine dependence, cigarettes, uncomplicated: Secondary | ICD-10-CM | POA: Insufficient documentation

## 2015-05-29 LAB — COMPREHENSIVE METABOLIC PANEL
ALK PHOS: 67 U/L (ref 38–126)
ALT: 78 U/L — ABNORMAL HIGH (ref 17–63)
AST: 85 U/L — ABNORMAL HIGH (ref 15–41)
Albumin: 4.9 g/dL (ref 3.5–5.0)
Anion gap: 13 (ref 5–15)
BUN: 13 mg/dL (ref 6–20)
CO2: 23 mmol/L (ref 22–32)
CREATININE: 0.89 mg/dL (ref 0.61–1.24)
Calcium: 9.4 mg/dL (ref 8.9–10.3)
Chloride: 105 mmol/L (ref 101–111)
GFR calc non Af Amer: >60 mL/min (ref >60)
Glucose, Bld: 91 mg/dL (ref 65–99)
Potassium: 4.4 mmol/L (ref 3.5–5.1)
SODIUM: 141 mmol/L (ref 135–145)
Total Bilirubin: 0.4 mg/dL (ref 0.3–1.2)
Total Protein: 8.2 g/dL — ABNORMAL HIGH (ref 6.5–8.1)

## 2015-05-29 LAB — CBC
HEMATOCRIT: 42.9 % (ref 39.0–52.0)
HEMOGLOBIN: 15.5 g/dL (ref 13.0–17.0)
MCH: 33.8 pg (ref 26.0–34.0)
MCHC: 36.1 g/dL — ABNORMAL HIGH (ref 30.0–36.0)
MCV: 93.7 fL (ref 78.0–100.0)
Platelets: 253 10*3/uL (ref 150–400)
RBC: 4.58 MIL/uL (ref 4.22–5.81)
RDW: 13.8 % (ref 11.5–15.5)
WBC: 7.5 10*3/uL (ref 4.0–10.5)

## 2015-05-29 LAB — SALICYLATE LEVEL: Salicylate Lvl: <4 mg/dL (ref 2.8–30.0)

## 2015-05-29 LAB — ETHANOL: Alcohol, Ethyl (B): 350 mg/dL (ref <5)

## 2015-05-29 LAB — ACETAMINOPHEN LEVEL: Acetaminophen (Tylenol), Serum: <10 ug/mL — ABNORMAL LOW (ref 10–30)
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
## 2015-05-29 NOTE — ED Notes (Signed)
Staffing notified of sitter need 

## 2015-05-29 NOTE — ED Notes (Signed)
Pt has in belonging bag:  Blue and white baseball cap, white t-shirt, brown jeans, brown belt, grey light jacket, white long sleeve shirt, white and purple shoes, black underwear, grey socks, black phone, white lighter, cigs, black sunglasses, black wallet ( ID card, one five dollar bill, one dollar bill). One pair of silver colored earrings.

## 2015-05-30 ENCOUNTER — Encounter (HOSPITAL_COMMUNITY): Payer: Self-pay | Admitting: General Practice

## 2015-05-30 ENCOUNTER — Inpatient Hospital Stay (HOSPITAL_COMMUNITY)
Admission: AD | Admit: 2015-05-30 | Discharge: 2015-06-04 | DRG: 885 | Disposition: A | Discharge status: Home / Self Care | Payer: Federal, State, Local not specified - Other | Source: Intra-hospital | Attending: Psychiatry | Admitting: Psychiatry

## 2015-05-30 DIAGNOSIS — F1014 Alcohol abuse with alcohol-induced mood disorder: Secondary | ICD-10-CM | POA: Diagnosis present

## 2015-05-30 DIAGNOSIS — F1020 Alcohol dependence, uncomplicated: Secondary | ICD-10-CM | POA: Diagnosis present

## 2015-05-30 DIAGNOSIS — F332 Major depressive disorder, recurrent severe without psychotic features: Principal | ICD-10-CM | POA: Diagnosis present

## 2015-05-30 DIAGNOSIS — F32A Depression, unspecified: Secondary | ICD-10-CM | POA: Diagnosis present

## 2015-05-30 DIAGNOSIS — Y908 Blood alcohol level of 240 mg/100 ml or more: Secondary | ICD-10-CM | POA: Diagnosis present

## 2015-05-30 DIAGNOSIS — F3289 Other specified depressive episodes: Secondary | ICD-10-CM | POA: Diagnosis not present

## 2015-05-30 DIAGNOSIS — F1024 Alcohol dependence with alcohol-induced mood disorder: Secondary | ICD-10-CM | POA: Diagnosis not present

## 2015-05-30 DIAGNOSIS — F329 Major depressive disorder, single episode, unspecified: Secondary | ICD-10-CM | POA: Diagnosis present

## 2015-05-30 DIAGNOSIS — F4310 Post-traumatic stress disorder, unspecified: Secondary | ICD-10-CM | POA: Diagnosis present

## 2015-05-30 DIAGNOSIS — F17210 Nicotine dependence, cigarettes, uncomplicated: Secondary | ICD-10-CM | POA: Diagnosis present

## 2015-05-30 LAB — RAPID URINE DRUG SCREEN, HOSP PERFORMED
AMPHETAMINES: NOT DETECTED
Barbiturates: NOT DETECTED
Benzodiazepines: NOT DETECTED
Cocaine: NOT DETECTED
OPIATES: NOT DETECTED
Tetrahydrocannabinol: NOT DETECTED

## 2015-05-30 MED ORDER — FLUOXETINE HCL 20 MG PO CAPS
40.0000 mg | ORAL_CAPSULE | Freq: Every day | ORAL | Status: DC
  Administered 2015-05-30: 40 mg via ORAL
  Filled 2015-05-30: qty 2

## 2015-05-30 MED ORDER — LORAZEPAM 1 MG PO TABS
0.0000 mg | ORAL_TABLET | Freq: Four times a day (QID) | ORAL | Status: DC
  Administered 2015-05-30 (×2): 2 mg via ORAL
  Filled 2015-05-30 (×2): qty 2

## 2015-05-30 MED ORDER — TRAZODONE HCL 100 MG PO TABS: 100.0000 mg | ORAL_TABLET | Freq: Every day | ORAL | Status: DC

## 2015-05-30 MED ORDER — LORAZEPAM 1 MG PO TABS
1.0000 mg | ORAL_TABLET | Freq: Two times a day (BID) | ORAL | Status: AC
  Administered 2015-06-02 (×2): 1 mg via ORAL
  Filled 2015-05-30 (×2): qty 1

## 2015-05-30 MED ORDER — ONDANSETRON 4 MG PO TBDP
4.0000 mg | ORAL_TABLET | Freq: Once | ORAL | Status: AC
  Administered 2015-05-30: 4 mg via ORAL
  Filled 2015-05-30: qty 1

## 2015-05-30 MED ORDER — NICOTINE 21 MG/24HR TD PT24
21.0000 mg | MEDICATED_PATCH | Freq: Every day | TRANSDERMAL | Status: DC
  Administered 2015-05-31 – 2015-06-04 (×5): 21 mg via TRANSDERMAL
  Filled 2015-05-30 (×7): qty 1

## 2015-05-30 MED ORDER — LORAZEPAM 1 MG PO TABS: 1.0000 mg | ORAL_TABLET | Freq: Four times a day (QID) | ORAL | Status: AC | PRN

## 2015-05-30 MED ORDER — VITAMIN B-1 100 MG PO TABS
100.0000 mg | ORAL_TABLET | Freq: Every day | ORAL | Status: DC
  Administered 2015-05-31 – 2015-06-04 (×5): 100 mg via ORAL
  Filled 2015-05-30 (×7): qty 1

## 2015-05-30 MED ORDER — LORAZEPAM 1 MG PO TABS
1.0000 mg | ORAL_TABLET | Freq: Every day | ORAL | Status: AC
  Administered 2015-06-03: 1 mg via ORAL
  Filled 2015-05-30: qty 1

## 2015-05-30 MED ORDER — HYDROXYZINE HCL 25 MG PO TABS: 25.0000 mg | ORAL_TABLET | Freq: Four times a day (QID) | ORAL | Status: AC | PRN

## 2015-05-30 MED ORDER — ACETAMINOPHEN 325 MG PO TABS: 650.0000 mg | ORAL_TABLET | Freq: Four times a day (QID) | ORAL | Status: DC | PRN

## 2015-05-30 MED ORDER — ALUM & MAG HYDROXIDE-SIMETH 200-200-20 MG/5ML PO SUSP: 30.0000 mL | ORAL | Status: DC | PRN

## 2015-05-30 MED ORDER — ONDANSETRON 4 MG PO TBDP
4.0000 mg | ORAL_TABLET | Freq: Four times a day (QID) | ORAL | Status: AC | PRN
  Administered 2015-05-31: 4 mg via ORAL
  Filled 2015-05-30: qty 1

## 2015-05-30 MED ORDER — MAGNESIUM HYDROXIDE 400 MG/5ML PO SUSP: 30.0000 mL | Freq: Every day | ORAL | Status: DC | PRN

## 2015-05-30 MED ORDER — TRAZODONE HCL 100 MG PO TABS
100.0000 mg | ORAL_TABLET | Freq: Every evening | ORAL | Status: DC | PRN
  Administered 2015-05-30 – 2015-06-03 (×5): 100 mg via ORAL
  Filled 2015-05-30 (×15): qty 1

## 2015-05-30 MED ORDER — LORAZEPAM 1 MG PO TABS
1.0000 mg | ORAL_TABLET | Freq: Three times a day (TID) | ORAL | Status: AC
  Administered 2015-06-01 (×3): 1 mg via ORAL
  Filled 2015-05-30 (×2): qty 1

## 2015-05-30 MED ORDER — LORAZEPAM 1 MG PO TABS
2.0000 mg | ORAL_TABLET | Freq: Once | ORAL | Status: AC
  Administered 2015-05-30: 2 mg via ORAL
  Filled 2015-05-30: qty 2

## 2015-05-30 MED ORDER — LOPERAMIDE HCL 2 MG PO CAPS: 2.0000 mg | ORAL_CAPSULE | ORAL | Status: AC | PRN

## 2015-05-30 MED ORDER — LORAZEPAM 1 MG PO TABS
1.0000 mg | ORAL_TABLET | Freq: Four times a day (QID) | ORAL | Status: AC
  Administered 2015-05-30 – 2015-05-31 (×5): 1 mg via ORAL
  Filled 2015-05-30 (×6): qty 1

## 2015-05-30 MED ORDER — ENSURE ENLIVE PO LIQD
237.0000 mL | Freq: Two times a day (BID) | ORAL | Status: DC
  Administered 2015-05-31 – 2015-06-04 (×8): 237 mL via ORAL

## 2015-05-30 MED ORDER — LORAZEPAM 1 MG PO TABS: 0.0000 mg | ORAL_TABLET | Freq: Two times a day (BID) | ORAL | Status: DC

## 2015-05-30 MED ORDER — ADULT MULTIVITAMIN W/MINERALS CH
1.0000 | ORAL_TABLET | Freq: Every day | ORAL | Status: DC
  Administered 2015-05-30 – 2015-06-04 (×6): 1 via ORAL
  Filled 2015-05-30 (×9): qty 1

## 2015-05-30 NOTE — Progress Notes (Signed)
D: Pt presents flat, anxious, and depressed. Pt observed in his bed resting. Pt did not attend group. Pt plans on participating in group tomorrow. Pt informed that AA/NA is offered on our adjacent unit. Pt denied any SI/HI/AVH. Pt denies any hx of hallucinations/psychosis. Pt did not appear to be responding to any internal stimuli. Pt's CIWA rated at a (6).  A: Writer administered scheduled and prn medications to pt, per MD orders. Continued support and availability as needed was extended to this pt. Staff continues to monitor pt with q7615min checks.  R: No adverse drug reactions noted. Pt receptive to treatment. Pt remains safe at this time.

## 2015-05-30 NOTE — BH Assessment (Addendum)
[facility] Assessment Progress Note  Per Thedore MinsMojeed Akintayo, MD, this pt requires psychiatric hospitalization at this time.  Berneice Heinrichina Tate, RN, [facility]; he is to be transported after 17:00.  Pt has signed Voluntary Admission and Consent for Treatment, as well as Consent to Release Information to [facility], [facility], and a notification call has been placed.  [facility].  Pt's nurse, Marylu LundJanet, has been notified, and agrees to send original paperwork along with pt via [facility], and to call report to [phone].  Doylene Canninghomas Shenita Trego, MA Triage Specialist [phone]

## 2015-05-30 NOTE — Consult Note (Signed)
[facility]iatry Consult   Reason for Consult:  Depression, Alcohol intoxication Referring Physician:  EDP Patient Identification: Thomas Atkins MRN:  [id number] Principal Diagnosis: Alcohol use disorder, severe, dependence ([place]) Diagnosis:   Patient Active Problem List   Diagnosis Date Noted  . Alcohol use disorder, severe, dependence ([place]) [F10.20] 05/30/2015    Priority: High  . Depressive disorder [F32.9] 02/16/2013    Priority: Medium  . Alcohol-induced mood disorder ([place]) [F10.94] 06/29/2013  . Alcohol dependence ([place]) [F10.20] 02/19/2013  . Alcoholism ([place]) [F10.20] 02/16/2013    Total Time spent with patient: 45 minutes  Subjective:   Thomas Atkins is a 40 y.o. male patient admitted with  Depression, Alcohol intoxication  HPI:  Caucasian male, 40 years old was evaluated for increased feelings of depression and Alcohol use.  Patient has a hx of depression and Alcoholism.  He reports today that he stopped taking his medications because he could not afford the two.  Patient reports that he has had issues with Alcohol and has been to several detox treatment center.  Patient reports his stressors includes not being able to secure a job and a place to stay.  He stopped seeking medication management at [facility].   Patient denies suicidal ideation but states that he does not want to live anymore.  Patient denies HI/AVH.   He has been accepted for admission and we will be seeking placement at any facility with available bed.  Past Psychiatric History:    Depression, Alcoholism  Risk to Self: Suicidal Ideation: Yes-Currently Present Suicidal Intent: No Is patient at risk for suicide?: No Suicidal Plan?: No Access to Means: No What has been your use of drugs/alcohol within the last 12 months?: Alcohol How many times?: 1 ("years and years ago" ) Other Self Harm Risks: Denies Triggers for Past Attempts: None known Intentional Self Injurious Behavior: None Risk to Others:  Homicidal Ideation: No Thoughts of Harm to Others: No Current Homicidal Intent: No Current Homicidal Plan: No Access to Homicidal Means: No Identified Victim: Denies History of harm to others?: No Assessment of Violence: None Noted Violent Behavior Description: Denies Does patient have access to weapons?: No Criminal Charges Pending?: No Does patient have a court date: No Prior Inpatient Therapy: Prior Inpatient Therapy: Yes Prior Therapy Dates: 2016 Prior Therapy Facilty/Provider(s): [facility] Reason for Treatment: SA Prior Outpatient Therapy: Prior Outpatient Therapy: Yes Prior Therapy Dates: 2015-Present Prior Therapy Facilty/Provider(s): [facility] Reason for Treatment: Medication Management (Trazadone, Prozac, ) Does patient have an ACCT team?: No Does patient have Intensive In-House Services?  : No Does patient have [facility] services? : Yes Does patient have P4CC services?: No  Past Medical History:  Past Medical History  Diagnosis Date  . Syncope   . Seizure disorder ([place])   . Alcoholism ([place])   . Depression   . Anxiety     Past Surgical History  Procedure Laterality Date  . Surgery scrotal / testicular     Family History:  Family History  Problem Relation Age of Onset  . Heart disease Paternal Grandmother    Family Psychiatric  History:  Denies Social History:  History  Alcohol Use  . 7.2 oz/week  . 12 Cans of beer per week    Comment: 200oz/day      History  Drug Use  . Yes  . Special: Marijuana    Social History   Social History  . Marital Status: Divorced    Spouse Name: N/A  . Number of Children: 2  .  Years of Education: N/A   Occupational History  . Unemployed    Social History Main Topics  . Smoking status: Current Every Day Smoker -- 0.50 packs/day    Types: Cigarettes  . Smokeless tobacco: None  . Alcohol Use: 7.2 oz/week    12 Cans of beer per week     Comment: 200oz/day   . Drug Use: Yes    Special: Marijuana  . Sexual Activity: Not  Asked   Other Topics Concern  . None   Social History Narrative   Additional Social History:    Allergies:   Allergies  Allergen Reactions  . Lactose Intolerance (Gi) Diarrhea, Nausea Only and Other (See Comments)    Abdominal pain    . Ultram [Tramadol Hcl] Hives         Labs:  Results for orders placed or performed during the hospital encounter of 05/29/15 (from the past 48 hour(s))  Comprehensive metabolic panel     Status: Abnormal   Collection Time: 05/29/15 10:24 PM  Result Value Ref Range   Sodium 141 135 - 145 mmol/L   Potassium 4.4 3.5 - 5.1 mmol/L   Chloride 105 101 - 111 mmol/L   CO2 23 22 - 32 mmol/L   Glucose, Bld 91 65 - 99 mg/dL   BUN 13 6 - 20 mg/dL   Creatinine, Ser 0.89 0.61 - 1.24 mg/dL   Calcium 9.4 8.9 - 10.3 mg/dL   Total Protein 8.2 (H) 6.5 - 8.1 g/dL   Albumin 4.9 3.5 - 5.0 g/dL   AST 85 (H) 15 - 41 U/L   ALT 78 (H) 17 - 63 U/L   Alkaline Phosphatase 67 38 - 126 U/L   Total Bilirubin 0.4 0.3 - 1.2 mg/dL   GFR calc non Af Amer >60 >60 mL/min   GFR calc Af Amer >60 >60 mL/min    Comment: (NOTE) The eGFR has been calculated using the CKD EPI equation. This calculation has not been validated in all clinical situations. eGFR's persistently <60 mL/min signify possible Chronic Kidney Disease.    Anion gap 13 5 - 15  Ethanol (ETOH)     Status: Abnormal   Collection Time: 05/29/15 10:24 PM  Result Value Ref Range   Alcohol, Ethyl (B) 350 (HH) <5 mg/dL    Comment:        LOWEST DETECTABLE LIMIT FOR SERUM ALCOHOL IS 5 mg/dL FOR MEDICAL PURPOSES ONLY CRITICAL RESULT CALLED TO, READ BACK BY AND VERIFIED WITH: ABBY LUDWELL, RN 2310 05/29/15 BY A NORMAN   Salicylate level     Status: None   Collection Time: 05/29/15 10:24 PM  Result Value Ref Range   Salicylate Lvl <4.2 2.8 - 30.0 mg/dL  Acetaminophen level     Status: Abnormal   Collection Time: 05/29/15 10:24 PM  Result Value Ref Range   Acetaminophen (Tylenol), Serum <10 (L) 10 - 30 ug/mL     Comment:        THERAPEUTIC CONCENTRATIONS VARY SIGNIFICANTLY. A RANGE OF 10-30 ug/mL MAY BE AN EFFECTIVE CONCENTRATION FOR MANY PATIENTS. HOWEVER, SOME ARE BEST TREATED AT CONCENTRATIONS OUTSIDE THIS RANGE. ACETAMINOPHEN CONCENTRATIONS >150 ug/mL AT 4 HOURS AFTER INGESTION AND >50 ug/mL AT 12 HOURS AFTER INGESTION ARE OFTEN ASSOCIATED WITH TOXIC REACTIONS.   CBC     Status: Abnormal   Collection Time: 05/29/15 10:24 PM  Result Value Ref Range   WBC 7.5 4.0 - 10.5 K/uL   RBC 4.58 4.22 - 5.81 MIL/uL   Hemoglobin 15.5  13.0 - 17.0 g/dL   HCT 42.9 39.0 - 52.0 %   MCV 93.7 78.0 - 100.0 fL   MCH 33.8 26.0 - 34.0 pg   MCHC 36.1 (H) 30.0 - 36.0 g/dL   RDW 13.8 11.5 - 15.5 %   Platelets 253 150 - 400 K/uL  Urine rapid drug screen (hosp performed) (Not at [facility])     Status: None   Collection Time: 05/30/15  1:11 AM  Result Value Ref Range   Opiates NONE DETECTED NONE DETECTED   Cocaine NONE DETECTED NONE DETECTED   Benzodiazepines NONE DETECTED NONE DETECTED   Amphetamines NONE DETECTED NONE DETECTED   Tetrahydrocannabinol NONE DETECTED NONE DETECTED   Barbiturates NONE DETECTED NONE DETECTED    Comment:        DRUG SCREEN FOR MEDICAL PURPOSES ONLY.  IF CONFIRMATION IS NEEDED FOR ANY PURPOSE, NOTIFY LAB WITHIN 5 DAYS.        LOWEST DETECTABLE LIMITS FOR URINE DRUG SCREEN Drug Class       Cutoff (ng/mL) Amphetamine      1000 Barbiturate      200 Benzodiazepine   166 Tricyclics       063 Opiates          300 Cocaine          300 THC              50     Current Facility-Administered Medications  Medication Dose Route Frequency Provider Last Rate Last Dose  . LORazepam (ATIVAN) tablet 0-4 mg  0-4 mg Oral 4 times per day Antonietta Breach, PA-C   0 mg at 05/30/15 0604   Followed by  . [START ON 06/01/2015] LORazepam (ATIVAN) tablet 0-4 mg  0-4 mg Oral Q12H Antonietta Breach, PA-C      . ondansetron (ZOFRAN-ODT) disintegrating tablet 4 mg  4 mg Oral Once Domenic Moras, PA-C        Current Outpatient Prescriptions  Medication Sig Dispense Refill  . FLUoxetine (PROZAC) 20 MG capsule Take 40 mg by mouth at bedtime.     . traZODone (DESYREL) 100 MG tablet Take 100 mg by mouth at bedtime.    Marland Kitchen FLUoxetine (PROZAC) 40 MG capsule Take 1 capsule (40 mg total) by mouth at bedtime. (Patient not taking: Reported on 05/15/2014) 2 capsule 0  . hydrOXYzine (ATARAX/VISTARIL) 25 MG tablet Take 1 tablet (25 mg total) by mouth every 6 (six) hours as needed for anxiety. (Patient not taking: Reported on 05/15/2014) 4 tablet 0  . traZODone (DESYREL) 50 MG tablet Take 1-2 tablets (50-100 mg total) by mouth at bedtime. (Patient not taking: Reported on 05/15/2014) 4 tablet 0    Musculoskeletal: Strength & Muscle Tone: within normal limits Gait & Station: normal Patient leans: N/A  Psychiatric Specialty Exam: Review of Systems  Constitutional: Negative.   HENT: Negative.   Eyes: Negative.   Respiratory: Negative.   Cardiovascular: Negative.   Gastrointestinal: Negative.   Genitourinary: Negative.   Musculoskeletal: Negative.   Skin: Negative.   Neurological: Negative.   Endo/Heme/Allergies: Negative.     Blood pressure 124/87, pulse 80, temperature 97.9 F (36.6 C), temperature source Oral, resp. rate 20, SpO2 94 %.There is no weight on file to calculate BMI.  General Appearance: Casual  Eye Contact::  Good  Speech:  Clear and Coherent and Normal Rate  Volume:  Normal  Mood:  Depressed  Affect:  Congruent and Depressed  Thought Process:  Coherent, Goal Directed and Intact  Orientation:  Full (Time, Place, and Person)  Thought Content:  WDL  Suicidal Thoughts:  No  Homicidal Thoughts:  No  Memory:  Immediate;   Good Recent;   Good Remote;   Good  Judgement:  Good  Insight:  Fair  Psychomotor Activity:  Psychomotor Retardation  Concentration:  Good  Recall:  NA  Fund of Knowledge:Good  Language: Good  Akathisia:  No  Handed:  Right  AIMS (if indicated):     Assets:   Desire for Improvement  ADL's:  Intact  Cognition: WNL  Sleep:      Treatment Plan Summary: Daily contact with patient to assess and evaluate symptoms and progress in treatment and Medication management  Disposition:  Accepted for admission and we will be seeking placement at any facility with available bed.   We have started using our Ativan protocol for his detox treatment.  We will resume his Prozac for depression and Trazodone for sleep.  Delfin Gant, NP   PMHNP-BC 05/30/2015 10:46 AM Patient seen face-to-face for psychiatric evaluation, chart reviewed and case discussed with the physician extender and developed treatment plan. Reviewed the information documented and agree with the treatment plan. Corena Pilgrim, MD
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
## 2015-05-30 NOTE — ED Notes (Addendum)
Pt is asleep on his right side with regular respirations. Pt does not appear in any distress. Pt vomitted at (10:15am )pt was given 4mg  of zofran ODT and 2mg  of ativan. CIWA is a 7. -2:30pm- pt has a visitor. He did eat breakfast and lunch . Pt stated he is feeling a little better. (2:30pm ) Pts significant other came to visit and stated the pt has been drinking since the age of 40. Pt has been in numerous rehab facilities. [facility]. [facility]. [facility]30[facility] today via [place]. 5pm [facility]. CIWA a 8. Pt medicated with 2mg  of ativan

## 2015-05-30 NOTE — ED Provider Notes (Signed)
CSN: [id number]     Arrival date & time 05/29/15  2130 History   First MD Initiated Contact with Patient 05/30/15 0222     Chief Complaint  Patient presents with  . Suicidal     (Consider location/radiation/quality/duration/timing/severity/associated sxs/prior Treatment) HPI Comments: 40 year old male with a history of seizure disorder, alcoholism, depression, anxiety presents to the emergency department for evaluation of suicidal ideations without plan. He states, "I'm glad I don't have my guns". He reports that his guns are within [place]. He denies a history of suicide attempts. He does have a history of suicidal ideations. Patient reports drinking a few 40 ounce beers prior to arrival. He denies any illicit drug use. No homicidal ideations.  The history is provided by the patient. No language interpreter was used.    Past Medical History  Diagnosis Date  . Syncope   . Seizure disorder (HCC)   . Alcoholism (HCC)   . Depression   . Anxiety    Past Surgical History  Procedure Laterality Date  . Surgery scrotal / testicular     Family History  Problem Relation Age of Onset  . Heart disease Paternal Grandmother    Social History  Substance Use Topics  . Smoking status: Current Every Day Smoker -- 0.50 packs/day    Types: Cigarettes  . Smokeless tobacco: None  . Alcohol Use: 7.2 oz/week    12 Cans of beer per week     Comment: 200oz/day     Review of Systems  Psychiatric/Behavioral: Positive for suicidal ideas and behavioral problems.  All other systems reviewed and are negative.   Allergies  Lactose intolerance (gi) and Ultram  Home Medications   Prior to Admission medications   Medication Sig Start Date End Date Taking? Authorizing Provider  FLUoxetine (PROZAC) 20 MG capsule Take 40 mg by mouth at bedtime.    Yes Historical Provider, MD  traZODone (DESYREL) 100 MG tablet Take 100 mg by mouth at bedtime.   Yes Historical Provider, MD   FLUoxetine (PROZAC) 40 MG capsule Take 1 capsule (40 mg total) by mouth at bedtime. Patient not taking: Reported on 05/15/2014 12/03/13   Earney NavyJosephine C Onuoha, NP  hydrOXYzine (ATARAX/VISTARIL) 25 MG tablet Take 1 tablet (25 mg total) by mouth every 6 (six) hours as needed for anxiety. Patient not taking: Reported on 05/15/2014 12/03/13   Earney NavyJosephine C Onuoha, NP  traZODone (DESYREL) 50 MG tablet Take 1-2 tablets (50-100 mg total) by mouth at bedtime. Patient not taking: Reported on 05/15/2014 12/03/13   Earney NavyJosephine C Onuoha, NP   BP 99/67 mmHg  Pulse 76  Temp(Src) 97.3 F (36.3 C) (Oral)  Resp 20  SpO2 93%   Physical Exam  Constitutional: He is oriented to person, place, and time. He appears well-developed and well-nourished. No distress.  HENT:  Head: Normocephalic and atraumatic.  Eyes: Conjunctivae and EOM are normal. No scleral icterus.  Neck: Normal range of motion.  Cardiovascular: Normal rate, regular rhythm and intact distal pulses.   Pulmonary/Chest: Effort normal and breath sounds normal. No respiratory distress.  Musculoskeletal: Normal range of motion.  Neurological: He is alert and oriented to person, place, and time. He exhibits normal muscle tone. Coordination normal.  Skin: Skin is warm and dry. No rash noted. He is not diaphoretic. No erythema. No pallor.  Psychiatric: He has a normal mood and affect. His behavior is normal. He expresses suicidal ideation. He expresses no homicidal ideation. He expresses no suicidal plans  and no homicidal plans.  Nursing note and vitals reviewed.   ED Course  Procedures (including critical care time) Labs Review Labs Reviewed  COMPREHENSIVE METABOLIC PANEL - Abnormal; Notable for the following:    Total Protein 8.2 (*)    AST 85 (*)    ALT 78 (*)    All other components within normal limits  ETHANOL - Abnormal; Notable for the following:    Alcohol, Ethyl (B) 350 (*)    All other components within normal limits  ACETAMINOPHEN LEVEL -  Abnormal; Notable for the following:    Acetaminophen (Tylenol), Serum <10 (*)    All other components within normal limits  CBC - Abnormal; Notable for the following:    MCHC 36.1 (*)    All other components within normal limits  SALICYLATE LEVEL  URINE RAPID DRUG SCREEN, HOSP PERFORMED    Imaging Review No results found.   I have personally reviewed and evaluated these images and lab results as part of my medical decision-making.   EKG Interpretation None      MDM   Final diagnoses:  Alcohol-induced mood disorder (HCC)    Patient medically cleared and evaluated by TTS. He has been recommended for inpatient treatment once BAC is under 250. Disposition to be determined by oncoming ED provider.   Filed Vitals:   05/29/15 2139 05/29/15 2206 05/30/15 0124  BP:  111/88 99/67  Pulse:  90 76  Temp:  97.7 F (36.5 C) 97.3 F (36.3 C)  TempSrc:  Oral Oral  Resp:  18 20  SpO2: 96% 97% 93%     Antony Madura, PA-C 05/30/15 0447  Derwood Kaplan, MD 05/30/15 [phone]
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
## 2015-05-30 NOTE — ED Notes (Signed)
Sitter stated "he went to sleep right after he got back here around 0130"

## 2015-05-30 NOTE — ED Notes (Signed)
Pt personal belongings bag has been moved to locker 33

## 2015-05-30 NOTE — BH Assessment (Signed)
Assessment completed. Consulted with Donell SievertSpencer Simon, PA-C who recommends inpatient treatment (300) once his BAL has come down to 200.  Davina PokeJoVea Larena Ohnemus, LCSW Therapeutic Triage Specialist [facility] 05/30/2015 4:03 AM

## 2015-05-30 NOTE — Tx Team (Signed)
Initial Interdisciplinary Treatment Plan   PATIENT STRESSORS: Financial difficulties Medication change or noncompliance Substance abuse   PATIENT STRENGTHS: Wellsite geologistCommunication skills General fund of knowledge   PROBLEM LIST: Problem List/Patient Goals Date to be addressed Date deferred Reason deferred Estimated date of resolution  Substance Abuse 05/30/2015           Depression 05/30/2015           Suicide Risk  05/30/2015           "Get rid of these shakes" 05/30/2015           "Get some sleep" 05/30/2015      DISCHARGE CRITERIA:  Improved stabilization in mood, thinking, and/or behavior Verbal commitment to aftercare and medication compliance Withdrawal symptoms are absent or subacute and managed without 24-hour nursing intervention  PRELIMINARY DISCHARGE PLAN: Attend 12-step recovery group Outpatient therapy  PATIENT/FAMIILY INVOLVEMENT: This treatment plan has been presented to and reviewed with the patient, Thomas Atkins.  The patient and family have been given the opportunity to ask questions and make suggestions.  Marzetta BoardDopson, Souleymane Saiki E 05/30/2015, 7:07 PM

## 2015-05-31 ENCOUNTER — Encounter (HOSPITAL_COMMUNITY): Payer: Self-pay | Admitting: Psychiatry

## 2015-05-31 DIAGNOSIS — F4310 Post-traumatic stress disorder, unspecified: Secondary | ICD-10-CM | POA: Diagnosis present

## 2015-05-31 DIAGNOSIS — F1014 Alcohol abuse with alcohol-induced mood disorder: Secondary | ICD-10-CM | POA: Diagnosis present

## 2015-05-31 DIAGNOSIS — F1020 Alcohol dependence, uncomplicated: Secondary | ICD-10-CM

## 2015-05-31 DIAGNOSIS — F332 Major depressive disorder, recurrent severe without psychotic features: Principal | ICD-10-CM

## 2015-05-31 MED ORDER — GABAPENTIN 100 MG PO CAPS
200.0000 mg | ORAL_CAPSULE | ORAL | Status: DC
  Administered 2015-05-31 – 2015-06-04 (×12): 200 mg via ORAL
  Filled 2015-05-31 (×19): qty 2

## 2015-05-31 MED ORDER — FLUOXETINE HCL 20 MG PO CAPS
40.0000 mg | ORAL_CAPSULE | Freq: Every day | ORAL | Status: DC
  Administered 2015-05-31 – 2015-06-03 (×4): 40 mg via ORAL
  Filled 2015-05-31 (×8): qty 2

## 2015-05-31 NOTE — [facility] Group Notes (Signed)
[facility] Group Notes:  (Nursing/MHT/Case Management/Adjunct)  Date:  05/31/2015  Time:  3:09 PM  Type of Therapy:  Nurse Education  Participation Level:  Did Not Attend   Mickie Baillizabeth O Iwenekha 05/31/2015, 3:09 PM
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
## 2015-05-31 NOTE — Progress Notes (Signed)
NUTRITION ASSESSMENT  Pt identified as at risk on the Malnutrition Screen Tool  INTERVENTION: 1. Educated patient on the importance of nutrition and encouraged intake of food and beverages. 2. Discussed weight goals. 3. Supplements: Continue Ensure Enlive po BID, each supplement provides 350 kcal and 20 grams of protein   NUTRITION DIAGNOSIS: Unintentional weight loss related to sub-optimal intake as evidenced by pt report.   Goal: Pt to meet >/= 90% of their estimated nutrition needs.  Monitor:  PO intake  Assessment:  Pt admitted with SI. Pt with hx of alcoholism; drank a few 40 ounce beers PTA. Ensure Enlive already ordered BID for pt. Pt ate well for all meals yesterday (3/22). No recent weight hx available in the chart.     40 y.o. male  Height: Ht Readings from Last 1 Encounters:  05/30/15 6\' 1"  (1.854 m)    Weight: Wt Readings from Last 1 Encounters:  05/30/15 151 lb (68.493 kg)    Weight Hx: Wt Readings from Last 10 Encounters:  05/30/15 151 lb (68.493 kg)  06/29/13 169 lb (76.658 kg)  02/16/13 153 lb (69.4 kg)  12/13/10 150 lb (68.04 kg)    BMI:  Body mass index is 19.93 kg/(m^2). Pt meets criteria for normal weight based on current BMI.  Estimated Nutritional Needs: Kcal: 25-30 kcal/kg Protein: > 1 gram protein/kg Fluid: 1 ml/kcal  Diet Order:   Pt is also offered choice of unit snacks mid-morning and mid-afternoon.  Pt is eating as desired.   Lab results and medications reviewed.      Trenton GammonJessica Nandan Willems, RD, LDN Inpatient Clinical Dietitian Pager # [phone] After hours/weekend pager # [phone]
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
## 2015-05-31 NOTE — Plan of Care (Signed)
Problem: Alteration in mood & ability to function due to Goal: STG-Patient will report withdrawal symptoms Outcome: Progressing Pt reported body aches, chills /sweats with harshness of sounds when assessed for CIWA. Visible tremors noted to bilateral arms. Pt compliant with current treatment regimen thus far. Goal: STG-Patient will comply with prescribed medication regimen (Patient will comply with prescribed medication regimen)  Outcome: Progressing Pt has been compliant with medications as per order. Denies adverse drug reactions when assessed.
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
## 2015-05-31 NOTE — [facility] Group Notes (Signed)
[facility] Group Notes:  (Counselor/Nursing/MHT/Case Management/Adjunct)  05/31/2015 1:15PM  Type of Therapy:  Group Therapy  Participation Level:  Active  Participation Quality:  Appropriate  Affect:  Flat  Cognitive:  Oriented  Insight:  Improving  Engagement in Group:  Limited  Engagement in Therapy:  Limited  Modes of Intervention:  Discussion, Exploration and Socialization  Summary of Progress/Problems: The topic for group was balance in life.  Pt participated in the discussion about when their life was in balance and out of balance and how this feels.  Pt discussed ways to get back in balance and short term goals they can work on to get where they want to be.  "I feel like I have been off of the track for a long time.  And every time I get back on track, something else knocks me off.  I no longer have normalcy."  Talked about his divorce, his use of drugs and alcohol and his tendency to see things fatalistically.  "When i am triggered, I often just say f___ it, and throw it all away."  Good feedback to others.    Daryel Geraldorth, Rodney B 05/31/2015 1:11 PM    [facility]  Santa GeneraAnne Kassady Laboy, LCSW Lead Clinical Social Worker Phone:  [phone]

## 2015-05-31 NOTE — H&P (Signed)
Psychiatric Admission Assessment Adult  Patient Identification: Thomas Atkins  MRN:  [id number]  Date of Evaluation:  05/31/2015  Chief Complaint:  ALCOHOL INDUCED DEPRESSIVE DISORDER MILD USE DISORDER  Principal Diagnosis: Alcohol use disorder, severe, dependence, MDD (major depressive disorder), recurrent severe, without psychosis ([place])  Diagnosis:   Patient Active Problem List   Diagnosis Date Noted  . Alcohol use disorder, severe, dependence ([place]) [F10.20] 05/30/2015  . Depression [F32.9] 05/30/2015  . Alcohol-induced mood disorder ([place]) [F10.94] 06/29/2013  . Alcohol dependence ([place]) [F10.20] 02/19/2013  . Alcoholism ([place]) [F10.20] 02/16/2013  . Depressive disorder [F32.9] 02/16/2013   History of Present Illness: Thomas Atkins is a 40 year old Caucasian male. Admitted to this [facility] hospital from the [facility] ED with complaints of alcohol intoxication & worsening symptoms of depression. He is requesting help with alcohol detox & substance abuse treatment after discharge. During this assessment, Thomas Atkins reports, "The ambulance took me to the Hospital yesterday. I had walked to the fire station near my home intoxicated & they called the ambulance for me. I just need help with my alcoholism. I need clean time again. I cannot do it on my own. I have been drinking heavily for the last 6 months, lots of beer. I drink as soon I woke up in the morning, I drink through the afternoon, I drink prior to going to bed. When I drink, it will make me feel sick. I have been forgetful, not knowing how I got home, how much I spent, unable to keep my words with people because I forgot that I made a promise to someone. I have been an alcoholic x 30 years. My longest sobriety was for 190 days when I was in jail 5 years ago. I'm depressed because I'm drinking, I'm drinking because I'm depressed. I have bad criminal record. I have had daily blackouts, alcohol related withdrawal seizures & my mind is constantly racing.  I would want to participate in an outpatient treatment program after detoxification". Denies any other drug use.  Associated Signs/Symptoms:  Depression Symptoms:  depressed mood, insomnia, feelings of worthlessness/guilt, impaired memory, anxiety, loss of energy/fatigue, decreased appetite,  (Hypo) Manic Symptoms:  Impulsivity, Irritable Mood,  Anxiety Symptoms:  Excessive Worry,  Psychotic Symptoms:  "I see shadows"  PTSD Symptoms: Denies any flashbacks or intrusive thoughts  Total Time spent with patient: 1 hour  Past Psychiatric History: Alcoholism x 30 years, Major depression  Is the patient at risk to self? No.  Has the patient been a risk to self in the past 6 months? No.  Has the patient been a risk to self within the distant past? Yes.   (Hx. Suicide attempt) Is the patient a risk to others? No.  Has the patient been a risk to others in the past 6 months? No.  Has the patient been a risk to others within the distant past? No.   Prior Inpatient Therapy: Yes ([facility], [facility], [facility])  Prior Outpatient Therapy: Yes ([facility])  Alcohol Screening: 1. How often do you have a drink containing alcohol?: 4 or more times a week 2. How many drinks containing alcohol do you have on a typical day when you are drinking?: 10 or more 3. How often do you have six or more drinks on one occasion?: Daily or almost daily Preliminary Score: 8 4. How often during the last year have you found that you were not able to stop drinking once you had started?: Daily or almost daily 5. How often during  the last year have you failed to do what was normally expected from you becasue of drinking?: Daily or almost daily 6. How often during the last year have you needed a first drink in the morning to get yourself going after a heavy drinking session?: Daily or almost daily 7. How often during the last year have you had a feeling of guilt of remorse after drinking?: Daily or almost daily 8. How often  during the last year have you been unable to remember what happened the night before because you had been drinking?: Weekly 9. Have you or someone else been injured as a result of your drinking?: No 10. Has a relative or friend or a doctor or another health worker been concerned about your drinking or suggested you cut down?: No Alcohol Use Disorder Identification Test Final Score (AUDIT): 31 Brief Intervention: Yes  Substance Abuse History in the last 12 months:  Yes.    Consequences of Substance Abuse: Medical Consequences:  Liver damage, Possible death by overdose Legal Consequences:  Arrests, jail time, Loss of driving privilege. Family Consequences:  Family discord, divorce and or separation.  Previous Psychotropic Medications: Yes (Prozac & Trazodone)  Psychological Evaluations: Yes   Past Medical History:  Past Medical History  Diagnosis Date  . Syncope   . Seizure disorder ([place])   . Alcoholism ([place])   . Depression   . Anxiety     Past Surgical History  Procedure Laterality Date  . Surgery scrotal / testicular     Family History:  Family History  Problem Relation Age of Onset  . Heart disease Paternal Grandmother    Family Psychiatric  History: Alcoholism: Father  Tobacco Screening: Smokes a pack of cigarette daily  Social History:  History  Alcohol Use  . 7.2 oz/week  . 12 Cans of beer per week    Comment: 200oz/day      History  Drug Use  . Yes  . Special: Marijuana    Additional Social History:  Allergies:   Allergies  Allergen Reactions  . Lactose Intolerance (Gi) Diarrhea, Nausea Only and Other (See Comments)    Abdominal pain    . Ultram [Tramadol Hcl] Hives        Lab Results:  Results for orders placed or performed during the hospital encounter of 05/29/15 (from the past 48 hour(s))  Comprehensive metabolic panel     Status: Abnormal   Collection Time: 05/29/15 10:24 PM  Result Value Ref Range   Sodium 141 135 - 145 mmol/L   Potassium  4.4 3.5 - 5.1 mmol/L   Chloride 105 101 - 111 mmol/L   CO2 23 22 - 32 mmol/L   Glucose, Bld 91 65 - 99 mg/dL   BUN 13 6 - 20 mg/dL   Creatinine, Ser 0.89 0.61 - 1.24 mg/dL   Calcium 9.4 8.9 - 10.3 mg/dL   Total Protein 8.2 (H) 6.5 - 8.1 g/dL   Albumin 4.9 3.5 - 5.0 g/dL   AST 85 (H) 15 - 41 U/L   ALT 78 (H) 17 - 63 U/L   Alkaline Phosphatase 67 38 - 126 U/L   Total Bilirubin 0.4 0.3 - 1.2 mg/dL   GFR calc non Af Amer >60 >60 mL/min   GFR calc Af Amer >60 >60 mL/min    Comment: (NOTE) The eGFR has been calculated using the CKD EPI equation. This calculation has not been validated in all clinical situations. eGFR's persistently <60 mL/min signify possible Chronic Kidney Disease.  Anion gap 13 5 - 15  Ethanol (ETOH)     Status: Abnormal   Collection Time: 05/29/15 10:24 PM  Result Value Ref Range   Alcohol, Ethyl (B) 350 (HH) <5 mg/dL    Comment:        LOWEST DETECTABLE LIMIT FOR SERUM ALCOHOL IS 5 mg/dL FOR MEDICAL PURPOSES ONLY CRITICAL RESULT CALLED TO, READ BACK BY AND VERIFIED WITH: ABBY LUDWELL, RN [id number] 05/29/15 BY A NORMAN   Salicylate level     Status: None   Collection Time: 05/29/15 10:24 PM  Result Value Ref Range   Salicylate Lvl <1.5 2.8 - 30.0 mg/dL  Acetaminophen level     Status: Abnormal   Collection Time: 05/29/15 10:24 PM  Result Value Ref Range   Acetaminophen (Tylenol), Serum <10 (L) 10 - 30 ug/mL    Comment:        THERAPEUTIC CONCENTRATIONS VARY SIGNIFICANTLY. A RANGE OF 10-30 ug/mL MAY BE AN EFFECTIVE CONCENTRATION FOR MANY PATIENTS. HOWEVER, SOME ARE BEST TREATED AT CONCENTRATIONS OUTSIDE THIS RANGE. ACETAMINOPHEN CONCENTRATIONS >150 ug/mL AT 4 HOURS AFTER INGESTION AND >50 ug/mL AT 12 HOURS AFTER INGESTION ARE OFTEN ASSOCIATED WITH TOXIC REACTIONS.   CBC     Status: Abnormal   Collection Time: 05/29/15 10:24 PM  Result Value Ref Range   WBC 7.5 4.0 - 10.5 K/uL   RBC 4.58 4.22 - 5.81 MIL/uL   Hemoglobin 15.5 13.0 - 17.0 g/dL   HCT  42.9 39.0 - 52.0 %   MCV 93.7 78.0 - 100.0 fL   MCH 33.8 26.0 - 34.0 pg   MCHC 36.1 (H) 30.0 - 36.0 g/dL   RDW 13.8 11.5 - 15.5 %   Platelets 253 150 - 400 K/uL  Urine rapid drug screen (hosp performed) (Not at [facility]     Status: None   Collection Time: 05/30/15  1:11 AM  Result Value Ref Range   Opiates NONE DETECTED NONE DETECTED   Cocaine NONE DETECTED NONE DETECTED   Benzodiazepines NONE DETECTED NONE DETECTED   Amphetamines NONE DETECTED NONE DETECTED   Tetrahydrocannabinol NONE DETECTED NONE DETECTED   Barbiturates NONE DETECTED NONE DETECTED    Comment:        DRUG SCREEN FOR MEDICAL PURPOSES ONLY.  IF CONFIRMATION IS NEEDED FOR ANY PURPOSE, NOTIFY LAB WITHIN 5 DAYS.        LOWEST DETECTABLE LIMITS FOR URINE DRUG SCREEN Drug Class       Cutoff (ng/mL) Amphetamine      1000 Barbiturate      200 Benzodiazepine   400 Tricyclics       867 Opiates          300 Cocaine          300 THC              50    Blood Alcohol level:  Lab Results  Component Value Date   ETH 350* 05/29/2015   ETH <11 61/95/0932   Metabolic Disorder Labs:  No results found for: HGBA1C, MPG No results found for: PROLACTIN No results found for: CHOL, TRIG, HDL, CHOLHDL, VLDL, LDLCALC  Current Medications: Current Facility-Administered Medications  Medication Dose Route Frequency Provider Last Rate Last Dose  . acetaminophen (TYLENOL) tablet 650 mg  650 mg Oral Q6H PRN Delfin Gant, NP      . alum & mag hydroxide-simeth (MAALOX/MYLANTA) 200-200-20 MG/5ML suspension 30 mL  30 mL Oral Q4H PRN Delfin Gant, NP      .  feeding supplement (ENSURE ENLIVE) (ENSURE ENLIVE) liquid 237 mL  237 mL Oral BID BM Saramma Eappen, MD   237 mL at 05/31/15 1030  . hydrOXYzine (ATARAX/VISTARIL) tablet 25 mg  25 mg Oral Q6H PRN Kerrie Buffalo, NP      . loperamide (IMODIUM) capsule 2-4 mg  2-4 mg Oral PRN Kerrie Buffalo, NP      . LORazepam (ATIVAN) tablet 1 mg  1 mg Oral Q6H PRN Kerrie Buffalo, NP       . LORazepam (ATIVAN) tablet 1 mg  1 mg Oral QID Kerrie Buffalo, NP   1 mg at 05/31/15 0826   Followed by  . [START ON 06/01/2015] LORazepam (ATIVAN) tablet 1 mg  1 mg Oral TID Kerrie Buffalo, NP       Followed by  . [START ON 06/02/2015] LORazepam (ATIVAN) tablet 1 mg  1 mg Oral BID Kerrie Buffalo, NP       Followed by  . [START ON 06/03/2015] LORazepam (ATIVAN) tablet 1 mg  1 mg Oral Daily Kerrie Buffalo, NP      . magnesium hydroxide (MILK OF MAGNESIA) suspension 30 mL  30 mL Oral Daily PRN Delfin Gant, NP      . multivitamin with minerals tablet 1 tablet  1 tablet Oral Daily Kerrie Buffalo, NP   1 tablet at 05/31/15 0825  . nicotine (NICODERM CQ - dosed in mg/24 hours) patch 21 mg  21 mg Transdermal Daily Ursula Alert, MD   21 mg at 05/31/15 0824  . ondansetron (ZOFRAN-ODT) disintegrating tablet 4 mg  4 mg Oral Q6H PRN Kerrie Buffalo, NP      . thiamine (VITAMIN B-1) tablet 100 mg  100 mg Oral Daily Kerrie Buffalo, NP   100 mg at 05/31/15 0825  . traZODone (DESYREL) tablet 100 mg  100 mg Oral QHS,MR X 1 Laverle Hobby, PA-C   100 mg at 05/30/15 2133   PTA Medications: Prescriptions prior to admission  Medication Sig Dispense Refill Last Dose  . FLUoxetine (PROZAC) 40 MG capsule Take 1 capsule (40 mg total) by mouth at bedtime. 2 capsule 0 Past Week at Unknown time  . traZODone (DESYREL) 50 MG tablet Take 1-2 tablets (50-100 mg total) by mouth at bedtime. (Patient taking differently: Take 100 mg by mouth at bedtime. ) 4 tablet 0 Past Week at Unknown time  . hydrOXYzine (ATARAX/VISTARIL) 25 MG tablet Take 1 tablet (25 mg total) by mouth every 6 (six) hours as needed for anxiety. (Patient not taking: Reported on 05/15/2014) 4 tablet 0 Not Taking at Unknown time   Musculoskeletal: Strength & Muscle Tone: within normal limits Gait & Station: normal Patient leans: N/A  Psychiatric Specialty Exam: Physical Exam  Constitutional: He is oriented to person, place, and time. He appears  well-developed.  Thin frame   HENT:  Head: Normocephalic.  Eyes: Pupils are equal, round, and reactive to light.  Neck: Normal range of motion.  Cardiovascular: Normal rate.   Respiratory: Effort normal.  GI: Soft.  Genitourinary:  Denies any issues in this area  Musculoskeletal: Normal range of motion.  Neurological: He is alert and oriented to person, place, and time.  Skin: Skin is warm and dry.  Psychiatric: His speech is normal and behavior is normal. Thought content normal. Anxious: Rates #9. His affect is not angry, not blunt, not labile and not inappropriate. Cognition and memory are normal. He expresses impulsivity. He exhibits a depressed mood (Rates #5 ).    Review of Systems  Constitutional: Positive for chills, weight loss, malaise/fatigue and diaphoresis.  HENT: Negative.   Eyes: Negative.   Respiratory: Negative.   Cardiovascular: Negative.   Gastrointestinal: Positive for nausea.  Genitourinary: Negative.   Musculoskeletal: Negative.   Skin: Negative.   Neurological: Positive for weakness.  Endo/Heme/Allergies: Negative.   Psychiatric/Behavioral: Positive for depression (Rates #6), memory loss and substance abuse (Alcoholism, chronic). Negative for suicidal ideas and hallucinations. The patient is nervous/anxious (Rates #9) and has insomnia.     Blood pressure 109/82, pulse 126, temperature 98.3 F (36.8 C), temperature source Oral, resp. rate 20, height 6' 1"  (1.854 m), weight 68.493 kg (151 lb), SpO2 98 %.Body mass index is 19.93 kg/(m^2).  General Appearance: Disheveled  Eye Contact::  Good  Speech:  Clear and Coherent and Normal Rate  Volume:  Normal  Mood:  Depressed, anxious: Rates depression #5 & Anxiety #9  Affect:  Depressed and Flat  Thought Process:  Coherent and Logical  Orientation:  Full (Time, Place, and Person)  Thought Content:  Constant racing thoughts  Suicidal Thoughts:  Denies, hx suicide attempt, attempted to jump off of a bridge   Homicidal Thoughts:  No  Memory:  Immediate;   Good Recent;   Fair Remote;   Fair  Judgement:  Fair  Insight:  Present  Psychomotor Activity:  Restlessness and Tremor  Concentration:  Fair  Recall:  [employer] of Knowledge:Fair  Language: Good  Akathisia:  No  Handed:  Right  AIMS (if indicated):     Assets:  Communication Skills Desire for Improvement  ADL's:  Intact  Cognition: WNL  Sleep:  Number of Hours: 6.5   Treatment Plan Summary: Daily contact with patient to assess and evaluate symptoms and progress in treatment and Medication management: 1. Admit for crisis management and stabilization, estimated length of stay 3-5 days.  2. Medication management to reduce current symptoms to base line and improve the patient's overall level of functioning; Fluoxetine 40 mg for depression, Gabapentin 200 mg for agitation, Ativan detox protocols, Nicotine patch for Nicotine withdrawal, Trazodone 100 mg for insomnia 3. Treat health problems as indicated.  4. Develop treatment plan to decrease risk of relapse upon discharge and the need for readmission.  5. Psycho-social education regarding relapse prevention and self care.  6. Health care follow up as needed for medical problems.  7. Review, reconcile, and reinstate any pertinent home medications for other health issues where appropriate. 8. Call for consults with hospitalist for any additional specialty patient care services as needed.  Observation Level/Precautions:  15 minute checks  Laboratory:  Per ED BAL 350  Psychotherapy: Group sessions  Medications: Fluoxetine 40 mg for depression, Gabapentin 200 mg for agitation, Ativan detox protocols, Nicotine patch for Nicotine withdrawal, Trazodone 100 mg for insomnia  Consultations: As needed  Discharge Concerns: Maintaining sobriety, mood stability   Estimated LOS: 3-5 days  Other: admit to [id number]   I certify that inpatient services furnished can reasonably be expected to improve  the patient's condition.    Lindell Spar I, NP, PMHNP-BC 3/23/201711:12 AM

## 2015-05-31 NOTE — Progress Notes (Addendum)
D: Pt visible in milieu at intervals during shift for meals and medications. Denies SI, HI, AVH when assessed. Reported sleeping fairly with low energy and poor concentration level. Pt rated his depression , hopelessness and anxiety level 9/10 on self inventory sheet with racing thoughts. Stated his goal for today is to "get my head leveled". Visible tremors observed to bilateral hands when assessed for CIWA,reported cold sweats and chills as well. Compliant with medications as ordered.  A: PRN medication offered by Clinical research associatewriter for c/o body aches but pt declined, stated "It will get better in a couple of days, I've been through it before". [facility]. Encouragement offered towards treatment compliance including group attendance. Support and availability provided to pt this shift. Q 15 minutes checks maintained for safety.  R: Pt is receptive to care. Denies adverse drug reactions when assessed. Reported feeling "okay" after taking his scheduled Ativan, sleeping and taking a shower. POC continues.

## 2015-05-31 NOTE — Progress Notes (Signed)
Adult Psychoeducational Group Note  Date:  05/31/2015 Time:  8:10 PM  Group Topic/Focus:  Wrap-Up Group:   The focus of this group is to help patients review their daily goal of treatment and discuss progress on daily workbooks.  Participation Level:  Did Not Attend  Pt was asleep during wrap-up group.    Cleotilde NeerJasmine S Anahid Eskelson 05/31/2015, 8:44 PM

## 2015-06-01 LAB — TSH: TSH: 1.965 u[IU]/mL (ref 0.350–4.500)

## 2015-06-01 NOTE — Progress Notes (Signed)
Adult Psychoeducational Group Note  Date:  06/01/2015 Time:  10:06 PM  Group Topic/Focus:  Wrap-Up Group:   The focus of this group is to help patients review their daily goal of treatment and discuss progress on daily workbooks.  Participation Level:  Active  Participation Quality:  Appropriate  Affect:  Appropriate  Cognitive:  Appropriate  Insight: Appropriate  Engagement in Group:  Engaged  Modes of Intervention:  Socialization and Support  Additional Comments:  Patient attended and participated in group tonight. He reports having a good day. There was lots of conflict on the unit , however, he was able to get through the day. Today her had a visit from his roommate which went well.   Lita MainsFrancis, Tinsley Everman [facility] 06/01/2015, 10:06 PM

## 2015-06-01 NOTE — Progress Notes (Signed)
[facility] MD Progress Note  06/01/2015 2:52 PM Thomas Atkins  MRN:  [id number] Subjective:  Patient states that he is back on his meds and seems to be working.  He states that he was on for 3 years and got off and started drinking again.   Objective:  Thomas Atkins is a 40 year old Caucasian male. Admitted to this [facility] hospital from the [facility] ED with complaints of alcohol intoxication & worsening symptoms of depression. He is requesting help with alcohol detox & substance abuse treatment.  Denies ADR's.  States he had recently lost weight, 13 lbs.    Principal Problem: MDD (major depressive disorder), recurrent severe, without psychosis (HCC) Diagnosis:   Patient Active Problem List   Diagnosis Date Noted  . MDD (major depressive disorder), recurrent severe, without psychosis (HCC) [F33.2] 05/31/2015  . Alcohol use disorder, severe, dependence (HCC) [F10.20] 05/30/2015   Total Time spent with patient: 30 minutes  Past Psychiatric History: see above noted  Past Medical History:  Past Medical History  Diagnosis Date  . Syncope   . Seizure disorder (HCC)   . Alcoholism (HCC)   . Depression   . Anxiety     Past Surgical History  Procedure Laterality Date  . Surgery scrotal / testicular     Family History:  Family History  Problem Relation Age of Onset  . Heart disease Paternal Grandmother   . Bipolar disorder Father    Family Psychiatric  History:  Father bipolar Social History:  History  Alcohol Use  . 7.2 oz/week  . 12 Cans of beer per week    Comment: 200oz/day      History  Drug Use  . Yes  . Special: Marijuana    Social History   Social History  . Marital Status: Divorced    Spouse Name: N/A  . Number of Children: 2  . Years of Education: N/A   Occupational History  . Unemployed    Social History Main Topics  . Smoking status: Current Every Day Smoker -- 0.50 packs/day    Types: Cigarettes  . Smokeless tobacco: None  . Alcohol Use: 7.2 oz/week    12 Cans  of beer per week     Comment: 200oz/day   . Drug Use: Yes    Special: Marijuana  . Sexual Activity: Not Asked   Other Topics Concern  . None   Social History Narrative   Additional Social History:     Sleep: Good  Appetite:  Good  Current Medications: Current Facility-Administered Medications  Medication Dose Route Frequency Provider Last Rate Last Dose  . acetaminophen (TYLENOL) tablet 650 mg  650 mg Oral Q6H PRN Earney Navy, NP      . alum & mag hydroxide-simeth (MAALOX/MYLANTA) 200-200-20 MG/5ML suspension 30 mL  30 mL Oral Q4H PRN Earney Navy, NP      . feeding supplement (ENSURE ENLIVE) (ENSURE ENLIVE) liquid 237 mL  237 mL Oral BID BM Saramma Eappen, MD   237 mL at 06/01/15 1331  . FLUoxetine (PROZAC) capsule 40 mg  40 mg Oral Daily Jomarie Longs, MD   40 mg at 06/01/15 0859  . gabapentin (NEURONTIN) capsule 200 mg  200 mg Oral [id number] Saramma Eappen, MD   200 mg at 06/01/15 0859  . hydrOXYzine (ATARAX/VISTARIL) tablet 25 mg  25 mg Oral Q6H PRN Adonis Brook, NP      . loperamide (IMODIUM) capsule 2-4 mg  2-4 mg Oral PRN Adonis Brook, NP      .  LORazepam (ATIVAN) tablet 1 mg  1 mg Oral Q6H PRN Adonis Brook, NP      . LORazepam (ATIVAN) tablet 1 mg  1 mg Oral TID Adonis Brook, NP   1 mg at 06/01/15 1208   Followed by  . [START ON 06/02/2015] LORazepam (ATIVAN) tablet 1 mg  1 mg Oral BID Adonis Brook, NP       Followed by  . [START ON 06/03/2015] LORazepam (ATIVAN) tablet 1 mg  1 mg Oral Daily Adonis Brook, NP      . magnesium hydroxide (MILK OF MAGNESIA) suspension 30 mL  30 mL Oral Daily PRN Earney Navy, NP      . multivitamin with minerals tablet 1 tablet  1 tablet Oral Daily Adonis Brook, NP   1 tablet at 06/01/15 0858  . nicotine (NICODERM CQ - dosed in mg/24 hours) patch 21 mg  21 mg Transdermal Daily Saramma Eappen, MD   21 mg at 06/01/15 0800  . ondansetron (ZOFRAN-ODT) disintegrating tablet 4 mg  4 mg Oral Q6H PRN Adonis Brook,  NP   4 mg at 05/31/15 2134  . thiamine (VITAMIN B-1) tablet 100 mg  100 mg Oral Daily Adonis Brook, NP   100 mg at 06/01/15 0859  . traZODone (DESYREL) tablet 100 mg  100 mg Oral QHS,MR X 1 Kerry Hough, PA-C   100 mg at 05/31/15 2133    Lab Results:  Results for orders placed or performed during the hospital encounter of 05/30/15 (from the past 48 hour(s))  TSH     Status: None   Collection Time: 06/01/15  6:17 AM  Result Value Ref Range   TSH 1.965 0.350 - 4.500 uIU/mL    Comment: Performed at [facility]    Blood Alcohol level:  Lab Results  Component Value Date   [facility] 350* 05/29/2015   ETH <11 12/01/2013    Physical Findings: AIMS: Facial and Oral Movements Muscles of Facial Expression: None, normal Lips and Perioral Area: None, normal Jaw: None, normal Tongue: None, normal,Extremity Movements Upper (arms, wrists, hands, fingers): None, normal Lower (legs, knees, ankles, toes): None, normal, Trunk Movements Neck, shoulders, hips: None, normal, Overall Severity Severity of abnormal movements (highest score from questions above): None, normal Incapacitation due to abnormal movements: None, normal Patient's awareness of abnormal movements (rate only patient's report): No Awareness, Dental Status Current problems with teeth and/or dentures?: No Does patient usually wear dentures?: No  CIWA:  CIWA-Ar Total: 2 COWS:     Musculoskeletal: Strength & Muscle Tone: within normal limits Gait & Station: normal Patient leans: NA  Psychiatric Specialty Exam: Review of Systems  Psychiatric/Behavioral: Positive for depression (improving) and substance abuse. Negative for hallucinations. The patient is nervous/anxious.   All other systems reviewed and are negative.   Blood pressure 106/75, pulse 63, temperature 98.2 F (36.8 C), temperature source Oral, resp. rate 17, height  (1.854 m), weight 68.493 kg (151 lb), SpO2 98 %.Body mass index is 19.93  kg/(m^2).   General Appearance: Disheveled  Eye Contact:: Good  Speech: Clear and Coherent and Normal Rate  Volume: Normal  Mood: Depressed, anxious:   Affect: Depressed and Flat  Thought Process: Coherent and Logical  Orientation: Full (Time, Place, and Person)  Thought Content: Constant racing thoughts  Suicidal Thoughts: Denies, hx suicide attempt  Homicidal Thoughts: No  Memory: Immediate; Good Recent; Fair Remote; Fair  Judgement: Fair  Insight: Present  Psychomotor Activity: Restlessness and Tremor  Concentration: Fair  Recall: [employer]  Fund of Knowledge:Fair  Language: Good  Akathisia: No  Handed: Right  AIMS (if indicated):    Assets: Communication Skills Desire for Improvement  ADL's: Intact  Cognition: WNL  Sleep: Number of Hours: 6.75       Treatment Plan Summary: Daily contact with patient to assess and evaluate symptoms and progress in treatment and Medication management: 1. Admit for crisis management and stabilization, estimated length of stay 3-5 days.  2. Medication management to reduce current symptoms to base line and improve the patient's overall level of functioning; Fluoxetine 40 mg for depression, Gabapentin 200 mg for agitation, Ativan detox protocols, Nicotine patch for Nicotine withdrawal, Trazodone 100 mg for insomnia 3. Treat health problems as indicated.  4. Develop treatment plan to decrease risk of relapse upon discharge and the need for readmission.  5. Psycho-social education regarding relapse prevention and self care.  6. Health care follow up as needed for medical problems.  7. Review, reconcile, and reinstate any pertinent home medications for other health issues where appropriate. 8. Call for consults with hospitalist for any additional specialty patient care services as needed. 9.  Ordered nutrition consult.   Lindwood QuaSheila May Agustin, NP [facility] 06/01/2015, 2:52 PM Agree with NP Progress  Note as above  Nehemiah MassedFernando Cobos, MD

## 2015-06-01 NOTE — [facility] Suicide Risk Assessment (Signed)
[facility] INPATIENT:  Family/Significant Other Suicide Prevention Education  Suicide Prevention Education:  Patient Refusal for Family/Significant Other Suicide Prevention Education: The patient Thomas Atkins has refused to provide written consent for family/significant other to be provided Family/Significant Other Suicide Prevention Education during admission and/or prior to discharge.  Physician notified.  Thomas Atkins 06/01/2015, 5:01 PM

## 2015-06-01 NOTE — Plan of Care (Signed)
Problem: Diagnosis: Increased Risk For Suicide Attempt Goal: STG-Patient Will Comply With Medication Regime Outcome: Progressing Pt compliant with medication regime     

## 2015-06-01 NOTE — Progress Notes (Signed)
Patient ID: Thomas Atkins, male   DOB: Aug 09, 1975, 40 y.o.   MRN: [id number] D: Patient alert and cooperative watching TV and interacting well with peers. Pt detoxing from EOTH c/o tremors, nausea, and anxiety. Denies  SI/HI/AVH and pain.No behavioral issues noted.  A: Support and encouragement offered as needed. Medications administered as prescribed.  R: Patient remains safe and compliant with medications.Will continue to monitor patient for safety and stability.

## 2015-06-01 NOTE — [facility] Group Notes (Signed)
[facility] LCSW Group Therapy   06/01/2015 1:54 PM  Type of Therapy: Group Therapy  Participation Level:  Invited. Chose not to attend.   Thomas Atkins 06/01/2015 1:54 PM

## 2015-06-01 NOTE — Progress Notes (Signed)
DAR NOTE: Patient presents with anxious affect and depressed mood.  Denies pain, auditory and visual hallucinations.  Rates depression at 4, hopelessness at 3, and anxiety at 5.  Reports withdrawal symptoms of tremors, chilling, nausea and runny nose.  Maintained on routine safety checks.  Medications given as prescribed.  Support and encouragement offered as needed.  Attended group and participated.  States goal for today is "relax, learn about mental and addition."  Patient observed socializing with peers in the dayroom.  Complain of too much stimulation and noise on the unit.

## 2015-06-01 NOTE — [facility] Group Notes (Signed)
[facility]   06/01/2015 11:45 AM  Participation Quality: Active  Mood/Affect:  Appropriate  Depression Rating: Denies   Anxiety Rating: "Going Down"  Thoughts of Suicide:  No Will you contract for safety?   NA  Current AVH:  No  Plan for Discharge/Comments: Pt c/o racing thoughts and states that he is under a lot of pressure because he has a lot of responsibilities back home. Pt also reports that his withdrawal sx are decreasing. Will follow up with [facility]. Not interested in rehab at this time.  Transportation Means: Family will transport  Supports: Family and mental health providers   Thomas JordanLynn B Bryant

## 2015-06-02 DIAGNOSIS — F3289 Other specified depressive episodes: Secondary | ICD-10-CM

## 2015-06-02 DIAGNOSIS — F1024 Alcohol dependence with alcohol-induced mood disorder: Secondary | ICD-10-CM

## 2015-06-02 MED ORDER — PNEUMOCOCCAL VAC POLYVALENT 25 MCG/0.5ML IJ INJ: 0.5000 mL | INJECTION | INTRAMUSCULAR | Status: DC

## 2015-06-02 MED ORDER — GUAIFENESIN ER 600 MG PO TB12
600.0000 mg | ORAL_TABLET | Freq: Two times a day (BID) | ORAL | Status: DC | PRN
  Administered 2015-06-02: 600 mg via ORAL
  Filled 2015-06-02: qty 1

## 2015-06-02 NOTE — Progress Notes (Signed)
Patient ID: Thomas Atkins, male   DOB: Jan 25, 1976, 40 y.o.   MRN: [id number]   D: Pt has been appropriate on the unit today, pt attended all groups and engaged in treatment. Pt reported that his depression was a 1, his hopelessness was a 1, and his anxiety was a 5. Pt reported that his goal for today was to be patient. Pt reported that he was having some withdrawal symptoms to include tremors and runny nose. Pt reported that he wanted his pneumococcal vaccine and medication for congestion. Dr. Gilmore LarocheAkhtar made aware, new orders noted. Pt reported being negative SI/HI, no AH/VH noted. A: 15 min checks continued for patient safety. R: Pt safety maintained.

## 2015-06-02 NOTE — Progress Notes (Signed)
D: Patient pleasant and cooperative with care this shift. Pt out in the milieu interacting well with peers/staff. Pt participated in group session. A: Q15 minute safety checks, encourage staff/peer interaction, administer medications as ordered. R: Pt denies SI/HI or plans to harm himself. No s/s of distress noted.

## 2015-06-02 NOTE — [facility] Group Notes (Signed)
[facility] Group Notes:  (Clinical Social Work)  06/02/2015  11:15-12:00PM  Summary of Progress/Problems:   Today's process group involved patients discussing their feelings related to being hospitalized, as well as how they can use their present feelings to create a plan for how to avoid future hospitalizations. The patient expressed his primary feeling about being hospitalized is "I was just thinking this morning about how proud I am of myself for making it here. I was in a bad frame of mind, but now I'm feeling better and my mind is clearer."  He was philosophical but also supportive of others throughout group.  He gave a lot of support to others about the idea of pursuing wellness as an active job, talked about his own commitment to going to [employer] [place] activities and what he gets out of that service.  Type of Therapy:  Group Therapy - Process  Participation Level:  Active  Participation Quality:  Attentive, Sharing and Supportive  Affect:  Blunted  Cognitive:  Alert, Appropriate and Oriented  Insight:  Engaged  Engagement in Therapy:  Engaged  Modes of Intervention:  Exploration, Discussion  Ambrose MantleMareida Grossman-Orr, LCSW 06/02/2015, 1:27 PM

## 2015-06-02 NOTE — Progress Notes (Signed)
Patient ID: Thomas Atkins, male   DOB: [birth year], 40 y.o.   MRN: [id number] [facility] MD Progress Note  06/02/2015 12:39 PM Thomas Atkins  MRN:  [id number] Subjective: doing better. Slept fair. No significant withdrawals complaint. Mood is better.  Objective:  Thomas Atkins is a 40 year old Caucasian male. Admitted to this [facility] hospital from the [facility] ED with complaints of alcohol intoxication & worsening symptoms of depression. Continues to be on detox and handling it reasonable. Mood and energy improving. No significant anxiety symptoms    Principal Problem: MDD (major depressive disorder), recurrent severe, without psychosis (HCC) Diagnosis:   Patient Active Problem List   Diagnosis Date Noted  . MDD (major depressive disorder), recurrent severe, without psychosis (HCC) [F33.2] 05/31/2015  . Alcohol use disorder, severe, dependence (HCC) [F10.20] 05/30/2015   Total Time spent with patient: 30 minutes  Past Psychiatric History: see above noted  Past Medical History:  Past Medical History  Diagnosis Date  . Syncope   . Seizure disorder (HCC)   . Alcoholism (HCC)   . Depression   . Anxiety     Past Surgical History  Procedure Laterality Date  . Surgery scrotal / testicular     Family History:  Family History  Problem Relation Age of Onset  . Heart disease Paternal Grandmother   . Bipolar disorder Father    Family Psychiatric  History:  Father bipolar Social History:  History  Alcohol Use  . 7.2 oz/week  . 12 Cans of beer per week    Comment: 200oz/day      History  Drug Use  . Yes  . Special: Marijuana    Social History   Social History  . Marital Status: Divorced    Spouse Name: N/A  . Number of Children: 2  . Years of Education: N/A   Occupational History  . Unemployed    Social History Main Topics  . Smoking status: Current Every Day Smoker -- 0.50 packs/day    Types: Cigarettes  . Smokeless tobacco: None  . Alcohol Use: 7.2 oz/week    12 Cans of beer  per week     Comment: 200oz/day   . Drug Use: Yes    Special: Marijuana  . Sexual Activity: Not Asked   Other Topics Concern  . None   Social History Narrative   Additional Social History:     Sleep: Good  Appetite:  Good  Current Medications: Current Facility-Administered Medications  Medication Dose Route Frequency Provider Last Rate Last Dose  . acetaminophen (TYLENOL) tablet 650 mg  650 mg Oral Q6H PRN Earney Navy, NP      . alum & mag hydroxide-simeth (MAALOX/MYLANTA) 200-200-20 MG/5ML suspension 30 mL  30 mL Oral Q4H PRN Earney Navy, NP      . feeding supplement (ENSURE ENLIVE) (ENSURE ENLIVE) liquid 237 mL  237 mL Oral BID BM Saramma Eappen, MD   237 mL at 06/02/15 0748  . FLUoxetine (PROZAC) capsule 40 mg  40 mg Oral Daily Jomarie Longs, MD   40 mg at 06/01/15 0859  . gabapentin (NEURONTIN) capsule 200 mg  200 mg Oral [id number] Saramma Eappen, MD   200 mg at 06/02/15 0745  . hydrOXYzine (ATARAX/VISTARIL) tablet 25 mg  25 mg Oral Q6H PRN Adonis Brook, NP      . loperamide (IMODIUM) capsule 2-4 mg  2-4 mg Oral PRN Adonis Brook, NP      . LORazepam (ATIVAN) tablet 1 mg  1  mg Oral Q6H PRN Adonis BrookSheila Agustin, NP      . LORazepam (ATIVAN) tablet 1 mg  1 mg Oral BID Adonis BrookSheila Agustin, NP   1 mg at 06/02/15 0746   Followed by  . [START ON 06/03/2015] LORazepam (ATIVAN) tablet 1 mg  1 mg Oral Daily Adonis BrookSheila Agustin, NP      . magnesium hydroxide (MILK OF MAGNESIA) suspension 30 mL  30 mL Oral Daily PRN Earney NavyJosephine C Onuoha, NP      . multivitamin with minerals tablet 1 tablet  1 tablet Oral Daily Adonis BrookSheila Agustin, NP   1 tablet at 06/02/15 0745  . nicotine (NICODERM CQ - dosed in mg/24 hours) patch 21 mg  21 mg Transdermal Daily Jomarie LongsSaramma Eappen, MD   21 mg at 06/02/15 0745  . ondansetron (ZOFRAN-ODT) disintegrating tablet 4 mg  4 mg Oral Q6H PRN Adonis BrookSheila Agustin, NP   4 mg at 05/31/15 2134  . thiamine (VITAMIN B-1) tablet 100 mg  100 mg Oral Daily Adonis BrookSheila Agustin, NP   100 mg at  06/02/15 0745  . traZODone (DESYREL) tablet 100 mg  100 mg Oral QHS,MR X 1 Kerry HoughSpencer E Simon, PA-C   100 mg at 06/01/15 2126    Lab Results:  Results for orders placed or performed during the hospital encounter of 05/30/15 (from the past 48 hour(s))  TSH     Status: None   Collection Time: 06/01/15  6:17 AM  Result Value Ref Range   TSH 1.965 0.350 - 4.500 uIU/mL    Comment[facility]    Blood Alcohol level:  Lab Results  Component Value Date   [facility]   ETH <11 12/01/2013    Physical Findings: AIMS: Facial and Oral Movements Muscles of Facial Expression: None, normal Lips and Perioral Area: None, normal Jaw: None, normal Tongue: None, normal,Extremity Movements Upper (arms, wrists, hands, fingers): None, normal Lower (legs, knees, ankles, toes): None, normal, Trunk Movements Neck, shoulders, hips: None, normal, Overall Severity Severity of abnormal movements (highest score from questions above): None, normal Incapacitation due to abnormal movements: None, normal Patient's awareness of abnormal movements (rate only patient's report): No Awareness, Dental Status Current problems with teeth and/or dentures?: No Does patient usually wear dentures?: No  CIWA:  CIWA-Ar Total: 2 COWS:     Musculoskeletal: Strength & Muscle Tone: within normal limits Gait & Station: normal Patient leans: NA  Psychiatric Specialty Exam: Review of Systems  Constitutional: Negative for fever.  Psychiatric/Behavioral: Positive for substance abuse. Negative for hallucinations. Depression: improving. The patient is nervous/anxious.   All other systems reviewed and are negative.   Blood pressure 134/110, pulse 120, temperature 98 F (36.7 C), temperature source Oral, resp. rate 18, height 6\' 1"  (1.854 m), weight 68.493 kg (151 lb), SpO2 98 %.Body mass index is 19.93 kg/(m^2).   General Appearance: casual  Eye Contact:: Good  Speech: Clear and  Coherent and Normal Rate  Volume: Normal  Mood: Depressed somewhat  Affect: Depressed and Flat  Thought Process: Coherent and Logical  Orientation: Full (Time, Place, and Person)  Thought Content: less racing toughts  Suicidal Thoughts: Denies, hx suicide attempt  Homicidal Thoughts: No  Memory: Immediate; Good Recent; Fair Remote; Fair  Judgement: Fair  Insight: Present  Psychomotor Activity: Restlessness and Tremor  Concentration: Fair  Recall: [employer]  Fund of Knowledge:Fair  Language: Good  Akathisia: No  Handed: Right  AIMS (if indicated):    Assets: Communication Skills Desire for Improvement  ADL's:  Intact  Cognition: WNL  Sleep: Number of Hours: 6.75       Treatment Plan Summary: 1. Continue to monitor for withdrawals 2. Medication management to reduce current symptoms to base line and improve the patient's overall level of functioning; Fluoxetine 40 mg for depression, Gabapentin 200 mg for agitation, Ativan detox protocols, Nicotine patch for Nicotine withdrawal, Trazodone 100 mg for insomnia 3. Treat health problems as indicated.  4. Develop treatment plan to decrease risk of relapse upon discharge and the need for readmission.  5. Psycho-social education regarding relapse prevention and self care.  6. Health care follow up as needed for medical problems.  7. Review, reconcile, and reinstate any pertinent home medications for other health issues where appropriate. 8. Call for consults with hospitalist for any additional specialty patient care services as needed.  Thresa Ross, MD  06/02/2015, 12:39 PM

## 2015-06-02 NOTE — Progress Notes (Signed)
Adult Psychoeducational Group Note  Date:  06/02/2015 Time:  9:52 PM  Group Topic/Focus:  Wrap-Up Group:   The focus of this group is to help patients review their daily goal of treatment and discuss progress on daily workbooks.  Participation Level:  Active  Participation Quality:  Appropriate  Affect:  Appropriate  Cognitive:  Alert  Insight: Appropriate  Engagement in Group:  Engaged  Modes of Intervention:  Discussion  Additional Comments:  Pt stated that he had a productive day. He is happy about gaining some weight. Pt also stated that he talked with family and enjoyed the group. His goal for tomorrow is to eat, sleep, and watch basketball.  Kaleen OdeaCOOKE, Ramonte Mena R 06/02/2015, 9:52 PM

## 2015-06-02 NOTE — Progress Notes (Signed)
Patient ID: Thomas Atkins, male   DOB: Jan 29, 1976, 40 y.o.   MRN: [id number] D: Patient alert and cooperative watching TV and interacting well with peers. Pt reports decrease in withdrawal symptoms. Denies SI/HI/AVH and pain.Pt attended and engaged in evening wrap up group. No behavioral issues noted.  A: Support and encouragement offered as needed. Medications administered as prescribed.  R: Patient remains safe and compliant with medications.Will continue to monitor patient for safety and stability.

## 2015-06-03 NOTE — [facility] Group Notes (Signed)
[facility] Group Notes:  (Clinical Social Work)  06/03/2015  11:00AM-12:00PM  Summary of Progress/Problems:  The main focus of today's process group was to listen to a variety of genres of music and to identify that different types of music provoke different responses.  The patient then was able to identify personally what was soothing for them, as well as energizing, as well as how patient can personally use this knowledge in sleep habits, with depression, and with other symptoms.  The patient expressed at the beginning of group the overall feeling of "good but scared about leaving tomorrow."  He participated well in group until he left early, did not return.  Type of Therapy:  Music Therapy   Participation Level:  Active  Participation Quality:  Attentive and Sharing  Affect:  Blunted  Cognitive:  Oriented  Insight:  Engaged  Engagement in Therapy:  Engaged  Modes of Intervention:   Activity, Exploration  Ambrose MantleMareida Grossman-Orr, LCSW 06/03/2015

## 2015-06-03 NOTE — Progress Notes (Signed)
Patient ID: Thomas Atkins, male   DOB: January 16, 1976, 40 y.o.   MRN: [id number] [facility] MD Progress Note  06/03/2015 12:22 PM Thomas Atkins  MRN:  [id number] Subjective: sleep, energy and mood better. Tolerating detox.  Objective:  Thomas Atkins is a 40 year old Caucasian male. Admitted to this [facility] hospital from the [facility] ED with complaints of alcohol intoxication & worsening symptoms of depression. Continues to be on detox and handling it reasonable. Mood and energy improving. No significant anxiety symptoms   Says about ready for discharge by tomorrow. Vitals stable. Insight improving  Principal Problem: MDD (major depressive disorder), recurrent severe, without psychosis (HCC) Diagnosis:   Patient Active Problem List   Diagnosis Date Noted  . MDD (major depressive disorder), recurrent severe, without psychosis (HCC) [F33.2] 05/31/2015  . Alcohol use disorder, severe, dependence (HCC) [F10.20] 05/30/2015   Total Time spent with patient: 30 minutes  Past Psychiatric History: see above noted  Past Medical History:  Past Medical History  Diagnosis Date  . Syncope   . Seizure disorder (HCC)   . Alcoholism (HCC)   . Depression   . Anxiety     Past Surgical History  Procedure Laterality Date  . Surgery scrotal / testicular     Family History:  Family History  Problem Relation Age of Onset  . Heart disease Paternal Grandmother   . Bipolar disorder Father    Family Psychiatric  History:  Father bipolar Social History:  History  Alcohol Use  . 7.2 oz/week  . 12 Cans of beer per week    Comment: 200oz/day      History  Drug Use  . Yes  . Special: Marijuana    Social History   Social History  . Marital Status: Divorced    Spouse Name: N/A  . Number of Children: 2  . Years of Education: N/A   Occupational History  . Unemployed    Social History Main Topics  . Smoking status: Current Every Day Smoker -- 0.50 packs/day    Types: Cigarettes  . Smokeless tobacco: None  .  Alcohol Use: 7.2 oz/week    12 Cans of beer per week     Comment: 200oz/day   . Drug Use: Yes    Special: Marijuana  . Sexual Activity: Not Asked   Other Topics Concern  . None   Social History Narrative   Additional Social History:     Sleep: Good  Appetite:  Good  Current Medications: Current Facility-Administered Medications  Medication Dose Route Frequency Provider Last Rate Last Dose  . acetaminophen (TYLENOL) tablet 650 mg  650 mg Oral Q6H PRN Earney Navy, NP      . alum & mag hydroxide-simeth (MAALOX/MYLANTA) 200-200-20 MG/5ML suspension 30 mL  30 mL Oral Q4H PRN Earney Navy, NP      . feeding supplement (ENSURE ENLIVE) (ENSURE ENLIVE) liquid 237 mL  237 mL Oral BID BM Saramma Eappen, MD   237 mL at 06/03/15 0931  . FLUoxetine (PROZAC) capsule 40 mg  40 mg Oral Daily Jomarie Longs, MD   40 mg at 06/02/15 2143  . gabapentin (NEURONTIN) capsule 200 mg  200 mg Oral [id number] Jomarie Longs, MD   200 mg at 06/03/15 0755  . guaiFENesin (MUCINEX) 12 hr tablet 600 mg  600 mg Oral BID PRN Thresa Ross, MD   600 mg at 06/02/15 1847  . magnesium hydroxide (MILK OF MAGNESIA) suspension 30 mL  30 mL Oral Daily PRN  Earney Navy, NP      . multivitamin with minerals tablet 1 tablet  1 tablet Oral Daily Adonis Brook, NP   1 tablet at 06/03/15 0755  . nicotine (NICODERM CQ - dosed in mg/24 hours) patch 21 mg  21 mg Transdermal Daily Jomarie Longs, MD   21 mg at 06/03/15 0755  . pneumococcal 23 valent vaccine (PNU-IMMUNE) injection 0.5 mL  0.5 mL Intramuscular Tomorrow-1000 Thresa Ross, MD   0.5 mL at 06/03/15 0938  . thiamine (VITAMIN B-1) tablet 100 mg  100 mg Oral Daily Adonis Brook, NP   100 mg at 06/03/15 0753  . traZODone (DESYREL) tablet 100 mg  100 mg Oral QHS,MR X 1 Kerry Hough, PA-C   100 mg at 06/02/15 2143    Lab Results:  No results found for this or any previous visit (from the past 48 hour(s)).  Blood Alcohol level:  Lab Results   Component Value Date   [facility] 350* 05/29/2015   ETH <11 12/01/2013    Physical Findings: AIMS: Facial and Oral Movements Muscles of Facial Expression: None, normal Lips and Perioral Area: None, normal Jaw: None, normal Tongue: None, normal,Extremity Movements Upper (arms, wrists, hands, fingers): None, normal Lower (legs, knees, ankles, toes): None, normal, Trunk Movements Neck, shoulders, hips: None, normal, Overall Severity Severity of abnormal movements (highest score from questions above): None, normal Incapacitation due to abnormal movements: None, normal Patient's awareness of abnormal movements (rate only patient's report): No Awareness, Dental Status Current problems with teeth and/or dentures?: No Does patient usually wear dentures?: No  CIWA:  CIWA-Ar Total: 1 COWS:     Musculoskeletal: Strength & Muscle Tone: within normal limits Gait & Station: normal Patient leans: NA  Psychiatric Specialty Exam: Review of Systems  Constitutional: Negative for fever.  Psychiatric/Behavioral: Positive for substance abuse. Negative for hallucinations. Depression: improving.  All other systems reviewed and are negative.   Blood pressure 109/82, pulse 113, temperature 97.6 F (36.4 C), temperature source Oral, resp. rate 16, height  (1.854 m), weight 68.493 kg (151 lb), SpO2 98 %.Body mass index is 19.93 kg/(m^2).   General Appearance: casual  Eye Contact:: Good  Speech: Clear and Coherent and Normal Rate  Volume: Normal  Mood: euthymic  Affect:  Flat  Thought Process: Coherent and Logical  Orientation: Full (Time, Place, and Person)  Thought Content: less racing toughts  Suicidal Thoughts: Denies, hx suicide attempt  Homicidal Thoughts: No  Memory: Immediate; Good Recent; Fair Remote; Fair  Judgement: Fair  Insight: Present  Psychomotor Activity: Restlessness and Tremor  Concentration: Fair  Recall: [employer] of Knowledge:Fair   Language: Good  Akathisia: No  Handed: Right  AIMS (if indicated):    Assets: Communication Skills Desire for Improvement  ADL's: Intact  Cognition: WNL  Sleep: Number of Hours: 6.75       Treatment Plan Summary: 1. Continue to monitor for withdrawals 2. Medication management to reduce current symptoms to base line and improve the patient's overall level of functioning; Fluoxetine 40 mg for depression, Gabapentin 200 mg for agitation, Ativan detox protocols, Nicotine patch for Nicotine withdrawal, Trazodone 100 mg for insomnia 3. Treat health problems as indicated.  4. Develop treatment plan to decrease risk of relapse upon discharge and the need for readmission.  5. Psycho-social education regarding relapse prevention and self care.  6. Health care follow up as needed for medical problems.  7. Review, reconcile, and reinstate any pertinent home medications for other health issues where  appropriate. 8. Call for consults with hospitalist for any additional specialty patient care services as needed. Possible discharge Cathleen Fearsmonday Soha Thorup, Harrietta GuardianNADEEM, MD  06/03/2015, 12:22 PM

## 2015-06-03 NOTE — Progress Notes (Signed)
D: Thomas Atkins has been easygoing and pleasant this a.m. He denies SI/AVH/HI and pain but admits anxiety and some lightheadedness. On his self inventory, he rated his depression 1, hopelessness 1, and anxiety 6. He reported feeling comfortable with a planned move to the 400 hall. He's looking forward to his expected discharged Monday. A: Meds given as ordered. Held his a.m. Prozac, which he prefers to take at hs. Also did not administer a scheduled pneumonia vaccination, which records indicate he received in 2014. Q15 safety checks maintained. Support/encouragement offered. R: Pt remains free from harm and continues with treatment. Will continue to monitor for needs/safety.

## 2015-06-03 NOTE — Progress Notes (Signed)
D.  Pt pleasant on approach, denies complaints at this time.  Positive for evening wrap up group, interacting appropriately with peers on the unit.  Denies SI/HI/hallucinations at this time.  A.  Support and encouragement offered, medications given as ordered  R.  Pt remains safe on the unit, will continue to monitor.

## 2015-06-04 MED ORDER — HYDROXYZINE HCL 25 MG PO TABS
25.0000 mg | ORAL_TABLET | Freq: Four times a day (QID) | ORAL | Status: DC | PRN
  Filled 2015-06-04: qty 10

## 2015-06-04 MED ORDER — NICOTINE 21 MG/24HR TD PT24: 21.0000 mg | MEDICATED_PATCH | Freq: Every day | TRANSDERMAL | Status: DC

## 2015-06-04 MED ORDER — TRAZODONE HCL 100 MG PO TABS: 100.0000 mg | ORAL_TABLET | Freq: Every evening | ORAL | Status: DC | PRN

## 2015-06-04 MED ORDER — HYDROXYZINE HCL 25 MG PO TABS: 25.0000 mg | ORAL_TABLET | Freq: Four times a day (QID) | ORAL | Status: DC | PRN

## 2015-06-04 MED ORDER — GABAPENTIN 100 MG PO CAPS: 200.0000 mg | ORAL_CAPSULE | ORAL | Status: DC

## 2015-06-04 MED ORDER — FLUOXETINE HCL 40 MG PO CAPS: 40.0000 mg | ORAL_CAPSULE | Freq: Every day | ORAL | Status: DC

## 2015-06-04 NOTE — Progress Notes (Signed)
NUTRITION NOTE  Consult for pt who has lost 13 lbs recently. This would indicate 8% wt loss in unknown time frame which is likely significant as it has been "recent." Pt is ordered Ensure BID at this time and eating as desired on Regular diet. Pt attended wrap-up group yesterday.  No further nutrition-related interventions warranted at this time. If further nutrition needs arise please re-consult.     Trenton GammonJessica Tawona Filsinger, RD, LDN Inpatient Clinical Dietitian Pager # [phone] After hours/weekend pager # [phone]

## 2015-06-04 NOTE — Discharge Summary (Signed)
Physician Discharge Summary Note  Patient:  Thomas Atkins is an 40 y.o., male MRN:  [id number] DOB:  1975/11/28 Patient phone:  There is no home phone number on file.  Patient address:   [address] [place] [address],  Total Time spent with patient: Greater than 30 minutes  Date of Admission:  05/30/2015 Date of Discharge: 06-04-15  Reason for Admission: Alcohol intoxication/worsening symptoms of depression  Principal Problem: MDD (major depressive disorder), recurrent severe, without psychosis [facility])  Discharge Diagnoses: Patient Active Problem List   Diagnosis Date Noted  . MDD (major depressive disorder), recurrent severe, without psychosis (HCC) [F33.2] 05/31/2015  . Alcohol use disorder, severe, dependence (HCC) [F10.20] 05/30/2015   Past Psychiatric History: Alcoholism, chronic, Major depression  Past Medical History:  Past Medical History  Diagnosis Date  . Syncope   . Seizure disorder (HCC)   . Alcoholism (HCC)   . Depression   . Anxiety     Past Surgical History  Procedure Laterality Date  . Surgery scrotal / testicular     Family History:  Family History  Problem Relation Age of Onset  . Heart disease Paternal Grandmother   . Bipolar disorder Father    Family Psychiatric  History: See H&P  Social History:  History  Alcohol Use  . 7.2 oz/week  . 12 Cans of beer per week    Comment: 200oz/day      History  Drug Use  . Yes  . Special: Marijuana    Social History   Social History  . Marital Status: Divorced    Spouse Name: N/A  . Number of Children: 2  . Years of Education: N/A   Occupational History  . Unemployed    Social History Main Topics  . Smoking status: Current Every Day Smoker -- 0.50 packs/day    Types: Cigarettes  . Smokeless tobacco: None  . Alcohol Use: 7.2 oz/week    12 Cans of beer per week     Comment: 200oz/day   . Drug Use: Yes    Special: Marijuana  . Sexual Activity: Not Asked   Other Topics Concern   . None   Social History Narrative   Hospital Course:  Jeremiah is a 40 year old Caucasian male. Admitted to this [facility] hospital from the [facility] ED with complaints of alcohol intoxication & worsening symptoms of depression. He is requesting help with alcohol detox & substance abuse treatment after discharge. During this assessment, Lorence reports, "The ambulance took me to the Hospital yesterday. I had walked to the fire station near my home intoxicated & they called the ambulance for me. I just need help with my alcoholism. I need clean time again. I cannot do it on my own. I have been drinking heavily for the last 6 months, lots of beer. I drink as soon I woke up in the morning, I drink through the afternoon, I drink prior to going to bed. When I drink, it will make me feel sick. I have been forgetful, not knowing how I got home, how much I spent, unable to keep my words with people because I forgot that I made a promise to someone. I have been an alcoholic x 30 years. My longest sobriety was for 190 days when I was in jail 5 years ago. I'm depressed because I'm drinking, I'm drinking because I'm depressed. I have bad criminal record. I have had daily blackouts, alcohol related withdrawal seizures & my mind is constantly racing.  I would want to participate in an outpatient treatment program after detoxification". Denies any other drug use.  [name] was admitted to the hospital a BAL 350 per toxicology tests results. He admits having been drinking a lot & it has worsened. He was also presenting with worsening symptoms of depression, possibly, substance induced. He was here for alcohol detoxification/mood stabilization treatments. Alexios's recent lab reports indicated elevated liver enzymes (AST & ALT), possibly from chronic alcoholism. As a result, not a candidate for Librium detoxification treatment protocols. This is because Librium is a long acting Benzodiazepine with a long half-life, not suitable for a  compromised liver enzymes. His detoxification treatment was achieved using Ativan detox regimen on a tapering dose format. By using Ativan detox regimen, Quentin received a cleaner detoxification treatment without the lingering effects of the Librium capsules in his system. He was enrolled in the group counseling sessions, AA/NA meetings being offered and held on this unit. He participated and learned coping skills.   Besides the detoxification treatments, [name] was also medicated & discharged on; Trazodone 100 mg for insomnia, Gabapentin 100 mg for agitation, Fluoxetine 40 mg for depression & Hydroxyzine 25 mg prn for anxiety. He tolerated his treatment regimen without any significant adverse effects and or reactions. [name] has completed detox treatment & his mood is stable. This is evidenced by his reports of improved mood & absence of substance withdrawal symptoms. He will resume psychiatric care & routine medication management at [facility] here in [place], [place]. He was encouraged to join/attend AA/NA meetings being offered and held within his community to achieve & maintain maximum sobriety.   Upon discharge, he adamantly denies any suicidal, homicidal ideations, auditory, visual hallucinations, delusional thoughts, paranoia & or withdrawal symptoms. [facility] with all personal belongings in no apparent distress. He received a 7 days worth supply samples of his [facility] discharge medications [facility]. Transportation per mother.  Physical Findings: AIMS: Facial and Oral Movements Muscles of Facial Expression: None, normal Lips and Perioral Area: None, normal Jaw: None, normal Tongue: None, normal,Extremity Movements Upper (arms, wrists, hands, fingers): None, normal Lower (legs, knees, ankles, toes): None, normal, Trunk Movements Neck, shoulders, hips: None, normal, Overall Severity Severity of abnormal movements (highest score from questions above): None, normal Incapacitation  due to abnormal movements: None, normal Patient's awareness of abnormal movements (rate only patient's report): No Awareness, Dental Status Current problems with teeth and/or dentures?: No Does patient usually wear dentures?: No  CIWA:  CIWA-Ar Total: 0 COWS:     Musculoskeletal: Strength & Muscle Tone: within normal limits Gait & Station: normal Patient leans: N/A  Psychiatric Specialty Exam: Review of Systems  Constitutional: Negative.   HENT: Negative.   Eyes: Negative.   Respiratory: Negative.   Cardiovascular: Negative.   Gastrointestinal: Negative.   Genitourinary: Negative.   Musculoskeletal: Negative.   Skin: Negative.   Neurological: Negative.   Endo/Heme/Allergies: Negative.   Psychiatric/Behavioral: Positive for depression (Stable) and substance abuse (Hx. Alcoholism, chronic). Negative for suicidal ideas, hallucinations and memory loss. The patient has insomnia (Stable). The patient is not nervous/anxious.     Blood pressure 108/84, pulse 122, temperature 97.8 F (36.6 C), temperature source Oral, resp. rate 18, height 6\' 1"  (1.854 m), weight 68.493 kg (151 lb), SpO2 98 %.Body mass index is 19.93 kg/(m^2).  See Md's SRA   Have you used any form of tobacco in the last 30 days? (Cigarettes, Smokeless Tobacco, Cigars, and/or Pipes): Yes  Has this patient used  any form of tobacco in the last 30 days? (Cigarettes, Smokeless Tobacco, Cigars, and/or Pipes) Yes, Yes, A prescription for an FDA-approved tobacco cessation medication was offered at discharge and the patient refused  Blood Alcohol level:  Lab Results  Component Value Date   ETH 350* 05/29/2015   ETH <11 12/01/2013   Metabolic Disorder Labs:  No results found for: HGBA1C, MPG No results found for: PROLACTIN No results found for: CHOL, TRIG, HDL, CHOLHDL, VLDL, LDLCALC  See Psychiatric Specialty Exam and Suicide Risk Assessment completed by Attending Physician prior to discharge.  Discharge destination:   Home  Is patient on multiple antipsychotic therapies at discharge:  No   Has Patient had three or more failed trials of antipsychotic monotherapy by history:  No  Recommended Plan for Multiple Antipsychotic Therapies: NA    Medication List    TAKE these medications      Indication   FLUoxetine 40 MG capsule  Commonly known as:  PROZAC  Take 1 capsule (40 mg total) by mouth daily. For depression   Indication:  Major Depressive Disorder     gabapentin 100 MG capsule  Commonly known as:  NEURONTIN  Take 2 capsules (200 mg total) by mouth 3 (three) times daily at 8am, 3pm and bedtime. For agitation/substance withdrawal syndrome   Indication:  Agitation, Alcohol Withdrawal Syndrome     hydrOXYzine 25 MG tablet  Commonly known as:  ATARAX/VISTARIL  Take 1 tablet (25 mg total) by mouth every 6 (six) hours as needed for anxiety.   Indication:  Anxiety     nicotine 21 mg/24hr patch  Commonly known as:  NICODERM CQ - dosed in mg/24 hours  Place 1 patch (21 mg total) onto the skin daily. For Nicotine addiction   Indication:  Nicotine Addiction     traZODone 100 MG tablet  Commonly known as:  DESYREL  Take 1 tablet (100 mg total) by mouth at bedtime and may repeat dose one time if needed. For insomnia   Indication:  Trouble Sleeping       Follow-up Information    Follow up with [facility] On 06/06/2015.   Specialty:  Behavioral Health   Why:  at 1:45pm for medication management. Please ask to be scheduled for therapy at this appointment.   [address] [place] [address] [phone]      Follow-up recommendations: Activity:  As tolerated Diet: As recommended by your primary care doctor. Keep all scheduled follow-up appointments as recommended.   Comments: Take all your medications as prescribed by your mental healthcare provider. Report any adverse effects and or reactions from your medicines to your outpatient provider promptly. Patient is  instructed and cautioned to not engage in alcohol and or illegal drug use while on prescription medicines. In the event of worsening symptoms, patient is instructed to call the crisis hotline, 911 and or go to the nearest ED for appropriate evaluation and treatment of symptoms. Follow-up with your primary care provider for your other medical issues, concerns and or health care needs.   Signed: Sanjuana Kava, NP, PMHNP, FNP-BC 06/04/2015, 11:12 AM  Patient seen, Suicide Assessment Completed.  Disposition Plan Reviewed

## 2015-06-04 NOTE — Progress Notes (Signed)
Recreation Therapy Notes  Date: 03.27.2017 Time: 9:30am Location: [address] Hall Group Room   Group Topic: Stress Management  Goal Area(s) Addresses:  Patient will actively participate in stress management techniques presented during session.   Behavioral Response: Did not attend.   Marykay Lexenise L Prajwal Fellner, LRT/CTRS         Jearl KlinefelterBlanchfield, Angeliki Mates L 06/04/2015 12:57 PM

## 2015-06-04 NOTE — Tx Team (Signed)
Interdisciplinary Treatment Plan Update (Adult)  Date:  06/04/2015 Time Reviewed:  10:05 AM  Progress in Treatment: Attending groups: Yes. Participating in groups: Yes. Taking medication as prescribed:  Yes. Tolerating medication:  Yes. Family/Significant othe contact made:  No, Pt declines Patient understands diagnosis:  Yes, as evidenced by seeking help with drinking and depressive sx. Discussing patient identified problems/goals with staff:  Yes, see initial care plan. Medical problems stabilized or resolved:  Yes Denies suicidal/homicidal ideation: Yes. Issues/concerns per patient self-inventory: No. Other:  New problem(s) identified:   Discharge Plan or Barriers: See below  Reason for Continuation of Hospitalization: Depression Medication stabilization Suicidal ideation Withdrawal symptoms  Comments: Thomas Atkins is a 40 year old Caucasian male. Admitted to this [facility] hospital from the [facility] ED with complaints of alcohol intoxication & worsening symptoms of depression. He is requesting help with alcohol detox & substance abuse treatment after discharge. During this assessment, Thomas Atkins reports, "The ambulance took me to the Hospital yesterday. I had walked to the fire station near my home intoxicated & they called the ambulance for me. I just need help with my alcoholism. I need clean time again. I cannot do it on my own. I have been drinking heavily for the last 6 months, lots of beer. I drink as soon I woke up in the morning, I drink through the afternoon, I drink prior to going to bed. When I drink, it will make me feel sick. I have been forgetful, not knowing how I got home, how much I spent, unable to keep my words with people because I forgot that I made a promise to someone. I have been an alcoholic x 30 years. My longest sobriety was for 190 days when I was in jail 5 years ago. I'm depressed because I'm drinking, I'm drinking because I'm depressed. I have bad criminal record.  I have had daily blackouts, alcohol related withdrawal seizures & my mind is constantly racing. I would want to participate in an outpatient treatment program after detoxification". Denies any other drug use. Prozac, Neurontin  Estimated length of stay: 0 days  New goal(s):  Review of initial/current patient goals per problem list:  1. Goal(s): Patient will participate in aftercare plan  Met: Yes   Target date: at discharge  As evidenced by: Patient will participate within aftercare plan AEB aftercare provider and housing plan at discharge being identified.  05/31/15: Pt will return home with partner and follow up oupt with [facility].  2. Goal (s): Patient will exhibit decreased depressive symptoms and suicidal ideations.  Met: Yes  Target date: at discharge  As evidenced by: Patient will utilize self rating of depression at 3 or below and demonstrate decreased signs of depression or be deemed stable for discharge by MD.  05/31/15: Pt endorses depressive sx. 06/04/15: Pt denies SI and rates depression at 0  5. Goal(s): Patient will demonstrate decreased signs of withdrawal due to substance abuse   Met: Yes   Target date: 3-5 days post admission date   As evidenced by: Patient will produce a CIWA/COWS score of 0, have stable vitals signs, and no symptoms of withdrawal  05/31/15: Pt has a CIWA score of 7 today.  06/04/15: Pt CIWA score is 0; denies symptoms of withdrawal.    Attendees: Patient:  06/04/2015 10:05 AM  Family:   06/04/2015 10:05 AM  Physician:  Dr. Ursula Alert, MD 06/04/2015 10:05 AM  Nursing: Micheline Chapman., RN  06/04/2015 10:05 AM  Case Manager:  Peri Maris, LCSWA 06/04/2015 10:05 AM  Counselor:   06/04/2015 10:05 AM  Other:   06/04/2015 10:05 AM  Other:   06/04/2015 10:05 AM  Other:   06/04/2015 10:05 AM  Other:  06/04/2015 10:05 AM  Other:    Other:    Other:    Other:    Other:    Other:      Scribe for Treatment Team:   Peri Maris, [place] Work [phone]

## 2015-06-04 NOTE — Progress Notes (Signed)
  [facility] Discharge [facility] :  Will you be returning to the same living situation after discharge:  Yes,  Pt returning home At discharge, do you have transportation home?: Yes,  mother to pick up Do you have the ability to pay for your medications: Yes,  Pt provided with supply and prescriptions  Release of information consent forms completed and in the chart;  Patient's signature needed at discharge.  Patient to Follow up at: Follow-up Information    [facility] On 06/06/2015.   Specialty:  Behavioral Health   Why:  at 1:45pm for medication management. Please ask to be scheduled for therapy at this appointment.   Contact information:   [address] [place] [address] [phone]       Next level of care provider has access to [facility] Link:no  Safety Planning and Suicide Prevention discussed: Yes,  with Pt; declined family contact  Have you used any form of tobacco in the last 30 days? (Cigarettes, Smokeless Tobacco, Cigars, and/or Pipes): Yes  Has patient been referred to the Quitline?: Patient refused referral  Patient has been referred for addiction treatment: Yes- see above  Elaina HoopsCarter, Keary Hanak M 06/04/2015, 10:03 AM

## 2015-06-04 NOTE — BHH Suicide Risk Assessment (Signed)
[facility] Discharge Suicide Risk Assessment   Principal Problem: MDD (major depressive disorder), recurrent severe, without psychosis (HCC) Discharge Diagnoses:  Patient Active Problem List   Diagnosis Date Noted  . MDD (major depressive disorder), recurrent severe, without psychosis (HCC) [F33.2] 05/31/2015  . Alcohol use disorder, severe, dependence (HCC) [F10.20] 05/30/2015    Total Time spent with patient: 30 minutes  Musculoskeletal: Strength & Muscle Tone: within normal limits- no current tremors, no diaphoresis, no restlessness  Gait & Station: normal Patient leans: N/A  Psychiatric Specialty Exam: ROS  Blood pressure 108/84, pulse 122, temperature 97.8 F (36.6 C), temperature source Oral, resp. rate 18, height  (1.854 m), weight 151 lb (68.493 kg), SpO2 98 %.Body mass index is 19.93 kg/(m^2).  General Appearance: Well Groomed  Eye Contact::  Good  Speech:  Normal Rate409  Volume:  Normal  Mood:  improved mood, denies any significant depression at this time   Affect:  Appropriate and reactive   Thought Process:  Linear  Orientation:  Full (Time, Place, and Person)  Thought Content:  denies hallucinations, no delusions, not internally preoccupied   Suicidal Thoughts:  No- denies any hallucinations, no delusions, not internally preoccupied   Homicidal Thoughts:  No  Memory:  recent and remote grossly intact   Judgement:  Other:  improving   Insight:  improving   Psychomotor Activity:  Normal- no tremors or restlessness / no agitation   Concentration:  Good  Recall:  Good  Fund of Knowledge:Good  Language: Good  Akathisia:  Negative  Handed:  Right  AIMS (if indicated):     Assets:  Desire for Improvement Resilience Social Support  Sleep:  Number of Hours: 5.5  Cognition: WNL  ADL's:  Intact   Mental Status Per Nursing Assessment::   On Admission:  NA  Demographic Factors:  40 year old male, lives with SO   Loss Factors: Relapse on alcohol  Historical  Factors: Alcohol Dependence, history of depression   Risk Reduction Factors:   Sense of responsibility to family, Employed, Living with another person, especially a relative and Positive coping skills or problem solving skills  Continued Clinical Symptoms:  At this time patient is alert, attentive, calm, pleasant, not presenting with  symptoms of alcohol WDL at this time , calm, in no acute distress or discomfort. He denies depression at this time and presents euthymic, with full range of affect, no thought disorder, no SI or HI, no psychotic symptoms, future oriented, 0x3. Denies medication side effects. Denies any lingering alcohol WDL and states " I feel fine today".  Cognitive Features That Contribute To Risk:  No gross cognitive deficits noted upon discharge. Is alert , attentive, and oriented x 3   Suicide Risk:  Mild:  Suicidal ideation of limited frequency, intensity, duration, and specificity.  There are no identifiable plans, no associated intent, mild dysphoria and related symptoms, good self-control (both objective and subjective assessment), few other risk factors, and identifiable protective factors, including available and accessible social support.  Follow-up Information    Follow up with [facility] On 06/06/2015.   Specialty:  Behavioral Health   Why:  at 1:45pm for medication management. Please ask to be scheduled for therapy at this appointment.   Contact information:   [address] [place] [address] [phone]       Plan Of Care/Follow-up recommendations:  Activity:  as tolerated  Diet:  Regular Tests:  NA Other:  see below  Patient is requesting discharge and at this  time there are no grounds for involuntary commitment . He is leaving unit in good spirits, plans to return home. Plans to follow up as above, and plans to go to SMART Recovery meetings, which he states have been the most helpful for him Expresses high level of motivation in maintaining  sobriety.  Nehemiah MassedOBOS, Nija Koopman, MD 06/04/2015, 12:18 PM

## 2015-06-04 NOTE — Plan of Care (Signed)
Problem: Diagnosis: Increased Risk For Suicide Attempt Goal: LTG-Patient Will Show Positive Response to Medication LTG (by discharge) : Patient will show positive response to medication and will participate in the development of the discharge plan.  Outcome: Progressing Patient compliant with medication; he understands dosage and indications of each.

## 2015-06-04 NOTE — Progress Notes (Signed)
Discharge note:  Patient discharged home per MD order.  Patient will follow up with [facility].  Patient received all personal belongings from unit and locker. Reviewed discharge instructions and medications.  He received prescriptions and medication samples.  He denies SI/HI/AVH.  He left in good spirits.  Patient left ambulatory with his mother.

## 2015-07-25 ENCOUNTER — Encounter (HOSPITAL_COMMUNITY): Payer: Self-pay | Admitting: *Deleted

## 2015-07-25 ENCOUNTER — Emergency Department (HOSPITAL_COMMUNITY)
Admission: EM | Admit: 2015-07-25 | Discharge: 2015-07-25 | Disposition: A | Discharge status: Home / Self Care | Payer: Self-pay | Attending: Emergency Medicine | Admitting: Emergency Medicine

## 2015-07-25 DIAGNOSIS — G40909 Epilepsy, unspecified, not intractable, without status epilepticus: Secondary | ICD-10-CM | POA: Insufficient documentation

## 2015-07-25 DIAGNOSIS — F329 Major depressive disorder, single episode, unspecified: Secondary | ICD-10-CM | POA: Insufficient documentation

## 2015-07-25 DIAGNOSIS — Z79899 Other long term (current) drug therapy: Secondary | ICD-10-CM | POA: Insufficient documentation

## 2015-07-25 DIAGNOSIS — Y929 Unspecified place or not applicable: Secondary | ICD-10-CM | POA: Insufficient documentation

## 2015-07-25 DIAGNOSIS — Y999 Unspecified external cause status: Secondary | ICD-10-CM | POA: Insufficient documentation

## 2015-07-25 DIAGNOSIS — F10920 Alcohol use, unspecified with intoxication, uncomplicated: Secondary | ICD-10-CM

## 2015-07-25 DIAGNOSIS — F10120 Alcohol abuse with intoxication, uncomplicated: Secondary | ICD-10-CM | POA: Insufficient documentation

## 2015-07-25 DIAGNOSIS — F17210 Nicotine dependence, cigarettes, uncomplicated: Secondary | ICD-10-CM | POA: Insufficient documentation

## 2015-07-25 DIAGNOSIS — Y939 Activity, unspecified: Secondary | ICD-10-CM | POA: Insufficient documentation

## 2015-07-25 DIAGNOSIS — S60221A Contusion of right hand, initial encounter: Secondary | ICD-10-CM | POA: Insufficient documentation
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
## 2015-07-25 NOTE — Discharge Instructions (Signed)
Alcohol Intoxication Alcohol intoxication occurs when you drink enough alcohol that it affects your ability to function. It can be mild or very severe. Drinking a lot of alcohol in a short time is called binge drinking. This can be very harmful. Drinking alcohol can also be more dangerous if you are taking medicines or other drugs. Some of the effects caused by alcohol may include:  Loss of coordination.  Changes in mood and behavior.  Unclear thinking.  Trouble talking (slurred speech).  Throwing up (vomiting).  Confusion.  Slowed breathing.  Twitching and shaking (seizures).  Loss of consciousness. HOME CARE  Do not drive after drinking alcohol.  Drink enough water and fluids to keep your pee (urine) clear or pale yellow. Avoid caffeine.  Only take medicine as told by your doctor. GET HELP IF:  You throw up (vomit) many times.  You do not feel better after a few days.  You frequently have alcohol intoxication. Your doctor can help decide if you should see a substance use treatment counselor. GET HELP RIGHT AWAY IF:  You become shaky when you stop drinking.  You have twitching and shaking.  You throw up blood. It may look bright red or like coffee grounds.  You notice blood in your poop (bowel movements).  You become lightheaded or pass out (faint). MAKE SURE YOU:   Understand these instructions.  Will watch your condition.  Will get help right away if you are not doing well or get worse.   This information is not intended to replace advice given to you by your health care provider. Make sure you discuss any questions you have with your health care provider.   Document Released: 08/13/2007 Document Revised: 10/27/2012 Document Reviewed: 07/30/2012 Elsevier Interactive Patient Education 2016 [employer]  [employer]Community Resource Guide Outpatient Counseling/Substance Abuse Adult The United Ways 211 is a great source of information about community services  available.  Access by dialing 2-1-1 from anywhere in [place], or by website -  PooledIncome.plwww.nc211.org.   Other Local Resources (Updated 03/2015)  Crisis Hotlines   Services     Area Served  [facility]  Crisis Hotline, available 24 hours a day, 7 days a week: [phone] [phone] [facility], [place]   [facility]  Crisis Hotline, available 24 hours a day, 7 days a week: [phone] [facility], [place]  [facility]  Suicide Prevention Hotline, available 24 hours a day, 7 days a week: 6131861468 [facility], [place]  [facility]   Crisis Hotline, available 24 hours a day, 7 days a week: [phone] [facility], [place]   [facility] Access to [employer] Line  Crisis Hotline, available 24 hours a day, 7 days a week: 303-332-0692773 636 4567 All   [facility]  Crisis Hotline, available 24 hours a day, 7 days a week: [phone] All   Other Local Resources (Updated 03/2015)  Outpatient Counseling/ Substance Abuse Programs  Services     Address and Phone Number  ADS (Alcohol and Drug Services)   Options include Individual counseling, group counseling, intensive outpatient program (several hours a day, several days a week)  Offers depression assessments  Provides methadone maintenance program [phone] [address], [address] [place], [place] [address]   [facility]   Offers partial hospitalization/day treatment and DUI/DWI programs  [employer]s Medicare, private insurance [phone] [address], [address] [place], [place] [address]  [facility]    Services include intensive outpatient program (several hours a day, several days a week), outpatient treatment, DUI/DWI services, family  Also has some services specifically for Veterans °• Offers transitional housing  [phone] °[address] °[place], [place] [address] °  °  °[facility] • Accepts Medicare, private pay, and private insurance  [phone] °[address] °[place], [place] [address]  °[facility] • Services include individual counseling, substance abuse intensive outpatient program (several hours a day, several days a week), day treatment °• Accepts Medicare, Medicaid, private insurance [phone] °[address] °[place], [place] [address]  °[facility] ° • Offers substance abuse intensive outpatient program (several hours a day, several days a week), partial hospitalization program 336-832-9800 °[address] °[place], [place] [address] ° °[phone] °[address] °[place], [place] [address] ° °[phone] °[address] °[place], [place] [address] ° °[phone] °[address] °, [place] [address]  °[facility] • Individual counseling only °• Accepts private insurance only [phone] °[address] °[place], [place] [address]  °[facility] • Methadone maintenance program [phone] °[address] °[place], [place] [address]  °[facility] • Walk-In Clinic providing substance abuse and mental health counseling °• Accepts Medicaid, Medicare, private insurance °• Offers sliding scale for uninsured 336-342-8316 °[address] °[place], [place]   °[facility] • Offers individual counseling, and intensive in-home services [phone] °[address] °[place], [place] [address]  °[facility] • Offers individual counseling, family counseling, group therapy, domestic violence counseling, consumer credit counseling °• Accepts Medicare, Medicaid, private insurance °• Offers sliding scale for uninsured 336-387-6161 °[address] °[place], [place] [address] ° °336-889-6161 °[place], [address] °[place], [place] [address]  °[facility] • Offers individual, family and group counseling °• 3 locations - [place], [place], and  [place] ° 336-899-8800 ° °[address] °[place], [place] [address] ° °[address] °[place], [place] [address] ° °[address] °[place], [place] [address]  °[facility]  ° • Offers psychiatric assessment, 8-week Intensive Outpatient Program (several hours a day, several times a week, daytime or evenings), early recovery group, family Program, medication management °• Private pay or private insurance only [phone], or  °[phone] °[address] °[place], [place] [address]  °[facility] • Offers individual, couples and family counseling °• Accepts Medicaid, private insurance, and sliding scale for uninsured 336-542-2076 °[address] °[place], [place] [address]  °David Fuller, MD • Individual counseling °• Private insurance 336-852-4051 °[address] °[place], [place] [address]  °[facility] ° • Offers assessment, substance abuse treatment, and behavioral health treatment [phone] °[address] °[place], [place] [address]  °[facility] • Individual counseling °• Accepts private insurance 336-272-1972 °[address] °[place], [place] [address]  °[facility] • Individual counseling °• Accepts Medicare, private insurance [phone] °[address] °[place], [place] [address]  °[facility]  ° • Offers intensive outpatient program (several hours a day, several times a week) °• Private pay, private insurance [phone] °[address] °[place], [place]  °[facility] • Individual counseling °• Medicare, private insurance 336-505-9494 °[address] °[place], [place] [address]  °[facility]  ° • Offers intensive outpatient program (several hours a day, several times a week) and partial hospitalization program 336-794-3550 °[address] °[place],  [address]  °Parrish McKinney, MD • Individual counseling 336-282-1251 °[address] °  [address] [place], [place] [address]  [facility]  Offers Christian counseling to individuals, couples, and families  Accepts Medicare and private insurance; offers sliding scale for uninsured [phone] [address] [place], [place] [address]  [facility]  [place] [facility] [phone] [address] [place], [place] [address]  [facility] crisis counseling, individual counseling, group therapy, in-home therapy, domestic violence services, day treatment, DWI services, Administrator, arts (CST), Doctor, hospital (ACTT), substance abuse Intensive Outpatient Program (several hours a day, several times a week)  2 locations - [place] and [place] [phone] [address] [place], [place] [address]  [phone] [address] [place], [place] [address]  [facility]     Individual counseling and group therapy  [employer], [place], [place] [phone] [address] [place], [place]  [facility]  Offers individual and family counseling  Offers LGBTQ services  Accepts private insurance and private pay [phone] [address] [place], [place] [address]  [facility]    Offers individual counseling, group therapy, and outpatient detox  Accepts private insurance [phone] [address] [place], [place]  [facility]  Individual counseling  Accepts Medicare, private insurance [phone] [address] [place], [place] [address]  [employer]  Individual counseling  Accepts Medicare, private insurance [phone] [address] [place], [place] [address]  [facility]   Offers substance abuse Intensive Outpatient Program (several hours a day, several times a week) [phone], or [phone] [place], [place]
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
## 2015-07-25 NOTE — ED Notes (Signed)
PA at bedside.

## 2015-08-04 ENCOUNTER — Encounter (HOSPITAL_COMMUNITY): Payer: Self-pay | Admitting: Emergency Medicine

## 2015-08-04 DIAGNOSIS — F17210 Nicotine dependence, cigarettes, uncomplicated: Secondary | ICD-10-CM | POA: Insufficient documentation

## 2015-08-04 DIAGNOSIS — F329 Major depressive disorder, single episode, unspecified: Secondary | ICD-10-CM | POA: Insufficient documentation

## 2015-08-04 DIAGNOSIS — F419 Anxiety disorder, unspecified: Secondary | ICD-10-CM | POA: Insufficient documentation

## 2015-08-04 DIAGNOSIS — Z5321 Procedure and treatment not carried out due to patient leaving prior to being seen by health care provider: Secondary | ICD-10-CM | POA: Insufficient documentation

## 2015-08-04 NOTE — ED Notes (Signed)
Pt states he has been feeling SOB all day, pt states he has anxiety and does not take anything for it.

## 2015-08-05 ENCOUNTER — Emergency Department (HOSPITAL_COMMUNITY)
Admission: EM | Admit: 2015-08-05 | Discharge: 2015-08-05 | Discharge status: Against Medical Advice | Payer: Self-pay | Attending: Dermatology | Admitting: Dermatology

## 2015-08-05 NOTE — ED Notes (Signed)
No answer when pt's name called in the waiting room 

## 2015-08-05 NOTE — ED Notes (Signed)
No answer when pt's name called in the waiting room; pt eloped from the waiting room 

## 2015-10-09 ENCOUNTER — Encounter (HOSPITAL_COMMUNITY): Payer: Self-pay | Admitting: Emergency Medicine

## 2015-10-09 ENCOUNTER — Emergency Department (HOSPITAL_COMMUNITY)
Admission: EM | Admit: 2015-10-09 | Discharge: 2015-10-10 | Disposition: A | Discharge status: Other Acute Inpatient Hospital | Payer: Self-pay | Attending: Emergency Medicine | Admitting: Emergency Medicine

## 2015-10-09 DIAGNOSIS — Z79899 Other long term (current) drug therapy: Secondary | ICD-10-CM | POA: Insufficient documentation

## 2015-10-09 DIAGNOSIS — F10920 Alcohol use, unspecified with intoxication, uncomplicated: Secondary | ICD-10-CM

## 2015-10-09 DIAGNOSIS — F332 Major depressive disorder, recurrent severe without psychotic features: Secondary | ICD-10-CM

## 2015-10-09 DIAGNOSIS — F10120 Alcohol abuse with intoxication, uncomplicated: Secondary | ICD-10-CM | POA: Insufficient documentation

## 2015-10-09 DIAGNOSIS — R45851 Suicidal ideations: Secondary | ICD-10-CM

## 2015-10-09 DIAGNOSIS — F1020 Alcohol dependence, uncomplicated: Secondary | ICD-10-CM

## 2015-10-09 DIAGNOSIS — F1290 Cannabis use, unspecified, uncomplicated: Secondary | ICD-10-CM | POA: Insufficient documentation

## 2015-10-09 DIAGNOSIS — F17210 Nicotine dependence, cigarettes, uncomplicated: Secondary | ICD-10-CM | POA: Insufficient documentation

## 2015-10-09 LAB — COMPREHENSIVE METABOLIC PANEL
ALBUMIN: 5 g/dL (ref 3.5–5.0)
ALT: 49 U/L (ref 17–63)
AST: 73 U/L — AB (ref 15–41)
Alkaline Phosphatase: 57 U/L (ref 38–126)
Anion gap: 10 (ref 5–15)
BUN: 9 mg/dL (ref 6–20)
CHLORIDE: 106 mmol/L (ref 101–111)
CO2: 24 mmol/L (ref 22–32)
CREATININE: 0.77 mg/dL (ref 0.61–1.24)
Calcium: 9 mg/dL (ref 8.9–10.3)
GFR calc Af Amer: >60 mL/min (ref >60)
GFR calc non Af Amer: >60 mL/min (ref >60)
GLUCOSE: 95 mg/dL (ref 65–99)
Potassium: 4.3 mmol/L (ref 3.5–5.1)
SODIUM: 140 mmol/L (ref 135–145)
Total Bilirubin: 0.8 mg/dL (ref 0.3–1.2)
Total Protein: 8.7 g/dL — ABNORMAL HIGH (ref 6.5–8.1)

## 2015-10-09 LAB — RAPID URINE DRUG SCREEN, HOSP PERFORMED
Amphetamines: NOT DETECTED
Barbiturates: NOT DETECTED
Benzodiazepines: NOT DETECTED
COCAINE: NOT DETECTED
OPIATES: NOT DETECTED
TETRAHYDROCANNABINOL: NOT DETECTED

## 2015-10-09 LAB — ETHANOL: ALCOHOL ETHYL (B): 400 mg/dL — AB (ref <5)

## 2015-10-09 LAB — CBC
HCT: 43.2 % (ref 39.0–52.0)
HEMOGLOBIN: 15.1 g/dL (ref 13.0–17.0)
MCH: 33 pg (ref 26.0–34.0)
MCHC: 35 g/dL (ref 30.0–36.0)
MCV: 94.5 fL (ref 78.0–100.0)
PLATELETS: 183 10*3/uL (ref 150–400)
RBC: 4.57 MIL/uL (ref 4.22–5.81)
RDW: 13.9 % (ref 11.5–15.5)
WBC: 7.6 10*3/uL (ref 4.0–10.5)

## 2015-10-09 LAB — SALICYLATE LEVEL

## 2015-10-09 LAB — ACETAMINOPHEN LEVEL

## 2015-10-09 MED ORDER — BENZTROPINE MESYLATE 1 MG PO TABS
0.5000 mg | ORAL_TABLET | Freq: Every day | ORAL | Status: DC
  Administered 2015-10-10 (×2): 0.5 mg via ORAL
  Filled 2015-10-09 (×2): qty 1

## 2015-10-09 MED ORDER — LORAZEPAM 1 MG PO TABS: 0.0000 mg | ORAL_TABLET | Freq: Two times a day (BID) | ORAL | Status: DC

## 2015-10-09 MED ORDER — TRAZODONE HCL 100 MG PO TABS
100.0000 mg | ORAL_TABLET | Freq: Every evening | ORAL | Status: DC | PRN
  Administered 2015-10-10: 100 mg via ORAL
  Filled 2015-10-09: qty 1

## 2015-10-09 MED ORDER — LORAZEPAM 1 MG PO TABS
0.0000 mg | ORAL_TABLET | Freq: Four times a day (QID) | ORAL | Status: DC
  Administered 2015-10-10: 1 mg via ORAL
  Administered 2015-10-10: 2 mg via ORAL
  Filled 2015-10-09: qty 1
  Filled 2015-10-09: qty 2

## 2015-10-09 MED ORDER — HYDROXYZINE HCL 25 MG PO TABS: 25.0000 mg | ORAL_TABLET | Freq: Four times a day (QID) | ORAL | Status: DC | PRN

## 2015-10-09 MED ORDER — FLUOXETINE HCL 20 MG PO CAPS
40.0000 mg | ORAL_CAPSULE | Freq: Every day | ORAL | Status: DC
  Administered 2015-10-10 (×2): 40 mg via ORAL
  Filled 2015-10-09 (×2): qty 2

## 2015-10-09 MED ORDER — GABAPENTIN 100 MG PO CAPS
200.0000 mg | ORAL_CAPSULE | ORAL | Status: DC
  Administered 2015-10-10 (×2): 200 mg via ORAL
  Filled 2015-10-09 (×2): qty 2

## 2015-10-09 MED ORDER — NICOTINE 21 MG/24HR TD PT24
21.0000 mg | MEDICATED_PATCH | Freq: Every day | TRANSDERMAL | Status: DC
  Administered 2015-10-10: 21 mg via TRANSDERMAL
  Filled 2015-10-09 (×2): qty 1

## 2015-10-09 MED ORDER — THIAMINE HCL 100 MG/ML IJ SOLN
100.0000 mg | Freq: Every day | INTRAMUSCULAR | Status: DC
  Filled 2015-10-09: qty 2

## 2015-10-09 MED ORDER — VITAMIN B-1 100 MG PO TABS
100.0000 mg | ORAL_TABLET | Freq: Every day | ORAL | Status: DC
  Administered 2015-10-10 (×2): 100 mg via ORAL
  Filled 2015-10-09 (×2): qty 1

## 2015-10-09 NOTE — ED Triage Notes (Signed)
Pt states that he has become overwhelmed with life and feels like he wants to hang himself from a tree. States that he has felt like this before. Alert and oriented. ETOH. States he has been drinking tonight.

## 2015-10-09 NOTE — ED Provider Notes (Signed)
[facility]-EMERGENCY DEPT Provider Note   CSN: [id number] Arrival date & time: 10/09/15  2206  First Provider Contact:  First MD Initiated Contact with Patient 10/09/15 2258        History   Chief Complaint Chief Complaint  Patient presents with  . Suicidal    HPI Thomas Atkins is a 40 y.o. male.  ppatient presents with suicidal thoughtsthat occurred acutely around 2 PM today. States he has applied what hang himself.He admits to history of depression. He's been drinking alcohol tonight and says he drinks everyday as much as he can. He does have shakiness when he doesn't drink but is never had a seizure. Denies any illicit drug abuse. Denies any hallucinations or homicidal thoughts.Denies any other medical problems. Denies using any IV drugs. States compliance with his medications for depression.Denies abdominal pain, nausea vomiting, cough or shortness of breath.   The history is provided by the patient and a relative.    Past Medical History:  Diagnosis Date  . Alcoholism (HCC)   . Anxiety   . Depression   . Seizure disorder (HCC)   . Syncope     Patient Active Problem List   Diagnosis Date Noted  . MDD (major depressive disorder), recurrent severe, without psychosis (HCC) 05/31/2015  . Alcohol use disorder, severe, dependence (HCC) 05/30/2015    Past Surgical History:  Procedure Laterality Date  . SURGERY SCROTAL / TESTICULAR         Home Medications    Prior to Admission medications   Medication Sig Start Date End Date Taking? Authorizing Provider  benztropine (COGENTIN) 0.5 MG tablet Take 0.5 mg by mouth daily.   Yes Historical Provider, MD  FLUoxetine (PROZAC) 20 MG tablet Take 20 mg by mouth daily.   Yes Historical Provider, MD  traZODone (DESYREL) 100 MG tablet Take 1 tablet (100 mg total) by mouth at bedtime and may repeat dose one time if needed. For insomnia 06/04/15  Yes Sanjuana Kava, NP  FLUoxetine (PROZAC) 40 MG capsule Take 1 capsule (40 mg total) by  mouth daily. For depression Patient not taking: Reported on 10/09/2015 06/04/15   Sanjuana Kava, NP  gabapentin (NEURONTIN) 100 MG capsule Take 2 capsules (200 mg total) by mouth 3 (three) times daily at 8am, 3pm and bedtime. For agitation/substance withdrawal syndrome Patient not taking: Reported on 10/09/2015 06/04/15   Sanjuana Kava, NP  hydrOXYzine (ATARAX/VISTARIL) 25 MG tablet Take 1 tablet (25 mg total) by mouth every 6 (six) hours as needed for anxiety. Patient not taking: Reported on 10/09/2015 06/04/15   Sanjuana Kava, NP  nicotine (NICODERM CQ - DOSED IN MG/24 HOURS) 21 mg/24hr patch Place 1 patch (21 mg total) onto the skin daily. For Nicotine addiction Patient not taking: Reported on 07/25/2015 06/04/15   Sanjuana Kava, NP    Family History Family History  Problem Relation Age of Onset  . Heart disease Paternal Grandmother   . Bipolar disorder Father     Social History Social History  Substance Use Topics  . Smoking status: Current Every Day Smoker    Packs/day: 0.50    Types: Cigarettes  . Smokeless tobacco: Not on file  . Alcohol use 7.2 oz/week    12 Cans of beer per week     Comment: 200oz/day      Allergies   Lactose intolerance (gi) and Ultram [tramadol hcl]   Review of Systems Review of Systems  Constitutional: Negative for activity change, appetite change and fever.  HENT: Negative for congestion.   Eyes: Negative for visual disturbance.  Respiratory: Negative for cough, chest tightness and shortness of breath.   Cardiovascular: Negative for chest pain.  Gastrointestinal: Negative for abdominal pain, nausea and vomiting.  Genitourinary: Negative for dysuria and hematuria.  Musculoskeletal: Negative for arthralgias, myalgias and neck stiffness.  Skin: Negative for color change.  Neurological: Negative for dizziness, weakness, light-headedness and headaches.  Psychiatric/Behavioral: Positive for agitation, confusion, dysphoric mood, self-injury, sleep disturbance  and suicidal ideas. The patient is nervous/anxious.    A complete 10 system review of systems was obtained and all systems are negative except as noted in the HPI and PMH.    Physical Exam Updated Vital Signs BP 138/94 (BP Location: Left Arm)   Pulse 100   Temp 98.1 F (36.7 C) (Oral)   Resp 18   SpO2 97%   Physical Exam  Constitutional: He is oriented to person, place, and time. He appears well-developed and well-nourished. No distress.  intoxicated  HENT:  Head: Normocephalic and atraumatic.  Mouth/Throat: Oropharynx is clear and moist. No oropharyngeal exudate.  Eyes: Conjunctivae and EOM are normal. Pupils are equal, round, and reactive to light.  Neck: Normal range of motion. Neck supple.  No meningismus.  Cardiovascular: Normal rate, regular rhythm, normal heart sounds and intact distal pulses.   No murmur heard. Pulmonary/Chest: Effort normal and breath sounds normal. No respiratory distress.  Abdominal: Soft. There is no tenderness. There is no rebound and no guarding.  Musculoskeletal: Normal range of motion. He exhibits no edema or tenderness.  Neurological: He is alert and oriented to person, place, and time. No cranial nerve deficit. He exhibits normal muscle tone. Coordination normal.  No ataxia on finger to nose bilaterally. No pronator drift. 5/5 strength throughout. CN 2-12 intact.Equal grip strength. Sensation intact.   Skin: Skin is warm. Capillary refill takes less than 2 seconds.  Psychiatric: He has a normal mood and affect. His behavior is normal.  Nursing note and vitals reviewed.    ED Treatments / Results  Labs (all labs ordered are listed, but only abnormal results are displayed) Labs Reviewed  COMPREHENSIVE METABOLIC PANEL - Abnormal; Notable for the following:       Result Value   Total Protein 8.7 (*)    AST 73 (*)    All other components within normal limits  ETHANOL - Abnormal; Notable for the following:    Alcohol, Ethyl (B) 400 (*)     All other components within normal limits  ACETAMINOPHEN LEVEL - Abnormal; Notable for the following:    Acetaminophen (Tylenol), Serum <10 (*)    All other components within normal limits  SALICYLATE LEVEL  CBC  URINE RAPID DRUG SCREEN, HOSP PERFORMED    EKG  EKG Interpretation None       Radiology No results found.  Procedures Procedures (including critical care time)  Medications Ordered in ED Medications  hydrOXYzine (ATARAX/VISTARIL) tablet 25 mg (not administered)  FLUoxetine (PROZAC) capsule 40 mg (40 mg Oral Given 10/10/15 0039)  gabapentin (NEURONTIN) capsule 200 mg (200 mg Oral Given 10/10/15 0038)  nicotine (NICODERM CQ - dosed in mg/24 hours) patch 21 mg (21 mg Transdermal Patch Applied 10/10/15 0038)  traZODone (DESYREL) tablet 100 mg (100 mg Oral Given 10/10/15 0039)  benztropine (COGENTIN) tablet 0.5 mg (0.5 mg Oral Given 10/10/15 0039)  LORazepam (ATIVAN) tablet 0-4 mg (0 mg Oral Not Given 10/10/15 0042)    Followed by  LORazepam (ATIVAN) tablet 0-4 mg (not administered)  thiamine (VITAMIN B-1) tablet 100 mg (100 mg Oral Given 10/10/15 0038)    Or  thiamine (B-1) injection 100 mg ( Intravenous See Alternative 10/10/15 0038)     Initial Impression / Assessment and Plan / ED Course  I have reviewed the triage vital signs and the nursing notes.  Pertinent labs & imaging results that were available during my care of the patient were reviewed by me and considered in my medical decision making (see chart for details).  Clinical Course    Patient presents with alcohol intoxication and suicidal ideation with plan. Vitals are stable. No distress. He is calm and cooperative.  Screening labs show alcohol intoxication. Suicidal with plan to hang self, intoxicated   Holding orders placed. CIWA protocol ordered.  Patient will need to sober and then have psychiatry evaluation. Final Clinical Impressions(s) / ED Diagnoses   Final diagnoses:  Suicidal ideation  Alcohol  intoxication, uncomplicated (HCC)    New Prescriptions New Prescriptions   No medications on file     [name], MD 10/10/15 0117

## 2015-10-09 NOTE — ED Notes (Signed)
Critical ETOH 400.

## 2015-10-10 ENCOUNTER — Encounter (HOSPITAL_COMMUNITY): Payer: Self-pay | Admitting: *Deleted

## 2015-10-10 ENCOUNTER — Inpatient Hospital Stay (HOSPITAL_COMMUNITY)
Admission: EM | Admit: 2015-10-10 | Discharge: 2015-10-15 | DRG: 885 | Disposition: A | Discharge status: Home / Self Care | Payer: Federal, State, Local not specified - Other | Source: Intra-hospital | Attending: Psychiatry | Admitting: Psychiatry

## 2015-10-10 DIAGNOSIS — F332 Major depressive disorder, recurrent severe without psychotic features: Secondary | ICD-10-CM | POA: Diagnosis present

## 2015-10-10 DIAGNOSIS — Z915 Personal history of self-harm: Secondary | ICD-10-CM

## 2015-10-10 DIAGNOSIS — F1020 Alcohol dependence, uncomplicated: Secondary | ICD-10-CM | POA: Diagnosis present

## 2015-10-10 DIAGNOSIS — Y906 Blood alcohol level of 120-199 mg/100 ml: Secondary | ICD-10-CM | POA: Diagnosis present

## 2015-10-10 DIAGNOSIS — F17210 Nicotine dependence, cigarettes, uncomplicated: Secondary | ICD-10-CM | POA: Diagnosis present

## 2015-10-10 LAB — ETHANOL: Alcohol, Ethyl (B): 141 mg/dL — ABNORMAL HIGH (ref <5)

## 2015-10-10 MED ORDER — LORAZEPAM 1 MG PO TABS
1.0000 mg | ORAL_TABLET | Freq: Four times a day (QID) | ORAL | Status: AC | PRN
  Filled 2015-10-10: qty 1

## 2015-10-10 MED ORDER — THIAMINE HCL 100 MG/ML IJ SOLN
100.0000 mg | Freq: Once | INTRAMUSCULAR | Status: AC
  Administered 2015-10-10: 100 mg via INTRAMUSCULAR
  Filled 2015-10-10: qty 2

## 2015-10-10 MED ORDER — LORAZEPAM 1 MG PO TABS: 0.0000 mg | ORAL_TABLET | Freq: Four times a day (QID) | ORAL | Status: DC

## 2015-10-10 MED ORDER — ONDANSETRON 4 MG PO TBDP
4.0000 mg | ORAL_TABLET | Freq: Four times a day (QID) | ORAL | Status: DC | PRN
Start: 2015-10-10 — End: 2015-10-10
  Administered 2015-10-10: 4 mg via ORAL

## 2015-10-10 MED ORDER — ONDANSETRON HCL 4 MG PO TABS: 4.0000 mg | ORAL_TABLET | Freq: Four times a day (QID) | ORAL | Status: DC | PRN

## 2015-10-10 MED ORDER — ACETAMINOPHEN 325 MG PO TABS: 650.0000 mg | ORAL_TABLET | Freq: Four times a day (QID) | ORAL | Status: DC | PRN

## 2015-10-10 MED ORDER — ALUM & MAG HYDROXIDE-SIMETH 200-200-20 MG/5ML PO SUSP: 30.0000 mL | ORAL | Status: DC | PRN

## 2015-10-10 MED ORDER — FLUOXETINE HCL 20 MG PO CAPS
40.0000 mg | ORAL_CAPSULE | Freq: Every day | ORAL | Status: DC
  Administered 2015-10-11: 40 mg via ORAL
  Filled 2015-10-10 (×3): qty 2

## 2015-10-10 MED ORDER — MAGNESIUM HYDROXIDE 400 MG/5ML PO SUSP: 30.0000 mL | Freq: Every day | ORAL | Status: DC | PRN

## 2015-10-10 MED ORDER — LORAZEPAM 1 MG PO TABS
1.0000 mg | ORAL_TABLET | Freq: Three times a day (TID) | ORAL | Status: AC
  Administered 2015-10-12 (×3): 1 mg via ORAL
  Filled 2015-10-10 (×3): qty 1

## 2015-10-10 MED ORDER — VITAMIN B-1 100 MG PO TABS
100.0000 mg | ORAL_TABLET | Freq: Every day | ORAL | Status: DC
  Administered 2015-10-11 – 2015-10-15 (×5): 100 mg via ORAL
  Filled 2015-10-10 (×6): qty 1

## 2015-10-10 MED ORDER — ONDANSETRON 4 MG PO TBDP
4.0000 mg | ORAL_TABLET | Freq: Four times a day (QID) | ORAL | Status: AC | PRN
  Administered 2015-10-12: 4 mg via ORAL
  Filled 2015-10-10: qty 1

## 2015-10-10 MED ORDER — LORAZEPAM 1 MG PO TABS: 0.0000 mg | ORAL_TABLET | Freq: Two times a day (BID) | ORAL | Status: DC

## 2015-10-10 MED ORDER — NICOTINE 21 MG/24HR TD PT24
21.0000 mg | MEDICATED_PATCH | Freq: Every day | TRANSDERMAL | Status: DC
  Administered 2015-10-10 – 2015-10-14 (×5): 21 mg via TRANSDERMAL
  Filled 2015-10-10 (×9): qty 1

## 2015-10-10 MED ORDER — HYDROXYZINE HCL 25 MG PO TABS: 25.0000 mg | ORAL_TABLET | Freq: Four times a day (QID) | ORAL | Status: AC | PRN

## 2015-10-10 MED ORDER — GABAPENTIN 100 MG PO CAPS
200.0000 mg | ORAL_CAPSULE | ORAL | Status: DC
  Administered 2015-10-10 – 2015-10-11 (×3): 200 mg via ORAL
  Filled 2015-10-10 (×6): qty 2

## 2015-10-10 MED ORDER — TRAZODONE HCL 100 MG PO TABS
100.0000 mg | ORAL_TABLET | Freq: Every evening | ORAL | Status: DC | PRN
  Filled 2015-10-10 (×2): qty 1

## 2015-10-10 MED ORDER — ADULT MULTIVITAMIN W/MINERALS CH
1.0000 | ORAL_TABLET | Freq: Every day | ORAL | Status: DC
  Administered 2015-10-10 – 2015-10-15 (×6): 1 via ORAL
  Filled 2015-10-10 (×8): qty 1

## 2015-10-10 MED ORDER — LORAZEPAM 1 MG PO TABS
1.0000 mg | ORAL_TABLET | Freq: Every day | ORAL | Status: AC
  Administered 2015-10-14: 1 mg via ORAL
  Filled 2015-10-10: qty 1

## 2015-10-10 MED ORDER — ONDANSETRON 4 MG PO TBDP
ORAL_TABLET | ORAL | Status: AC
  Filled 2015-10-10: qty 1

## 2015-10-10 MED ORDER — THIAMINE HCL 100 MG/ML IJ SOLN: 100.0000 mg | Freq: Every day | INTRAMUSCULAR | Status: DC

## 2015-10-10 MED ORDER — TRAZODONE HCL 100 MG PO TABS
100.0000 mg | ORAL_TABLET | Freq: Every day | ORAL | Status: DC
  Administered 2015-10-10 – 2015-10-14 (×5): 100 mg via ORAL
  Filled 2015-10-10 (×6): qty 1

## 2015-10-10 MED ORDER — HYDROXYZINE HCL 25 MG PO TABS
25.0000 mg | ORAL_TABLET | Freq: Four times a day (QID) | ORAL | Status: DC | PRN
  Administered 2015-10-10: 25 mg via ORAL
  Filled 2015-10-10: qty 1

## 2015-10-10 MED ORDER — LORAZEPAM 1 MG PO TABS
1.0000 mg | ORAL_TABLET | Freq: Two times a day (BID) | ORAL | Status: AC
  Administered 2015-10-13 (×2): 1 mg via ORAL
  Filled 2015-10-10 (×2): qty 1

## 2015-10-10 MED ORDER — VITAMIN B-1 100 MG PO TABS
100.0000 mg | ORAL_TABLET | Freq: Every day | ORAL | Status: DC
  Filled 2015-10-10 (×2): qty 1

## 2015-10-10 MED ORDER — LOPERAMIDE HCL 2 MG PO CAPS: 2.0000 mg | ORAL_CAPSULE | ORAL | Status: AC | PRN

## 2015-10-10 MED ORDER — BENZTROPINE MESYLATE 0.5 MG PO TABS
0.5000 mg | ORAL_TABLET | Freq: Every day | ORAL | Status: DC
  Administered 2015-10-11: 0.5 mg via ORAL
  Filled 2015-10-10 (×2): qty 1

## 2015-10-10 MED ORDER — LORAZEPAM 1 MG PO TABS
1.0000 mg | ORAL_TABLET | Freq: Four times a day (QID) | ORAL | Status: AC
  Administered 2015-10-10 – 2015-10-11 (×6): 1 mg via ORAL
  Filled 2015-10-10 (×6): qty 1

## 2015-10-10 NOTE — ED Notes (Signed)
TTS consult via telepsych in process at this time. 

## 2015-10-10 NOTE — ED Notes (Signed)
Patient discharged with [facility] to go to [facility].
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
## 2015-10-10 NOTE — Progress Notes (Signed)
Patient ID: Thomas Atkins, male   DOB: 13-May-1975, 40 y.o.   MRN: [id number] D: Client visible on the unit, seen in dayroom watching TV. Client reports goal for today "keep my mouth shut" Client would not elaborate. Visible tremors, client is cooperative. A: Writer encouraged client to verbalize any concerns. Medications reviewed, administered as ordered. Staff will monitor q11min for safety. R: Client is safe on the unit.

## 2015-10-10 NOTE — BH Assessment (Signed)
[facility] Assessment Progress Note  Per Donell Sievert, PA, this pt requires psychiatric hospitalization at this time.  Clint Bolder, RN, [facility] has assigned pt to Rm 307-2; they will be ready to accept pt after 10:00.  Pt has signed Voluntary Admission and Consent for Treatment, as well as Consent to Release Information to [facility], and his outpatient provider, and a notification call has been placed.  Signed forms have been faxed to [facility].  Pt's nurse has been notified, and agrees to send original paperwork along with pt via [facility], and to call report to [phone].  Doylene Canning, MA Triage Specialist [phone]

## 2015-10-10 NOTE — Tx Team (Signed)
Initial Interdisciplinary Treatment Plan   PATIENT STRESSORS: Financial difficulties Legal issue Loss of family members Marital or family conflict Substance abuse   PATIENT STRENGTHS: Average or above average intelligence Communication skills Motivation for treatment/growth Physical Health Supportive family/friends   PROBLEM LIST: Problem List/Patient Goals Date to be addressed Date deferred Reason deferred Estimated date of resolution  At risk for suicide 10/10/2015  10/10/2015   D/C  Substance Abuse 10/10/2015  10/10/2015   D/C  Depression 10/10/2015  10/10/2015   D/C  "Health" 10/10/2015  10/10/2015   D/C  "Learning more about myself" 10/10/2015  10/10/2015   D/C                           DISCHARGE CRITERIA:  Ability to meet basic life and health needs Adequate post-discharge living arrangements Improved stabilization in mood, thinking, and/or behavior Motivation to continue treatment in a less acute level of care Need for constant or close observation no longer present Reduction of life-threatening or endangering symptoms to within safe limits Withdrawal symptoms are absent or subacute and managed without 24-hour nursing intervention  PRELIMINARY DISCHARGE PLAN: Attend 12-step recovery group Outpatient therapy Return to previous living arrangement  PATIENT/FAMIILY INVOLVEMENT: This treatment plan has been presented to and reviewed with the patient, Thomas Atkins.  The patient and family have been given the opportunity to ask questions and make suggestions.  Larry Sierras P 10/10/2015, 1:54 PM

## 2015-10-10 NOTE — [facility] Group Notes (Signed)
[facility] LCSW Group Therapy  10/10/2015 4:03 PM  Type of Therapy:  Group Therapy  Participation Level:  Did Not Attend-new to unit. Resting in room. Invited.   Summary of Progress/Problems: Today's Topic: Overcoming Obstacles. Patients identified one short term goal and potential obstacles in reaching this goal. Patients processed barriers involved in overcoming these obstacles. Patients identified steps necessary for overcoming these obstacles and explored motivation (internal and external) for facing these difficulties head on.   Smart, Emmanuela Ghazi LCSW 10/10/2015, 4:03 PM

## 2015-10-10 NOTE — Progress Notes (Signed)
Pt attended NA group this evening.  
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
## 2015-10-10 NOTE — ED Notes (Signed)
Report given to Clydie Braun at [facility].  [facility] called for transport.

## 2015-10-11 MED ORDER — FLUOXETINE HCL 20 MG PO CAPS
40.0000 mg | ORAL_CAPSULE | Freq: Every day | ORAL | Status: DC
  Administered 2015-10-12 – 2015-10-14 (×3): 40 mg via ORAL
  Filled 2015-10-11 (×5): qty 2

## 2015-10-11 MED ORDER — GABAPENTIN 300 MG PO CAPS
300.0000 mg | ORAL_CAPSULE | ORAL | Status: DC
Start: 2015-10-11 — End: 2015-10-12
  Administered 2015-10-11 – 2015-10-12 (×3): 300 mg via ORAL
  Filled 2015-10-11 (×10): qty 1
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
## 2015-10-11 NOTE — Progress Notes (Signed)
D:Pt has been interacting with peers on the unit. He rates depression as an 8, hopelessness a 2 and anxiety as a 10 on 1-10 scale with 10 being the most. Pt has a moderate tremor and has been given Gatorade with encouragement of fluids throughout the day. He reports that he had nightmares during the night. A:Offered support, encouragement and 15 minute checks. Talked to pt about removing nicotine patch at night. R:Pt denies si and hi. Safety maintained on the unit.

## 2015-10-11 NOTE — Plan of Care (Signed)
Problem: Medication: Goal: Compliance with prescribed medication regimen will improve Outcome: Progressing Client is compliant with medication regime AEB taking medications as prescribed andreporting withdrawal symptoms.

## 2015-10-11 NOTE — Plan of Care (Signed)
Problem: Activity: Goal: Interest or engagement in activities will improve Outcome: Progressing Pt is interacting with peers on the unit.  Problem: Coping: Goal: Ability to demonstrate self-control will improve Outcome: Progressing Pt has demonstrated self control on the unit today with no verbal or physical outbursts.

## 2015-10-11 NOTE — [facility] Group Notes (Signed)
[facility] LCSW Group Therapy  10/11/2015 2:56 PM  Type of Therapy:  Group Therapy  Participation Level:  Active  Participation Quality:  Attentive  Affect:  Appropriate  Cognitive:  Alert and Oriented  Insight:  Improving  Engagement in Therapy:  Engaged  Modes of Intervention:  Discussion, Education, Exploration, Problem-solving, Rapport Building, Socialization and Support  Summary of Progress/Problems:  Finding Balance in Life. Today's group focused on defining balance in one's own words, identifying things that can knock one off balance, and exploring healthy ways to maintain balance in life. Group members were asked to provide an example of a time when they felt off balance, describe how they handled that situation,and process healthier ways to regain balance in the future. Group members were asked to share the most important tool for maintaining balance that they learned while at [facility] and how they plan to apply this method after discharge. Graeson was attentive and engaged during today's processing group. He shared that he feels off balance due to "really bad withdrawals" but feels good about himself for making the decision to "get help." "I've been drinking for too long." Sultan shares that he is making a positive change for himself by moving with his mother instead of going back to his father's home "He's a bad heroin addict and a trigger for me." Tuck stated that "doing peer support at the [facility]" would be an additional resource for him. "I'm looking forward to talking to Yampa about that."   Smart, Alandria Butkiewicz LCSW 10/11/2015, 2:56 PM

## 2015-10-11 NOTE — Plan of Care (Signed)
Problem: Safety: Goal: Ability to remain free from injury will improve Outcome: Progressing Client is safe on the unit AEB q32min safety checks, client reports s/s of withdrawals, contracts for safety.

## 2015-10-11 NOTE — [facility] Group Notes (Signed)
[facility] Group Notes:  (Nursing/MHT/Case Management/Adjunct)  Date:  10/11/2015  Time: 0900  Type of Therapy:  Nurse Education  Participation Level:  Did Not Attend  Participation Quality:    Affect:   Cognitive:    Insight:    Engagement in Group:    Modes of Intervention:    Summary of Progress/Problems:  Thomas Atkins 10/11/2015, 3:37 PM
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
## 2015-10-11 NOTE — Progress Notes (Signed)
Patient ID: Thomas Atkins, male   DOB: 1975/07/30, 40 y.o.   MRN: [id number] D: Client reports "still have shakes, but not as bad" "my goal is to keep my mouth shut and listen to what y'all say" Client was somewhat confused about medications schedule and was demanding Prozac be given tonight. A: Writer reviewed medications seen that Prozac had been a given this am, explained to client it had been given, also contacted pharmacy and received an additional ordered a dose for tonight, but client agreed to wait and take it as scheduled in the am. Staff will monitor q69min for safety. R: client is safe on the unit, did not attend karaoke.

## 2015-10-12 LAB — HEPATIC FUNCTION PANEL
ALBUMIN: 4.8 g/dL (ref 3.5–5.0)
ALT: 80 U/L — ABNORMAL HIGH (ref 17–63)
AST: 89 U/L — ABNORMAL HIGH (ref 15–41)
Alkaline Phosphatase: 60 U/L (ref 38–126)
BILIRUBIN TOTAL: 1 mg/dL (ref 0.3–1.2)
Total Protein: 7.9 g/dL (ref 6.5–8.1)

## 2015-10-12 MED ORDER — GABAPENTIN 400 MG PO CAPS
400.0000 mg | ORAL_CAPSULE | ORAL | Status: DC
  Administered 2015-10-12 – 2015-10-15 (×9): 400 mg via ORAL
  Filled 2015-10-12 (×12): qty 1

## 2015-10-12 NOTE — Progress Notes (Signed)
Patient did not attend the evening karaoke group. Pt was notified that group was beginning but remained on unit.  

## 2015-10-12 NOTE — [facility] Suicide Risk Assessment (Signed)
[facility] INPATIENT:  Family/Significant Other Suicide Prevention Education  Suicide Prevention Education:  Education Completed; Thomas Atkins (pt's mother) [phone] has been identified by the patient as the family member/significant other with whom the patient will be residing, and identified as the person(s) who will aid the patient in the event of a mental health crisis (suicidal ideations/suicide attempt).  With written consent from the patient, the family member/significant other has been provided the following suicide prevention education, prior to the and/or following the discharge of the patient.  The suicide prevention education provided includes the following:  Suicide risk factors  Suicide prevention and interventions  National Suicide Hotline telephone number  [facility] assessment telephone number  [facility] Emergency Assistance 911  [facility] and/or Residential Mobile Crisis Unit telephone number  Request made of family/significant other to:  Remove weapons (e.g., guns, rifles, knives), all items previously/currently identified as safety concern.    Remove drugs/medications (over-the-counter, prescriptions, illicit drugs), all items previously/currently identified as a safety concern.  The family member/significant other verbalizes understanding of the suicide prevention education information provided.  The family member/significant other agrees to remove the items of safety concern listed above.  SPE and discharge plan reviewed with pt's mother. She expressed no questions or concerns regarding SPE and pt care. She plans to pick up pt at discharge and states that he will be living with her.   Smart, Thomas Sudol  LCSW 10/12/2015, 10:11 AM
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
## 2015-10-12 NOTE — Plan of Care (Signed)
Problem: Education: Goal: Ability to make informed decisions regarding treatment will improve Outcome: Progressing Nurse discussed suicidal thoughts/coping skills with patient.        

## 2015-10-12 NOTE — [facility] Group Notes (Signed)
[facility] LCSW Group Therapy  10/12/2015 12:58 PM  Type of Therapy:  Group Therapy  Participation Level:  Active  Participation Quality:  Attentive  Affect:  Appropriate  Cognitive:  Alert and Oriented  Insight:  Improving  Engagement in Therapy:  Improving  Modes of Intervention:  Discussion, Education, Exploration, Problem-solving, Rapport Building, Socialization and Support  Summary of Progress/Problems: Feelings around Relapse. Group members discussed the meaning of relapse and shared personal stories of relapse, how it affected them and others, and how they perceived themselves during this time. Group members were encouraged to identify triggers, warning signs and coping skills used when facing the possibility of relapse. Social supports were discussed and explored in detail. Thomas Atkins was attentive and engaged during today's processing group. He shared that he relapsed several months ago on alcohol and has been drinking in the morning to keep from shaking. "I know I needed help." Thomas Atkins plans to seek peer support counseling from [facility]) and plans to resume AA groups/[facility] for outpatient mental health services. He plans to move in with his mother who is not a substance abuser "like my dad who uses heroin." Pt reports living with his father was an unhealthy environment for him.    Smart, Clark Clowdus LCSW 10/12/2015, 12:58 PM

## 2015-10-12 NOTE — Progress Notes (Signed)
D:  Patient's self inventory sheet, patient has fair sleep, sleep medication is helpful.  Fair appetite, normal energy level, good concentration.  Denied depression and hopeless, anxiety #8.  Alcohol withdrawals, tremors, irritability.  Denied SI.  Physical problems, pain, dizziness.  Worst pain #5 in past 24 hours, "feel like a hit by a bus".  Goal is to go to groups, take meds, and be calm and relax.  Plans to do what he can to help himself.  Does have discharge plans. A:  Medications administered per MD orders.  Emotional support and encouragement given patient. R:  Denied SI and HI, contracts for safety.  Denied A/V hallucinations.  Safety maintained with 15 minute checks.

## 2015-10-12 NOTE — Progress Notes (Signed)
Recreation Therapy Notes  Date: 10/12/15 Time: 0930 Location: 300 Hall Group Room  Group Topic: Stress Management  Goal Area(s) Addresses:  Patient will verbalize importance of using healthy stress management.  Patient will identify positive emotions associated with healthy stress management.   Behavioral Response: Engaged  Intervention: Stress Management  Activity :  Peaceful Waves Guided Imagery.  LRT introduced to the technique of guided imagery to the patients.  Patients were asked to follow along with LRT as a script was read to engage in the technique.  Education:  Stress Management, Discharge Planning.   Education Outcome: Acknowledges edcuation/In group clarification offered/Needs additional education  Clinical Observations/Feedback: Pt attended group.  Pt stated the activity reminded him of a guided imagery he did before where he had to envision throwing all of his worries or things that had happened to him into a "sea of trash".  He explained that once you throw out everything, you have to rebuild yourself.          Caroll Rancher, LRT/CTRS
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
## 2015-10-12 NOTE — Progress Notes (Signed)
D.  Pt pleasant on approach, denies complaints at this time other than anxiety.  Pt was positive for evening AA group, observed interacting appropriately with peers on the unit.  Pt denies SI/HI/hallucinations at this time.  A.  Support and encouragement offered, medications given as ordered  R.  Pt remains safe on the unit, will continue to monitor.

## 2015-10-12 NOTE — Progress Notes (Signed)
Patient attended AA group meeting.  

## 2015-10-13 DIAGNOSIS — F1020 Alcohol dependence, uncomplicated: Secondary | ICD-10-CM

## 2015-10-13 NOTE — Progress Notes (Signed)
D.  Pt pleasant on approach, feels ready for discharge but figures that will probably be Monday.   Pt was positive for evening AA group, interacting appropriately with peers on the unit.  No complaints voiced.  Pt denies SI/HI/hallucinations at this time.  A.  Support and encouragement offered, medications given as ordered  R.  PT remains safe on the unit, will continue to monitor.

## 2015-10-13 NOTE — Progress Notes (Signed)
Patient ID: Thomas Atkins, male   DOB: [birth year], 40 y.o.   MRN: [id number]   D: Pt has been appropriate on the unit today. Pt did report some withdrawal symptoms, to include tremors. Pt was given all medication, no other issues or concerns noted. Pt reported that his depression was a 0, his hopelessness was a 0, and his anxiety was a 5. Pt reported that his goal for today was to stay cool, and don't let small things bother him. Pt reported that when he is off detox protocol which is tomorrow afternoon he wanted to be discharged. Pt reported being negative SI/HI, no AH/VH noted. A: 15 min checks continued for patient safety. R: Pt safety maintained.

## 2015-10-13 NOTE — [facility] Group Notes (Signed)
[facility] Group Notes:  (Clinical Social Work)   01/06/2015     10:00-11:00AM  Summary of Progress/Problems:   In today's process group a decisional balance exercise was used to explore in depth the perceived benefits and costs of alcohol and drugs, as well as the  benefits and costs of replacing these with healthy coping skills.  Patients listed healthy and unhealthy coping techniques, particularly those that they utilize currently.  Motivational Interviewing and the whiteboard were utilized for the exercises.  The patient expressed that the (1) healthy and (2) unhealthy coping he often uses are (1) getting involved in his community through church and the [facility] [place] support groups and (2) hanging out with people who use drugs or alcohol.  He spoke quite a bit in group but was appropriate, not monopolizing, truly trying to be helpful to others and offering encouragements.  Type of Therapy:  Group Therapy - Process   Participation Level:  Active  Participation Quality:  Attentive and Sharing  Affect:  Blunted  Cognitive:  Appropriate  Insight:  Engaged  Engagement in Therapy:  Engaged  Modes of Intervention:  Education, Motivational Interviewing  Ambrose Mantle, LCSW 10/13/2015, 3:53 PM

## 2015-10-13 NOTE — Plan of Care (Signed)
Problem: Activity: Goal: Interest or engagement in activities will improve Outcome: Progressing Pt did attend evening AA group   

## 2015-10-13 NOTE — Progress Notes (Signed)
Patient ID: Thomas Atkins, male   DOB: [birth year], 40 y.o.   MRN: [id number] [facility] MD Progress Note  10/13/2015 5:58 PM ROEL SIVERS  MRN:  [id number]  Subjective: "I'm doing fairly well today".    Objective: Patient seen, chart reviewed and case discussed with nursing staff. He feels much better today. Although he did have diaphoresis, palpitation, shortness of breath last night, he did not experience any today. He states that his baseline HR is around 100. He continues to feel anxious (scored 5/10) and feels sensitive to loud noise and he feels distressed last night while he was attending karaoke group. He reports poor appetite. He is planning to live with his mother after discharge; reports he will not go back to his father's given his father uses drugs. He denies SI. He denies any side effect from uptitration of Neurontin.   Principal Problem: Major depressive disorder, recurrent severe without psychotic features (HCC) Diagnosis:   Patient Active Problem List   Diagnosis Date Noted  . Major depressive disorder, recurrent severe without psychotic features (HCC) [F33.2] 10/10/2015  . MDD (major depressive disorder), recurrent severe, without psychosis (HCC) [F33.2] 05/31/2015  . Alcohol use disorder, severe, dependence (HCC) [F10.20] 05/30/2015   Total Time spent with patient: 15 minutes  Past Psychiatric History:  He has seen MH providers at [facility] every three months. He has been on Prozac, trazodone and benztropine (reports he takes it for his tremors). He denies drug use. He has a history of suicide attempt in 1999 and was caught by the police when he was about to jump from the bridge.   Past Medical History:  Past Medical History:  Diagnosis Date  . Alcoholism (HCC)   . Anxiety   . Depression   . Seizure disorder (HCC)   . Syncope     Past Surgical History:  Procedure Laterality Date  . SURGERY SCROTAL / TESTICULAR     Family History:  Family History  Problem Relation Age of  Onset  . Heart disease Paternal Grandmother   . Bipolar disorder Father    Family Psychiatric  History: father: heroin use, paternal uncle; heroine use, paternal uncle; suicide attempt  Social History:  History  Alcohol Use  . 7.2 oz/week  . 12 Cans of beer per week    Comment: 200oz/day      History  Drug Use  . Types: Marijuana    Social History   Social History  . Marital status: Divorced    Spouse name: N/A  . Number of children: 2  . Years of education: N/A   Occupational History  . Unemployed    Social History Main Topics  . Smoking status: Current Every Day Smoker    Packs/day: 0.50    Types: Cigarettes  . Smokeless tobacco: Never Used  . Alcohol use 7.2 oz/week    12 Cans of beer per week     Comment: 200oz/day   . Drug use:     Types: Marijuana  . Sexual activity: Not Asked   Other Topics Concern  . None   Social History Narrative  . None   Additional Social History:  Unemployment Divorced 20 years ago, has two girls (age 82,21)  Sleep: Good  Appetite:  Poor  Current Medications: Current Facility-Administered Medications  Medication Dose Route Frequency Provider Last Rate Last Dose  . acetaminophen (TYLENOL) tablet 650 mg  650 mg Oral Q6H PRN Charm Rings, NP      . alum & Jodelle Green  hydroxide-simeth (MAALOX/MYLANTA) 200-200-20 MG/5ML suspension 30 mL  30 mL Oral Q4H PRN Charm Rings, NP      . FLUoxetine (PROZAC) capsule 40 mg  40 mg Oral QHS Craige Cotta, MD   40 mg at 10/12/15 2123  . gabapentin (NEURONTIN) capsule 400 mg  400 mg Oral [id number] Neysa Hotter, MD   400 mg at 10/13/15 1456  . [START ON 10/14/2015] LORazepam (ATIVAN) tablet 1 mg  1 mg Oral Daily Sanjuana Kava, NP      . magnesium hydroxide (MILK OF MAGNESIA) suspension 30 mL  30 mL Oral Daily PRN Charm Rings, NP      . multivitamin with minerals tablet 1 tablet  1 tablet Oral Daily Sanjuana Kava, NP   1 tablet at 10/13/15 0800  . nicotine (NICODERM CQ - dosed in mg/24 hours)  patch 21 mg  21 mg Transdermal Daily Charm Rings, NP   21 mg at 10/13/15 0800  . thiamine (VITAMIN B-1) tablet 100 mg  100 mg Oral Daily Sanjuana Kava, NP   100 mg at 10/13/15 0759  . traZODone (DESYREL) tablet 100 mg  100 mg Oral QHS Sanjuana Kava, NP   100 mg at 10/12/15 2123   Lab Results:  Results for orders placed or performed during the hospital encounter of 10/10/15 (from the past 48 hour(s))  Hepatic function panel     Status: Abnormal   Collection Time: 10/12/15  6:16 PM  Result Value Ref Range   Total Protein 7.9 6.5 - 8.1 g/dL   Albumin 4.8 3.5 - 5.0 g/dL   AST 89 (H) 15 - 41 U/L   ALT 80 (H) 17 - 63 U/L   Alkaline Phosphatase 60 38 - 126 U/L   Total Bilirubin 1.0 0.3 - 1.2 mg/dL   Bilirubin, Direct <1.9 (L) 0.1 - 0.5 mg/dL   Indirect Bilirubin NOT CALCULATED 0.3 - 0.9 mg/dL    Comment: Performed at [facility]    Blood Alcohol level:  Lab Results  Component Value Date   ETH 141 (H) 10/10/2015   ETH 400 (HH) 10/09/2015    Metabolic Disorder Labs: No results found for: HGBA1C, MPG No results found for: PROLACTIN No results found for: CHOL, TRIG, HDL, CHOLHDL, VLDL, LDLCALC  Physical Findings: AIMS: Facial and Oral Movements Muscles of Facial Expression: None, normal Lips and Perioral Area: None, normal Jaw: None, normal Tongue: None, normal,Extremity Movements Upper (arms, wrists, hands, fingers): None, normal Lower (legs, knees, ankles, toes): None, normal, Trunk Movements Neck, shoulders, hips: None, normal, Overall Severity Severity of abnormal movements (highest score from questions above): None, normal Incapacitation due to abnormal movements: None, normal Patient's awareness of abnormal movements (rate only patient's report): No Awareness, Dental Status Current problems with teeth and/or dentures?: No Does patient usually wear dentures?: No  CIWA:  CIWA-Ar Total: 2 COWS:  COWS Total Score: 3  Musculoskeletal: Strength & Muscle  Tone: within normal limits Gait & Station: normal Patient leans: N/A  Psychiatric Specialty Exam: Physical Exam  Constitutional: He is oriented to person, place, and time. He appears well-developed and well-nourished.  Neurological: He is alert and oriented to person, place, and time.  Slight intentional tremors on bilateral hands    Review of Systems  Neurological: Positive for tremors.  Psychiatric/Behavioral: The patient is nervous/anxious.   All other systems reviewed and are negative.   Blood pressure 99/80, pulse (!) 127, temperature 97.6 F (36.4 C), temperature source Oral, resp.  rate 16, height 6\' 1"  (1.854 m), weight 73.5 kg (162 lb).Body mass index is 21.37 kg/m.  General Appearance: Well Groomed  Eye Contact:  Good  Speech:  Normal Rate  Volume:  Normal  Mood:  better  Affect:  Appropriate, slightly anxious  Thought Process:  Coherent and Goal Directed  Orientation:  Full (Time, Place, and Person)  Thought Content:  Logical no paranoia  Suicidal Thoughts:  No  Homicidal Thoughts:  No  Memory:  intact  Judgement:  Fair  Insight:  Fair  Psychomotor Activity:  Increased much improved  Concentration:  Concentration: Fair and Attention Span: Fair  Recall:  [employer] of Knowledge:  Good  Language:  Good  Akathisia:  No  Handed:  Ambidextrous  AIMS (if indicated):     Assets:  Communication Skills Desire for Improvement  ADL's:  Intact  Cognition:  WNL  Sleep:  Number of Hours: 6.75   Assessment 40 year old male with alcohol use disorder, depression, last admitted to [facility] in 05/2015 presented for HI to his father with plan to stab him in the setting of alcohol use after self calling the police.   # Alcohol withdrawal without history of DT # Alcohol use disorder There is significant improvement in his withdrawal symptoms; although he was noted to have tachycardia earlier afternoon ([id number]), he denies any physical symptoms and appears to be calm. Will continue  supportive management. Will uptitrate further for alcohol craving and anxiety.   # HI # Alcohol induced mood disorder He adamantly denies any intention and denies HI to his father since this admission. Continue fluoxetine to target his mood.   Treatment Plan Summary: Daily contact with patient to assess and evaluate symptoms and progress in treatment: - Continue scheduled Ativan for alcohol withdrawal  - Continue gabapentin 400 mg Q 8h - Continue Fluoxetine 40 mg qhs (pt prefers to take at night) - Continue trazodone 100 mg qhs - Check LFT: reviewed result of 10-12-15, elevated liver enzymes (AST, ALT). - Monitor for the adverse effect of the medications and anger outbursts - Continue 15 minutes observation for safety concerns - Encouraged to participate in milieu therapy and group therapy counseling sessions and also work with coping skills -  Develop treatment plan to decrease risk of relapse upon discharge and to reduce the need for readmission. -  Psycho-social education regarding relapse prevention and self care. - Health care follow up as needed for medical problems. - Restart home medications where appropriate.  Sanjuana Kava, NP, PMHNP-BC 10/13/2015, 5:58 PM  I agree with findings and treatment plan of this patient

## 2015-10-14 NOTE — Plan of Care (Signed)
Problem: Medication: Goal: Compliance with prescribed medication regimen will improve Outcome: Progressing Pt has been compliant with medication regimen   

## 2015-10-14 NOTE — Progress Notes (Signed)
Patient attended AA group meeting.  

## 2015-10-14 NOTE — Progress Notes (Signed)
Patient ID: INMAN FETTIG, male   DOB: Oct 22, 1975, 40 y.o.   MRN: [id number] Patient ID: BRAVEN WOLK, male   DOB: 1975-11-08, 40 y.o.   MRN: [id number] [facility] MD Progress Note  10/14/2015 4:07 PM JAVIN NONG  MRN:  [id number]  Subjective: "I'm doing well today. I should be ready to be discharged in the morning.".    Objective: Patient seen, chart reviewed and case discussed with nursing staff. He feels much better today. He did not experience any problems today. He is visible on the unit. He is participating in group sessions. He wants to be discharged in the morning. Denies any substance withdrawal symptoms. Completed Ativan detox protocols today.   Principal Problem: Major depressive disorder, recurrent severe without psychotic features (HCC) Diagnosis:   Patient Active Problem List   Diagnosis Date Noted  . Major depressive disorder, recurrent severe without psychotic features (HCC) [F33.2] 10/10/2015  . MDD (major depressive disorder), recurrent severe, without psychosis (HCC) [F33.2] 05/31/2015  . Alcohol use disorder, severe, dependence (HCC) [F10.20] 05/30/2015   Total Time spent with patient: 15 minutes  Past Psychiatric History:  He has seen MH providers at [facility] every three months. He has been on Prozac, trazodone and benztropine (reports he takes it for his tremors). He denies drug use. He has a history of suicide attempt in 1999 and was caught by the police when he was about to jump from the bridge.   Past Medical History:  Past Medical History:  Diagnosis Date  . Alcoholism (HCC)   . Anxiety   . Depression   . Seizure disorder (HCC)   . Syncope     Past Surgical History:  Procedure Laterality Date  . SURGERY SCROTAL / TESTICULAR     Family History:  Family History  Problem Relation Age of Onset  . Heart disease Paternal Grandmother   . Bipolar disorder Father    Family Psychiatric  History: father: heroin use, paternal uncle; heroine use, paternal uncle; suicide  attempt  Social History:  History  Alcohol Use  . 7.2 oz/week  . 12 Cans of beer per week    Comment: 200oz/day      History  Drug Use  . Types: Marijuana    Social History   Social History  . Marital status: Divorced    Spouse name: N/A  . Number of children: 2  . Years of education: N/A   Occupational History  . Unemployed    Social History Main Topics  . Smoking status: Current Every Day Smoker    Packs/day: 0.50    Types: Cigarettes  . Smokeless tobacco: Never Used  . Alcohol use 7.2 oz/week    12 Cans of beer per week     Comment: 200oz/day   . Drug use:     Types: Marijuana  . Sexual activity: Not Asked   Other Topics Concern  . None   Social History Narrative  . None   Additional Social History:  Unemployment Divorced 20 years ago, has two girls (age 33,21)  Sleep: Good  Appetite:  Poor  Current Medications: Current Facility-Administered Medications  Medication Dose Route Frequency Provider Last Rate Last Dose  . acetaminophen (TYLENOL) tablet 650 mg  650 mg Oral Q6H PRN Charm Rings, NP      . alum & mag hydroxide-simeth (MAALOX/MYLANTA) 200-200-20 MG/5ML suspension 30 mL  30 mL Oral Q4H PRN Charm Rings, NP      . FLUoxetine (PROZAC) capsule 40 mg  40 mg Oral QHS Craige Cotta, MD   40 mg at 10/13/15 2207  . gabapentin (NEURONTIN) capsule 400 mg  400 mg Oral [id number] Neysa Hotter, MD   400 mg at 10/14/15 1516  . magnesium hydroxide (MILK OF MAGNESIA) suspension 30 mL  30 mL Oral Daily PRN Charm Rings, NP      . multivitamin with minerals tablet 1 tablet  1 tablet Oral Daily Sanjuana Kava, NP   1 tablet at 10/14/15 0800  . nicotine (NICODERM CQ - dosed in mg/24 hours) patch 21 mg  21 mg Transdermal Daily Charm Rings, NP   21 mg at 10/14/15 0800  . thiamine (VITAMIN B-1) tablet 100 mg  100 mg Oral Daily Sanjuana Kava, NP   100 mg at 10/14/15 0800  . traZODone (DESYREL) tablet 100 mg  100 mg Oral QHS Sanjuana Kava, NP   100 mg at  10/13/15 2207   Lab Results:  Results for orders placed or performed during the hospital encounter of 10/10/15 (from the past 48 hour(s))  Hepatic function panel     Status: Abnormal   Collection Time: 10/12/15  6:16 PM  Result Value Ref Range   Total Protein 7.9 6.5 - 8.1 g/dL   Albumin 4.8 3.5 - 5.0 g/dL   AST 89 (H) 15 - 41 U/L   ALT 80 (H) 17 - 63 U/L   Alkaline Phosphatase 60 38 - 126 U/L   Total Bilirubin 1.0 0.3 - 1.2 mg/dL   Bilirubin, Direct <0.9 (L) 0.1 - 0.5 mg/dL   Indirect Bilirubin NOT CALCULATED 0.3 - 0.9 mg/dL    Comment: Performed at [facility]    Blood Alcohol level:  Lab Results  Component Value Date   ETH 141 (H) 10/10/2015   ETH 400 (HH) 10/09/2015   Metabolic Disorder Labs: No results found for: HGBA1C, MPG No results found for: PROLACTIN No results found for: CHOL, TRIG, HDL, CHOLHDL, VLDL, LDLCALC  Physical Findings: AIMS: Facial and Oral Movements Muscles of Facial Expression: None, normal Lips and Perioral Area: None, normal Jaw: None, normal Tongue: None, normal,Extremity Movements Upper (arms, wrists, hands, fingers): None, normal Lower (legs, knees, ankles, toes): None, normal, Trunk Movements Neck, shoulders, hips: None, normal, Overall Severity Severity of abnormal movements (highest score from questions above): None, normal Incapacitation due to abnormal movements: None, normal Patient's awareness of abnormal movements (rate only patient's report): No Awareness, Dental Status Current problems with teeth and/or dentures?: No Does patient usually wear dentures?: No  CIWA:  CIWA-Ar Total: 1 COWS:  COWS Total Score: 3  Musculoskeletal: Strength & Muscle Tone: within normal limits Gait & Station: normal Patient leans: N/A  Psychiatric Specialty Exam: Physical Exam  Constitutional: He is oriented to person, place, and time. He appears well-developed and well-nourished.  Neurological: He is alert and oriented to  person, place, and time.  Slight intentional tremors on bilateral hands    Review of Systems  Neurological: Positive for tremors.  Psychiatric/Behavioral: The patient is nervous/anxious.   All other systems reviewed and are negative.   Blood pressure 99/80, pulse (!) 127, temperature 97.6 F (36.4 C), temperature source Oral, resp. rate 16, height  (1.854 m), weight 73.5 kg (162 lb).Body mass index is 21.37 kg/m.  General Appearance: Well Groomed  Eye Contact:  Good  Speech:  Normal Rate  Volume:  Normal  Mood:  better  Affect:  Appropriate, slightly anxious  Thought Process:  Coherent and Goal  Directed  Orientation:  Full (Time, Place, and Person)  Thought Content:  Logical no paranoia  Suicidal Thoughts:  No  Homicidal Thoughts:  No  Memory:  intact  Judgement:  Fair  Insight:  Fair  Psychomotor Activity:  Increased much improved  Concentration:  Concentration: Fair and Attention Span: Fair  Recall:  Fair  Fund of Knowledge:  Good  Language:  Good  Akathisia:  No  Handed:  Ambidextrous  AIMS (if indicated):     Assets:  Communication Skills Desire for Improvement  ADL's:  Intact  Cognition:  WNL  Sleep:  Number of Hours: 5.5   Assessment 40 year old male with alcohol use disorder, depression, [facility] presented for HI to his father with plan to stab him in the setting of alcohol use after self calling the police.   # Alcohol withdrawal without history of DT # Alcohol use disorder There is significant improvement in his withdrawal symptoms; although he was noted to have tachycardia earlier afternoon ([id number]), he denies any physical symptoms and appears to be calm. Will continue supportive management. Will uptitrate further for alcohol craving and anxiety.   # HI # Alcohol induced mood disorder He adamantly denies any intention and denies HI to his father since this admission. Continue fluoxetine to target his mood.   Treatment Plan  Summary: Daily contact with patient to assess and evaluate symptoms and progress in treatment: - Completed scheduled Ativan for alcohol withdrawal  - Continue gabapentin 400 mg Q 8h - Continue Fluoxetine 40 mg qhs (pt prefers to take at night) - Continue trazodone 100 mg qhs - Check LFT: reviewed result of 10-12-15, elevated liver enzymes (AST, ALT). - Monitor for the adverse effect of the medications and anger outbursts - Continue 15 minutes observation for safety concerns - Encouraged to participate in milieu therapy and group therapy counseling sessions and also work with coping skills -  Develop treatment plan to decrease risk of relapse upon discharge and to reduce the need for readmission. -  Psycho-social education regarding relapse prevention and self care. - Health care follow up as needed for medical problems. - Restart home medications where appropriate. Patient has asked to be discharged in the morning 10-15-15.  Armandina StammerNwoko, Agnes I, NP, PMHNP-BC 10/14/2015, 4:07 PM  I agree with findings and treatment plan of this patient

## 2015-10-14 NOTE — Progress Notes (Signed)
Patient ID: Thomas Atkins, male   DOB: May 29, 1975, 40 y.o.   MRN: [id number]   D: Pt has been appropriate on the unit today. Pt was given all medication, no other issues or concerns noted. Pt reported that his depression was a 0, his hopelessness was a 0, and his anxiety was a 5. Pt reported that his goal for today was to stay calm, and to work on discharge planning. Pt reported that  he is off detox protocol and that  he wanted to be discharged. Aggie NP made aware of patients request, patient will not be discharged today. Pt reported being negative SI/HI, no AH/VH noted. A: 15 min checks continued for patient safety. R: Pt safety maintained.

## 2015-10-14 NOTE — Progress Notes (Signed)
Patient did not attnd group.

## 2015-10-14 NOTE — [facility] Group Notes (Signed)
[facility] Group Notes:  (Clinical Social Work)  10/14/2015  10:00-11:00AM  Summary of Progress/Problems:   The main focus of today's process group was to   1)  Discuss the importance of adding supports  2)  Talk about the need for healthy supports to be able to pursue various healthy coping techniques, I.e. Need for a doctor if one is going to take medications for depression  3)  Identify the patient's current healthy supports and plan what to add.  An emphasis was placed on using counselor, doctor, therapy groups, 12-step groups, and problem-specific support groups to expand supports.    The patient expressed full comprehension of the concepts presented, and agreed that there is a need to add more supports.  The patient stated his family, [facility] for his [facility] management, [facility] for his therapy, AA, and [facility] for his groups and for a new Peer Support Specialist are his healthy supports.  He gave excellent support to others throughout group.  Type of Therapy:  Process Group with Motivational Interviewing  Participation Level:  Active  Participation Quality:  Attentive and Supportive  Affect:  Appropriate  Cognitive:  Appropriate  Insight:  Engaged  Engagement in Therapy:  Engaged  Modes of Intervention:   Education, Support and Processing, Activity  Ambrose MantleMareida Grossman-Orr, LCSW 10/14/2015

## 2015-10-15 DIAGNOSIS — F332 Major depressive disorder, recurrent severe without psychotic features: Principal | ICD-10-CM

## 2015-10-15 MED ORDER — NICOTINE 21 MG/24HR TD PT24: 21.0000 mg | MEDICATED_PATCH | Freq: Every day | TRANSDERMAL | 0 refills | Status: DC

## 2015-10-15 MED ORDER — GABAPENTIN 400 MG PO CAPS: 400.0000 mg | ORAL_CAPSULE | ORAL | 0 refills | Status: DC

## 2015-10-15 MED ORDER — FLUOXETINE HCL 40 MG PO CAPS: 40.0000 mg | ORAL_CAPSULE | Freq: Every day | ORAL | 0 refills | Status: DC

## 2015-10-15 MED ORDER — TRAZODONE HCL 100 MG PO TABS: 100.0000 mg | ORAL_TABLET | Freq: Every day | ORAL | 0 refills | Status: DC

## 2015-10-15 NOTE — [facility] Group Notes (Signed)
Pt attended spiritual care group on grief and loss facilitated by chaplain Genie Wenke   Group opened with brief discussion and psycho-social ed around grief and loss in relationships and in relation to self - identifying life patterns, circumstances, changes that cause losses. Established group norm of speaking from own life experience. Group goal of establishing open and affirming space for members to share loss and experience with grief, normalize grief experience and provide psycho social education and grief support.   Group facilitation drew on Narrative, Adlerian, and brief CBT frameworks.  

## 2015-10-15 NOTE — Plan of Care (Signed)
Problem: Activity: Goal: Interest or engagement in activities will improve Outcome: Progressing Pt has been present and active in all groups during my weekend shift

## 2015-10-15 NOTE — Progress Notes (Signed)
Patient ID: Thomas Atkins, male   DOB: 18-Jan-1976, 40 y.o.   MRN: [id number]  Patient was discharged per order. AVS, medications, scripts, med samples, transition summary, and suicide safety plan were all reviewed with patient. Pt was given an opportunity to ask questions and verbalized understanding of all discharge paperwork. Belongings were returned, and patient signed for receipt. Patient verbalized readiness for discharge and appeared in no acute distress when escorted to lobby.

## 2015-10-15 NOTE — Progress Notes (Signed)
  [facility] :  Will you be returning to the same living situation after discharge:  Yes,  home At discharge, do you have transportation home?: Yes,  mother or stepfather.  Do you have the ability to pay for your medications: Yes,  mental health  Release of information consent forms completed and submitted to medical records by CSW.  Patient to Follow up at: Follow-up Information    [facility] .   Specialty:  Behavioral Health Why:  No appts available prior to late September. [facility] suggests that you walk in at 7:45AM no more than 2 days after discharge (walk in clinic hours Mon-Fri 8am-3pm) in order to be seen for hospital follow-up/medication management.  Contact information: [address] [place] [address] [phone]           Next level of care provider has access to [facility] Link:no  Safety Planning and Suicide Prevention discussed: Yes,  SPE completed with pt's mother. SPI pamphlet and Mobile Crisis information also provided to pt.   Have you used any form of tobacco in the last 30 days? (Cigarettes, Smokeless Tobacco, Cigars, and/or Pipes): Yes  Has patient been referred to the Quitline?: Patient refused referral  Patient has been referred for addiction treatment: Yes  Smart, Samira Acero LCSW 10/15/2015, 11:41 AM

## 2015-10-15 NOTE — Progress Notes (Signed)
D: [name] says, "I feel great!" He denied SI/HI/AVH. He admitted significant anxiety r/t discharge but said he feels more than ready to go. He had no physical complaints. He has been observed interacting well in the milieu.  A: Meds given as ordered. Q15 safety checks maintained. Support/encouragement offered.  R: Pt remains free from harm and continues with treatment. Will continue to monitor for needs/safety.
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
## 2015-10-15 NOTE — Progress Notes (Signed)
Recreation Therapy Notes  Date: 10/15/15 Time: 0930 Location: 300 Hall Group Room  Group Topic: Stress Management  Goal Area(s) Addresses:  Patient will verbalize importance of using healthy stress management.  Patient will identify positive emotions associated with healthy stress management.   Intervention: Stress Management  Activity :  Forest Visualization.  LRT introduced pt to the technique of guided imagery.  Patients were to follow along as LRT read script to engage in the activity.   Education:  Stress Management, Discharge Planning.   Clinical Observations/Feedback: Pt did not attend group.     Cranford Blessinger, LRT/CTRS  

## 2015-10-15 NOTE — [facility] Suicide Risk Assessment (Addendum)
[facility]   Principal Problem: Major depressive disorder, recurrent severe without psychotic features [facility](HCC) Discharge Diagnoses:  Patient Active Problem List   Diagnosis Date Noted  . Major depressive disorder, recurrent severe without psychotic features (HCC) [F33.2] 10/10/2015  . MDD (major depressive disorder), recurrent severe, without psychosis (HCC) [F33.2] 05/31/2015  . Alcohol use disorder, severe, dependence (HCC) [F10.20] 05/30/2015    Total Time spent with patient: 30 minutes  Musculoskeletal: Strength & Muscle Tone: within normal limits Gait & Station: normal Patient leans: N/A  Psychiatric Specialty Exam: ROSdenies headache, no chest pain, no nausea, no vomiting , no diarrhea .   Blood pressure 110/86, pulse 95, temperature 98 F (36.7 C), temperature source Oral, resp. rate 16, height 6\' 1"  (1.854 m), weight 162 lb (73.5 kg).Body mass index is 21.37 kg/m.  General Appearance: Well Groomed  Eye Contact::  Good  Speech:  Normal Rate409  Volume:  Normal  Mood:  mood described as improved, denies depression  Affect:  Appropriate  Thought Process:  Linear  Orientation:  Full (Time, Place, and Person)  Thought Content:  no hallucinations, no delusions, not internally preoccupied   Suicidal Thoughts:  No denies suicidal ideations, no self injurious ideations  Homicidal Thoughts:  No denies any violent or homicidal ideations   Memory:  recent and remote grossly intact   Judgement:  Other:  improved  Insight:  improved  Psychomotor Activity:  Normal- no current tremors, diaphoresis, or psychomotor restlessness   Concentration:  Good  Recall:  Good  Fund of Knowledge:Good  Language: Good  Akathisia:  Negative  Handed:  Right  AIMS (if indicated):     Assets:  Desire for Improvement Resilience  Sleep:  Number of Hours: 6  Cognition: WNL  ADL's:  Intact   Mental Status Per Nursing Assessment::   On Admission:  Suicidal ideation  indicated by patient, Suicide plan, Self-harm thoughts, Self-harm behaviors  Demographic Factors:  40 year old divorced male, has two children , one adult and one teenager who lives with the mother .  Loss Factors: Recent argument with father , had been drinking heavily.  Historical Factors: Long history of alcohol dependence, history or depression , prior admissions for detox   Risk Reduction Factors:   Responsible for children under 42 years of age, Sense of responsibility to family, Living with another person, especially a relative and Positive coping skills or problem solving skills  Continued Clinical Symptoms:  At this time patient is alert, attentive,well related, pleasant,mood is "OK", affect is appropriate, no thought disorder, no suicidal or self injurious ideations, no psychotic symptoms, no ongoing symptoms of withdrawal , denies cravings. Denies medication side effects.   Cognitive Features That Contribute To Risk:  No gross cognitive deficits noted upon discharge. Is alert , attentive, and oriented x 3   Suicide Risk:  Mild:  Suicidal ideation of limited frequency, intensity, duration, and specificity.  There are no identifiable plans, no associated intent, mild dysphoria and related symptoms, good self-control (both objective and subjective assessment), few other risk factors, and identifiable protective factors, including available and accessible social support.  Follow-up Information    [facility] .   Specialty:  Behavioral Health Why:  No appts available prior to late September. [facility] suggests that you walk in at 7:45AM no more than 2 days after discharge (walk in clinic hours Mon-Fri 8am-3pm) in order to be seen for hospital follow-up/medication management.  Contact information: [address] [place] [address] [phone]  Plan Of Care/Follow-up recommendations:  Activity:  as tolerated Diet:  Regular  Tests:  NA Other:  See below Patient is  being discharged in good spirits  Plans to live with mother Plans to follow up as above and go to AA regularly  We discussed elevated LFTs , and recommended he follow up with PCP for further monitoring .  Nehemiah Massed, MD 10/15/2015, 10:14 AM
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
## 2015-12-19 ENCOUNTER — Emergency Department (HOSPITAL_COMMUNITY)
Admission: EM | Admit: 2015-12-19 | Discharge: 2015-12-19 | Disposition: A | Discharge status: Other Acute Inpatient Hospital | Payer: Self-pay | Attending: Emergency Medicine | Admitting: Emergency Medicine

## 2015-12-19 ENCOUNTER — Encounter (HOSPITAL_COMMUNITY): Payer: Self-pay

## 2015-12-19 ENCOUNTER — Observation Stay (HOSPITAL_COMMUNITY)
Admission: AD | Admit: 2015-12-19 | Discharge: 2015-12-20 | Disposition: A | Discharge status: Home / Self Care | Payer: Federal, State, Local not specified - Other | Source: Intra-hospital | Attending: Psychiatry | Admitting: Psychiatry

## 2015-12-19 ENCOUNTER — Encounter (HOSPITAL_COMMUNITY): Payer: Self-pay | Admitting: Emergency Medicine

## 2015-12-19 DIAGNOSIS — F332 Major depressive disorder, recurrent severe without psychotic features: Secondary | ICD-10-CM | POA: Insufficient documentation

## 2015-12-19 DIAGNOSIS — F1010 Alcohol abuse, uncomplicated: Secondary | ICD-10-CM | POA: Insufficient documentation

## 2015-12-19 DIAGNOSIS — F17210 Nicotine dependence, cigarettes, uncomplicated: Secondary | ICD-10-CM | POA: Insufficient documentation

## 2015-12-19 DIAGNOSIS — Z818 Family history of other mental and behavioral disorders: Secondary | ICD-10-CM

## 2015-12-19 DIAGNOSIS — F1020 Alcohol dependence, uncomplicated: Secondary | ICD-10-CM | POA: Diagnosis present

## 2015-12-19 DIAGNOSIS — F1014 Alcohol abuse with alcohol-induced mood disorder: Principal | ICD-10-CM | POA: Insufficient documentation

## 2015-12-19 DIAGNOSIS — G40909 Epilepsy, unspecified, not intractable, without status epilepticus: Secondary | ICD-10-CM | POA: Insufficient documentation

## 2015-12-19 DIAGNOSIS — Z79899 Other long term (current) drug therapy: Secondary | ICD-10-CM

## 2015-12-19 DIAGNOSIS — Z8249 Family history of ischemic heart disease and other diseases of the circulatory system: Secondary | ICD-10-CM | POA: Diagnosis not present

## 2015-12-19 DIAGNOSIS — R45851 Suicidal ideations: Secondary | ICD-10-CM

## 2015-12-19 LAB — COMPREHENSIVE METABOLIC PANEL
ALBUMIN: 4.6 g/dL (ref 3.5–5.0)
ALK PHOS: 57 U/L (ref 38–126)
ALT: 70 U/L — ABNORMAL HIGH (ref 17–63)
ANION GAP: 10 (ref 5–15)
AST: 72 U/L — ABNORMAL HIGH (ref 15–41)
BUN: 10 mg/dL (ref 6–20)
CALCIUM: 9.3 mg/dL (ref 8.9–10.3)
CHLORIDE: 107 mmol/L (ref 101–111)
CO2: 24 mmol/L (ref 22–32)
CREATININE: 0.75 mg/dL (ref 0.61–1.24)
GFR calc Af Amer: >60 mL/min (ref >60)
GFR calc non Af Amer: >60 mL/min (ref >60)
GLUCOSE: 92 mg/dL (ref 65–99)
Potassium: 3.9 mmol/L (ref 3.5–5.1)
SODIUM: 141 mmol/L (ref 135–145)
TOTAL PROTEIN: 7.9 g/dL (ref 6.5–8.1)
Total Bilirubin: 0.7 mg/dL (ref 0.3–1.2)

## 2015-12-19 LAB — CBC WITH DIFFERENTIAL/PLATELET
BASOS ABS: 0 10*3/uL (ref 0.0–0.1)
BASOS PCT: 0 %
EOS ABS: 0.2 10*3/uL (ref 0.0–0.7)
EOS PCT: 3 %
HCT: 42.2 % (ref 39.0–52.0)
HEMOGLOBIN: 14.4 g/dL (ref 13.0–17.0)
Lymphocytes Relative: 45 %
Lymphs Abs: 3.2 10*3/uL (ref 0.7–4.0)
MCH: 33.4 pg (ref 26.0–34.0)
MCHC: 34.1 g/dL (ref 30.0–36.0)
MCV: 97.9 fL (ref 78.0–100.0)
Monocytes Absolute: 0.7 10*3/uL (ref 0.1–1.0)
Monocytes Relative: 11 %
NEUTROS PCT: 41 %
Neutro Abs: 2.8 10*3/uL (ref 1.7–7.7)
PLATELETS: 123 10*3/uL — AB (ref 150–400)
RBC: 4.31 MIL/uL (ref 4.22–5.81)
RDW: 13.7 % (ref 11.5–15.5)
WBC: 6.9 10*3/uL (ref 4.0–10.5)

## 2015-12-19 LAB — ETHANOL: ALCOHOL ETHYL (B): 276 mg/dL — AB (ref <5)

## 2015-12-19 LAB — RAPID URINE DRUG SCREEN, HOSP PERFORMED
AMPHETAMINES: NOT DETECTED
BENZODIAZEPINES: POSITIVE — AB
Barbiturates: NOT DETECTED
COCAINE: NOT DETECTED
OPIATES: NOT DETECTED
TETRAHYDROCANNABINOL: NOT DETECTED

## 2015-12-19 MED ORDER — LORAZEPAM 1 MG PO TABS: 0.0000 mg | ORAL_TABLET | Freq: Two times a day (BID) | ORAL | Status: DC

## 2015-12-19 MED ORDER — TRAZODONE HCL 100 MG PO TABS: 100.0000 mg | ORAL_TABLET | Freq: Every day | ORAL | Status: DC

## 2015-12-19 MED ORDER — LORAZEPAM 1 MG PO TABS
1.0000 mg | ORAL_TABLET | Freq: Three times a day (TID) | ORAL | Status: DC
  Administered 2015-12-20 (×2): 1 mg via ORAL
  Filled 2015-12-19 (×2): qty 1

## 2015-12-19 MED ORDER — ADULT MULTIVITAMIN W/MINERALS CH
1.0000 | ORAL_TABLET | Freq: Every day | ORAL | Status: DC
  Administered 2015-12-19 – 2015-12-20 (×2): 1 via ORAL
  Filled 2015-12-19: qty 1

## 2015-12-19 MED ORDER — LOPERAMIDE HCL 2 MG PO CAPS: 2.0000 mg | ORAL_CAPSULE | ORAL | Status: DC | PRN

## 2015-12-19 MED ORDER — FLUOXETINE HCL 20 MG PO CAPS: 40.0000 mg | ORAL_CAPSULE | Freq: Every day | ORAL | Status: DC

## 2015-12-19 MED ORDER — ONDANSETRON 4 MG PO TBDP
4.0000 mg | ORAL_TABLET | Freq: Four times a day (QID) | ORAL | Status: DC | PRN
  Administered 2015-12-20 (×2): 4 mg via ORAL
  Filled 2015-12-19 (×2): qty 1

## 2015-12-19 MED ORDER — ARIPIPRAZOLE 2 MG PO TABS
2.0000 mg | ORAL_TABLET | Freq: Two times a day (BID) | ORAL | Status: DC
  Administered 2015-12-19 – 2015-12-20 (×2): 2 mg via ORAL
  Filled 2015-12-19 (×2): qty 1

## 2015-12-19 MED ORDER — ALUM & MAG HYDROXIDE-SIMETH 200-200-20 MG/5ML PO SUSP: 30.0000 mL | ORAL | Status: DC | PRN

## 2015-12-19 MED ORDER — LORAZEPAM 1 MG PO TABS: 1.0000 mg | ORAL_TABLET | Freq: Four times a day (QID) | ORAL | Status: DC | PRN

## 2015-12-19 MED ORDER — FOLIC ACID 1 MG PO TABS
1.0000 mg | ORAL_TABLET | Freq: Every day | ORAL | Status: DC
  Administered 2015-12-19: 1 mg via ORAL
  Filled 2015-12-19: qty 1

## 2015-12-19 MED ORDER — ACETAMINOPHEN 325 MG PO TABS
650.0000 mg | ORAL_TABLET | Freq: Four times a day (QID) | ORAL | Status: DC | PRN
Start: 2015-12-19 — End: 2015-12-20

## 2015-12-19 MED ORDER — THIAMINE HCL 100 MG/ML IJ SOLN
100.0000 mg | Freq: Once | INTRAMUSCULAR | Status: AC
  Administered 2015-12-19: 100 mg via INTRAMUSCULAR
  Filled 2015-12-19: qty 2

## 2015-12-19 MED ORDER — LORAZEPAM 1 MG PO TABS
1.0000 mg | ORAL_TABLET | Freq: Four times a day (QID) | ORAL | Status: AC
  Administered 2015-12-19 (×3): 1 mg via ORAL
  Filled 2015-12-19 (×2): qty 1

## 2015-12-19 MED ORDER — TRAZODONE HCL 100 MG PO TABS
100.0000 mg | ORAL_TABLET | Freq: Every day | ORAL | Status: DC
  Administered 2015-12-19: 100 mg via ORAL
  Filled 2015-12-19: qty 1

## 2015-12-19 MED ORDER — VITAMIN B-1 100 MG PO TABS
100.0000 mg | ORAL_TABLET | Freq: Every day | ORAL | Status: DC
  Administered 2015-12-20: 100 mg via ORAL
  Filled 2015-12-19: qty 1

## 2015-12-19 MED ORDER — NICOTINE 21 MG/24HR TD PT24
21.0000 mg | MEDICATED_PATCH | Freq: Every day | TRANSDERMAL | Status: DC
  Administered 2015-12-20: 21 mg via TRANSDERMAL
  Filled 2015-12-19: qty 1

## 2015-12-19 MED ORDER — LORAZEPAM 1 MG PO TABS
ORAL_TABLET | ORAL | Status: AC
  Administered 2015-12-19: 1 mg via ORAL
  Filled 2015-12-19: qty 2

## 2015-12-19 MED ORDER — HYDROXYZINE HCL 25 MG PO TABS: 25.0000 mg | ORAL_TABLET | Freq: Four times a day (QID) | ORAL | Status: DC | PRN

## 2015-12-19 MED ORDER — VITAMIN B-1 100 MG PO TABS: 100.0000 mg | ORAL_TABLET | Freq: Every day | ORAL | Status: DC

## 2015-12-19 MED ORDER — LORAZEPAM 1 MG PO TABS: 1.0000 mg | ORAL_TABLET | Freq: Two times a day (BID) | ORAL | Status: DC

## 2015-12-19 MED ORDER — LORAZEPAM 2 MG/ML IJ SOLN: 1.0000 mg | Freq: Four times a day (QID) | INTRAMUSCULAR | Status: DC | PRN

## 2015-12-19 MED ORDER — FLUOXETINE HCL 20 MG PO CAPS
40.0000 mg | ORAL_CAPSULE | Freq: Every day | ORAL | Status: DC
  Administered 2015-12-19: 40 mg via ORAL
  Filled 2015-12-19: qty 2

## 2015-12-19 MED ORDER — LORAZEPAM 1 MG PO TABS: 0.0000 mg | ORAL_TABLET | Freq: Four times a day (QID) | ORAL | Status: DC

## 2015-12-19 MED ORDER — NICOTINE 21 MG/24HR TD PT24
21.0000 mg | MEDICATED_PATCH | Freq: Every day | TRANSDERMAL | Status: DC
  Administered 2015-12-19 (×2): 21 mg via TRANSDERMAL
  Filled 2015-12-19 (×2): qty 1

## 2015-12-19 MED ORDER — LORAZEPAM 1 MG PO TABS
0.0000 mg | ORAL_TABLET | Freq: Four times a day (QID) | ORAL | Status: DC
  Administered 2015-12-19: 1 mg via ORAL
  Filled 2015-12-19: qty 1

## 2015-12-19 MED ORDER — LORAZEPAM 1 MG PO TABS: 1.0000 mg | ORAL_TABLET | Freq: Every day | ORAL | Status: DC

## 2015-12-19 MED ORDER — THIAMINE HCL 100 MG/ML IJ SOLN: 100.0000 mg | Freq: Every day | INTRAMUSCULAR | Status: DC

## 2015-12-19 MED ORDER — FOLIC ACID 1 MG PO TABS
1.0000 mg | ORAL_TABLET | Freq: Every day | ORAL | Status: DC
  Administered 2015-12-20: 1 mg via ORAL
  Filled 2015-12-19: qty 1

## 2015-12-19 MED ORDER — ADULT MULTIVITAMIN W/MINERALS CH
1.0000 | ORAL_TABLET | Freq: Every day | ORAL | Status: DC
Start: 2015-12-19 — End: 2015-12-19
  Administered 2015-12-19: 1 via ORAL
  Filled 2015-12-19: qty 1

## 2015-12-19 MED ORDER — MAGNESIUM HYDROXIDE 400 MG/5ML PO SUSP: 30.0000 mL | Freq: Every day | ORAL | Status: DC | PRN

## 2015-12-19 MED ORDER — ADULT MULTIVITAMIN W/MINERALS CH
1.0000 | ORAL_TABLET | Freq: Every day | ORAL | Status: DC
  Filled 2015-12-19: qty 1

## 2015-12-19 NOTE — ED Provider Notes (Signed)
By signing my name below, I, Emmanuella Mensah, attest that this documentation has been prepared under the direction and in the presence of Andon Villard N Milisa Kimbell, DO. Electronically Signed: Angelene GiovanniEmmanuella Mensah, ED Scribe. 12/19/15. 2:21 AM.  TIME SEEN: 1:58 AM   CHIEF COMPLAINT: Depression   HPI:  Thomas Atkins is a 40 y.o. male with a hx of MDD and alcoholism who presents to the Emergency Department for evaluation s/p depression onset yesterday. He states that he has been feeling "hopeless and worthless" because his girlfriend does not want him anymore if he continues to drink ETOH. He adds that it has been hard for him since his father and uncle both use illicit drugs and ETOH. He reports associated passive suicidal ideation stating that he "just does not want to live anymore" but does not have a plan. He reports that his last drink of ETOH was at 9 pm yesterday and he normally drinks 3-4 40s daily but denies any illicit drug use PTA. He explains that yesterday, he went to the firehouse for help and they called EMS to bring him here for evaluation. No alleviating factors noted. He denies any HI, hallucinations, nausea, fever, hematemesis, bloody stools or melena, abdominal pain, or any other symptoms.   ROS: See HPI Constitutional: no fever  Eyes: no drainage  ENT: no runny nose   Cardiovascular:  no chest pain  Resp: no SOB  GI: no vomiting GU: no dysuria Integumentary: no rash  Allergy: no hives  Musculoskeletal: no leg swelling  Neurological: Slight slurred speech ROS otherwise negative  PAST MEDICAL HISTORY/PAST SURGICAL HISTORY:  Past Medical History:  Diagnosis Date  . Alcoholism (HCC)   . Anxiety   . Depression   . Seizure disorder (HCC)   . Syncope     MEDICATIONS:  Prior to Admission medications   Medication Sig Start Date End Date Taking? Authorizing Provider  FLUoxetine (PROZAC) 40 MG capsule Take 1 capsule (40 mg total) by mouth at bedtime. 10/15/15  Yes Oneta Rackanika N Lewis, NP   thiamine (VITAMIN B-1) 100 MG tablet Take 100 mg by mouth daily.   Yes Historical Provider, MD  traZODone (DESYREL) 100 MG tablet Take 1 tablet (100 mg total) by mouth at bedtime. 10/15/15  Yes Oneta Rackanika N Lewis, NP  gabapentin (NEURONTIN) 400 MG capsule Take 1 capsule (400 mg total) by mouth 3 (three) times daily at 8am, 3pm and bedtime. Patient not taking: Reported on 12/19/2015 10/15/15   Oneta Rackanika N Lewis, NP  nicotine (NICODERM CQ - DOSED IN MG/24 HOURS) 21 mg/24hr patch Place 1 patch (21 mg total) onto the skin daily. Patient not taking: Reported on 12/19/2015 10/15/15   Oneta Rackanika N Lewis, NP    ALLERGIES:  Allergies  Allergen Reactions  . Bee Venom   . Lactose Intolerance (Gi) Diarrhea, Nausea Only and Other (See Comments)    Abdominal pain    . Ultram [Tramadol Hcl] Hives         SOCIAL HISTORY:  Social History  Substance Use Topics  . Smoking status: Current Every Day Smoker    Packs/day: 0.50    Types: Cigarettes  . Smokeless tobacco: Never Used  . Alcohol use 7.2 oz/week    12 Cans of beer per week     Comment: 200oz/day     FAMILY HISTORY: Family History  Problem Relation Age of Onset  . Heart disease Paternal Grandmother   . Bipolar disorder Father     EXAM: BP 121/94 (BP Location: Right Arm)  Pulse 102   Temp 98.3 F (36.8 C) (Oral)   Resp 20   Ht 6\' 1"  (1.854 m)   Wt 165 lb (74.8 kg)   SpO2 95%   BMI 21.77 kg/m  CONSTITUTIONAL: Alert and oriented and responds appropriately to questions. Well-appearing; well-nourished; appears intoxicated; Slightly slurred speech; smells of ETOH HEAD: Normocephalic EYES: Conjunctivae clear, PERRL ENT: normal nose; no rhinorrhea; moist mucous membranes NECK: Supple, no meningismus, no LAD  CARD: RRR; S1 and S2 appreciated; no murmurs, no clicks, no rubs, no gallops RESP: Normal chest excursion without splinting or tachypnea; breath sounds clear and equal bilaterally; no wheezes, no rhonchi, no rales, no hypoxia or respiratory  distress, speaking full sentences ABD/GI: Normal bowel sounds; non-distended; soft, non-tender, no rebound, no guarding, no peritoneal signs BACK:  The back appears normal and is non-tender to palpation, there is no CVA tenderness EXT: Normal ROM in all joints; non-tender to palpation; no edema; normal capillary refill; no cyanosis, no calf tenderness or swelling    SKIN: Normal color for age and race; warm; no rash NEURO: Moves all extremities equally, sensation to light touch intact diffusely, cranial nerves II through XII intact PSYCH: Passive suicidality and states "I just don't want to be here any more"; No HI or hallucinations. Grooming and personal hygiene are appropriate.  MEDICAL DECISION MAKING: Patient with alcohol abuse and passive suicidality. No current medical complaints. We'll obtain screening labs, urine and consult TTS.  ED PROGRESS: Patient's labs show elevation of his AST, ALT consistent with alcohol abuse. Alcohol level is 276.  Alcohol level will be less than 100 around 9 AM. TTS consult is pending for disposition.     I personally performed the services described in this documentation, which was scribed in my presence. The recorded information has been reviewed and is accurate.    Layla Maw Paytyn Mesta, DO 12/19/15 [id number]

## 2015-12-19 NOTE — BH Assessment (Signed)
Pt BAL 276. TTS consult request to be resubmitted. Clinician spoke with Misty StanleyLisa, RN.

## 2015-12-19 NOTE — BHH Counselor (Signed)
This Probation officer met with pt post admittance to the unit. Pt disclosed that he currently goes to [facility] for his medication management, but can't recall the name of his psychiatrist. Pt also states that he missed his last appointment at [facility] due to some personal issues. Pt reports that he sees a therapist at the [facility] every Saturday; but recently his therapist suffered a heart attack and he has not been able to see him on a regular basis as stated earlier. Pt states that he would like to be scheduled for a follow up appointment at [facility] prior to being discharged from this department. Pt is currently denying SI/HI/AVH.

## 2015-12-19 NOTE — ED Notes (Signed)
Bed: [id number] Expected date:  Expected time:  Means of arrival:  Comments: Hold for triage when Etoh decrease

## 2015-12-19 NOTE — Consult Note (Deleted)
[facility] OBS UNIT H&P   Patient Identification: Thomas Atkins MRN:  [id number] Principal Diagnosis: Alcohol abuse with alcohol-induced mood disorder [facility]) Diagnosis:   Patient Active Problem List   Diagnosis Date Noted  . Major depressive disorder, recurrent severe without psychotic features ([place]) [F33.2] 10/10/2015    Priority: High  . Alcohol abuse with alcohol-induced mood disorder ([place]) [F10.14] 05/31/2015    Priority: High  . Alcohol use disorder, severe, dependence ([place]) [F10.20] 05/30/2015    Priority: High    Total Time spent with patient: 45 minutes  Subjective:   Thomas Atkins is a 40 y.o. male patient admitted with alcohol abuse with alcohol induced mood disorder. Pt states "I have severe racing thoughts to the point I am not motivated. Alert, Oriented x 3, Denies suicidal/homicidal ideations, denies audio/visual hallucinations.   HPI:  I have reviewed and concur with HPI elements below, modified as follows: Thomas Atkins is an 40 y.o. male. He presents to [facility] via EMS. He lives with his father who has health issues. Last night he decided to leave his home and walk to a fire station yesterday. Patient told someone at the fire station that he felt helpless; EMS was called. This morning patient continues to have feelings of helplessness. He reports depressive symptoms including crying spells, loss of interest in usual pleasures, guilt, and worthlessness. Patient is not sleeping well. His appetite is poor. Sts that he is not only concerned about his fathers health and heavy alcohol use. He also sts, "My girlfriend doesn't want me anymore either". Patient sts that it is so many other things that are stressing him out but he would not elaborate any further about those stressors.   Patient denies suicidal thoughts. However states, "Although I'm not going to kill myself I don't want to live anymore". States, "If I went to sleep and never woke up I would be ok with that". He denies suicidal intent.  He denies history of suicidal attempts or gestures. No homicidal ideation. No history of violent or aggressive behaviors. No current legal issues. Patient has a history of serving prison time. Denies psychosis. Patient receives outpatient services with [facility]. He has received Inpt treatment at [facility] (10/10/2015, 05/30/2015, 06/29/2013, 02/16/2013).   Patient reports heavy alcohol use. He started drinking at the age of 73. He reports daily use for several months. Patient drinks (2) to (10) 40 ounce beers per day. Last drink was last night. Patient reports a history of seizures. He is unsure of his last seizure. He reports current withdrawal symptoms (aggitation, nausea, and hot flashes).   Pt was transferred from the ED to [place]. Seen and chart reviewed as above on 12/19/2015 .   Past Psychiatric History: ETOH, MDD  Risk to Self: Is patient at risk for suicide?: No Risk to Others:   Prior Inpatient Therapy:   Prior Outpatient Therapy:    Past Medical History:  Past Medical History:  Diagnosis Date  . Alcoholism ([place])   . Anxiety   . Depression   . Seizure disorder ([place])   . Syncope     Past Surgical History:  Procedure Laterality Date  . SURGERY SCROTAL / TESTICULAR     Family History:  Family History  Problem Relation Age of Onset  . Heart disease Paternal Grandmother   . Bipolar disorder Father    Family Psychiatric  History: Unknown Social History:  History  Alcohol Use  . 14.4 oz/week  . 24 Cans of beer per week  Comment: 320 oz daily     History  Drug Use No    Comment: Denies drug use    Social History   Social History  . Marital status: Divorced    Spouse name: N/A  . Number of children: 2  . Years of education: N/A   Occupational History  . Unemployed    Social History Main Topics  . Smoking status: Current Every Day Smoker    Packs/day: 1.00    Years: 30.00    Types: Cigarettes  . Smokeless tobacco: Never Used     Comment: Patient refused  cessation materials  . Alcohol use 14.4 oz/week    24 Cans of beer per week     Comment: 320 oz daily  . Drug use: No     Comment: Denies drug use  . Sexual activity: Not Asked   Other Topics Concern  . None   Social History Narrative  . None   Additional Social History:    Allergies:   Allergies  Allergen Reactions  . Bee Venom   . Lactose Intolerance (Gi) Diarrhea, Nausea Only and Other (See Comments)    Abdominal pain    . Ultram [Tramadol Hcl] Hives         Labs:  Results for orders placed or performed during the hospital encounter of 12/19/15 (from the past 48 hour(s))  Urine rapid drug screen (hosp performed)not at [facility]     Status: Abnormal   Collection Time: 12/19/15  2:03 AM  Result Value Ref Range   Opiates NONE DETECTED NONE DETECTED   Cocaine NONE DETECTED NONE DETECTED   Benzodiazepines POSITIVE (A) NONE DETECTED   Amphetamines NONE DETECTED NONE DETECTED   Tetrahydrocannabinol NONE DETECTED NONE DETECTED   Barbiturates NONE DETECTED NONE DETECTED    Comment:        DRUG SCREEN FOR MEDICAL PURPOSES ONLY.  IF CONFIRMATION IS NEEDED FOR ANY PURPOSE, NOTIFY LAB WITHIN 5 DAYS.        LOWEST DETECTABLE LIMITS FOR URINE DRUG SCREEN Drug Class       Cutoff (ng/mL) Amphetamine      1000 Barbiturate      200 Benzodiazepine   563 Tricyclics       149 Opiates          300 Cocaine          300 THC              50   Comprehensive metabolic panel     Status: Abnormal   Collection Time: 12/19/15  2:10 AM  Result Value Ref Range   Sodium 141 135 - 145 mmol/L   Potassium 3.9 3.5 - 5.1 mmol/L   Chloride 107 101 - 111 mmol/L   CO2 24 22 - 32 mmol/L   Glucose, Bld 92 65 - 99 mg/dL   BUN 10 6 - 20 mg/dL   Creatinine, Ser 0.75 0.61 - 1.24 mg/dL   Calcium 9.3 8.9 - 10.3 mg/dL   Total Protein 7.9 6.5 - 8.1 g/dL   Albumin 4.6 3.5 - 5.0 g/dL   AST 72 (H) 15 - 41 U/L   ALT 70 (H) 17 - 63 U/L   Alkaline Phosphatase 57 38 - 126 U/L   Total Bilirubin 0.7 0.3 -  1.2 mg/dL   GFR calc non Af Amer >60 >60 mL/min   GFR calc Af Amer >60 >60 mL/min    Comment: (NOTE) The eGFR has been calculated using the CKD EPI equation.  This calculation has not been validated in all clinical situations. eGFR's persistently <60 mL/min signify possible Chronic Kidney Disease.    Anion gap 10 5 - 15  Ethanol     Status: Abnormal   Collection Time: 12/19/15  2:10 AM  Result Value Ref Range   Alcohol, Ethyl (B) 276 (H) <5 mg/dL    Comment:        LOWEST DETECTABLE LIMIT FOR SERUM ALCOHOL IS 5 mg/dL FOR MEDICAL PURPOSES ONLY   CBC with Diff     Status: Abnormal   Collection Time: 12/19/15  2:10 AM  Result Value Ref Range   WBC 6.9 4.0 - 10.5 K/uL   RBC 4.31 4.22 - 5.81 MIL/uL   Hemoglobin 14.4 13.0 - 17.0 g/dL   HCT 42.2 39.0 - 52.0 %   MCV 97.9 78.0 - 100.0 fL   MCH 33.4 26.0 - 34.0 pg   MCHC 34.1 30.0 - 36.0 g/dL   RDW 13.7 11.5 - 15.5 %   Platelets 123 (L) 150 - 400 K/uL   Neutrophils Relative % 41 %   Neutro Abs 2.8 1.7 - 7.7 K/uL   Lymphocytes Relative 45 %   Lymphs Abs 3.2 0.7 - 4.0 K/uL   Monocytes Relative 11 %   Monocytes Absolute 0.7 0.1 - 1.0 K/uL   Eosinophils Relative 3 %   Eosinophils Absolute 0.2 0.0 - 0.7 K/uL   Basophils Relative 0 %   Basophils Absolute 0.0 0.0 - 0.1 K/uL    Current Facility-Administered Medications  Medication Dose Route Frequency Provider Last Rate Last Dose  . acetaminophen (TYLENOL) tablet 650 mg  650 mg Oral Q6H PRN Patrecia Pour, NP      . alum & mag hydroxide-simeth (MAALOX/MYLANTA) 200-200-20 MG/5ML suspension 30 mL  30 mL Oral Q4H PRN Patrecia Pour, NP      . FLUoxetine (PROZAC) capsule 40 mg  40 mg Oral QHS Patrecia Pour, NP      . Derrill Memo ON 97/98/9211] folic acid (FOLVITE) tablet 1 mg  1 mg Oral Daily Patrecia Pour, NP      . hydrOXYzine (ATARAX/VISTARIL) tablet 25 mg  25 mg Oral Q6H PRN Hampton Abbot, MD      . loperamide (IMODIUM) capsule 2-4 mg  2-4 mg Oral PRN Hampton Abbot, MD      . LORazepam  (ATIVAN) tablet 1 mg  1 mg Oral QID Hampton Abbot, MD   1 mg at 12/19/15 1347   Followed by  . [START ON 12/20/2015] LORazepam (ATIVAN) tablet 1 mg  1 mg Oral TID Hampton Abbot, MD       Followed by  . [START ON 12/21/2015] LORazepam (ATIVAN) tablet 1 mg  1 mg Oral BID Hampton Abbot, MD       Followed by  . [START ON 12/22/2015] LORazepam (ATIVAN) tablet 1 mg  1 mg Oral Daily Hampton Abbot, MD      . magnesium hydroxide (MILK OF MAGNESIA) suspension 30 mL  30 mL Oral Daily PRN Patrecia Pour, NP      . Derrill Memo ON 12/20/2015] multivitamin with minerals tablet 1 tablet  1 tablet Oral Daily Patrecia Pour, NP      . multivitamin with minerals tablet 1 tablet  1 tablet Oral Daily Hampton Abbot, MD   1 tablet at 12/19/15 1353  . [START ON 12/20/2015] nicotine (NICODERM CQ - dosed in mg/24 hours) patch 21 mg  21 mg Transdermal Daily Patrecia Pour, NP      .  ondansetron (ZOFRAN-ODT) disintegrating tablet 4 mg  4 mg Oral Q6H PRN Hampton Abbot, MD      . Derrill Memo ON 12/20/2015] thiamine (VITAMIN B-1) tablet 100 mg  100 mg Oral Daily Hampton Abbot, MD      . traZODone (DESYREL) tablet 100 mg  100 mg Oral QHS Patrecia Pour, NP        Musculoskeletal: Strength & Muscle Tone: within normal limits Gait & Station: pt sitting Patient leans: Front  Psychiatric Specialty Exam: Physical Exam  Review of Systems  Psychiatric/Behavioral: Positive for depression and substance abuse. Negative for hallucinations, memory loss and suicidal ideas. The patient has insomnia. The patient is not nervous/anxious.   All other systems reviewed and are negative.   Blood pressure (!) 130/99, pulse 94, temperature 97.8 F (36.6 C), temperature source Oral, resp. rate 16, height 6' 1"  (1.854 m), weight 71.2 kg (157 lb).Body mass index is 20.71 kg/m.  General Appearance: Casual  Eye Contact:  Good  Speech:  Clear and Coherent and Normal Rate  Volume:  Normal  Mood:  Depressed  Affect:  Congruent and Depressed  Thought  Process:  Coherent, Goal Directed and Linear  Orientation:  Full (Time, Place, and Person)  Thought Content:  Racing thoughts, med management,symptoms, worries, concerns  Suicidal Thoughts:  No  Homicidal Thoughts:  No  Memory:  Immediate;   Good Recent;   Good Remote;   Fair  Judgement:  Good  Insight:  Good  Psychomotor Activity:  Normal  Concentration:  Concentration: Good and Attention Span: Good  Recall:  Good  Fund of Knowledge:  Good  Language:  Good  Akathisia:  No  Handed:  Right  AIMS (if indicated):     Assets:  Communication Skills Desire for Improvement Housing Resilience Social Support  ADL's:  Intact  Cognition:  WNL  Sleep:      Treatment Plan Summary: Alcohol abuse with alcohol-induced mood disorder (HCC) , unstable, warrants overnight OBS UNIT stay, managed as below:  Medications: -CIWA with Ativan protocol -Continue Prozac 23m po daily for MDD -Continue Trazodone 1045mpo qhs prn insomnia -Nicotine patch for smoking cessation -Abilify 62m162mo bid for severe racing thoughts  Disposition: Hold overnight in BHHLansingr safety and stabilization as well as provision of outpatient resources for psychiatry/counseling and referrals.   LauEthelene HalP 12/19/2015 7:31 PM

## 2015-12-19 NOTE — ED Triage Notes (Signed)
Pt states he lives with his father and his father is not doing well  Pt states he feels helpless  Pt states he has problems with depression  Denies SI/HI  Pt states he left the house and walked to the fire station to talk to them and they called EMS to come here

## 2015-12-19 NOTE — ED Notes (Signed)
[facility] order. Pt signed for personal property and property given to [facility] for transfer. Pt signed e-signature. Ambulatory off unit.

## 2015-12-19 NOTE — H&P (Signed)
[facility] OBS UNIT H&P   Patient Identification: Thomas Atkins MRN:  [id number] Principal Diagnosis: Alcohol abuse with alcohol-induced mood disorder [facility]) Diagnosis:        Patient Active Problem List   Diagnosis Date Noted  . Major depressive disorder, recurrent severe without psychotic features ([place]) [F33.2] 10/10/2015    Priority: High  . Alcohol abuse with alcohol-induced mood disorder ([place]) [F10.14] 05/31/2015    Priority: High  . Alcohol use disorder, severe, dependence ([place]) [F10.20] 05/30/2015    Priority: High    Total Time spent with patient: 45 minutes  Subjective:   Thomas Atkins is a 40 y.o. male patient admitted with alcohol abuse with alcohol induced mood disorder. Pt states "I have severe racing thoughts to the point I am not motivated. Alert, Oriented x 3, Denies suicidal/homicidal ideations, denies audio/visual hallucinations.   HPI:  I have reviewed and concur with HPI elements below, modified as follows: Thomas Atkins an 40 y.o.male. He presents to [facility] via EMS. He lives with his father who has health issues. Last night he decided to leave his home and walk to a fire station yesterday. Patient told someone at the fire station that he felt helpless; EMS was called. This morning patient continues to have feelings of helplessness. He reports depressive symptoms including crying spells, loss of interest in usual pleasures, guilt, and worthlessness. Patient is not sleeping well. His appetite is poor. Sts that he is not only concerned about his fathers health and heavy alcohol use. He also sts, "My girlfriend doesn't want me anymore either". Patient sts that it is so many other things that are stressing him out but he would not elaborate any further about those stressors.   Patient denies suicidal thoughts. However states, "Although I'm not going to kill myself I don't want to live anymore". States, "If I went to sleep and never woke up I would be ok with that". He  denies suicidal intent. He denies history of suicidal attempts or gestures. No homicidal ideation. No history of violent or aggressive behaviors. No current legal issues. Patient has a history of serving prison time. Denies psychosis. Patient receives outpatient services with [facility]. He has received Inpt treatment at [facility] (10/10/2015, 05/30/2015, 06/29/2013, 02/16/2013).   Patient reports heavy alcohol use. He started drinking at the age of 14. He reports daily use for several months. Patient drinks (2) to (10) 40 ounce beers per day. Last drink was last night. Patient reports a history of seizures. He is unsure of his last seizure. He reports current withdrawal symptoms (aggitation, nausea, and hot flashes).   Pt was transferred from the ED to [place]. Seen and chart reviewed as above on 12/19/2015 .   Past Psychiatric History: ETOH, MDD  Risk to Self: Is patient at risk for suicide?: No Risk to Others:   Prior Inpatient Therapy:   Prior Outpatient Therapy:    Past Medical History:      Past Medical History:  Diagnosis Date  . Alcoholism ([place])   . Anxiety   . Depression   . Seizure disorder ([place])   . Syncope          Past Surgical History:  Procedure Laterality Date  . SURGERY SCROTAL / TESTICULAR     Family History:       Family History  Problem Relation Age of Onset  . Heart disease Paternal Grandmother   . Bipolar disorder Father    Family Psychiatric  History: Unknown Social History:  History  Alcohol Use  . 14.4 oz/week  . 24 Cans of beer per week    Comment: 320 oz daily          History  Drug Use No    Comment: Denies drug use     Social History        Social History  . Marital status: Divorced    Spouse name: N/A  . Number of children: 2  . Years of education: N/A       Occupational History  . Unemployed          Social History Main Topics  . Smoking status: Current Every Day Smoker    Packs/day: 1.00     Years: 30.00    Types: Cigarettes  . Smokeless tobacco: Never Used     Comment: Patient refused cessation materials  . Alcohol use 14.4 oz/week    24 Cans of beer per week     Comment: 320 oz daily  . Drug use: No     Comment: Denies drug use  . Sexual activity: Not Asked       Other Topics Concern  . None      Social History Narrative  . None   Additional Social History:    Allergies:        Allergies  Allergen Reactions  . Bee Venom   . Lactose Intolerance (Gi) Diarrhea, Nausea Only and Other (See Comments)    Abdominal pain    . Ultram [Tramadol Hcl] Hives        Labs:  Lab Results Last 48 Hours        Results for orders placed or performed during the hospital encounter of 12/19/15 (from the past 48 hour(s))  Urine rapid drug screen (hosp performed)not at [facility]     Status: Abnormal   Collection Time: 12/19/15  2:03 AM  Result Value Ref Range   Opiates NONE DETECTED NONE DETECTED   Cocaine NONE DETECTED NONE DETECTED   Benzodiazepines POSITIVE (A) NONE DETECTED   Amphetamines NONE DETECTED NONE DETECTED   Tetrahydrocannabinol NONE DETECTED NONE DETECTED   Barbiturates NONE DETECTED NONE DETECTED    Comment:        DRUG SCREEN FOR MEDICAL PURPOSES ONLY.  IF CONFIRMATION IS NEEDED FOR ANY PURPOSE, NOTIFY LAB WITHIN 5 DAYS.        LOWEST DETECTABLE LIMITS FOR URINE DRUG SCREEN Drug Class       Cutoff (ng/mL) Amphetamine      1000 Barbiturate      200 Benzodiazepine   790 Tricyclics       383 Opiates          300 Cocaine          300 THC              50  Comprehensive metabolic panel     Status: Abnormal   Collection Time: 12/19/15  2:10 AM  Result Value Ref Range   Sodium 141 135 - 145 mmol/L   Potassium 3.9 3.5 - 5.1 mmol/L   Chloride 107 101 - 111 mmol/L   CO2 24 22 - 32 mmol/L   Glucose, Bld 92 65 - 99 mg/dL   BUN 10 6 - 20 mg/dL   Creatinine, Ser 0.75 0.61 - 1.24 mg/dL   Calcium 9.3 8.9 - 10.3 mg/dL    Total Protein 7.9 6.5 - 8.1 g/dL   Albumin 4.6 3.5 - 5.0 g/dL   AST 72 (H) 15 - 41  U/L   ALT 70 (H) 17 - 63 U/L   Alkaline Phosphatase 57 38 - 126 U/L   Total Bilirubin 0.7 0.3 - 1.2 mg/dL   GFR calc non Af Amer >60 >60 mL/min   GFR calc Af Amer >60 >60 mL/min    Comment: (NOTE) The eGFR has been calculated using the CKD EPI equation. This calculation has not been validated in all clinical situations. eGFR's persistently <60 mL/min signify possible Chronic Kidney Disease.   Anion gap 10 5 - 15  Ethanol     Status: Abnormal   Collection Time: 12/19/15  2:10 AM  Result Value Ref Range   Alcohol, Ethyl (B) 276 (H) <5 mg/dL    Comment:        LOWEST DETECTABLE LIMIT FOR SERUM ALCOHOL IS 5 mg/dL FOR MEDICAL PURPOSES ONLY  CBC with Diff     Status: Abnormal   Collection Time: 12/19/15  2:10 AM  Result Value Ref Range   WBC 6.9 4.0 - 10.5 K/uL   RBC 4.31 4.22 - 5.81 MIL/uL   Hemoglobin 14.4 13.0 - 17.0 g/dL   HCT 42.2 39.0 - 52.0 %   MCV 97.9 78.0 - 100.0 fL   MCH 33.4 26.0 - 34.0 pg   MCHC 34.1 30.0 - 36.0 g/dL   RDW 13.7 11.5 - 15.5 %   Platelets 123 (L) 150 - 400 K/uL   Neutrophils Relative % 41 %   Neutro Abs 2.8 1.7 - 7.7 K/uL   Lymphocytes Relative 45 %   Lymphs Abs 3.2 0.7 - 4.0 K/uL   Monocytes Relative 11 %   Monocytes Absolute 0.7 0.1 - 1.0 K/uL   Eosinophils Relative 3 %   Eosinophils Absolute 0.2 0.0 - 0.7 K/uL   Basophils Relative 0 %   Basophils Absolute 0.0 0.0 - 0.1 K/uL               Current Facility-Administered Medications  Medication Dose Route Frequency Provider Last Rate Last Dose  . acetaminophen (TYLENOL) tablet 650 mg  650 mg Oral Q6H PRN Patrecia Pour, NP      . alum & mag hydroxide-simeth (MAALOX/MYLANTA) 200-200-20 MG/5ML suspension 30 mL  30 mL Oral Q4H PRN Patrecia Pour, NP      . FLUoxetine (PROZAC) capsule 40 mg  40 mg Oral QHS Patrecia Pour, NP      . Derrill Memo ON 95/11/3265] folic acid (FOLVITE)  tablet 1 mg  1 mg Oral Daily Patrecia Pour, NP      . hydrOXYzine (ATARAX/VISTARIL) tablet 25 mg  25 mg Oral Q6H PRN Hampton Abbot, MD      . loperamide (IMODIUM) capsule 2-4 mg  2-4 mg Oral PRN Hampton Abbot, MD      . LORazepam (ATIVAN) tablet 1 mg  1 mg Oral QID Hampton Abbot, MD   1 mg at 12/19/15 1347   Followed by  . [START ON 12/20/2015] LORazepam (ATIVAN) tablet 1 mg  1 mg Oral TID Hampton Abbot, MD       Followed by  . [START ON 12/21/2015] LORazepam (ATIVAN) tablet 1 mg  1 mg Oral BID Hampton Abbot, MD       Followed by  . [START ON 12/22/2015] LORazepam (ATIVAN) tablet 1 mg  1 mg Oral Daily Hampton Abbot, MD      . magnesium hydroxide (MILK OF MAGNESIA) suspension 30 mL  30 mL Oral Daily PRN Patrecia Pour, NP      . Derrill Memo  ON 12/20/2015] multivitamin with minerals tablet 1 tablet  1 tablet Oral Daily Patrecia Pour, NP      . multivitamin with minerals tablet 1 tablet  1 tablet Oral Daily Hampton Abbot, MD   1 tablet at 12/19/15 1353  . [START ON 12/20/2015] nicotine (NICODERM CQ - dosed in mg/24 hours) patch 21 mg  21 mg Transdermal Daily Patrecia Pour, NP      . ondansetron (ZOFRAN-ODT) disintegrating tablet 4 mg  4 mg Oral Q6H PRN Hampton Abbot, MD      . Derrill Memo ON 12/20/2015] thiamine (VITAMIN B-1) tablet 100 mg  100 mg Oral Daily Hampton Abbot, MD      . traZODone (DESYREL) tablet 100 mg  100 mg Oral QHS Patrecia Pour, NP        Musculoskeletal: Strength & Muscle Tone: within normal limits Gait & Station: pt sitting Patient leans: Front  Psychiatric Specialty Exam: Physical Exam  Review of Systems  Psychiatric/Behavioral: Positive for depression and substance abuse. Negative for hallucinations, memory loss and suicidal ideas. The patient has insomnia. The patient is not nervous/anxious.   All other systems reviewed and are negative.   Blood pressure (!) 130/99, pulse 94, temperature 97.8 F (36.6 C), temperature source Oral, resp. rate 16, height 6' 1"  (1.854  m), weight 71.2 kg (157 lb).Body mass index is 20.71 kg/m.  General Appearance: Casual  Eye Contact:  Good  Speech:  Clear and Coherent and Normal Rate  Volume:  Normal  Mood:  Depressed  Affect:  Congruent and Depressed  Thought Process:  Coherent, Goal Directed and Linear  Orientation:  Full (Time, Place, and Person)  Thought Content:  Racing thoughts, med management,symptoms, worries, concerns  Suicidal Thoughts:  No  Homicidal Thoughts:  No  Memory:  Immediate;   Good Recent;   Good Remote;   Fair  Judgement:  Good  Insight:  Good  Psychomotor Activity:  Normal  Concentration:  Concentration: Good and Attention Span: Good  Recall:  Good  Fund of Knowledge:  Good  Language:  Good  Akathisia:  No  Handed:  Right  AIMS (if indicated):     Assets:  Communication Skills Desire for Improvement Housing Resilience Social Support  ADL's:  Intact  Cognition:  WNL  Sleep:      Treatment Plan Summary: Alcohol abuse with alcohol-induced mood disorder (HCC) , unstable, warrants overnight OBS UNIT stay, managed as below:  Medications: -CIWA with Ativan protocol -Continue Prozac 69m po daily for MDD -Continue Trazodone 1029mpo qhs prn insomnia -Nicotine patch for smoking cessation -Abilify 64m86mo bid for severe racing thoughts  Disposition: Hold overnight in [facility] safety and stabilization as well as provision of outpatient resources for psychiatry/counseling and referrals.   LauEthelene HalP 12/19/2015 7:31 PM

## 2015-12-19 NOTE — ED Notes (Signed)
[facility] or [facility]. Patient denies HI and AVH at this time. Plan of care discussed with patient. Patient voices no complaints or concerns at this time. Encouragement and support provided and safety maintain. Q 15 min safety checks in place and video monitoring.

## 2015-12-19 NOTE — ED Notes (Signed)
MD at bedside. 

## 2015-12-19 NOTE — ED Notes (Signed)
Patient escorted to shower for privacy and safety check performed for contraband and none found. 

## 2015-12-19 NOTE — ED Notes (Signed)
Pt given sandwich and sprite.  

## 2015-12-19 NOTE — BH Assessment (Signed)
[facility] Assessment Progress Note  Per Thedore MinsMojeed Akintayo, MD, this pt [facility].  Thomas Heinrichina Tate, RN, [facility] pt to Obs 2; they will be ready to receive pt at 11:30.  Pt has signed Voluntary Admission and Consent for Treatment, as well as Consent to Release Information [facility], and a notification call as been placed.  [facility].  Pt's nurse, Morrie Sheldonshley, has been notified, and agrees to send original paperwork along with pt via [facility] [place], and to call report to [phone] or [phone].  Doylene Canninghomas Anayeli Arel, MA Triage Specialist [phone]

## 2015-12-19 NOTE — ED Triage Notes (Signed)
Pt brought in by EMS because he has been drinking since 9 am  Pt states he has drank 4 40 oz beers today which is his norm  Pt is alert and oriented x 3   Pt states he wants to start rehab

## 2015-12-19 NOTE — Progress Notes (Signed)
Pt is in bed watching TV at shift change.  Pt asking for list of meds he can have tonight.  Pt is restless and fidgety but makes good eye contact.  Pt currently denies pain or discomfort.  Pt sts his withdrawal symptoms include tremors and being fidgety.  Pt scores his anxiety a 1-2. Pt denies SI, HI and AVH and verbally contracts for safety. Pt given snacks and medications.  Pt continuously monitored on unit for safety except when in the bathroom.  Pt given emotional support and encouragement. Pt remains safe on unit.

## 2015-12-19 NOTE — BH Assessment (Addendum)
Assessment Note  Thomas Atkins is an 40 y.o. male. He presents to [facility] via EMS. He lives with his father who has health issues. Last night he decided to leave his home and walk to a fire station yesterday. Patient told someone at the fire station that he felt helpless; EMS was called. This morning patient continues to have feelings of helplessness. He reports depressive symptoms including crying spells, loss of interest in usual pleasures, guilt, and worthlessness. Patient is not sleeping well. His appetite is poor. Sts that he is not only concerned about his fathers health and heavy alcohol use. He also sts, "My girlfriend doesn't want me anymore either". Patient sts that it is so many other things that are stressing him out but he would not elaborate any further about those stressors.   Patient denies suicidal thoughts. However sts, "Although I'm not going to kill myself I don't want to live anymore". Sts, "If I went to sleep and never woke up I would be ok with that". He denies suicidal intent. He denies history of suicidal attempts or gestures. No HI. No history of violent or aggressive behaviors. No current legal issues. Patient has a history of serving prison time. No AVH's. Patient receives outpatient services with [facility]. He has received Inpt treatment at [facility] (10/10/2015, 05/30/2015, 06/29/2013, 02/16/2013).   Patient reports heavy alcohol use. He started drinking at the age of 89. He reports daily use for several months. Patient drinks (2) to (10) 40 ounce beers per day. Last drink was last night. Patient reports a history of seizures. He is unsure of his last seizure. He reports current withdrawal symptoms (aggitation, nausea, and hot flashes).    Diagnosis: Major Depressive Disorder, Recurrent, Severe, without psychotic features and Alcohol Abuse  Past Medical History:  Past Medical History:  Diagnosis Date  . Alcoholism (HCC)   . Anxiety   . Depression   . Seizure disorder (HCC)   . Syncope      Past Surgical History:  Procedure Laterality Date  . SURGERY SCROTAL / TESTICULAR      Family History:  Family History  Problem Relation Age of Onset  . Heart disease Paternal Grandmother   . Bipolar disorder Father     Social History:  reports that he has been smoking Cigarettes.  He has been smoking about 0.50 packs per day. He has never used smokeless tobacco. He reports that he drinks about 7.2 oz of alcohol per week . He reports that he uses drugs, including Marijuana.  Additional Social History:  Alcohol / Drug Use Pain Medications: See PTA Prescriptions: see MAr Over the Counter: See PTA History of alcohol / drug use?: Yes Longest period of sobriety (when/how long): 9 months (while in prison) Negative Consequences of Use: Financial, Legal, Personal relationships, Work / School Withdrawal Symptoms: Tremors, Nausea / Vomiting Substance #1 Name of Substance 1: Alcohol  1 - Age of First Use: 40 yrs old  1 - Amount (size/oz): (2) to (10) 40 ounce beers  1 - Frequency: daily  1 - Duration: several months  1 - Last Use / Amount: "Last night"  CIWA: CIWA-Ar BP: 106/76 Pulse Rate: 80 COWS:    Allergies:  Allergies  Allergen Reactions  . Bee Venom   . Lactose Intolerance (Gi) Diarrhea, Nausea Only and Other (See Comments)    Abdominal pain    . Ultram [Tramadol Hcl] Hives         Home Medications:  (Not in a hospital admission)  OB/GYN Status:  No LMP for male patient.  General Assessment Data Location of Assessment: [facility] ED TTS Assessment: In system Is this a Tele or Face-to-Face Assessment?: Face-to-Face Is this an Initial Assessment or a Re-assessment for this encounter?: Initial Assessment Marital status: Single Maiden name:  (n/a) Is patient pregnant?: No Pregnancy Status: No Living Arrangements: Parent Can pt return to current living arrangement?: Yes Admission Status: Voluntary Is patient capable of signing voluntary admission?: Yes Referral  Source: Self/Family/Friend Insurance type:  (Self Pay )  Medical Screening Exam [facility] Walk-in ONLY) Medical Exam completed: No Reason for MSE not completed:  (n/a)  Crisis Care Plan Living Arrangements: Parent Legal Guardian:  (no legal guardian ) Name of Psychiatrist:  Museum/gallery curator ) Name of Therapist:  Museum/gallery curator )  Education Status Is patient currently in school?: No Current Grade:  (n/a) Highest grade of school patient has completed:  (GED) Name of school:  (n/a) Contact person:  (n/a)  Risk to self with the past 6 months Suicidal Ideation: Yes-Currently Present Has patient been a risk to self within the past 6 months prior to admission? : Yes Suicidal Intent: No Has patient had any suicidal intent within the past 6 months prior to admission? : No Is patient at risk for suicide?: Yes Suicidal Plan?: No Has patient had any suicidal plan within the past 6 months prior to admission? : No Access to Means: No What has been your use of drugs/alcohol within the last 12 months?:  (patient reports heavy alcohol  use ) Previous Attempts/Gestures: No How many times?:  (n/a) Other Self Harm Risks:  (patient denies ) Triggers for Past Attempts:  (no previous suicide attempts or gestures ) Intentional Self Injurious Behavior: None Family Suicide History: No Recent stressful life event(s): Other (Comment), Conflict (Comment) (relationship conflict with g-friend, alcoholism, "Life") Persecutory voices/beliefs?: No Depression: Yes Depression Symptoms: Feeling angry/irritable, Guilt, Fatigue, Loss of interest in usual pleasures, Feeling worthless/self pity, Isolating, Tearfulness, Insomnia, Despondent Substance abuse history and/or treatment for substance abuse?: No Suicide prevention information given to non-admitted patients: Not applicable  Risk to Others within the past 6 months Homicidal Ideation: No Does patient have any lifetime risk of violence toward others beyond the six months  prior to admission? : No Thoughts of Harm to Others: No Current Homicidal Intent: No Current Homicidal Plan: No Access to Homicidal Means: No Identified Victim:  (n/a) History of harm to others?: No Assessment of Violence: None Noted Violent Behavior Description:  (patient is calm and cooperative ) Does patient have access to weapons?: No Criminal Charges Pending?: No Does patient have a court date: No Is patient on probation?: No  Psychosis Hallucinations: None noted Delusions: None noted  Mental Status Report Appearance/Hygiene: Disheveled Eye Contact: Good Motor Activity: Freedom of movement Speech: Logical/coherent Level of Consciousness: Alert Mood: Depressed Affect: Appropriate to circumstance Anxiety Level: None Thought Processes: Coherent, Relevant Judgement: Impaired Orientation: Person, Place, Situation, Time Obsessive Compulsive Thoughts/Behaviors: None  Cognitive Functioning Concentration: Decreased Memory: Recent Intact, Remote Intact IQ: Average Insight: Poor Impulse Control: Poor Appetite: Poor Weight Loss:  (patient reports weight loss but unsure of how much ) Weight Gain:  (none reported) Sleep: Decreased Total Hours of Sleep:  (varies- 4 to 6 hrs per night ) Vegetative Symptoms: None  ADLScreening [facility] Assessment Services) Patient's cognitive ability adequate to safely complete daily activities?: Yes Patient able to express need for assistance with ADLs?: Yes Independently performs ADLs?: Yes (appropriate for developmental age)  Prior Inpatient Therapy Prior Inpatient  Therapy: Yes Prior Therapy Dates:  ("I can't remember...maybe a few months ago") Prior Therapy Facilty/Provider(s):  [facility] Reason for Treatment:  (alcohol detox and depression)  Prior Outpatient Therapy Prior Outpatient Therapy: Yes Prior Therapy Dates:  (current; [facility]") Prior Therapy Facilty/Provider(s):  ([facility]) Does patient have an  ACCT team?: No Does patient have Intensive In-House Services?  : No Does patient have [facility] services? : Yes Does patient have P4CC services?: No  ADL Screening (condition at time of admission) Patient's cognitive ability adequate to safely complete daily activities?: Yes Is the patient deaf or have difficulty hearing?: No Does the patient have difficulty seeing, even when wearing glasses/contacts?: No Does the patient have difficulty concentrating, remembering, or making decisions?: No Patient able to express need for assistance with ADLs?: Yes Does the patient have difficulty dressing or bathing?: No Independently performs ADLs?: Yes (appropriate for developmental age) Does the patient have difficulty walking or climbing stairs?: No Weakness of Legs: None Weakness of Arms/Hands: None  Home Assistive Devices/Equipment Home Assistive Devices/Equipment: None    Abuse/Neglect Assessment (Assessment to be complete while patient is alone) Physical Abuse: Denies Verbal Abuse: Denies Sexual Abuse: Denies Exploitation of patient/patient's resources: Denies Self-Neglect: Denies Values / Beliefs Cultural Requests During Hospitalization: None Spiritual Requests During Hospitalization: None   Advance Directives (For Healthcare) Does patient have an advance directive?: No Would patient like information on creating an advanced directive?: No - patient declined information Nutrition Screen- [facility] Adult/[facility] Patient's home diet: Regular  Additional Information 1:1 In Past 12 Months?: No CIRT Risk: No Elopement Risk: No Does patient have medical clearance?: Yes     Disposition:  Disposition Initial Assessment Completed for this Encounter: Yes Disposition of Patient: Other dispositions Nanine Means(Jamison Lord, DNP recommends OBS admission)  On Site Evaluation by:   Reviewed with Physician:  Nanine MeansJamison Lord, DNP Melynda RipplePerry, [facility] 12/19/2015 10:10 AM

## 2015-12-19 NOTE — Progress Notes (Signed)
Admission Note: Admitted patient, a 40 y/o male, to [facility]; pt [facility].  He reports that he dinks 8 to 10 forty-ounce beers daily; he denies illicit drug use; Patient also denies suicidal and homicidal ideation and AVH;  He is pleasant and cooperative; mood anxious;  Pt fidgety, restless and has obvious tremor. He is on CIWA Protocol.  He reports no pain.  Patient states that he has not been able to sleep well lately and that his appetite has been poor.  He says that he lives with his father who is supportive and receives Psychiatric care at [facility];  Patient and belongings checked for contraband and belongings locked up in locker by Security.  Skin assessment done and witnessed by two staff members with no abnormalities noted.  Patient oriented to unit and plan of care; meal provided. Encouraged him to seek assistance with needs/concerns; emotional support given.

## 2015-12-20 DIAGNOSIS — Z818 Family history of other mental and behavioral disorders: Secondary | ICD-10-CM | POA: Diagnosis not present

## 2015-12-20 DIAGNOSIS — F332 Major depressive disorder, recurrent severe without psychotic features: Secondary | ICD-10-CM | POA: Diagnosis not present

## 2015-12-20 DIAGNOSIS — Z8249 Family history of ischemic heart disease and other diseases of the circulatory system: Secondary | ICD-10-CM | POA: Diagnosis not present

## 2015-12-20 DIAGNOSIS — Z79899 Other long term (current) drug therapy: Secondary | ICD-10-CM | POA: Diagnosis not present

## 2015-12-20 DIAGNOSIS — F1014 Alcohol abuse with alcohol-induced mood disorder: Secondary | ICD-10-CM | POA: Diagnosis not present

## 2015-12-20 MED ORDER — ARIPIPRAZOLE 2 MG PO TABS: 2.0000 mg | ORAL_TABLET | Freq: Two times a day (BID) | ORAL | 0 refills | Status: DC

## 2015-12-20 MED ORDER — FLUOXETINE HCL 40 MG PO CAPS: 40.0000 mg | ORAL_CAPSULE | Freq: Every day | ORAL | 0 refills | Status: DC

## 2015-12-20 MED ORDER — NICOTINE 21 MG/24HR TD PT24: 21.0000 mg | MEDICATED_PATCH | Freq: Every day | TRANSDERMAL | 0 refills | Status: DC

## 2015-12-20 MED ORDER — TRAZODONE HCL 100 MG PO TABS: 150.0000 mg | ORAL_TABLET | Freq: Every day | ORAL | 0 refills | Status: DC

## 2015-12-20 NOTE — Progress Notes (Signed)
D:Pt c/o racing thoughts, nausea and not being able to sleep. Pt is pleasant as he talks about physical symptoms and how he went to the fire station asking for help. A:Offered support and encouragement. Gave prn medication for nausea, along with Gingerale and lavender for sleep.  R:Pt is currently resting with his eyes closed and frequent movements. He denied si and hi. Safety maintained in the OBS unit.

## 2015-12-20 NOTE — Progress Notes (Signed)
Pt d/c from the observation unit. All items returned. D/C instructions given, prescriptions given and bus passes given. Pt denies si and hi.

## 2015-12-20 NOTE — Discharge Summary (Signed)
[facility] OBS Unit Discharge Summary  Patient Identification: Thomas Atkins MRN:  [id number] Principal Diagnosis: Alcohol abuse with alcohol-induced mood disorder [facility]) Diagnosis:        Patient Active Problem List   Diagnosis Date Noted  . Major depressive disorder, recurrent severe without psychotic features ([place]) [F33.2] 10/10/2015    Priority: High  . Alcohol abuse with alcohol-induced mood disorder ([place]) [F10.14] 05/31/2015    Priority: High  . Alcohol use disorder, severe, dependence ([place]) [F10.20] 05/30/2015    Priority: High    Total Time spent with patient: 45 minutes  Subjective:   Thomas Atkins is a 39 y.o. male patient admitted with alcohol abuse with alcohol induced mood disorder. Pt states " feel much better today and actually slept pretty well with the Abilify. I am ready to leave and want to go to [facility] for outpatient treatment."Alert, Oriented x 3, Denies suicidal/homicidal ideations, denies audio/visual hallucinations. Calm and  Cooperative during assessment.  HPI:  I have reviewed and concur with HPI elements below, modified as follows: Thomas Atkins an 40 y.o.male. He presents to [facility] via EMS. He lives with his father who has health issues. Last night he decided to leave his home and walk to a fire station yesterday. Patient told someone at the fire station that he felt helpless; EMS was called. This morning patient continues to have feelings of helplessness. He reports depressive symptoms including crying spells, loss of interest in usual pleasures, guilt, and worthlessness. Patient is not sleeping well. His appetite is poor. Sts that he is not only concerned about his fathers health and heavy alcohol use. He also sts, "My girlfriend doesn't want me anymore either". Patient sts that it is so many other things that are stressing him out but he would not elaborate any further about those stressors.   Patient denies suicidal thoughts. However states, "Although I'm  not going to kill myself I don't want to live anymore". States, "If I went to sleep and never woke up I would be ok with that". He denies suicidal intent. He denies history of suicidal attempts or gestures. No homicidal ideation. No history of violent or aggressive behaviors. No current legal issues. Patient has a history of serving prison time. Denies psychosis. Patient receives outpatient services with [facility]. He has received Inpt treatment at [facility] (10/10/2015, 05/30/2015, 06/29/2013, 02/16/2013).   Patient reports heavy alcohol use. He started drinking at the age of 49. He reports daily use for several months. Patient drinks (2) to (10) 40 ounce beers per day. Last drink was last night. Patient reports a history of seizures. He is unsure of his last seizure. He reports current withdrawal symptoms (aggitation, nausea, and hot flashes).   Pt spent the night in the observation unit without incident. Cooperative and pleasant with staff. Pt has support at home, lives with mother/father. Pt wants to follow up with [facility] services upon discharge.   Past Psychiatric History: ETOH, MDD  Risk to Self: Is patient at risk for suicide?: No Risk to Others:  No Prior Inpatient Therapy:   Prior Outpatient Therapy:    Past Medical History:      Past Medical History:  Diagnosis Date  . Alcoholism ([place])   . Anxiety   . Depression   . Seizure disorder ([place])   . Syncope          Past Surgical History:  Procedure Laterality Date  . SURGERY SCROTAL / TESTICULAR     Family History:  Family History  Problem Relation Age of Onset  . Heart disease Paternal Grandmother   . Bipolar disorder Father    Family Psychiatric  History: MDD Social History:      History  Alcohol Use  . 14.4 oz/week  . 24 Cans of beer per week    Comment: 320 oz daily          History  Drug Use No    Comment: Denies drug use     Social History        Social History  . Marital status: Divorced     Spouse name: N/A  . Number of children: 2  . Years of education: N/A       Occupational History  . Unemployed          Social History Main Topics  . Smoking status: Current Every Day Smoker    Packs/day: 1.00    Years: 30.00    Types: Cigarettes  . Smokeless tobacco: Never Used     Comment: Patient refused cessation materials  . Alcohol use 14.4 oz/week    24 Cans of beer per week     Comment: 320 oz daily  . Drug use: No     Comment: Denies drug use  . Sexual activity: Not Asked       Other Topics Concern  . None      Social History Narrative  . None   Additional Social History:    Allergies:        Allergies  Allergen Reactions  . Bee Venom   . Lactose Intolerance (Gi) Diarrhea, Nausea Only and Other (See Comments)    Abdominal pain    . Ultram [Tramadol Hcl] Hives        Labs:  Lab Results Last 48 Hours        Results for orders placed or performed during the hospital encounter of 12/19/15 (from the past 48 hour(s))  Urine rapid drug screen (hosp performed)not at [facility]     Status: Abnormal   Collection Time: 12/19/15  2:03 AM  Result Value Ref Range   Opiates NONE DETECTED NONE DETECTED   Cocaine NONE DETECTED NONE DETECTED   Benzodiazepines POSITIVE (A) NONE DETECTED   Amphetamines NONE DETECTED NONE DETECTED   Tetrahydrocannabinol NONE DETECTED NONE DETECTED   Barbiturates NONE DETECTED NONE DETECTED    Comment:        DRUG SCREEN FOR MEDICAL PURPOSES ONLY.  IF CONFIRMATION IS NEEDED FOR ANY PURPOSE, NOTIFY LAB WITHIN 5 DAYS.        LOWEST DETECTABLE LIMITS FOR URINE DRUG SCREEN Drug Class       Cutoff (ng/mL) Amphetamine      1000 Barbiturate      200 Benzodiazepine   607 Tricyclics       371 Opiates          300 Cocaine          300 THC              50  Comprehensive metabolic panel     Status: Abnormal   Collection Time: 12/19/15  2:10 AM  Result Value Ref Range   Sodium 141 135 - 145  mmol/L   Potassium 3.9 3.5 - 5.1 mmol/L   Chloride 107 101 - 111 mmol/L   CO2 24 22 - 32 mmol/L   Glucose, Bld 92 65 - 99 mg/dL   BUN 10 6 - 20 mg/dL   Creatinine, Ser 0.75 0.61 -  1.24 mg/dL   Calcium 9.3 8.9 - 10.3 mg/dL   Total Protein 7.9 6.5 - 8.1 g/dL   Albumin 4.6 3.5 - 5.0 g/dL   AST 72 (H) 15 - 41 U/L   ALT 70 (H) 17 - 63 U/L   Alkaline Phosphatase 57 38 - 126 U/L   Total Bilirubin 0.7 0.3 - 1.2 mg/dL   GFR calc non Af Amer >60 >60 mL/min   GFR calc Af Amer >60 >60 mL/min    Comment: (NOTE) The eGFR has been calculated using the CKD EPI equation. This calculation has not been validated in all clinical situations. eGFR's persistently <60 mL/min signify possible Chronic Kidney Disease.   Anion gap 10 5 - 15  Ethanol     Status: Abnormal   Collection Time: 12/19/15  2:10 AM  Result Value Ref Range   Alcohol, Ethyl (B) 276 (H) <5 mg/dL    Comment:        LOWEST DETECTABLE LIMIT FOR SERUM ALCOHOL IS 5 mg/dL FOR MEDICAL PURPOSES ONLY  CBC with Diff     Status: Abnormal   Collection Time: 12/19/15  2:10 AM  Result Value Ref Range   WBC 6.9 4.0 - 10.5 K/uL   RBC 4.31 4.22 - 5.81 MIL/uL   Hemoglobin 14.4 13.0 - 17.0 g/dL   HCT 42.2 39.0 - 52.0 %   MCV 97.9 78.0 - 100.0 fL   MCH 33.4 26.0 - 34.0 pg   MCHC 34.1 30.0 - 36.0 g/dL   RDW 13.7 11.5 - 15.5 %   Platelets 123 (L) 150 - 400 K/uL   Neutrophils Relative % 41 %   Neutro Abs 2.8 1.7 - 7.7 K/uL   Lymphocytes Relative 45 %   Lymphs Abs 3.2 0.7 - 4.0 K/uL   Monocytes Relative 11 %   Monocytes Absolute 0.7 0.1 - 1.0 K/uL   Eosinophils Relative 3 %   Eosinophils Absolute 0.2 0.0 - 0.7 K/uL   Basophils Relative 0 %   Basophils Absolute 0.0 0.0 - 0.1 K/uL               Current Facility-Administered Medications  Medication Dose Route Frequency Provider Last Rate Last Dose  . acetaminophen (TYLENOL) tablet 650 mg  650 mg Oral Q6H PRN Patrecia Pour, NP      . alum & mag  hydroxide-simeth (MAALOX/MYLANTA) 200-200-20 MG/5ML suspension 30 mL  30 mL Oral Q4H PRN Patrecia Pour, NP      . FLUoxetine (PROZAC) capsule 40 mg  40 mg Oral QHS Patrecia Pour, NP      . Derrill Memo ON 66/59/9357] folic acid (FOLVITE) tablet 1 mg  1 mg Oral Daily Patrecia Pour, NP      . hydrOXYzine (ATARAX/VISTARIL) tablet 25 mg  25 mg Oral Q6H PRN Hampton Abbot, MD      . loperamide (IMODIUM) capsule 2-4 mg  2-4 mg Oral PRN Hampton Abbot, MD      . LORazepam (ATIVAN) tablet 1 mg  1 mg Oral QID Hampton Abbot, MD   1 mg at 12/19/15 1347   Followed by  . [START ON 12/20/2015] LORazepam (ATIVAN) tablet 1 mg  1 mg Oral TID Hampton Abbot, MD       Followed by  . [START ON 12/21/2015] LORazepam (ATIVAN) tablet 1 mg  1 mg Oral BID Hampton Abbot, MD       Followed by  . [START ON 12/22/2015] LORazepam (ATIVAN) tablet 1 mg  1 mg Oral  Daily Hampton Abbot, MD      . magnesium hydroxide (MILK OF MAGNESIA) suspension 30 mL  30 mL Oral Daily PRN Patrecia Pour, NP      . Derrill Memo ON 12/20/2015] multivitamin with minerals tablet 1 tablet  1 tablet Oral Daily Patrecia Pour, NP      . multivitamin with minerals tablet 1 tablet  1 tablet Oral Daily Hampton Abbot, MD   1 tablet at 12/19/15 1353  . [START ON 12/20/2015] nicotine (NICODERM CQ - dosed in mg/24 hours) patch 21 mg  21 mg Transdermal Daily Patrecia Pour, NP      . ondansetron (ZOFRAN-ODT) disintegrating tablet 4 mg  4 mg Oral Q6H PRN Hampton Abbot, MD      . Derrill Memo ON 12/20/2015] thiamine (VITAMIN B-1) tablet 100 mg  100 mg Oral Daily Hampton Abbot, MD      . traZODone (DESYREL) tablet 100 mg  100 mg Oral QHS Patrecia Pour, NP        Musculoskeletal: Strength & Muscle Tone: within normal limits Gait & Station: pt sitting Patient leans: Front  Psychiatric Specialty Exam: Physical Exam  Review of Systems  Psychiatric/Behavioral: Positive for depression and substance abuse. Negative for hallucinations, memory loss and suicidal ideas. The  patient has insomnia. The patient is not nervous/anxious.   All other systems reviewed and are negative.   BP (!) 129/96 (BP Location: Left Arm)   Pulse 91   Temp 98.3 F (36.8 C)   Resp 16   Ht _0  (1.854 m)   Wt 71.2 kg (157 lb)   BMI 20.71 kg/m    General Appearance: Casual  Eye Contact:  Good  Speech:  Clear and Coherent and Normal Rate  Volume:  Normal  Mood:  Still with depression but much better today  Affect:  Congruent   Thought Process:  Coherent, Goal Directed and Linear  Orientation:  Full (Time, Place, and Person)  Thought Content:  Racing thoughts, med management, worries, concerns, has plan to try sleep hygiene methods to help with racing thoughts at night.  Suicidal Thoughts:  No  Homicidal Thoughts:  No  Memory:  Immediate;   Good Recent;   Good Remote;   Fair  Judgement:  Good  Insight:  Good  Psychomotor Activity:  Normal  Concentration:  Concentration: Good and Attention Span: Good  Recall:  Good  Fund of Knowledge:  Good  Language:  Good  Akathisia:  No  Handed:  Right  AIMS (if indicated):     Assets:  Communication Skills Desire for Improvement Housing Resilience Social Support  ADL's:  Intact  Cognition:  WNL  Sleep:      Treatment Plan Summary: Alcohol abuse with alcohol-induced mood disorder ([place]) Stable for discharge home. Pt to follow up with [facility] [place]. for therapy and medication management.   Medications: -Continue Thiamine 100 mg daily. -Continue Prozac 31m po daily for MDD -Start Trazodone 1579mpo qhs prn insomnia -Nicotine patch for smoking cessation -Abilify 56m13mo bid for severe racing thoughts  Disposition: Pt is stable for discharge home with outpatient support and therapy in place . [facility].   LauEthelene HalP 12/19/2015 10:31AM

## 2015-12-20 NOTE — [facility] Counselor (Signed)
This Probation officer met with pt this morning. Pt states that he is experiencing nausea while he is detoxing from increased alcohol use. Pt states that he "feels like crap" , but is denying SI/HI/AVH. Pt states that he still would like to go to [facility] for a follow up post discharge from this unit.

## 2016-01-14 ENCOUNTER — Encounter (HOSPITAL_COMMUNITY): Payer: Self-pay | Admitting: Emergency Medicine

## 2016-01-14 ENCOUNTER — Ambulatory Visit (INDEPENDENT_AMBULATORY_CARE_PROVIDER_SITE_OTHER): Payer: Self-pay

## 2016-01-14 ENCOUNTER — Ambulatory Visit (HOSPITAL_COMMUNITY)
Admission: EM | Admit: 2016-01-14 | Discharge: 2016-01-14 | Disposition: A | Discharge status: Home / Self Care | Payer: Self-pay | Attending: Family Medicine | Admitting: Family Medicine

## 2016-01-14 DIAGNOSIS — M79605 Pain in left leg: Secondary | ICD-10-CM

## 2016-01-14 DIAGNOSIS — S8992XA Unspecified injury of left lower leg, initial encounter: Secondary | ICD-10-CM

## 2016-01-14 DIAGNOSIS — S8012XA Contusion of left lower leg, initial encounter: Secondary | ICD-10-CM

## 2016-01-14 MED ORDER — CEPHALEXIN 500 MG PO CAPS: 500.0000 mg | ORAL_CAPSULE | Freq: Three times a day (TID) | ORAL | 0 refills | Status: DC

## 2016-01-14 MED ORDER — NAPROXEN 500 MG PO TABS: 500.0000 mg | ORAL_TABLET | Freq: Two times a day (BID) | ORAL | 0 refills | Status: DC

## 2016-01-14 MED ORDER — MUPIROCIN 2 % EX OINT: TOPICAL_OINTMENT | CUTANEOUS | 0 refills | Status: DC

## 2016-01-14 NOTE — Discharge Instructions (Signed)
°  Be sure to keep the small scrap clean with soap and water.  Apply the topical antibiotic ointment and bandage at least twice daily.   Apply an ace wrap to lower leg and keep leg elevated to help reduce the swelling and pain.

## 2016-01-14 NOTE — ED Triage Notes (Signed)
[facility] a [facility] of left lower leg pain x 1 week. The patient stated that he hit his left lower leg on a funnel cake mixer about 1 week ago. The patient's leg appeared swollen and red. The patient had good PMS distal to the injury.

## 2016-01-14 NOTE — ED Provider Notes (Signed)
CSN: [id number]     Arrival date & time 01/14/16  1043 History   First MD Initiated Contact with Patient 01/14/16 1256     Chief Complaint  Patient presents with  . Leg Pain   (Consider location/radiation/quality/duration/timing/severity/associated sxs/prior Treatment) HPI Jonah BlueMark P Burkhammer is a 40 y.o. male presenting to UC with c/o gradually worsening Left lower leg pain, swelling, and bruising.  Pt notes he hit his Left lower leg on a funnel cake mixer about 1 week ago. Initial pain and swelling seemed to resolve but swelling and bruising has come back over the last 2-3 days.  Bruising has now developed in his Left foot with mild soreness.  He has not tried anything for the pain. Family member with pt is concerned it may be getting infected. Pt denies fever, chills, n/v/d.      Past Medical History:  Diagnosis Date  . Alcoholism ([facility])   . Anxiety   . Depression   . Seizure disorder ([facility])   . Syncope    Past Surgical History:  Procedure Laterality Date  . SURGERY SCROTAL / TESTICULAR     Family History  Problem Relation Age of Onset  . Heart disease Paternal Grandmother   . Bipolar disorder Father    Social History  Substance Use Topics  . Smoking status: Current Every Day Smoker    Packs/day: 1.00    Years: 30.00    Types: Cigarettes  . Smokeless tobacco: Never Used     Comment: Patient refused cessation materials  . Alcohol use 14.4 oz/week    24 Cans of beer per week     Comment: 320 oz daily    Review of Systems  Constitutional: Negative for chills and fever.  Gastrointestinal: Negative for diarrhea, nausea and vomiting.  Musculoskeletal: Positive for arthralgias, gait problem, joint swelling and myalgias.       Left lower leg  Skin: Positive for color change and wound. Negative for rash.  Neurological: Negative for weakness and numbness.    Allergies  Bee venom; Lactose intolerance (gi); and Ultram [tramadol hcl]  Home Medications   Prior to Admission  medications   Medication Sig Start Date End Date Taking? Authorizing Provider  FLUoxetine (PROZAC) 40 MG capsule Take 1 capsule (40 mg total) by mouth at bedtime. 12/20/15  Yes Laveda AbbeLaurie Britton Parks, NP  traZODone (DESYREL) 100 MG tablet Take 1.5 tablets (150 mg total) by mouth at bedtime. 12/20/15  Yes Laveda AbbeLaurie Britton Parks, NP  ARIPiprazole (ABILIFY) 2 MG tablet Take 1 tablet (2 mg total) by mouth 2 (two) times daily. 12/20/15   Laveda AbbeLaurie Britton Parks, NP  cephALEXin (KEFLEX) 500 MG capsule Take 1 capsule (500 mg total) by mouth 3 (three) times daily. For 7 days 01/14/16   Junius FinnerErin O'Malley, PA-C  mupirocin ointment (BACTROBAN) 2 % Apply to sore on lower leg twice daily for 5 days 01/14/16   Junius FinnerErin O'Malley, PA-C  naproxen (NAPROSYN) 500 MG tablet Take 1 tablet (500 mg total) by mouth 2 (two) times daily. 01/14/16   Junius FinnerErin O'Malley, PA-C  nicotine (NICODERM CQ - DOSED IN MG/24 HOURS) 21 mg/24hr patch Place 1 patch (21 mg total) onto the skin daily. 12/21/15   Laveda AbbeLaurie Britton Parks, NP   Meds Ordered and Administered this Visit  Medications - No data to display  BP 131/100 (BP Location: Left Arm)   Pulse 94   Temp 98.6 F (37 C) (Oral)   Resp 20   SpO2 100%  No data found.  Physical Exam  Constitutional: He is oriented to person, place, and time. He appears well-developed and well-nourished.  HENT:  Head: Normocephalic and atraumatic.  Eyes: EOM are normal.  Neck: Normal range of motion.  Cardiovascular: Normal rate.   Pulses:      Dorsalis pedis pulses are 2+ on the left side.       Posterior tibial pulses are 2+ on the left side.  Pulmonary/Chest: Effort normal.  Musculoskeletal: Normal range of motion. He exhibits edema and tenderness.  Left lower anterior leg: mild edema with tenderness over tibia. Calf is soft, non-tender. Full ROM knee and ankle.  Neurological: He is alert and oriented to person, place, and time.  Skin: Skin is warm and dry. Capillary refill takes less than 2 seconds.   Left lower leg, anterior aspect: 0.5cm superficial abrasion over 2-3cm hematoma with ecchymosis.  No red streaking or warmth. Left foot: mild ecchymosis to medial plantar aspect.  Psychiatric: He has a normal mood and affect. His behavior is normal.  Nursing note and vitals reviewed.   Urgent Care Course   Clinical Course     Procedures (including critical care time)  Labs Review Labs Reviewed - No data to display  Imaging Review Dg Tibia/fibula Left  Result Date: 01/14/2016 CLINICAL DATA:  Pain following injury 1 week prior EXAM: LEFT TIBIA AND FIBULA - 2 VIEW COMPARISON:  None. FINDINGS: Frontal and lateral views were obtained. There is no fracture or dislocation. The joint spaces appear normal. No abnormal periosteal reaction. IMPRESSION: No fracture or dislocation.  No evident arthropathy. Electronically Signed   By: Bretta BangWilliam  Woodruff III M.D.   On: 01/14/2016 13:36    MDM   1. Left leg pain   2. Left leg injury, initial encounter   3. Leg hematoma, left, initial encounter    Hematoma with ecchymosis and 0.5cm superficial abrasion to Left lower anterior leg. Calf is soft, non-tender.  Bruising of foot likely due to gravity from shin hematoma. Good color and pulses distal to hematoma.  Plain films: negative for fracture or dislocation. No body abnormality to tibia.    Offered bandage and ace wrap. Pt declined stating he has both at home. Rx: Keflex, mupirocin ointment and naproxen Home care instructions for hematoma provided.    Junius Finnerrin O'Malley, PA-C 01/14/16 1428

## 2016-09-19 ENCOUNTER — Encounter (HOSPITAL_COMMUNITY): Payer: Self-pay | Admitting: Emergency Medicine

## 2016-09-19 ENCOUNTER — Emergency Department (HOSPITAL_COMMUNITY)
Admission: EM | Admit: 2016-09-19 | Discharge: 2016-09-19 | Disposition: A | Discharge status: Home / Self Care | Payer: Self-pay | Attending: Emergency Medicine | Admitting: Emergency Medicine

## 2016-09-19 ENCOUNTER — Emergency Department (HOSPITAL_COMMUNITY): Payer: Self-pay

## 2016-09-19 DIAGNOSIS — S20212A Contusion of left front wall of thorax, initial encounter: Secondary | ICD-10-CM | POA: Insufficient documentation

## 2016-09-19 DIAGNOSIS — Z23 Encounter for immunization: Secondary | ICD-10-CM | POA: Insufficient documentation

## 2016-09-19 DIAGNOSIS — M79642 Pain in left hand: Secondary | ICD-10-CM | POA: Insufficient documentation

## 2016-09-19 DIAGNOSIS — Y92410 Unspecified street and highway as the place of occurrence of the external cause: Secondary | ICD-10-CM | POA: Insufficient documentation

## 2016-09-19 DIAGNOSIS — F17210 Nicotine dependence, cigarettes, uncomplicated: Secondary | ICD-10-CM | POA: Insufficient documentation

## 2016-09-19 DIAGNOSIS — Y9301 Activity, walking, marching and hiking: Secondary | ICD-10-CM | POA: Insufficient documentation

## 2016-09-19 DIAGNOSIS — Y999 Unspecified external cause status: Secondary | ICD-10-CM | POA: Insufficient documentation

## 2016-09-19 LAB — CBC
HEMATOCRIT: 42.6 % (ref 39.0–52.0)
HEMOGLOBIN: 15.3 g/dL (ref 13.0–17.0)
MCH: 34.1 pg — ABNORMAL HIGH (ref 26.0–34.0)
MCHC: 35.9 g/dL (ref 30.0–36.0)
MCV: 94.9 fL (ref 78.0–100.0)
Platelets: 262 10*3/uL (ref 150–400)
RBC: 4.49 MIL/uL (ref 4.22–5.81)
RDW: 13.5 % (ref 11.5–15.5)
WBC: 12.6 10*3/uL — AB (ref 4.0–10.5)

## 2016-09-19 LAB — BASIC METABOLIC PANEL
ANION GAP: 13 (ref 5–15)
BUN: 10 mg/dL (ref 6–20)
CHLORIDE: 108 mmol/L (ref 101–111)
CO2: 21 mmol/L — ABNORMAL LOW (ref 22–32)
Calcium: 9 mg/dL (ref 8.9–10.3)
Creatinine, Ser: 0.79 mg/dL (ref 0.61–1.24)
Glucose, Bld: 98 mg/dL (ref 65–99)
POTASSIUM: 4 mmol/L (ref 3.5–5.1)
SODIUM: 142 mmol/L (ref 135–145)

## 2016-09-19 LAB — I-STAT TROPONIN, ED: Troponin i, poc: 0 ng/mL (ref 0.00–0.08)

## 2016-09-19 MED ORDER — IBUPROFEN 600 MG PO TABS: 600.0000 mg | ORAL_TABLET | Freq: Four times a day (QID) | ORAL | 0 refills | Status: DC | PRN

## 2016-09-19 MED ORDER — TETANUS-DIPHTH-ACELL PERTUSSIS 5-2.5-18.5 LF-MCG/0.5 IM SUSP
0.5000 mL | Freq: Once | INTRAMUSCULAR | Status: AC
  Administered 2016-09-19: 0.5 mL via INTRAMUSCULAR
  Filled 2016-09-19: qty 0.5

## 2016-09-19 MED ORDER — OMEPRAZOLE 20 MG PO CPDR: 20.0000 mg | DELAYED_RELEASE_CAPSULE | Freq: Every day | ORAL | 0 refills | Status: DC

## 2016-09-19 MED ORDER — KETOROLAC TROMETHAMINE 60 MG/2ML IM SOLN
60.0000 mg | Freq: Once | INTRAMUSCULAR | Status: AC
  Administered 2016-09-19: 60 mg via INTRAMUSCULAR
  Filled 2016-09-19: qty 2

## 2016-09-19 MED ORDER — ACETAMINOPHEN 500 MG PO TABS
1000.0000 mg | ORAL_TABLET | Freq: Once | ORAL | Status: AC
  Administered 2016-09-19: 1000 mg via ORAL
  Filled 2016-09-19: qty 2

## 2016-09-19 MED ORDER — ACETAMINOPHEN 500 MG PO TABS: 1000.0000 mg | ORAL_TABLET | Freq: Four times a day (QID) | ORAL | 0 refills | Status: DC | PRN

## 2016-09-19 MED ORDER — LORAZEPAM 1 MG PO TABS
1.0000 mg | ORAL_TABLET | Freq: Once | ORAL | Status: AC
  Administered 2016-09-19: 1 mg via ORAL
  Filled 2016-09-19: qty 1

## 2016-09-19 MED ORDER — BACITRACIN ZINC 500 UNIT/GM EX OINT
TOPICAL_OINTMENT | Freq: Once | CUTANEOUS | Status: AC
  Administered 2016-09-19: 1 via TOPICAL
  Filled 2016-09-19: qty 0.9
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
## 2016-09-19 NOTE — ED Notes (Signed)
Patient offered ice pack and declined at this time
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
## 2016-09-19 NOTE — ED Notes (Signed)
ED Provider at bedside. 
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
## 2016-10-24 ENCOUNTER — Emergency Department (HOSPITAL_COMMUNITY)
Admission: EM | Admit: 2016-10-24 | Discharge: 2016-10-24 | Discharge status: Against Medical Advice | Payer: Self-pay | Attending: Emergency Medicine | Admitting: Emergency Medicine

## 2016-10-24 DIAGNOSIS — Z5321 Procedure and treatment not carried out due to patient leaving prior to being seen by health care provider: Secondary | ICD-10-CM | POA: Insufficient documentation

## 2016-10-24 LAB — CBC
HEMATOCRIT: 42.2 % (ref 39.0–52.0)
Hemoglobin: 15.2 g/dL (ref 13.0–17.0)
MCH: 34.5 pg — AB (ref 26.0–34.0)
MCHC: 36 g/dL (ref 30.0–36.0)
MCV: 95.7 fL (ref 78.0–100.0)
Platelets: 246 10*3/uL (ref 150–400)
RBC: 4.41 MIL/uL (ref 4.22–5.81)
RDW: 13.6 % (ref 11.5–15.5)
WBC: 7.2 10*3/uL (ref 4.0–10.5)

## 2016-10-24 LAB — COMPREHENSIVE METABOLIC PANEL
ALBUMIN: 4.8 g/dL (ref 3.5–5.0)
ALT: 72 U/L — AB (ref 17–63)
AST: 81 U/L — AB (ref 15–41)
Alkaline Phosphatase: 69 U/L (ref 38–126)
Anion gap: 13 (ref 5–15)
BUN: 7 mg/dL (ref 6–20)
CHLORIDE: 104 mmol/L (ref 101–111)
CO2: 24 mmol/L (ref 22–32)
Calcium: 9.2 mg/dL (ref 8.9–10.3)
Creatinine, Ser: 0.84 mg/dL (ref 0.61–1.24)
GFR calc Af Amer: >60 mL/min (ref >60)
GFR calc non Af Amer: >60 mL/min (ref >60)
GLUCOSE: 86 mg/dL (ref 65–99)
POTASSIUM: 4 mmol/L (ref 3.5–5.1)
Sodium: 141 mmol/L (ref 135–145)
Total Bilirubin: 0.4 mg/dL (ref 0.3–1.2)
Total Protein: 8.4 g/dL — ABNORMAL HIGH (ref 6.5–8.1)

## 2016-10-24 LAB — LIPASE, BLOOD: LIPASE: 38 U/L (ref 11–51)

## 2016-10-24 NOTE — ED Notes (Signed)
Unable to collect labs called patient name  In the lobby and no one responded

## 2016-10-24 NOTE — ED Triage Notes (Addendum)
Per EMS: Pt from home.  Reports nausea every morning x 1 month.  Pt states that he sticks his finger down his throat every morning and throws up clear mucus.  Pt actively sticking his finger down his throat.  Pt also reports streaks of blood in clear emesis.  Pt also is a heavy drinker and states he drinks as much as he can every day.  Pt states, "It's so bad I can't even hold down my beer!"

## 2016-10-24 NOTE — ED Notes (Signed)
Pt called for v/s recheck, no response from lobby or outside

## 2016-10-24 NOTE — ED Notes (Signed)
Pt called for v/s, no response from lobby 

## 2017-07-31 ENCOUNTER — Ambulatory Visit: Payer: Self-pay | Admitting: Licensed Clinical Social Worker

## 2017-07-31 ENCOUNTER — Ambulatory Visit: Payer: Self-pay | Attending: Internal Medicine | Admitting: Internal Medicine

## 2017-07-31 ENCOUNTER — Encounter: Payer: Self-pay | Admitting: Internal Medicine

## 2017-07-31 VITALS — BP 113/80 | HR 96 | Temp 98.3°F | Resp 16 | Ht 73.0 in | Wt 179.0 lb

## 2017-07-31 DIAGNOSIS — G4700 Insomnia, unspecified: Secondary | ICD-10-CM | POA: Insufficient documentation

## 2017-07-31 DIAGNOSIS — F1020 Alcohol dependence, uncomplicated: Secondary | ICD-10-CM | POA: Insufficient documentation

## 2017-07-31 DIAGNOSIS — Z8679 Personal history of other diseases of the circulatory system: Secondary | ICD-10-CM

## 2017-07-31 DIAGNOSIS — F1990 Other psychoactive substance use, unspecified, uncomplicated: Secondary | ICD-10-CM

## 2017-07-31 DIAGNOSIS — F17200 Nicotine dependence, unspecified, uncomplicated: Secondary | ICD-10-CM

## 2017-07-31 DIAGNOSIS — F17210 Nicotine dependence, cigarettes, uncomplicated: Secondary | ICD-10-CM | POA: Insufficient documentation

## 2017-07-31 DIAGNOSIS — R6889 Other general symptoms and signs: Secondary | ICD-10-CM

## 2017-07-31 DIAGNOSIS — I10 Essential (primary) hypertension: Secondary | ICD-10-CM | POA: Insufficient documentation

## 2017-07-31 DIAGNOSIS — Z114 Encounter for screening for human immunodeficiency virus [HIV]: Secondary | ICD-10-CM

## 2017-07-31 DIAGNOSIS — F10982 Alcohol use, unspecified with alcohol-induced sleep disorder: Secondary | ICD-10-CM | POA: Insufficient documentation

## 2017-07-31 DIAGNOSIS — Z79899 Other long term (current) drug therapy: Secondary | ICD-10-CM | POA: Insufficient documentation

## 2017-07-31 MED ORDER — TRAZODONE HCL 50 MG PO TABS: 25.0000 mg | ORAL_TABLET | Freq: Every evening | ORAL | 3 refills | Status: DC | PRN

## 2017-07-31 MED FILL — traZODone HCL 50 MG TABS: 50 | 30 days supply | Qty: 30 | Fill #0

## 2017-07-31 NOTE — BH Specialist Note (Signed)
Integrated Behavioral Health Initial Visit  MRN: [id number] Name: Thomas Atkins  Number of Integrated Behavioral Health Clinician visits:: 1/6 Session Start time: 4:00 PM  Session End time: 4:15 PM Total time: 15 minutes  Type of Service: Integrated Behavioral Health- Individual/Family Interpretor:No. Interpretor Name and Language: N/A   Warm Hand Off Completed.       SUBJECTIVE: Thomas Atkins is a 42 y.o. male accompanied by self Patient was referred by Dr. Laural Benes for depression, anxiety, and substance use. Patient reports the following symptoms/concerns: feelings of sadness and worry, difficulty sleeping, low energy, restlessness, and irritability Duration of problem: Ongoing; Severity of problem: moderate  OBJECTIVE: Mood: Anxious and Affect: Appropriate Risk of harm to self or others: No plan to harm self or others  LIFE CONTEXT: Family and Social: Pt resides with father. He receives strong family support School/Work: Pt is unemployed. He doesn't receive any public benefits Self-Care: Pt uses substances (alcohol) daily to assist with sleeping. Has hx of receiving behavioral health services; however, is not receiving services at this time Life Changes: Pt reports difficulty managing medical conditions and uses substances to cope with stressors  GOALS ADDRESSED: Patient will: 1. Reduce symptoms of: anxiety and depression 2. Increase knowledge and/or ability of: coping skills and healthy habits  3. Demonstrate ability to: Decrease self-medicating behaviors  INTERVENTIONS: Interventions utilized: Motivational Interviewing, Supportive Counseling, Psychoeducation and/or Health Education and Link to [facility]  Standardized Assessments completed: GAD-7 and PHQ 2&9  ASSESSMENT: Patient currently experiencing depression and anxiety triggered by ongoing medical conditions and substance use. He reports feelings of sadness and worry, difficulty sleeping, low energy,  restlessness, and irritability. He receives strong support from family who resides locally. Denies SI/HI/AVH.   Patient may benefit from psychoeducation, psychotherapy, and medication management. LCSWA educated pt on correlation between one's physical and mental health, in addition, to how substance use can negatively impact both. Pt reports decrease in substance use. He is not interested in substance use treatment at this time. Reports major concern is managing blood pressure and getting more sleep. LCSWA discussed healthy strategies to assist with sleep hygiene and provided supportive resources for crisis intervention. Pt plans to schedule appointment with financial counseling to assist with finances.  PLAN: 1. Follow up with behavioral health clinician on : Pt was encouraged to contact LCSWA if symptoms worsen or fail to improve to schedule behavioral appointments at [facility]. 2. Behavioral recommendations: LCSWA recommends that pt apply healthy coping skills discussed, schedule appointment with financial counseling, and utilize provided resources. Pt is encouraged to schedule follow up appointment with LCSWA 3. Referral(s): Substance Abuse Program and Community Resources:  Finances 4. "From scale of 1-10, how likely are you to follow plan?":   Bridgett Larsson, LCSW 07/31/17 5:19 PM

## 2017-07-31 NOTE — Progress Notes (Signed)
Patient ID: Thomas Atkins, male    DOB: [birth year]  MRN: [id number]  CC: New Patient (Initial Visit) and Insomnia   Subjective: Thomas Atkins is a 42 y.o. male who presents for new patient visit. His concerns today include:  Patient with history of alcohol use disorder, tob dep, anx/dep, ? seizure disorder  Presents today for 2 reasons.  He is concerned about his blood pressure and he is requesting a medication to help him sleep better.  Gives history of hypertension and was on blood pressure medication up until about 6 to 8 months ago when he stopped taking it.  He does not recall the name.  He feels his blood pressure is high because he is sweating all the time.  Denies any dizziness.  Sometimes he has headaches.  No chest pains or shortness of breath.  He does not own a blood pressure device but has access to one through his mother.  He reports that he does not sleep for more than 4 hours at night and he feels very drained because of it.  He drinks two 40 ounce beers at night to help him sleep.  He goes to bed around 11:30.  He falls asleep easily but is awake after 4 hours and not able to fall back to sleep.  He usually sets his phone to music for the first 15 minutes when he is falling asleep.  Otherwise he sleeps with all lights and sounds off.  Does not use his cell phone while in bed.  He was on trazodone in the past which worked well for him but has not been able to afford it  Smoke 1 pk/day since age 83.  Never tried to quit.  Not ready to quit. No street drugs. Drinks two 40 oz beers every night.  Use to drink much more.  Went through several detox programs and was being followed at [facility] and another place that he refers to his MHI.  However he has not currently receiving any mental health care. "I dont know why I continue to drink." He is currently unemployed.  He is to Pharmacologist.  States that he gave up working due to decreased energy   Patient Active Problem List   Diagnosis Date Noted  . Major depressive disorder, recurrent severe without psychotic features (HCC) 10/10/2015  . Alcohol abuse with alcohol-induced mood disorder (HCC) 05/31/2015  . Alcohol use disorder, severe, dependence (HCC) 05/30/2015     Current Outpatient Medications on File Prior to Visit  Medication Sig Dispense Refill  . famotidine (PEPCID AC) 10 MG chewable tablet Chew 10 mg by mouth 2 (two) times daily as needed for heartburn.    Marland Kitchen omeprazole (PRILOSEC) 20 MG capsule Take 1 capsule (20 mg total) by mouth daily. While taking ibuprofen, to prevent ulcers 10 capsule 0   No current facility-administered medications on file prior to visit.     Allergies  Allergen Reactions  . Bee Venom Anaphylaxis  . Lactose Intolerance (Gi) Diarrhea, Nausea Only and Other (See Comments)    Abdominal pain    . Ultram [Tramadol Hcl] Hives         Social History   Socioeconomic History  . Marital status: Divorced    Spouse name: Not on file  . Number of children: 2  . Years of education: Not on file  . Highest education level: Not on file  Occupational History  . Occupation: Unemployed  Social Needs  . Financial resource strain:  Not on file  . Food insecurity:    Worry: Not on file    Inability: Not on file  . Transportation needs:    Medical: Not on file    Non-medical: Not on file  Tobacco Use  . Smoking status: Current Every Day Smoker    Packs/day: 1.00    Years: 30.00    Pack years: 30.00    Types: Cigarettes  . Smokeless tobacco: Never Used  . Tobacco comment: Patient refused cessation materials  Substance and Sexual Activity  . Alcohol use: Yes    Alcohol/week: 14.4 oz    Types: 24 Cans of beer per week    Comment: 320 oz daily  . Drug use: No    Comment: Denies drug use  . Sexual activity: Not on file  Lifestyle  . Physical activity:    Days per week: Not on file    Minutes per session: Not on file  . Stress: Not on file  Relationships  . Social  connections:    Talks on phone: Not on file    Gets together: Not on file    Attends religious service: Not on file    Active member of club or organization: Not on file    Attends meetings of clubs or organizations: Not on file    Relationship status: Not on file  . Intimate partner violence:    Fear of current or ex partner: Not on file    Emotionally abused: Not on file    Physically abused: Not on file    Forced sexual activity: Not on file  Other Topics Concern  . Not on file  Social History Narrative  . Not on file    Family History  Problem Relation Age of Onset  . Heart disease Paternal Grandmother   . Bipolar disorder Father     Past Surgical History:  Procedure Laterality Date  . SURGERY SCROTAL / TESTICULAR      ROS: Review of Systems Negative except as stated above PHYSICAL EXAM: BP 134/89   Pulse 96   Temp 98.3 F (36.8 C) (Oral)   Resp 16   Ht  (1.854 m)   Wt 179 lb (81.2 kg)   SpO2 95%   BMI 23.62 kg/m   Wt Readings from Last 3 Encounters:  07/31/17 179 lb (81.2 kg)  12/19/15 165 lb (74.8 kg)  07/25/15 165 lb (74.8 kg)   Repeat blood pressure BP 113/80 RT, 113/81 LT side 113/81 Physical Exam   General appearance -middle-age Caucasian male in NAD  mental status -he is alert and oriented.  He answers questions appropriately.  Normal affect Mouth - mucous membranes moist, pharynx normal without lesions Neck - supple, no significant adenopathy Chest - clear to auscultation, no wheezes, rales or rhonchi, symmetric air entry Heart - normal rate, regular rhythm, normal S1, S2, no murmurs, rubs, clicks or gallops Extremities -no lower extremity edema  ASSESSMENT AND PLAN: 1. History of hypertension Blood pressure today is acceptable Advised limiting salt in the foods. Advised to check blood pressure at least once a day and record readings.  Bring in on next visit in 2 weeks.  2. Alcohol use disorder, moderate, dependence (HCC) Commended  him on cutting back.  Encourage him to cut back more.  Discussed health risks associated with continued EtOH abuse.  Liver enzymes last year were elevated. LCSW to meet with patient today to discuss programs in the area treatment if he is interested - CBC -  Comprehensive metabolic panel  3. Tobacco dependence Patient advised to quit smoking. Discussed health risks associated with smoking including lung and other types of cancers, chronic lung diseases and CV risks.. Pt not ready to give trail of quitting.    4. Alcohol-induced insomnia (HCC) Good sleep hygiene discussed.  Commended him on cutting back on the amount that he drinks but encouraged him to cut back even more as alcohol does disturb sleep.  Restart trazodone but at a lower dose - traZODone (DESYREL) 50 MG tablet; Take 0.5-1 tablets (25-50 mg total) by mouth at bedtime as needed for sleep.  Dispense: 30 tablet; Refill: 3  5. Screening for HIV (human immunodeficiency virus) - HIV antibody (with reflex)  6. Heat intolerance - TSH   Patient was given the opportunity to ask questions.  Patient verbalized understanding of the plan and was able to repeat key elements of the plan.   Orders Placed This Encounter  Procedures  . HIV antibody (with reflex)  . CBC  . Comprehensive metabolic panel  . TSH     Requested Prescriptions   Signed Prescriptions Disp Refills  . traZODone (DESYREL) 50 MG tablet 30 tablet 3    Sig: Take 0.5-1 tablets (25-50 mg total) by mouth at bedtime as needed for sleep.    Return in about 2 weeks (around 08/14/2017).  [name], MD, FACP

## 2017-08-01 LAB — CBC
Hematocrit: 45.2 % (ref 37.5–51.0)
Hemoglobin: 14.9 g/dL (ref 13.0–17.7)
MCH: 33.3 pg — ABNORMAL HIGH (ref 26.6–33.0)
MCHC: 33 g/dL (ref 31.5–35.7)
MCV: 101 fL — AB (ref 79–97)
PLATELETS: 254 10*3/uL (ref 150–450)
RBC: 4.48 x10E6/uL (ref 4.14–5.80)
RDW: 14.1 % (ref 12.3–15.4)
WBC: 11.2 10*3/uL — AB (ref 3.4–10.8)

## 2017-08-01 LAB — COMPREHENSIVE METABOLIC PANEL
A/G RATIO: 1.6 (ref 1.2–2.2)
ALT: 67 IU/L — AB (ref 0–44)
AST: 43 IU/L — AB (ref 0–40)
Albumin: 4.8 g/dL (ref 3.5–5.5)
Alkaline Phosphatase: 69 IU/L (ref 39–117)
BUN/Creatinine Ratio: 9 (ref 9–20)
BUN: 8 mg/dL (ref 6–24)
Bilirubin Total: 0.6 mg/dL (ref 0.0–1.2)
CALCIUM: 10 mg/dL (ref 8.7–10.2)
CHLORIDE: 105 mmol/L (ref 96–106)
CO2: 20 mmol/L (ref 20–29)
Creatinine, Ser: 0.87 mg/dL (ref 0.76–1.27)
GFR calc Af Amer: 121 mL/min/{1.73_m2} (ref >59)
GFR calc non Af Amer: 105 mL/min/{1.73_m2} (ref >59)
GLUCOSE: 87 mg/dL (ref 65–99)
Globulin, Total: 3 g/dL (ref 1.5–4.5)
POTASSIUM: 4.9 mmol/L (ref 3.5–5.2)
Sodium: 142 mmol/L (ref 134–144)
Total Protein: 7.8 g/dL (ref 6.0–8.5)

## 2017-08-01 LAB — HIV ANTIBODY (ROUTINE TESTING W REFLEX): HIV SCREEN 4TH GENERATION: NONREACTIVE

## 2017-08-01 LAB — TSH: TSH: 1.3 u[IU]/mL (ref 0.450–4.500)

## 2017-08-05 ENCOUNTER — Telehealth: Payer: Self-pay

## 2017-08-05 NOTE — Telephone Encounter (Signed)
Contacted pt to go over lab results pt is aware and doesn't have any questions or concerns 

## 2017-09-03 ENCOUNTER — Encounter: Payer: Self-pay | Admitting: Internal Medicine

## 2017-09-03 ENCOUNTER — Ambulatory Visit: Payer: Federal, State, Local not specified - Other | Attending: Internal Medicine | Admitting: Internal Medicine

## 2017-09-03 VITALS — BP 141/98 | HR 100 | Temp 98.7°F | Resp 16 | Wt 171.2 lb

## 2017-09-03 DIAGNOSIS — F10982 Alcohol use, unspecified with alcohol-induced sleep disorder: Secondary | ICD-10-CM

## 2017-09-03 DIAGNOSIS — I10 Essential (primary) hypertension: Secondary | ICD-10-CM | POA: Insufficient documentation

## 2017-09-03 DIAGNOSIS — Z79899 Other long term (current) drug therapy: Secondary | ICD-10-CM | POA: Insufficient documentation

## 2017-09-03 DIAGNOSIS — F17210 Nicotine dependence, cigarettes, uncomplicated: Secondary | ICD-10-CM | POA: Diagnosis not present

## 2017-09-03 DIAGNOSIS — F1020 Alcohol dependence, uncomplicated: Secondary | ICD-10-CM

## 2017-09-03 DIAGNOSIS — F10282 Alcohol dependence with alcohol-induced sleep disorder: Secondary | ICD-10-CM | POA: Insufficient documentation

## 2017-09-03 MED ORDER — TRAZODONE HCL 100 MG PO TABS: 100.0000 mg | ORAL_TABLET | Freq: Every evening | ORAL | 3 refills | Status: DC | PRN

## 2017-09-03 MED ORDER — AMLODIPINE BESYLATE 5 MG PO TABS: 7.5000 mg | ORAL_TABLET | Freq: Every day | ORAL | 3 refills | Status: DC

## 2017-09-03 MED FILL — traZODone HCL 100 MG TABS: 100 | 30 days supply | Qty: 30 | Fill #0

## 2017-09-03 MED FILL — AMLODIPINE BESYLATE 5 MG TA: 5 | 30 days supply | Qty: 15 | Fill #0

## 2017-09-03 NOTE — Progress Notes (Signed)
Pt is wanting to speak with the provider about doubling up on the trazodone

## 2017-09-03 NOTE — Patient Instructions (Signed)
Star Amlodipine for your blood pressure. Check your blood pressure once a week.  Goal is 130/80 or lower.   Continue to work on cutting back on drinking alcohol.  Consider going to detox program.

## 2017-09-03 NOTE — Progress Notes (Signed)
Patient ID: Thomas Atkins, male    DOB: [name][birth year]  MRN: [id number]  CC: Follow-up   Subjective: Thomas MawMark Landgren is a 42 y.o. male who presents for 1 mth f/u His concerns today include:  Patient with history of alcohol use disorder, tob dep, anx/dep, ? seizure disorder  HTN:  Reports he was checking BP TID but forgot to bring chart Gives range 160-180/80-90s  Insomnia: Sleeping better with trazodone but would like increased dose.  He has been taking 50 mg at nights and this allows him to sleep longer and a little better.  He would like to increase the dose to 100 mg.  ETOH abuse:   "I've slowed down on my drinking a lot."  Drinks two 40 oz beers a day. Has to drink one in the mornings to take away the "shakes.  Then I get out to look for jobs."  He admits that it seems counterproductive that he has to drink before he walks out the door in the mornings and search of employment.  He knows that he has to stop drinking for his own good.  He feels that he needs to go to detox but has not made up his mind to take that next step.  He did receive the results of blood work we did on last visit.  His liver enzymes were elevated   Patient Active Problem List   Diagnosis Date Noted  . History of hypertension 07/31/2017  . Alcohol use disorder, moderate, dependence (HCC) 07/31/2017  . Tobacco dependence 07/31/2017  . Alcohol-induced insomnia (HCC) 07/31/2017  . Major depressive disorder, recurrent severe without psychotic features (HCC) 10/10/2015  . Alcohol abuse with alcohol-induced mood disorder (HCC) 05/31/2015     Current Outpatient Medications on File Prior to Visit  Medication Sig Dispense Refill  . famotidine (PEPCID AC) 10 MG chewable tablet Chew 10 mg by mouth 2 (two) times daily as needed for heartburn.    Marland Kitchen. omeprazole (PRILOSEC) 20 MG capsule Take 1 capsule (20 mg total) by mouth daily. While taking ibuprofen, to prevent ulcers 10 capsule 0   No current facility-administered medications  on file prior to visit.     Allergies  Allergen Reactions  . Bee Venom Anaphylaxis  . Lactose Intolerance (Gi) Diarrhea, Nausea Only and Other (See Comments)    Abdominal pain    . Ultram [Tramadol Hcl] Hives         Social History   Socioeconomic History  . Marital status: Divorced    Spouse name: Not on file  . Number of children: 2  . Years of education: Not on file  . Highest education level: Not on file  Occupational History  . Occupation: Unemployed  Social Needs  . Financial resource strain: Not on file  . Food insecurity:    Worry: Not on file    Inability: Not on file  . Transportation needs:    Medical: Not on file    Non-medical: Not on file  Tobacco Use  . Smoking status: Current Every Day Smoker    Packs/day: 1.00    Years: 30.00    Pack years: 30.00    Types: Cigarettes  . Smokeless tobacco: Never Used  . Tobacco comment: Patient refused cessation materials  Substance and Sexual Activity  . Alcohol use: Yes    Alcohol/week: 14.4 oz    Types: 24 Cans of beer per week    Comment: 320 oz daily  . Drug use: No    Comment:  Denies drug use  . Sexual activity: Not on file  Lifestyle  . Physical activity:    Days per week: Not on file    Minutes per session: Not on file  . Stress: Not on file  Relationships  . Social connections:    Talks on phone: Not on file    Gets together: Not on file    Attends religious service: Not on file    Active member of club or organization: Not on file    Attends meetings of clubs or organizations: Not on file    Relationship status: Not on file  . Intimate partner violence:    Fear of current or ex partner: Not on file    Emotionally abused: Not on file    Physically abused: Not on file    Forced sexual activity: Not on file  Other Topics Concern  . Not on file  Social History Narrative  . Not on file    Family History  Problem Relation Age of Onset  . Heart disease Paternal Grandmother   . Bipolar  disorder Father     Past Surgical History:  Procedure Laterality Date  . SURGERY SCROTAL / TESTICULAR      ROS: Review of Systems Negative except as stated above PHYSICAL EXAM: BP (!) 141/98   Pulse 100   Temp 98.7 F (37.1 C) (Oral)   Resp 16   Wt 171 lb 3.2 oz (77.7 kg)   SpO2 94%   BMI 22.59 kg/m   Physical Exam  General appearance -patient in NAD. Mental status -he answers questions appropriately. Chest - clear to auscultation, no wheezes, rales or rhonchi, symmetric air entry Heart - normal rate, regular rhythm, normal S1, S2, no murmurs, rubs, clicks or gallops  Results for orders placed or performed in visit on 07/31/17  HIV antibody (with reflex)  Result Value Ref Range   HIV Screen 4th Generation wRfx Non Reactive Non Reactive  CBC  Result Value Ref Range   WBC 11.2 (H) 3.4 - 10.8 x10E3/uL   RBC 4.48 4.14 - 5.80 x10E6/uL   Hemoglobin 14.9 13.0 - 17.7 g/dL   Hematocrit 34.1 96.2 - 51.0 %   MCV 101 (H) 79 - 97 fL   MCH 33.3 (H) 26.6 - 33.0 pg   MCHC 33.0 31.5 - 35.7 g/dL   RDW 22.9 79.8 - 92.1 %   Platelets 254 150 - 450 x10E3/uL  Comprehensive metabolic panel  Result Value Ref Range   Glucose 87 65 - 99 mg/dL   BUN 8 6 - 24 mg/dL   Creatinine, Ser 1.94 0.76 - 1.27 mg/dL   GFR calc non Af Amer 105 >59 mL/min/1.73   GFR calc Af Amer 121 >59 mL/min/1.73   BUN/Creatinine Ratio 9 9 - 20   Sodium 142 134 - 144 mmol/L   Potassium 4.9 3.5 - 5.2 mmol/L   Chloride 105 96 - 106 mmol/L   CO2 20 20 - 29 mmol/L   Calcium 10.0 8.7 - 10.2 mg/dL   Total Protein 7.8 6.0 - 8.5 g/dL   Albumin 4.8 3.5 - 5.5 g/dL   Globulin, Total 3.0 1.5 - 4.5 g/dL   Albumin/Globulin Ratio 1.6 1.2 - 2.2   Bilirubin Total 0.6 0.0 - 1.2 mg/dL   Alkaline Phosphatase 69 39 - 117 IU/L   AST 43 (H) 0 - 40 IU/L   ALT 67 (H) 0 - 44 IU/L  TSH  Result Value Ref Range   TSH 1.300 0.450 -  4.500 uIU/mL    ASSESSMENT AND PLAN: 1. Alcohol-induced insomnia (HCC) Again discussed the  importance of cutting back more on alcohol.  I think his best option would be to go through detox and then to a long-term treatment program.  Patient not ready to make the next step stating that he has a few things that need to get done including going to a wedding - traZODone (DESYREL) 100 MG tablet; Take 1 tablet (100 mg total) by mouth at bedtime as needed for sleep.  Dispense: 30 tablet; Refill: 3  2. Alcohol use disorder, severe, dependence (HCC) See #1 above  3. Essential hypertension Start amlodipine.  Alcohol use probably playing a role here - amLODipine (NORVASC) 5 MG tablet; Take 1.5 tablets (7.5 mg total) by mouth daily.  Dispense: 45 tablet; Refill: 3   Patient was given the opportunity to ask questions.  Patient verbalized understanding of the plan and was able to repeat key elements of the plan.   No orders of the defined types were placed in this encounter.    Requested Prescriptions   Signed Prescriptions Disp Refills  . amLODipine (NORVASC) 5 MG tablet 45 tablet 3    Sig: Take 1.5 tablets (7.5 mg total) by mouth daily.  . traZODone (DESYREL) 100 MG tablet 30 tablet 3    Sig: Take 1 tablet (100 mg total) by mouth at bedtime as needed for sleep.    Return in about 2 months (around 11/03/2017).  Thomas Blue, MD, FACP

## 2017-10-21 MED FILL — AMLODIPINE BESYLATE 5 MG TA: 5 | 30 days supply | Qty: 15 | Fill #1

## 2017-10-21 MED FILL — traZODone HCL 100 MG TABS: 100 | 30 days supply | Qty: 30 | Fill #1

## 2017-11-03 ENCOUNTER — Ambulatory Visit: Payer: Self-pay | Admitting: Internal Medicine

## 2017-12-09 MED FILL — AMLODIPINE BESYLATE 5 MG TA: 5 | 30 days supply | Qty: 15 | Fill #2 | Status: TO

## 2017-12-09 MED FILL — traZODone HCL 100 MG TABS: 100 | 30 days supply | Qty: 30 | Fill #2 | Status: TO

## 2018-05-25 ENCOUNTER — Other Ambulatory Visit: Payer: Self-pay | Admitting: Internal Medicine

## 2018-05-25 DIAGNOSIS — F10982 Alcohol use, unspecified with alcohol-induced sleep disorder: Secondary | ICD-10-CM

## 2018-09-01 ENCOUNTER — Other Ambulatory Visit: Payer: Self-pay

## 2018-09-01 ENCOUNTER — Encounter: Payer: Self-pay | Admitting: Internal Medicine

## 2018-09-01 ENCOUNTER — Ambulatory Visit: Payer: Self-pay | Attending: Internal Medicine | Admitting: Internal Medicine

## 2018-09-01 VITALS — BP 131/93 | HR 86 | Temp 98.2°F | Resp 18 | Ht 73.0 in | Wt 194.0 lb

## 2018-09-01 DIAGNOSIS — F17200 Nicotine dependence, unspecified, uncomplicated: Secondary | ICD-10-CM

## 2018-09-01 DIAGNOSIS — R945 Abnormal results of liver function studies: Secondary | ICD-10-CM

## 2018-09-01 DIAGNOSIS — R7989 Other specified abnormal findings of blood chemistry: Secondary | ICD-10-CM

## 2018-09-01 DIAGNOSIS — L309 Dermatitis, unspecified: Secondary | ICD-10-CM

## 2018-09-01 DIAGNOSIS — I10 Essential (primary) hypertension: Secondary | ICD-10-CM

## 2018-09-01 DIAGNOSIS — F10982 Alcohol use, unspecified with alcohol-induced sleep disorder: Secondary | ICD-10-CM

## 2018-09-01 DIAGNOSIS — F1010 Alcohol abuse, uncomplicated: Secondary | ICD-10-CM

## 2018-09-01 MED ORDER — TRAZODONE HCL 100 MG PO TABS: 100.0000 mg | ORAL_TABLET | Freq: Every evening | ORAL | 3 refills | Status: DC | PRN

## 2018-09-01 MED ORDER — DOXYCYCLINE HYCLATE 100 MG PO TABS: 100.0000 mg | ORAL_TABLET | Freq: Two times a day (BID) | ORAL | 0 refills | Status: DC

## 2018-09-01 MED ORDER — PERMETHRIN 5 % EX CREA: TOPICAL_CREAM | CUTANEOUS | 0 refills | Status: DC

## 2018-09-01 MED ORDER — TRIAMCINOLONE ACETONIDE 0.1 % EX CREA: 1.0000 "application " | TOPICAL_CREAM | Freq: Two times a day (BID) | CUTANEOUS | 0 refills | Status: DC

## 2018-09-01 MED ORDER — AMLODIPINE BESYLATE 5 MG PO TABS: 7.5000 mg | ORAL_TABLET | Freq: Every day | ORAL | 3 refills | Status: DC

## 2018-09-01 MED FILL — AMLODIPINE BESYLATE 5 MG TA: 5 | 30 days supply | Qty: 45 | Fill #0

## 2018-09-01 MED FILL — traZODone HCL 100 MG TABS: 100 | 30 days supply | Qty: 30 | Fill #0

## 2018-09-01 MED FILL — PERMETHRIN 5% CREAM: 5 | 7 days supply | Qty: 60 | Fill #0

## 2018-09-01 MED FILL — DOXYCYCLINE HYCLATE 100 MG: 100 | 7 days supply | Qty: 14 | Fill #0

## 2018-09-01 MED FILL — TRIAMCINOLONE ACETONIDE 0.1: 0.1 | 15 days supply | Qty: 30 | Fill #0

## 2018-09-01 NOTE — Progress Notes (Addendum)
Patient ID: Jonah BlueMark P Deleeuw, male    DOB: 1975-07-29  MRN: [id number]  CC: Medication Refill   Subjective: Thomas Atkins is a 43 y.o. male who presents for chronic ds management.  Last seen 1 yr ago His concerns today include:  Patient with history of HTN, alcohol use disorder,tob dep, anx/dep, ?seizure disorder  Gained 23 lbs in last yr. Attributes this to over eating during COVID pandemic  Rash: "Bugs eating the hell out of me."  Patient complains of having an itchy rash over the trunk, in the axilla and groin area x3 days.  He mowed a friend's lawn 4 days ago.  The friend [place] and has 2 dogs.  He stayed at his friend's house that night after mowing his lawn.  He woke up the next morning with red itchy painful spots over the chest, arms, axilla and in the groin area.  He had removed a small tick from off of his pants.  He is not sure whether the dogs have fleas.  HTN:  Out of Norvasc med for months.  Tried to call for RFs but just could not seem to connect No CP/SOB/LE edema    Insomnia:  Was trying to get RF on Trazodone but told he needed to be seen.   Problems sleeping with out it.  Tries to get in bed around the same time every night  ETOH: Patient states that he has cut back significantly.  "Some nights I drink a bunch and some nights not at all."  Drinks  12 pk/wk now.  Does not drink in morning or during the day anymore.   Tob: 1 pk a day.  Not ready to quit.    Positive depression screen today but patient states he does not consider himself being depressed.  Feels down at times due to problems finding job.  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  Has 2 daughters and has a grand-baby.  Denies any homicidal or suicidal ideation.  Patient Active Problem List   Diagnosis Date  Noted  . History of hypertension 07/31/2017  . Alcohol use disorder, moderate, dependence (HCC) 07/31/2017  . Tobacco dependence 07/31/2017  . Alcohol-induced insomnia (HCC) 07/31/2017  . Major depressive disorder, recurrent severe without psychotic features (HCC) 10/10/2015  . Alcohol abuse with alcohol-induced mood disorder (HCC) 05/31/2015     No current outpatient medications on file prior to visit.   No current facility-administered medications on file prior to visit.     Allergies  Allergen Reactions  . Bee Venom Anaphylaxis  . Lactose Intolerance (Gi) Diarrhea, Nausea Only and Other (See Comments)    Abdominal pain    . Ultram [Tramadol Hcl] Hives         Social History   Socioeconomic History  . Marital status: Divorced    Spouse name: Not on file  . Number of children: 2  . Years of education: Not on file  . Highest education level: Not on file  Occupational History  . Occupation: Unemployed  Social Needs  . Financial resource strain: Not on file  . Food insecurity    Worry: Not on file    Inability: Not on file  . Transportation needs    Medical: Not on  file    Non-medical: Not on file  Tobacco Use  . Smoking status: Current Every Day Smoker    Packs/day: 1.00    Years: 30.00    Pack years: 30.00    Types: Cigarettes  . Smokeless tobacco: Never Used  . Tobacco comment: Patient refused cessation materials  Substance and Sexual Activity  . Alcohol use: Yes    Alcohol/week: 24.0 standard drinks    Types: 24 Cans of beer per week    Comment: 320 oz daily  . Drug use: No    Comment: Denies drug use  . Sexual activity: Not on file  Lifestyle  . Physical activity    Days per week: Not on file    Minutes per session: Not on file  . Stress: Not on file  Relationships  . Social Musicianconnections    Talks on phone: Not on file    Gets together: Not on file    Attends religious service: Not on file    Active member of club or organization: Not on file     Attends meetings of clubs or organizations: Not on file    Relationship status: Not on file  . Intimate partner violence    Fear of current or ex partner: Not on file    Emotionally abused: Not on file    Physically abused: Not on file    Forced sexual activity: Not on file  Other Topics Concern  . Not on file  Social History Narrative  . Not on file    Family History  Problem Relation Age of Onset  . Heart disease Paternal Grandmother   . Bipolar disorder Father     Past Surgical History:  Procedure Laterality Date  . SURGERY SCROTAL / TESTICULAR      ROS: Review of Systems Negative except as stated above  PHYSICAL EXAM: BP (!) 131/93   Pulse 86   Temp 98.2 F (36.8 C) (Oral)   Resp 18   Ht 6\' 1"  (1.854 m)   Wt 194 lb (88 kg)   SpO2 98%   BMI 25.60 kg/m   Wt Readings from Last 3 Encounters:  09/01/18 194 lb (88 kg)  09/03/17 171 lb 3.2 oz (77.7 kg)  07/31/17 179 lb (81.2 kg)   Physical Exam  General appearance - alert, well appearing, and in no distress Mental status - normal mood, behavior, speech, dress, motor activity, and thought processes Neck - supple, no significant adenopathy Chest - clear to auscultation, no wheezes, rales or rhonchi, symmetric air entry Heart - normal rate, regular rhythm, normal S1, S2, no murmurs, rubs, clicks or gallops Extremities - peripheral pulses normal, no pedal edema, no clubbing or cyanosis Skin -noted to small slightly raised erythematous areas over the posterior and anterior trunk, in the axilla, the groin area and some on the arms       Dep[facility] screen [facility] 2/9 09/01/2018 09/03/2017 07/31/2017  Decreased Interest 1 2 2   Down, Depressed, Hopeless 1 2 2   PHQ - 2 Score 2 4 4   Altered sleeping 3 3 3   Tired, decreased energy 1 3 3   Change in appetite 0 3 1  Feeling bad or failure about yourself  1 0 1  Trouble concentrating 0 0 0  Moving slowly or fidgety/restless 0 1 0  Suicidal thoughts 0 0 0  PHQ-9 Score 7 14  12    GAD 7 : Generalized Anxiety Score 09/01/2018 09/03/2017 07/31/2017  Nervous, Anxious, on Edge 2 1 1  Control/stop worrying 1 3 2   Worry too much - different things 1 3 1   Trouble relaxing 0 2 1  Restless 0 2 2  Easily annoyed or irritable 1 3 0  Afraid - awful might happen 1 3 0  Total GAD 7 Score 6 17 7       CMP Latest Ref Rng & Units 07/31/2017 10/24/2016 09/19/2016  Glucose 65 - 99 mg/dL 87 86 98  BUN 6 - 24 mg/dL 8 7 10   Creatinine 0.76 - 1.27 mg/dL 0.87 0.84 0.79  Sodium 134 - 144 mmol/L 142 141 142  Potassium 3.5 - 5.2 mmol/L 4.9 4.0 4.0  Chloride 96 - 106 mmol/L 105 104 108  CO2 20 - 29 mmol/L 20 24 21(L)  Calcium 8.7 - 10.2 mg/dL 10.0 9.2 9.0  Total Protein 6.0 - 8.5 g/dL 7.8 8.4(H) -  Total Bilirubin 0.0 - 1.2 mg/dL 0.6 0.4 -  Alkaline Phos 39 - 117 IU/L 69 69 -  AST 0 - 40 IU/L 43(H) 81(H) -  ALT 0 - 44 IU/L 67(H) 72(H) -   Lipid Panel  No results found for: CHOL, TRIG, HDL, CHOLHDL, VLDL, LDLCALC, LDLDIRECT  CBC    Component Value Date/Time   WBC 11.2 (H) 07/31/2017 1613   WBC 7.2 10/24/2016 1519   RBC 4.48 07/31/2017 1613   RBC 4.41 10/24/2016 1519   HGB 14.9 07/31/2017 1613   HCT 45.2 07/31/2017 1613   PLT 254 07/31/2017 1613   MCV 101 (H) 07/31/2017 1613   MCH 33.3 (H) 07/31/2017 1613   MCH 34.5 (H) 10/24/2016 1519   MCHC 33.0 07/31/2017 1613   MCHC 36.0 10/24/2016 1519   RDW 14.1 07/31/2017 1613   LYMPHSABS 3.2 12/19/2015 0210   MONOABS 0.7 12/19/2015 0210   EOSABS 0.2 12/19/2015 0210   BASOSABS 0.0 12/19/2015 0210    ASSESSMENT AND PLAN: 1. Dermatitis Differential diagnosis includes fleabites versus disseminated erythema migrans versus scabies However given Elimite cream and went over with him how to use it.  Once he is used it and washed it off, he can try using the triamcinolone cream if he is still having some itching. - triamcinolone cream (KENALOG) 0.1 %; Apply 1 application topically 2 (two) times daily.  Dispense: 30 g; Refill: 0 -  permethrin (ELIMITE) 5 % cream; Apply cream from head to soles of feet and wash off after 8 to 14 hours.  Repeat in 7 days.  Dispense: 60 g; Refill: 0 - doxycycline (VIBRA-TABS) 100 MG tablet; Take 1 tablet (100 mg total) by mouth 2 (two) times daily.  Dispense: 14 tablet; Refill: 0  2. Alcohol-induced insomnia ([place]) Commended him on cutting back on the amount that he drinks.  Went over how much is too much to drink in 1 setting.  For men it should not be more than 2 standard drinks per day.  However I have encouraged him to quit drinking completely - traZODone (DESYREL) 100 MG tablet; Take 1 tablet (100 mg total) by mouth at bedtime as needed for sleep.  Dispense: 30 tablet; Refill: 3  3. Essential hypertension Not at goal.  Refill given on amlodipine - amLODipine (NORVASC) 5 MG tablet; Take 1.5 tablets (7.5 mg total) by mouth daily.  Dispense: 45 tablet; Refill: 3 - CBC - Comprehensive metabolic panel  4. Tobacco dependence Advised to quit.  Patient not ready to do so.  Discussed health risks associated with smoking.  Less than 5 minutes spent on counseling.  5. Alcohol use disorder,  mild, abuse See #2 above    Patient was given the opportunity to ask questions.  Patient verbalized understanding of the plan and was able to repeat key elements of the plan.   ADDENDUM 09/02/2018:  AST/ALT elev.  Will ask lab to add hep C ab and Hep B Ag and surface AB to labs draw yesterday. -Spoke with lab tech.  Blood needs to be drawn in different tube. Therefore will enter as future lab and ask pt to return to lab.  Orders Placed This Encounter  Procedures  . CBC  . Comprehensive metabolic panel     Requested Prescriptions   Signed Prescriptions Disp Refills  . traZODone (DESYREL) 100 MG tablet 30 tablet 3    Sig: Take 1 tablet (100 mg total) by mouth at bedtime as needed for sleep.  Marland Kitchen. amLODipine (NORVASC) 5 MG tablet 45 tablet 3    Sig: Take 1.5 tablets (7.5 mg total) by mouth daily.  Marland Kitchen.  triamcinolone cream (KENALOG) 0.1 % 30 g 0    Sig: Apply 1 application topically 2 (two) times daily.  . permethrin (ELIMITE) 5 % cream 60 g 0    Sig: Apply cream from head to soles of feet and wash off after 8 to 14 hours.  Repeat in 7 days.  Marland Kitchen. doxycycline (VIBRA-TABS) 100 MG tablet 14 tablet 0    Sig: Take 1 tablet (100 mg total) by mouth 2 (two) times daily.    Return in about 3 months (around 12/02/2018).  Jonah Blueeborah Carmellia Kreisler, MD, FACP

## 2018-09-02 ENCOUNTER — Telehealth: Payer: Self-pay | Admitting: *Deleted

## 2018-09-02 LAB — COMPREHENSIVE METABOLIC PANEL
ALT: 135 IU/L — ABNORMAL HIGH (ref 0–44)
AST: 112 IU/L — ABNORMAL HIGH (ref 0–40)
Albumin/Globulin Ratio: 1.5 (ref 1.2–2.2)
Albumin: 4.7 g/dL (ref 4.0–5.0)
Alkaline Phosphatase: 73 IU/L (ref 39–117)
BUN/Creatinine Ratio: 9 (ref 9–20)
BUN: 9 mg/dL (ref 6–24)
Bilirubin Total: 0.5 mg/dL (ref 0.0–1.2)
CO2: 20 mmol/L (ref 20–29)
Calcium: 9.9 mg/dL (ref 8.7–10.2)
Chloride: 102 mmol/L (ref 96–106)
Creatinine, Ser: 1.04 mg/dL (ref 0.76–1.27)
GFR calc Af Amer: 100 mL/min/{1.73_m2} (ref >59)
GFR calc non Af Amer: 86 mL/min/{1.73_m2} (ref >59)
Globulin, Total: 3.2 g/dL (ref 1.5–4.5)
Glucose: 85 mg/dL (ref 65–99)
Potassium: 4.8 mmol/L (ref 3.5–5.2)
Sodium: 137 mmol/L (ref 134–144)
Total Protein: 7.9 g/dL (ref 6.0–8.5)

## 2018-09-02 LAB — CBC
Hematocrit: 46.2 % (ref 37.5–51.0)
Hemoglobin: 15.7 g/dL (ref 13.0–17.7)
MCH: 32.4 pg (ref 26.6–33.0)
MCHC: 34 g/dL (ref 31.5–35.7)
MCV: 96 fL (ref 79–97)
Platelets: 262 10*3/uL (ref 150–450)
RBC: 4.84 x10E6/uL (ref 4.14–5.80)
RDW: 14.7 % (ref 11.6–15.4)
WBC: 6.2 10*3/uL (ref 3.4–10.8)

## 2018-09-02 NOTE — Telephone Encounter (Signed)
Medical Assistant left message on patient's home and cell voicemail. Voicemail states to give a call back to [place] with [facility] at [phone]. Patient needs lab appointment this week or beginning of next for HEP C and B testing due to liver enzymes being up from alcohol consumption.

## 2018-09-02 NOTE — Addendum Note (Signed)
Addended by: Karle Plumber B on: 09/02/2018 11:10 AM   Modules accepted: Orders

## 2018-09-02 NOTE — Addendum Note (Signed)
Addended by: Karle Plumber B on: 09/02/2018 10:59 AM   Modules accepted: Orders

## 2018-09-02 NOTE — Telephone Encounter (Signed)
-----   Message from Ladell Pier, MD sent at 09/02/2018 11:05 AM EDT ----- Let pt know that his blood count shows no anemia.  Kidney function is normal.  Liver enzymes are elevated likely due to alcohol use.  I encourage him to discontinue use as discussed yesterday.  Because of the elevated liver enzymes, I would also like to screen him for hepatitis B and C.  I spoke with lab this am and these can not be added on with the blood drawn yesterday.  Has to be drawn in a different tube.  Please return to the lab with in the next few days to have these drawn.

## 2018-09-03 ENCOUNTER — Ambulatory Visit: Payer: Self-pay | Attending: Internal Medicine

## 2018-09-03 ENCOUNTER — Other Ambulatory Visit: Payer: Self-pay

## 2018-09-03 DIAGNOSIS — R945 Abnormal results of liver function studies: Secondary | ICD-10-CM

## 2018-09-03 DIAGNOSIS — R7989 Other specified abnormal findings of blood chemistry: Secondary | ICD-10-CM

## 2018-09-04 LAB — HEPATITIS B CORE AB W/REFLEX: Hep B Core Total Ab: NEGATIVE

## 2018-09-04 LAB — HEPATITIS B SURFACE ANTIGEN: Hepatitis B Surface Ag: NEGATIVE

## 2018-09-04 LAB — HEPATITIS C ANTIBODY: Hep C Virus Ab: <0.1 s/co ratio (ref 0.0–0.9)

## 2018-09-04 LAB — HEPATITIS B SURFACE ANTIBODY, QUANTITATIVE: Hepatitis B Surf Ab Quant: <3.1 m[IU]/mL — ABNORMAL LOW (ref >9.9)

## 2018-09-07 ENCOUNTER — Telehealth: Payer: Self-pay | Admitting: *Deleted

## 2018-09-07 NOTE — Telephone Encounter (Signed)
-----   Message from Ladell Pier, MD sent at 09/05/2018  9:59 AM EDT ----- Let pt know that screen for hepatitis C and B infection is negative meaning he does not have either of them.  So I think the abnormal liver function test is due to excessive alcohol use.  I encourage him to quit.

## 2018-09-07 NOTE — Telephone Encounter (Signed)
Patient verified DOB Patient is aware of being HEP B and C, so the elevated liver levels is due to consumption. Patient states he is replacing alcoholic drinks with juice and taking it a day at a time. No further questions.

## 2018-10-05 MED FILL — AMLODIPINE BESYLATE 5 MG TA: 5 | 30 days supply | Qty: 45 | Fill #1

## 2018-10-05 MED FILL — traZODone HCL 100 MG TABS: 100 | 30 days supply | Qty: 30 | Fill #1

## 2018-11-08 MED FILL — traZODone HCL 100 MG TABS: 100 | 30 days supply | Qty: 30 | Fill #2

## 2018-11-08 MED FILL — AMLODIPINE BESYLATE 5 MG TA: 5 | 30 days supply | Qty: 45 | Fill #2

## 2018-12-03 ENCOUNTER — Ambulatory Visit: Payer: Self-pay | Attending: Internal Medicine | Admitting: Internal Medicine

## 2018-12-03 ENCOUNTER — Encounter: Payer: Self-pay | Admitting: Internal Medicine

## 2018-12-03 ENCOUNTER — Other Ambulatory Visit: Payer: Self-pay

## 2018-12-03 VITALS — BP 138/90 | HR 91 | Temp 98.4°F | Resp 18 | Ht 73.0 in | Wt 200.0 lb

## 2018-12-03 DIAGNOSIS — I10 Essential (primary) hypertension: Secondary | ICD-10-CM

## 2018-12-03 DIAGNOSIS — Z2821 Immunization not carried out because of patient refusal: Secondary | ICD-10-CM

## 2018-12-03 DIAGNOSIS — F1020 Alcohol dependence, uncomplicated: Secondary | ICD-10-CM

## 2018-12-03 DIAGNOSIS — F17200 Nicotine dependence, unspecified, uncomplicated: Secondary | ICD-10-CM

## 2018-12-03 DIAGNOSIS — F10982 Alcohol use, unspecified with alcohol-induced sleep disorder: Secondary | ICD-10-CM

## 2018-12-03 MED ORDER — AMLODIPINE BESYLATE 10 MG PO TABS: 10.0000 mg | ORAL_TABLET | Freq: Every day | ORAL | 5 refills | Status: DC

## 2018-12-03 MED ORDER — TRAZODONE HCL 100 MG PO TABS: 150.0000 mg | ORAL_TABLET | Freq: Every evening | ORAL | 4 refills | Status: DC | PRN

## 2018-12-03 MED FILL — AMLODIPINE BESYLATE 10 MG T: 10 | 30 days supply | Qty: 30 | Fill #0

## 2018-12-03 MED FILL — traZODone HCL 100 MG TABS: 100 | 30 days supply | Qty: 45 | Fill #0

## 2018-12-03 NOTE — Patient Instructions (Signed)
Your blood pressure is not at goal which is 130/80 or lower.  Increase amlodipine to 10 mg daily.  Increase trazodone 250 mg at bedtime.

## 2018-12-03 NOTE — Progress Notes (Signed)
Patient ID: [name], male    DOB: 15-Nov-1975  MRN: [id number]  CC: Follow-up   Subjective: Thomas Atkins is a 43 y.o. male who presents for chronic ds management His concerns today include:  Patient with history of HTN, alcohol use disorder,tob dep, anx/dep, ?seizure disorder  HYPERTENSION Currently taking: see medication list Med Adherence: [x]  Yes on Norvasc 7.5 mg daily    []  No Medication side effects: []  Yes    [x]  No Adherence with salt restriction: [x]  Yes    []  No Home Monitoring?: []  Yes    [x]  No Monitoring Frequency: [x]  Yes    []  No Home BP results range: []  Yes    []  No SOB? []  Yes    [x]  No Chest Pain?: []  Yes    [x]  No Leg swelling?: []  Yes    [x]  No Headaches?: []  Yes    [x]  No Dizziness? []  Yes    [x]  No Comments:   Insomnia:  Having to take 2 Trazodone 100 mg at times Fulls asleep okay but does not stay asleep Sometimes takes Seroquel from his dad  Tob dep: 1 pk/day.  Not ready to quit yet  ETOH:  Down to a 6 pk/night.  On last visit he was at a 12 pack.  He is still desperately looking for a job.  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    Patient Active Problem List   Diagnosis Date Noted  . History of hypertension 07/31/2017  . Alcohol use disorder, moderate, dependence ([place]) 07/31/2017  . Tobacco dependence 07/31/2017  . Alcohol-induced insomnia ([place]) 07/31/2017  . Major depressive disorder, recurrent severe without psychotic features ([place]) 10/10/2015  . Alcohol abuse with alcohol-induced mood disorder ([place]) 05/31/2015     Current Outpatient Medications on File Prior to Visit  Medication Sig Dispense Refill  . amLODipine (NORVASC) 5 MG tablet Take 1.5 tablets (7.5 mg total) by mouth daily. 45 tablet 3  . traZODone (DESYREL) 100 MG tablet Take 1 tablet (100 mg total) by mouth at bedtime as needed for sleep. 30 tablet 3  . triamcinolone cream (KENALOG) 0.1 % Apply 1 application topically 2 (two)  times daily. 30 g 0   No current facility-administered medications on file prior to visit.     Allergies  Allergen Reactions  . Bee Venom Anaphylaxis  . Lactose Intolerance (Gi) Diarrhea, Nausea Only and Other (See Comments)    Abdominal pain    . Ultram [Tramadol Hcl] Hives         Social History   Socioeconomic History  . Marital status: Divorced    Spouse name: Not on file  . Number of children: 2  . Years of education: Not on file  . Highest education level: Not on file  Occupational History  . Occupation: Unemployed  Social Needs  . Financial resource strain: Not on file  . Food insecurity    Worry: Not on file    Inability: Not on file  . Transportation needs    Medical: Not on file    Non-medical: Not on file  Tobacco Use  . Smoking status: Current Every Day Smoker    Packs/day: 1.00    Years: 30.00    Pack years: 30.00    Types: Cigarettes  . Smokeless tobacco: Never Used  . Tobacco comment: Patient refused cessation materials  Substance and Sexual Activity  . Alcohol use: Yes    Alcohol/week: 24.0  standard drinks    Types: 24 Cans of beer per week    Comment: 320 oz daily  . Drug use: No    Comment: Denies drug use  . Sexual activity: Not on file  Lifestyle  . Physical activity    Days per week: Not on file    Minutes per session: Not on file  . Stress: Not on file  Relationships  . Social Musician on phone: Not on file    Gets together: Not on file    Attends religious service: Not on file    Active member of club or organization: Not on file    Attends meetings of clubs or organizations: Not on file    Relationship status: Not on file  . Intimate partner violence    Fear of current or ex partner: Not on file    Emotionally abused: Not on file    Physically abused: Not on file    Forced sexual activity: Not on file  Other Topics Concern  . Not on file  Social History Narrative  . Not on file    Family History  Problem  Relation Age of Onset  . Heart disease Paternal Grandmother   . Bipolar disorder Father     Past Surgical History:  Procedure Laterality Date  . SURGERY SCROTAL / TESTICULAR      ROS: Review of Systems Negative except as stated above  PHYSICAL EXAM: BP 125/88 (BP Location: Left Arm, Patient Position: Sitting, Cuff Size: Large)   Pulse 91   Temp 98.4 F (36.9 C) (Oral)   Resp 18   Ht 6\' 1"  (1.854 m)   Wt 200 lb (90.7 kg)   SpO2 97%   BMI 26.39 kg/m   Wt Readings from Last 3 Encounters:  12/03/18 200 lb (90.7 kg)  09/01/18 194 lb (88 kg)  09/03/17 171 lb 3.2 oz (77.7 kg)    Physical Exam  General appearance - alert, well appearing, and in no distress Mental status - normal mood, behavior, speech, dress, motor activity, and thought processes Neck - supple, no significant adenopathy Chest - clear to auscultation, no wheezes, rales or rhonchi, symmetric air entry Heart - normal rate, regular rhythm, normal S1, S2, no murmurs, rubs, clicks or gallops Extremities - peripheral pulses normal, no pedal edema, no clubbing or cyanosis  CMP Latest Ref Rng & Units 09/01/2018 07/31/2017 10/24/2016  Glucose 65 - 99 mg/dL 85 87 86  BUN 6 - 24 mg/dL 9 8 7   Creatinine 0.76 - 1.27 mg/dL 10/26/2016 1.19  Sodium 134 - 144 mmol/L 137 142 141  Potassium 3.5 - 5.2 mmol/L 4.8 4.9 4.0  Chloride 96 - 106 mmol/L 102 105 104  CO2 20 - 29 mmol/L 20 20 24   Calcium 8.7 - 10.2 mg/dL 9.9 1.47 9.2  Total Protein 6.0 - 8.5 g/dL 7.9 7.8 8.29)  Total Bilirubin 0.0 - 1.2 mg/dL 0.5 0.6 0.4  Alkaline Phos 39 - 117 IU/L 73 69 69  AST 0 - 40 IU/L 112(H) 43(H) 81(H)  ALT 0 - 44 IU/L 135(H) 67(H) 72(H)   Lipid Panel  No results found for: CHOL, TRIG, HDL, CHOLHDL, VLDL, LDLCALC, LDLDIRECT  CBC    Component Value Date/Time   WBC 6.2 09/01/2018 1146   WBC 7.2 10/24/2016 1519   RBC 4.84 09/01/2018 1146   RBC 4.41 10/24/2016 1519   HGB 15.7 09/01/2018 1146   HCT 46.2 09/01/2018 1146   PLT 262  09/01/2018  1146   MCV 96 09/01/2018 1146   MCH 32.4 09/01/2018 1146   MCH 34.5 (H) 10/24/2016 1519   MCHC 34.0 09/01/2018 1146   MCHC 36.0 10/24/2016 1519   RDW 14.7 09/01/2018 1146   LYMPHSABS 3.2 12/19/2015 0210   MONOABS 0.7 12/19/2015 0210   EOSABS 0.2 12/19/2015 0210   BASOSABS 0.0 12/19/2015 0210    ASSESSMENT AND PLAN: 1. Essential hypertension Not at goal.  Increase amlodipine to 10 mg daily - amLODipine (NORVASC) 10 MG tablet; Take 1 tablet (10 mg total) by mouth daily.  Dispense: 30 tablet; Refill: 5  2. Alcohol-induced insomnia (HCC) Discussed good sleep hygiene.  Advised against taking other peoples medications.  We agreed to increase his trazodone 250 mg at bedtime. - traZODone (DESYREL) 100 MG tablet; Take 1.5 tablets (150 mg total) by mouth at bedtime as needed for sleep.  Dispense: 45 tablet; Refill: 4  3. Alcohol use disorder, moderate, dependence (HCC) Commended him on cutting back from a 12pack to 6 pack a day.  Encouraged him to cut back even further  4. Tobacco dependence Advised to quit.  He is not ready to do so  5. Influenza vaccination declined   Patient was given the opportunity to ask questions.  Patient verbalized understanding of the plan and was able to repeat key elements of the plan.   No orders of the defined types were placed in this encounter.    Requested Prescriptions    No prescriptions requested or ordered in this encounter    No follow-ups on file.  Jonah Blueeborah , MD, FACP

## 2019-03-09 MED FILL — traZODone HCL 100 MG TABS: 100 | 30 days supply | Qty: 45 | Fill #1

## 2019-03-09 MED FILL — AMLODIPINE BESYLATE 10 MG T: 10 | 30 days supply | Qty: 30 | Fill #1

## 2019-04-07 ENCOUNTER — Ambulatory Visit: Payer: Self-pay | Attending: Internal Medicine | Admitting: Internal Medicine

## 2019-04-07 ENCOUNTER — Other Ambulatory Visit: Payer: Self-pay

## 2019-04-07 ENCOUNTER — Other Ambulatory Visit: Payer: Self-pay | Admitting: Internal Medicine

## 2019-04-07 DIAGNOSIS — F10982 Alcohol use, unspecified with alcohol-induced sleep disorder: Secondary | ICD-10-CM

## 2019-04-07 DIAGNOSIS — I10 Essential (primary) hypertension: Secondary | ICD-10-CM

## 2019-04-07 DIAGNOSIS — R3911 Hesitancy of micturition: Secondary | ICD-10-CM

## 2019-04-07 MED ORDER — TRAZODONE HCL 100 MG PO TABS: 150.0000 mg | ORAL_TABLET | Freq: Every evening | ORAL | 4 refills | Status: DC | PRN

## 2019-04-07 MED ORDER — AMLODIPINE BESYLATE 10 MG PO TABS: 10.0000 mg | ORAL_TABLET | Freq: Every day | ORAL | 5 refills | Status: DC

## 2019-04-07 MED ORDER — TAMSULOSIN HCL 0.4 MG PO CAPS: 0.4000 mg | ORAL_CAPSULE | Freq: Every day | ORAL | 3 refills | Status: DC

## 2019-04-07 NOTE — Progress Notes (Signed)
Pt states he has been having a hard time urinating. Pt states he has to sit down in order to urinate.  Pt states it's okay that it's not bothering him as much. Pt states he doesn't know if he has a stone   Pt states sometimes he has to take 2 tablet of trazadone to sleep  Pt states his bp has been good

## 2019-04-07 NOTE — Progress Notes (Signed)
Virtual Visit via Telephone Note Due to current restrictions/limitations of in-office visits due to the COVID-19 pandemic, this scheduled clinical appointment was converted to a telehealth visit  I connected with Thomas Atkins on 04/07/19 at 4:44 p.m by telephone and verified that I am speaking with the correct person using two identifiers. I am in my office.  The patient is at home.  Only the patient and myself participated in this encounter.  I discussed the limitations, risks, security and privacy concerns of performing an evaluation and management service by telephone and the availability of in person appointments. I also discussed with the patient that there may be a patient responsible charge related to this service. The patient expressed understanding and agreed to proceed.   History of Present Illness: Patient with history ofHTN,alcohol use disorder,tob dep, anx/dep, ?seizure disorder.  Last seen 11/2018.  Purpose of today's visit was chronic disease management.  Patient complains of problems with urination x2 weeks.  He states that he has to strain and sometimes having to sit down to get his urine flow started.  It occurs only first thing in the mornings and at nights before bed.  He denies any nocturia, dysuria, penile discharge.  After he voids he feels like he has not completely voided.  HTN: No device to check blood pressure but feels that his blood pressure is doing good.  He reports compliance with medication and low-salt diet.  Insomnia: On last visit we increase trazodone to 150 mg daily.  About twice a week he takes 200 mg if he has problems falling asleep.  Reports that he has cut back significantly on drinking beer..    Current Outpatient Medications on File Prior to Visit  Medication Sig Dispense Refill  . amLODipine (NORVASC) 10 MG tablet Take 1 tablet (10 mg total) by mouth daily. 30 tablet 5  . traZODone (DESYREL) 100 MG tablet Take 1.5 tablets (150 mg total) by mouth  at bedtime as needed for sleep. 45 tablet 4  . triamcinolone cream (KENALOG) 0.1 % Apply 1 application topically 2 (two) times daily. 30 g 0   No current facility-administered medications on file prior to visit.    Observations/Objective: No direct observation done as this was a telephone encounter.  Assessment and Plan: 1. Urinary hesitancy Advised patient to not increase the trazodone to 200 mg to make sure that it is not causing the current urinary symptoms.  He will come to the lab to have a PSA and urinalysis done. In the meantime I will try him with Flomax.  He will let me know if no improvement or any worsening - tamsulosin (FLOMAX) 0.4 MG CAPS capsule; Take 1 capsule (0.4 mg total) by mouth daily.  Dispense: 30 capsule; Refill: 3 - Urinalysis; Future - PSA; Future  2. Alcohol-induced insomnia (HCC) Continue trazodone at current dose of 150 mg daily.  Encourage good sleep hygiene  3. Essential hypertension Continue amlodipine   Follow Up Instructions: 3 mth    I discussed the assessment and treatment plan with the patient. The patient was provided an opportunity to ask questions and all were answered. The patient agreed with the plan and demonstrated an understanding of the instructions.   The patient was advised to call back or seek an in-person evaluation if the symptoms worsen or if the condition fails to improve as anticipated.  I provided 8  minutes of non-face-to-face time during this encounter.   Thomas Blue, MD

## 2019-04-08 MED FILL — traZODone HCL 100 MG TABS: 100 | 30 days supply | Qty: 45 | Fill #0

## 2019-04-08 MED FILL — TAMSULOSIN HCL 0.4 MG CAP: 0.4 | 30 days supply | Qty: 30 | Fill #0

## 2019-04-08 MED FILL — AMLODIPINE BESYLATE 10 MG T: 10 | 30 days supply | Qty: 30 | Fill #0

## 2019-05-09 MED FILL — AMLODIPINE BESYLATE 10 MG T: 10 | 30 days supply | Qty: 30 | Fill #1

## 2019-05-25 ENCOUNTER — Ambulatory Visit (HOSPITAL_COMMUNITY)
Admission: EM | Admit: 2019-05-25 | Discharge: 2019-05-25 | Disposition: A | Discharge status: Home / Self Care | Payer: Self-pay

## 2019-05-25 ENCOUNTER — Emergency Department (HOSPITAL_COMMUNITY): Payer: Self-pay

## 2019-05-25 ENCOUNTER — Inpatient Hospital Stay (HOSPITAL_COMMUNITY)
Admission: EM | Admit: 2019-05-25 | Discharge: 2019-05-26 | DRG: 379 | Disposition: A | Discharge status: Home / Self Care | Payer: Self-pay | Attending: Internal Medicine | Admitting: Internal Medicine

## 2019-05-25 ENCOUNTER — Other Ambulatory Visit: Payer: Self-pay

## 2019-05-25 DIAGNOSIS — G40909 Epilepsy, unspecified, not intractable, without status epilepticus: Secondary | ICD-10-CM | POA: Diagnosis present

## 2019-05-25 DIAGNOSIS — Z20822 Contact with and (suspected) exposure to covid-19: Secondary | ICD-10-CM | POA: Diagnosis present

## 2019-05-25 DIAGNOSIS — K219 Gastro-esophageal reflux disease without esophagitis: Secondary | ICD-10-CM | POA: Diagnosis present

## 2019-05-25 DIAGNOSIS — Z79899 Other long term (current) drug therapy: Secondary | ICD-10-CM

## 2019-05-25 DIAGNOSIS — F17210 Nicotine dependence, cigarettes, uncomplicated: Secondary | ICD-10-CM | POA: Diagnosis present

## 2019-05-25 DIAGNOSIS — F419 Anxiety disorder, unspecified: Secondary | ICD-10-CM | POA: Diagnosis present

## 2019-05-25 DIAGNOSIS — K921 Melena: Principal | ICD-10-CM | POA: Diagnosis present

## 2019-05-25 DIAGNOSIS — K922 Gastrointestinal hemorrhage, unspecified: Secondary | ICD-10-CM | POA: Diagnosis present

## 2019-05-25 DIAGNOSIS — I10 Essential (primary) hypertension: Secondary | ICD-10-CM | POA: Diagnosis present

## 2019-05-25 DIAGNOSIS — F1010 Alcohol abuse, uncomplicated: Secondary | ICD-10-CM | POA: Diagnosis present

## 2019-05-25 DIAGNOSIS — R109 Unspecified abdominal pain: Secondary | ICD-10-CM

## 2019-05-25 DIAGNOSIS — F329 Major depressive disorder, single episode, unspecified: Secondary | ICD-10-CM | POA: Diagnosis present

## 2019-05-25 LAB — URINALYSIS, ROUTINE W REFLEX MICROSCOPIC
Bilirubin Urine: NEGATIVE
Glucose, UA: NEGATIVE mg/dL
Hgb urine dipstick: NEGATIVE
Ketones, ur: NEGATIVE mg/dL
Leukocytes,Ua: NEGATIVE
Nitrite: NEGATIVE
Protein, ur: NEGATIVE mg/dL
Specific Gravity, Urine: 1.006 (ref 1.005–1.030)
pH: 5 (ref 5.0–8.0)

## 2019-05-25 LAB — COMPREHENSIVE METABOLIC PANEL
ALT: 72 U/L — ABNORMAL HIGH (ref 0–44)
AST: 119 U/L — ABNORMAL HIGH (ref 15–41)
Albumin: 3.6 g/dL (ref 3.5–5.0)
Alkaline Phosphatase: 96 U/L (ref 38–126)
Anion gap: 15 (ref 5–15)
BUN: 5 mg/dL — ABNORMAL LOW (ref 6–20)
CO2: 19 mmol/L — ABNORMAL LOW (ref 22–32)
Calcium: 9.3 mg/dL (ref 8.9–10.3)
Chloride: 103 mmol/L (ref 98–111)
Creatinine, Ser: 0.8 mg/dL (ref 0.61–1.24)
GFR calc Af Amer: >60 mL/min (ref >60)
GFR calc non Af Amer: >60 mL/min (ref >60)
Glucose, Bld: 99 mg/dL (ref 70–99)
Potassium: 4 mmol/L (ref 3.5–5.1)
Sodium: 137 mmol/L (ref 135–145)
Total Bilirubin: 0.9 mg/dL (ref 0.3–1.2)
Total Protein: 8.1 g/dL (ref 6.5–8.1)

## 2019-05-25 LAB — ABO/RH: ABO/RH(D): O POS

## 2019-05-25 LAB — TYPE AND SCREEN
ABO/RH(D): O POS
Antibody Screen: NEGATIVE

## 2019-05-25 LAB — CBC
HCT: 45 % (ref 39.0–52.0)
Hemoglobin: 15.2 g/dL (ref 13.0–17.0)
MCH: 32 pg (ref 26.0–34.0)
MCHC: 33.8 g/dL (ref 30.0–36.0)
MCV: 94.7 fL (ref 80.0–100.0)
Platelets: 229 10*3/uL (ref 150–400)
RBC: 4.75 MIL/uL (ref 4.22–5.81)
RDW: 14.2 % (ref 11.5–15.5)
WBC: 8 10*3/uL (ref 4.0–10.5)
nRBC: 0 % (ref 0.0–0.2)

## 2019-05-25 LAB — POC OCCULT BLOOD, ED: Fecal Occult Bld: POSITIVE — AB

## 2019-05-25 LAB — LIPASE, BLOOD: Lipase: 28 U/L (ref 11–51)

## 2019-05-25 MED ORDER — LORAZEPAM 2 MG/ML IJ SOLN: 0.0000 mg | Freq: Two times a day (BID) | INTRAMUSCULAR | Status: DC

## 2019-05-25 MED ORDER — LORAZEPAM 2 MG/ML IJ SOLN
1.0000 mg | Freq: Once | INTRAMUSCULAR | Status: AC
  Administered 2019-05-25: 1 mg via INTRAVENOUS
  Filled 2019-05-25: qty 1

## 2019-05-25 MED ORDER — SODIUM CHLORIDE 0.9% FLUSH
3.0000 mL | Freq: Once | INTRAVENOUS | Status: AC
  Administered 2019-05-25: 3 mL via INTRAVENOUS

## 2019-05-25 MED ORDER — LORAZEPAM 2 MG/ML IJ SOLN
0.0000 mg | Freq: Four times a day (QID) | INTRAMUSCULAR | Status: DC
  Administered 2019-05-26: 4 mg via INTRAVENOUS
  Filled 2019-05-25: qty 2

## 2019-05-25 MED ORDER — LORAZEPAM 1 MG PO TABS: 0.0000 mg | ORAL_TABLET | Freq: Two times a day (BID) | ORAL | Status: DC

## 2019-05-25 MED ORDER — THIAMINE HCL 100 MG/ML IJ SOLN
100.0000 mg | Freq: Every day | INTRAMUSCULAR | Status: DC
  Administered 2019-05-26: 100 mg via INTRAVENOUS
  Filled 2019-05-25: qty 2

## 2019-05-25 MED ORDER — LORAZEPAM 1 MG PO TABS: 0.0000 mg | ORAL_TABLET | Freq: Four times a day (QID) | ORAL | Status: DC

## 2019-05-25 MED ORDER — PANTOPRAZOLE SODIUM 40 MG IV SOLR
40.0000 mg | Freq: Once | INTRAVENOUS | Status: AC
  Administered 2019-05-26: 40 mg via INTRAVENOUS
  Filled 2019-05-25: qty 40

## 2019-05-25 MED ORDER — IOHEXOL 300 MG/ML  SOLN
100.0000 mL | Freq: Once | INTRAMUSCULAR | Status: AC | PRN
  Administered 2019-05-25: 100 mL via INTRAVENOUS

## 2019-05-25 MED ORDER — THIAMINE HCL 100 MG PO TABS
100.0000 mg | ORAL_TABLET | Freq: Every day | ORAL | Status: DC
  Filled 2019-05-25: qty 1

## 2019-05-25 NOTE — ED Triage Notes (Signed)
Pt here from urgent care, sts for several days he's had pain from his umbilicus to his chest. Blood in stool x 3 days, pain with same. Abdominal pain with movement or coughing.

## 2019-05-25 NOTE — ED Provider Notes (Signed)
[facility] EMERGENCY DEPARTMENT Provider Note   CSN: [id number] Arrival date & time: 05/25/19  1647     History Chief Complaint  Patient presents with  . Abdominal Pain    Thomas Atkins is a 44 y.o. male.  HPI Patient presents to the emergency department with bowel movements that have had blood in them along with abdominal pain that started 2 days ago.  Patient states that he is a fairly heavy drinker and drinks about a case of beer a day.  The patient states that he is never had GI bleeding in the past.  The patient states that nothing seems to make his condition better or worse.  The patient states that he did not take any medications prior to arrival for his symptoms.  The patient denies chest pain, shortness of breath, headache,blurred vision, neck pain, fever, cough, weakness, numbness, dizziness, anorexia, edema,  nausea, vomiting, diarrhea, rash, back pain, dysuria, hematemesis, near syncope, or syncope.    Past Medical History:  Diagnosis Date  . Alcoholism (HCC)   . Anxiety   . Depression   . Seizure disorder (HCC)   . Syncope     Patient Active Problem List   Diagnosis Date Noted  . History of hypertension 07/31/2017  . Alcohol use disorder, moderate, dependence (HCC) 07/31/2017  . Tobacco dependence 07/31/2017  . Alcohol-induced insomnia (HCC) 07/31/2017  . Major depressive disorder, recurrent severe without psychotic features (HCC) 10/10/2015  . Alcohol abuse with alcohol-induced mood disorder (HCC) 05/31/2015    Past Surgical History:  Procedure Laterality Date  . SURGERY SCROTAL / TESTICULAR         Family History  Problem Relation Age of Onset  . Heart disease Paternal Grandmother   . Bipolar disorder Father     Social History   Tobacco Use  . Smoking status: Current Every Day Smoker    Packs/day: 1.00    Years: 30.00    Pack years: 30.00    Types: Cigarettes  . Smokeless tobacco: Never Used  . Tobacco comment: Patient  refused cessation materials  Substance Use Topics  . Alcohol use: Yes    Alcohol/week: 24.0 standard drinks    Types: 24 Cans of beer per week    Comment: 320 oz daily  . Drug use: No    Comment: Denies drug use    Home Medications Prior to Admission medications   Medication Sig Start Date End Date Taking? Authorizing Provider  amLODipine (NORVASC) 10 MG tablet Take 1 tablet (10 mg total) by mouth daily. 04/07/19  Yes [name], MD  tamsulosin (FLOMAX) 0.4 MG CAPS capsule Take 1 capsule (0.4 mg total) by mouth daily. 04/07/19  Yes [name], MD  traZODone (DESYREL) 100 MG tablet Take 1.5 tablets (150 mg total) by mouth at bedtime as needed for sleep. 04/07/19  Yes [name], MD  triamcinolone cream (KENALOG) 0.1 % Apply 1 application topically 2 (two) times daily. Patient not taking: Reported on 05/25/2019 09/01/18   [name], MD    Allergies    Bee venom, Lactose intolerance (gi), and Ultram [tramadol hcl]  Review of Systems   Review of Systems All other systems negative except as documented in the HPI. All pertinent positives and negatives as reviewed in the HPI. Physical Exam Updated Vital Signs BP 118/84   Pulse 90   Temp 98.3 F (36.8 C) (Oral)   Resp 16   Ht 6\' 1"  (1.854 m)   Wt  91.6 kg   SpO2 95%   BMI 26.65 kg/m   Physical Exam Vitals and nursing note reviewed.  Constitutional:      General: He is not in acute distress.    Appearance: He is well-developed.  HENT:     Head: Normocephalic and atraumatic.  Eyes:     Pupils: Pupils are equal, round, and reactive to light.  Cardiovascular:     Rate and Rhythm: Normal rate and regular rhythm.     Heart sounds: Normal heart sounds. No murmur. No friction rub. No gallop.   Pulmonary:     Effort: Pulmonary effort is normal. No respiratory distress.     Breath sounds: Normal breath sounds. No wheezing.  Abdominal:     General: Bowel sounds are normal. There is no distension.      Palpations: Abdomen is soft.     Tenderness: There is abdominal tenderness in the right upper quadrant, epigastric area and periumbilical area. There is guarding. There is no rebound.  Musculoskeletal:     Cervical back: Normal range of motion and neck supple.  Skin:    General: Skin is warm and dry.     Capillary Refill: Capillary refill takes less than 2 seconds.     Findings: No erythema or rash.  Neurological:     Mental Status: He is alert and oriented to person, place, and time.     Motor: No abnormal muscle tone.     Coordination: Coordination normal.  Psychiatric:        Behavior: Behavior normal.     ED Results / Procedures / Treatments   Labs (all labs ordered are listed, but only abnormal results are displayed) Labs Reviewed  COMPREHENSIVE METABOLIC PANEL - Abnormal; Notable for the following components:      Result Value   CO2 19 (*)    BUN 5 (*)    AST 119 (*)    ALT 72 (*)    All other components within normal limits  POC OCCULT BLOOD, ED - Abnormal; Notable for the following components:   Fecal Occult Bld POSITIVE (*)    All other components within normal limits  LIPASE, BLOOD  CBC  URINALYSIS, ROUTINE W REFLEX MICROSCOPIC  ETHANOL  TYPE AND SCREEN  ABO/RH    EKG None  Radiology CT Abdomen Pelvis W Contrast  Result Date: 05/25/2019 CLINICAL DATA:  Abdominal pain. Patient reports blood in stool. EXAM: CT ABDOMEN AND PELVIS WITH CONTRAST TECHNIQUE: Multidetector CT imaging of the abdomen and pelvis was performed using the standard protocol following bolus administration of intravenous contrast. CONTRAST:  12mL OMNIPAQUE IOHEXOL 300 MG/ML  SOLN COMPARISON:  None. FINDINGS: Lower chest: Lung bases are clear. Trace pericardial effusion. Liver is enlarged spanning scratch Hepatobiliary: Liver is enlarged spanning greater than 22 cm cranial caudal. Diffuse low density consistent with steatosis. Geographic fatty deposition in the periphery. No evidence of focal  hepatic lesion. Gallbladder is only minimally distended, however question of wall enhancement and pericholecystic edema. No calcified gallstone. No biliary dilatation. Small amount of perihepatic ascites and mild periportal edema. Pancreas: No ductal dilatation or inflammation. Spleen: Normal in size without focal abnormality. Splenule centrally. Adrenals/Urinary Tract: Normal adrenal glands. No hydronephrosis or perinephric edema. Homogeneous renal enhancement with symmetric excretion on delayed phase imaging. Tiny hypodensity arising from the lower right kidney is too small to characterize, likely small cyst. Urinary bladder is physiologically distended without wall thickening. Stomach/Bowel: Ingested material in the stomach. Mild fat stranding adjacent to the duodenum  which appears contiguous with the pericholecystic stranding. No definite duodenal wall thickening. No small bowel obstruction. No evidence of small bowel inflammation. Small volume of colonic stool. No colonic wall thickening or inflammation. Normal appendix. Vascular/Lymphatic: Abdominal aorta is normal in caliber. Mild aortic atherosclerosis. The portal vein is patent. Few prominent reactive appearing inguinal lymph nodes. Reproductive: Prostate is unremarkable. Other: Small volume abdominopelvic ascites. Perihepatic free fluid with ascites tracking in the mesentery, both pericolic gutters, and into the pelvis. No free air. No evidence of focal abscess. Minimal subcutaneous soft tissue edema about the umbilicus without focal fluid collection. Musculoskeletal: There are no acute or suspicious osseous abnormalities. IMPRESSION: 1. Hepatomegaly and hepatic steatosis. Small volume abdominopelvic ascites. 2. Gallbladder is only minimally distended, however question of wall enhancement and pericholecystic edema. This may be secondary to hepatic inflammation, however if there is clinical concern for acute gallbladder pathology, recommend right upper  quadrant ultrasound. 3. Fat stranding about the duodenum appears contiguous with the gallbladder fossa. Duodenitis is considered. 4. Subcutaneous soft tissue edema and fat stranding about the umbilicus without focal fluid collection. Aortic Atherosclerosis (ICD10-I70.0). Electronically Signed   By: Narda Rutherford M.D.   On: 05/25/2019 20:51   US Abdomen Limited  Result Date: 05/25/2019 CLINICAL DATA:  Right upper quadrant abdominal pain. EXAM: ULTRASOUND ABDOMEN LIMITED RIGHT UPPER QUADRANT COMPARISON:  CT from the same day. FINDINGS: Gallbladder: The sonographic Eulah Pont sign is positive. There is gallbladder wall thickening with the gallbladder wall measuring approximately 5 mm in thickness. There are no gallstones. There is some pericholecystic free fluid. Common bile duct: Diameter: 5 mm Liver: Diffuse increased echogenicity with slightly heterogeneous liver. Appearance typically secondary to fatty infiltration. Fibrosis secondary consideration. No secondary findings of cirrhosis noted. No focal hepatic lesion or intrahepatic biliary duct dilatation. Portal vein is patent on color Doppler imaging with normal direction of blood flow towards the liver. Other: None. IMPRESSION: 1. Findings are equivocal for acute acalculous cholecystitis. There is some gallbladder wall thickening in the presence of a positive sonographic Murphy sign, however the gallbladder is not distended and there are no gallstones. Consider further evaluation with a HIDA scan as clinically indicated. 2. Hepatic steatosis. Electronically Signed   By: Katherine Mantle M.D.   On: 05/25/2019 22:08    Procedures Procedures (including critical care time)  Medications Ordered in ED Medications  LORazepam (ATIVAN) injection 0-4 mg (has no administration in time range)    Or  LORazepam (ATIVAN) tablet 0-4 mg (has no administration in time range)  LORazepam (ATIVAN) injection 0-4 mg (has no administration in time range)    Or    LORazepam (ATIVAN) tablet 0-4 mg (has no administration in time range)  thiamine tablet 100 mg (has no administration in time range)    Or  thiamine (B-1) injection 100 mg (has no administration in time range)  pantoprazole (PROTONIX) injection 40 mg (has no administration in time range)  sodium chloride flush (NS) 0.9 % injection 3 mL (3 mLs Intravenous Given 05/25/19 1827)  iohexol (OMNIPAQUE) 300 MG/ML solution 100 mL (100 mLs Intravenous Contrast Given 05/25/19 2026)  LORazepam (ATIVAN) injection 1 mg (1 mg Intravenous Given 05/25/19 2233)    ED Course  I have reviewed the triage vital signs and the nursing notes.  Pertinent labs & imaging results that were available during my care of the patient were reviewed by me and considered in my medical decision making (see chart for details).    MDM Rules/Calculators/A&P  To reviewing the CT scan and ultrasound imaging I spoke with general surgeon about the patient.  He felt that the patient will need medical admission and further evaluation of these findings.  He states that there needs to be further delineation if this is truly due to gallbladder disease or some sort of peptic ulcer disease due to his heavy drinking.  I spoke with the [facility] hospitalist and advised her of the discussion with the surgeon.  She will admit the patient for further evaluation and care the patient did have Hemoccult positive stools.  The patient is advised of the plan and all questions were answered. Final Clinical Impression(s) / ED Diagnoses Final diagnoses:  Abdominal pain, unspecified abdominal location    Rx / DC Orders ED Discharge Orders    None       Kyra Manges 05/25/19 2359    Jacalyn Lefevre, MD 05/27/19 2009

## 2019-05-25 NOTE — ED Triage Notes (Signed)
Pt reports bleeding from rectum during bowel movements three times this week.  First incident was pure blood and blood clots with no stool.  Reports abdominal pain with bumping and walking.  Feels that there is a hernia in right umbilical area.  Abdomen noticeably distended and hard.  Pt reports this occurring over the previous 1-2 weeks as well as decreased appetite.  Conferred with provider and spoke to pt, advising that he will likely need a scan of his abdomen.  Pt prefers to go straight to ED where they can perform scan if needed.  Pt ok to ambulate to ED at his preference, per provider.

## 2019-05-26 ENCOUNTER — Inpatient Hospital Stay (HOSPITAL_COMMUNITY): Payer: Self-pay

## 2019-05-26 ENCOUNTER — Encounter (HOSPITAL_COMMUNITY): Payer: Self-pay | Admitting: Internal Medicine

## 2019-05-26 DIAGNOSIS — K922 Gastrointestinal hemorrhage, unspecified: Secondary | ICD-10-CM

## 2019-05-26 DIAGNOSIS — R109 Unspecified abdominal pain: Secondary | ICD-10-CM

## 2019-05-26 LAB — CBC WITH DIFFERENTIAL/PLATELET
Abs Immature Granulocytes: 0.02 10*3/uL (ref 0.00–0.07)
Basophils Absolute: 0 10*3/uL (ref 0.0–0.1)
Basophils Relative: 1 %
Eosinophils Absolute: 0.2 10*3/uL (ref 0.0–0.5)
Eosinophils Relative: 3 %
HCT: 43.1 % (ref 39.0–52.0)
Hemoglobin: 14.5 g/dL (ref 13.0–17.0)
Immature Granulocytes: 0 %
Lymphocytes Relative: 30 %
Lymphs Abs: 1.8 10*3/uL (ref 0.7–4.0)
MCH: 32.2 pg (ref 26.0–34.0)
MCHC: 33.6 g/dL (ref 30.0–36.0)
MCV: 95.6 fL (ref 80.0–100.0)
Monocytes Absolute: 0.6 10*3/uL (ref 0.1–1.0)
Monocytes Relative: 10 %
Neutro Abs: 3.4 10*3/uL (ref 1.7–7.7)
Neutrophils Relative %: 56 %
Platelets: 216 10*3/uL (ref 150–400)
RBC: 4.51 MIL/uL (ref 4.22–5.81)
RDW: 14.3 % (ref 11.5–15.5)
WBC: 6 10*3/uL (ref 4.0–10.5)
nRBC: 0 % (ref 0.0–0.2)

## 2019-05-26 LAB — ETHANOL: Alcohol, Ethyl (B): <10 mg/dL (ref <10)

## 2019-05-26 LAB — HEMOGLOBIN A1C
Hgb A1c MFr Bld: 5.9 % — ABNORMAL HIGH (ref 4.8–5.6)
Mean Plasma Glucose: 122.63 mg/dL

## 2019-05-26 LAB — TSH: TSH: 10.668 u[IU]/mL — ABNORMAL HIGH (ref 0.350–4.500)

## 2019-05-26 LAB — HEMOGLOBIN: Hemoglobin: 13.8 g/dL (ref 13.0–17.0)

## 2019-05-26 LAB — SARS CORONAVIRUS 2 (TAT 6-24 HRS): SARS Coronavirus 2: NEGATIVE

## 2019-05-26 LAB — HIV ANTIBODY (ROUTINE TESTING W REFLEX): HIV Screen 4th Generation wRfx: REACTIVE — AB

## 2019-05-26 MED ORDER — MORPHINE SULFATE (PF) 2 MG/ML IV SOLN: 2.0000 mg | INTRAVENOUS | Status: DC | PRN

## 2019-05-26 MED ORDER — PIPERACILLIN-TAZOBACTAM 3.375 G IVPB 30 MIN
3.3750 g | Freq: Once | INTRAVENOUS | Status: AC
  Administered 2019-05-26: 3.375 g via INTRAVENOUS
  Filled 2019-05-26: qty 50

## 2019-05-26 MED ORDER — PIPERACILLIN-TAZOBACTAM 3.375 G IVPB
3.3750 g | Freq: Three times a day (TID) | INTRAVENOUS | Status: DC
  Administered 2019-05-26: 3.375 g via INTRAVENOUS
  Filled 2019-05-26: qty 50

## 2019-05-26 MED ORDER — SODIUM CHLORIDE 0.9 % IV SOLN: INTRAVENOUS | Status: DC

## 2019-05-26 MED ORDER — ONDANSETRON HCL 4 MG/2ML IJ SOLN: 4.0000 mg | Freq: Four times a day (QID) | INTRAMUSCULAR | Status: DC | PRN

## 2019-05-26 MED ORDER — THIAMINE HCL 100 MG/ML IJ SOLN
Freq: Once | INTRAVENOUS | Status: AC
  Filled 2019-05-26: qty 1000

## 2019-05-26 MED ORDER — ACETAMINOPHEN 650 MG RE SUPP: 650.0000 mg | Freq: Four times a day (QID) | RECTAL | Status: DC | PRN

## 2019-05-26 MED ORDER — ONDANSETRON HCL 4 MG PO TABS: 4.0000 mg | ORAL_TABLET | Freq: Four times a day (QID) | ORAL | Status: DC | PRN

## 2019-05-26 MED ORDER — ACETAMINOPHEN 325 MG PO TABS: 650.0000 mg | ORAL_TABLET | Freq: Four times a day (QID) | ORAL | Status: DC | PRN

## 2019-05-26 MED ORDER — PANTOPRAZOLE SODIUM 40 MG PO TBEC: 40.0000 mg | DELAYED_RELEASE_TABLET | Freq: Every day | ORAL | 1 refills | Status: DC

## 2019-05-26 MED ORDER — PANTOPRAZOLE SODIUM 40 MG IV SOLR
40.0000 mg | Freq: Two times a day (BID) | INTRAVENOUS | Status: DC
  Administered 2019-05-26: 40 mg via INTRAVENOUS
  Filled 2019-05-26: qty 40

## 2019-05-26 MED ORDER — TECHNETIUM TC 99M MEBROFENIN IV KIT
5.4000 | PACK | Freq: Once | INTRAVENOUS | Status: AC | PRN
  Administered 2019-05-26: 5.4 via INTRAVENOUS

## 2019-05-26 NOTE — Consult Note (Signed)
[facility] Consultation Note  Referring Provider: [facility] Primary Care Physician:  [name], MD  Reason for Consultation:  Abdominal pain, hematochezia  HPI: Thomas Atkins is a 44 y.o. male presenting with rectal bleeding and abdominal pain.  He notes three days of rectal bleeding earlier in the week, but he believes it was related to hemorrhoids, and the bleeding has stopped since that time.  He has had abdominal pain for several weeks and continues to have generalized abdominal pain, though he notes it is worst near his umbilicus and epigastrium.  He denies nausea, vomiting, hematochezia, or melena.  He has abdominal distention versus abdominal lipose.  He denies changes in appetite or weight loss.  He has never had a colonoscopy or an upper endoscopy.     Past Medical History:  Diagnosis Date  . Alcoholism (HCC)   . Anxiety   . Depression   . Seizure disorder (HCC)   . Syncope     Past Surgical History:  Procedure Laterality Date  . SURGERY SCROTAL / TESTICULAR      Prior to Admission medications   Medication Sig Start Date End Date Taking? Authorizing Provider  amLODipine (NORVASC) 10 MG tablet Take 1 tablet (10 mg total) by mouth daily. 04/07/19  Yes [name], MD  tamsulosin (FLOMAX) 0.4 MG CAPS capsule Take 1 capsule (0.4 mg total) by mouth daily. 04/07/19  Yes [name], MD  traZODone (DESYREL) 100 MG tablet Take 1.5 tablets (150 mg total) by mouth at bedtime as needed for sleep. 04/07/19  Yes [name], MD  triamcinolone cream (KENALOG) 0.1 % Apply 1 application topically 2 (two) times daily. Patient not taking: Reported on 05/25/2019 09/01/18   [name], MD    Current Facility-Administered Medications  Medication Dose Route Frequency Provider Last Rate Last Admin  . 0.9 %  sodium chloride infusion   Intravenous Continuous Skip Mayer A, MD      . acetaminophen (TYLENOL) tablet 650 mg  650 mg Oral Q6H  PRN Lurline Del, MD       Or  . acetaminophen (TYLENOL) suppository 650 mg  650 mg Rectal Q6H PRN Skip Mayer A, MD      . LORazepam (ATIVAN) injection 0-4 mg  0-4 mg Intravenous Q6H Lawyer, Christopher, PA-C   4 mg at 05/26/19 1209   Or  . LORazepam (ATIVAN) tablet 0-4 mg  0-4 mg Oral Q6H Lawyer, Christopher, PA-C      . [START ON 05/28/2019] LORazepam (ATIVAN) injection 0-4 mg  0-4 mg Intravenous Q12H Lawyer, Christopher, PA-C       Or  . Melene Muller ON 05/28/2019] LORazepam (ATIVAN) tablet 0-4 mg  0-4 mg Oral Q12H Lawyer, Christopher, PA-C      . morphine 2 MG/ML injection 2 mg  2 mg Intravenous Q4H PRN Skip Mayer A, MD      . ondansetron (ZOFRAN) tablet 4 mg  4 mg Oral Q6H PRN Lurline Del, MD       Or  . ondansetron (ZOFRAN) injection 4 mg  4 mg Intravenous Q6H PRN Skip Mayer A, MD      . pantoprazole (PROTONIX) injection 40 mg  40 mg Intravenous Q12H Skip Mayer A, MD   40 mg at 05/26/19 0825  . piperacillin-tazobactam (ZOSYN) IVPB 3.375 g  3.375 g Intravenous Q8H Skip Mayer A, MD 12.5 mL/hr at 05/26/19 1212 3.375 g at 05/26/19 1212  . thiamine tablet 100 mg  100 mg Oral  Daily Lawyer, Cristal Deer, PA-C       Or  . thiamine (B-1) injection 100 mg  100 mg Intravenous Daily Charlestine Night, PA-C   100 mg at 05/26/19 0825    Allergies as of 05/25/2019 - Review Complete 05/25/2019  Allergen Reaction Noted  . Bee venom Anaphylaxis 12/19/2015  . Lactose intolerance (gi) Diarrhea, Nausea Only, and Other (See Comments) 12/01/2013  . Ultram [tramadol hcl] Hives 12/13/2010    Family History  Problem Relation Age of Onset  . Heart disease Paternal Grandmother   . Bipolar disorder Father     Social History   Socioeconomic History  . Marital status: Divorced    Spouse name: Not on file  . Number of children: 2  . Years of education: Not on file  . Highest education level: Not on file  Occupational History  . Occupation: Unemployed   Tobacco Use  . Smoking status: Current Every Day Smoker    Packs/day: 1.00    Years: 30.00    Pack years: 30.00    Types: Cigarettes  . Smokeless tobacco: Never Used  . Tobacco comment: Patient refused cessation materials  Substance and Sexual Activity  . Alcohol use: Yes    Alcohol/week: 24.0 standard drinks    Types: 24 Cans of beer per week    Comment: 320 oz daily  . Drug use: No    Comment: Denies drug use  . Sexual activity: Not on file  Other Topics Concern  . Not on file  Social History Narrative  . Not on file   Social Determinants of Health   Financial Resource Strain:   . Difficulty of Paying Living Expenses:   Food Insecurity:   . Worried About Programme researcher, broadcasting/film/video in the Last Year:   . Barista in the Last Year:   Transportation Needs:   . Freight forwarder (Medical):   Marland Kitchen Lack of Transportation (Non-Medical):   Physical Activity:   . Days of Exercise per Week:   . Minutes of Exercise per Session:   Stress:   . Feeling of Stress :   Social Connections:   . Frequency of Communication with Friends and Family:   . Frequency of Social Gatherings with Friends and Family:   . Attends Religious Services:   . Active Member of Clubs or Organizations:   . Attends Banker Meetings:   Marland Kitchen Marital Status:   Intimate Partner Violence:   . Fear of Current or Ex-Partner:   . Emotionally Abused:   Marland Kitchen Physically Abused:   . Sexually Abused:     Review of Systems: As per HPI, all others negative  Physical Exam: Vital signs in last 24 hours: Temp:  [98.2 F (36.8 C)-98.8 F (37.1 C)] 98.2 F (36.8 C) (03/18 0836) Pulse Rate:  [79-105] 80 (03/18 0836) Resp:  [16] 16 (03/18 0836) BP: (90-125)/(65-100) 90/65 (03/18 0836) SpO2:  [93 %-97 %] 93 % (03/18 0325) Weight:  [91.6 kg-93.6 kg] 93.6 kg (03/18 0325) Last BM Date: 05/25/19 General:   Alert,  Well-developed, well-nourished, pleasant and cooperative in NAD Head:  Normocephalic and  atraumatic. Eyes:  Sclera clear, no icterus.   Conjunctiva pink. Ears:  Normal auditory acuity. Nose:  No deformity, discharge,  or lesions. Mouth:  No deformity or lesions.  Oropharynx pink & moist. Neck:  Supple; no masses or thyromegaly. Lungs:  Clear throughout to auscultation.   No wheezes, crackles, or rhonchi. No acute distress. Heart:  Regular rate and  rhythm; no murmurs, clicks, rubs,  or gallops. Abdomen:  Soft, protuberant, mild tenderness. No masses, hepatosplenomegaly or hernias noted. Normal bowel sounds, without guarding, and without rebound.     Msk:  Symmetrical without gross deformities. Normal posture. Pulses:  Normal pulses noted. Extremities:  Without clubbing or edema. Neurologic:  Alert and  oriented x4;  grossly normal neurologically. Skin:  Intact without significant lesions or rashes. Cervical Nodes:  No significant cervical adenopathy. Psych:  Alert and cooperative. Normal mood and affect.   Lab Results: Recent Labs    05/25/19 1705 05/26/19 0721 05/26/19 1327  WBC 8.0 6.0  --   HGB 15.2 14.5 13.8  HCT 45.0 43.1  --   PLT 229 216  --    BMET Recent Labs    05/25/19 1705  NA 137  K 4.0  CL 103  CO2 19*  GLUCOSE 99  BUN 5*  CREATININE 0.80  CALCIUM 9.3   LFT Recent Labs    05/25/19 1705  PROT 8.1  ALBUMIN 3.6  AST 119*  ALT 72*  ALKPHOS 96  BILITOT 0.9   PT/INR No results for input(s): LABPROT, INR in the last 72 hours.  Studies/Results: CT Abdomen Pelvis W Contrast  Result Date: 05/25/2019 CLINICAL DATA:  Abdominal pain. Patient reports blood in stool. EXAM: CT ABDOMEN AND PELVIS WITH CONTRAST TECHNIQUE: Multidetector CT imaging of the abdomen and pelvis was performed using the standard protocol following bolus administration of intravenous contrast. CONTRAST:  OMNIPAQUE IOHEXOL 300 MG/ML  SOLN COMPARISON:  None. FINDINGS: Lower chest: Lung bases are clear. Trace pericardial effusion. Liver is enlarged spanning scratch  Hepatobiliary: Liver is enlarged spanning greater than 22 cm cranial caudal. Diffuse low density consistent with steatosis. Geographic fatty deposition in the periphery. No evidence of focal hepatic lesion. Gallbladder is only minimally distended, however question of wall enhancement and pericholecystic edema. No calcified gallstone. No biliary dilatation. Small amount of perihepatic ascites and mild periportal edema. Pancreas: No ductal dilatation or inflammation. Spleen: Normal in size without focal abnormality. Splenule centrally. Adrenals/Urinary Tract: Normal adrenal glands. No hydronephrosis or perinephric edema. Homogeneous renal enhancement with symmetric excretion on delayed phase imaging. Tiny hypodensity arising from the lower right kidney is too small to characterize, likely small cyst. Urinary bladder is physiologically distended without wall thickening. Stomach/Bowel: Ingested material in the stomach. Mild fat stranding adjacent to the duodenum which appears contiguous with the pericholecystic stranding. No definite duodenal wall thickening. No small bowel obstruction. No evidence of small bowel inflammation. Small volume of colonic stool. No colonic wall thickening or inflammation. Normal appendix. Vascular/Lymphatic: Abdominal aorta is normal in caliber. Mild aortic atherosclerosis. The portal vein is patent. Few prominent reactive appearing inguinal lymph nodes. Reproductive: Prostate is unremarkable. Other: Small volume abdominopelvic ascites. Perihepatic free fluid with ascites tracking in the mesentery, both pericolic gutters, and into the pelvis. No free air. No evidence of focal abscess. Minimal subcutaneous soft tissue edema about the umbilicus without focal fluid collection. Musculoskeletal: There are no acute or suspicious osseous abnormalities. IMPRESSION: 1. Hepatomegaly and hepatic steatosis. Small volume abdominopelvic ascites. 2. Gallbladder is only minimally distended, however question  of wall enhancement and pericholecystic edema. This may be secondary to hepatic inflammation, however if there is clinical concern for acute gallbladder pathology, recommend right upper quadrant ultrasound. 3. Fat stranding about the duodenum appears contiguous with the gallbladder fossa. Duodenitis is considered. 4. Subcutaneous soft tissue edema and fat stranding about the umbilicus without focal fluid collection. Aortic Atherosclerosis (ICD10-I70.0).  Electronically Signed   By: Keith Rake M.D.   On: 05/25/2019 20:51   NM Hepato W/EF  Result Date: 05/26/2019 CLINICAL DATA:  Abnormal ultrasound of gallbladder EXAM: NUCLEAR MEDICINE HEPATOBILIARY IMAGING WITH GALLBLADDER EF VIEWS: Anterior right quadrant RADIOPHARMACEUTICALS:  Five 4 mCi Tc-17m  Choletec IV COMPARISON:  Ultrasound right upper quadrant May 25, 2019 FINDINGS: Liver uptake of radiotracer is unremarkable. There is prompt visualization of gallbladder and small bowel, indicating patency of the cystic and common bile ducts. The patient consumed 8 ounces of Ensure orally with calculation of the computer generated ejection fraction of radiotracer from the gallbladder. The patient did not experience clinical symptoms with the oral Ensure consumption. The computer generated ejection fraction of radiotracer from the gallbladder is normal at 86%, normal greater than 33% using the oral agent. IMPRESSION: Study within normal limits. Electronically Signed   By: Lowella Grip III M.D.   On: 05/26/2019 12:20   US Abdomen Limited  Result Date: 05/25/2019 CLINICAL DATA:  Right upper quadrant abdominal pain. EXAM: ULTRASOUND ABDOMEN LIMITED RIGHT UPPER QUADRANT COMPARISON:  CT from the same day. FINDINGS: Gallbladder: The sonographic Percell Miller sign is positive. There is gallbladder wall thickening with the gallbladder wall measuring approximately 5 mm in thickness. There are no gallstones. There is some pericholecystic free fluid. Common bile duct:  Diameter: 5 mm Liver: Diffuse increased echogenicity with slightly heterogeneous liver. Appearance typically secondary to fatty infiltration. Fibrosis secondary consideration. No secondary findings of cirrhosis noted. No focal hepatic lesion or intrahepatic biliary duct dilatation. Portal vein is patent on color Doppler imaging with normal direction of blood flow towards the liver. Other: None. IMPRESSION: 1. Findings are equivocal for acute acalculous cholecystitis. There is some gallbladder wall thickening in the presence of a positive sonographic Murphy sign, however the gallbladder is not distended and there are no gallstones. Consider further evaluation with a HIDA scan as clinically indicated. 2. Hepatic steatosis. Electronically Signed   By: Constance Holster M.D.   On: 05/25/2019 22:08    Impression:  1.  Hematochezia.  Resolved.  No anemia. 2.  Elevated LFTs, suspect alcohol-mediated. 3.  Abdominal pain, improving.  No evidence of cholecystitis on HIDA; may have some duodenal inflammation. 4.  Alcohol abuse; one case of beer per day.  Plan:  1.  Patient wants to go home. 2.  As his bleeding has resolved, pain is improving and has no anemia, I feel this is reasonable. 3.  We have patient's phone number and will reach out to him to schedule a follow-up office visit with [place] in [facility]. 4.  I communicated these findings/recommendations with Dr. Cruzita Lederer. 5.  [facility] will sign-off; please call back with questions; thank you for the consultation.   LOS: 0 days   Brevon Dewald M  05/26/2019, 2:36 PM  Cell [phone] If no answer or after 5 PM call [phone]

## 2019-05-26 NOTE — Discharge Summary (Signed)
Physician Discharge Summary  Thomas Atkins DOB: 10-02-1975 DOA: 05/25/2019  PCP: Ladell Pier, MD  Admit date: 05/25/2019 Discharge date: 05/26/2019  Admitted From: home Disposition:  home  Recommendations for Outpatient Follow-up:  1. Follow up with Dr. Paulita Fujita with gastroenterology  Home Health: None Equipment/Devices: None  Discharge Condition: Stable CODE STATUS: Full code Diet recommendation: Regular  HPI: Per admitting MD, Thomas Atkins is a 44 y.o. male with medical history significant of ETOH abuse with remote history of withdrawal  , HTN , Anxiety  Presents to ed with abdominal pain x 3 weeks and intermittent bloody stools x 2 weeks. Patient states pain has progressively gotten worse and is located right upper quadrant and epigastric area. Notes he is able to tolerate po without emesis. He states has a history of hemorrhoids but he has not has bleeding like this in the past. Last episode was morning of presentation which he describes as maroon stools.  He notes no associated, near syncope, palpitations or chest pain,back pain, dysuria,or hematemesis but does complain of fatigue. He denies any weight loss and noted remote history of peptic ulcer disease, but states he has constant GERD symptoms for which he takes tums on a regular basis.   Hospital Course / Discharge diagnoses: Abdominal pain -CT scan on admission showed concern for cholecystitis, general surgery was consulted over the phone and recommended a HIDA scan.  HIDA scan was done and it was normal.  His abdominal pain improved.  He is able to tolerate a regular diet Lower GI bleed -Patient reports bright red stools, also reports a history of hemorrhoids but never bled this much.  Gastroenterology consulted, evaluated patient, and given stability of the hemoglobin and patient's insistece that he goes home this can be further worked up as an outpatient.  He was placed on a PPI Essential hypertension  -Blood pressure stable Alcohol abuse -patient counseled to quit  Discharge Instructions   Allergies as of 05/26/2019      Reactions   Bee Venom Anaphylaxis   Lactose Intolerance (gi) Diarrhea, Nausea Only, Other (See Comments)   Abdominal pain     Ultram [tramadol Hcl] Hives         Medication List    TAKE these medications   amLODipine 10 MG tablet Commonly known as: NORVASC Take 1 tablet (10 mg total) by mouth daily.   pantoprazole 40 MG tablet Commonly known as: Protonix Take 1 tablet (40 mg total) by mouth daily.   tamsulosin 0.4 MG Caps capsule Commonly known as: FLOMAX Take 1 capsule (0.4 mg total) by mouth daily.   traZODone 100 MG tablet Commonly known as: DESYREL Take 1.5 tablets (150 mg total) by mouth at bedtime as needed for sleep.   triamcinolone cream 0.1 % Commonly known as: KENALOG Apply 1 application topically 2 (two) times daily.      Follow-up Information    Arta Silence, MD. Schedule an appointment as soon as possible for a visit in 2 week(s).   Specialty: Gastroenterology Contact information: [address] [place] [address] [phone]           Consultations:  GI  Procedures/Studies  CT Abdomen Pelvis W Contrast  Result Date: 05/25/2019 CLINICAL DATA:  Abdominal pain. Patient reports blood in stool. EXAM: CT ABDOMEN AND PELVIS WITH CONTRAST TECHNIQUE: Multidetector CT imaging of the abdomen and pelvis was performed using the standard protocol following bolus administration of intravenous contrast. CONTRAST:  166mL OMNIPAQUE IOHEXOL  300 MG/ML  SOLN COMPARISON:  None. FINDINGS: Lower chest: Lung bases are clear. Trace pericardial effusion. Liver is enlarged spanning scratch Hepatobiliary: Liver is enlarged spanning greater than 22 cm cranial caudal. Diffuse low density consistent with steatosis. Geographic fatty deposition in the periphery. No evidence of focal hepatic lesion. Gallbladder is only minimally  distended, however question of wall enhancement and pericholecystic edema. No calcified gallstone. No biliary dilatation. Small amount of perihepatic ascites and mild periportal edema. Pancreas: No ductal dilatation or inflammation. Spleen: Normal in size without focal abnormality. Splenule centrally. Adrenals/Urinary Tract: Normal adrenal glands. No hydronephrosis or perinephric edema. Homogeneous renal enhancement with symmetric excretion on delayed phase imaging. Tiny hypodensity arising from the lower right kidney is too small to characterize, likely small cyst. Urinary bladder is physiologically distended without wall thickening. Stomach/Bowel: Ingested material in the stomach. Mild fat stranding adjacent to the duodenum which appears contiguous with the pericholecystic stranding. No definite duodenal wall thickening. No small bowel obstruction. No evidence of small bowel inflammation. Small volume of colonic stool. No colonic wall thickening or inflammation. Normal appendix. Vascular/Lymphatic: Abdominal aorta is normal in caliber. Mild aortic atherosclerosis. The portal vein is patent. Few prominent reactive appearing inguinal lymph nodes. Reproductive: Prostate is unremarkable. Other: Small volume abdominopelvic ascites. Perihepatic free fluid with ascites tracking in the mesentery, both pericolic gutters, and into the pelvis. No free air. No evidence of focal abscess. Minimal subcutaneous soft tissue edema about the umbilicus without focal fluid collection. Musculoskeletal: There are no acute or suspicious osseous abnormalities. IMPRESSION: 1. Hepatomegaly and hepatic steatosis. Small volume abdominopelvic ascites. 2. Gallbladder is only minimally distended, however question of wall enhancement and pericholecystic edema. This may be secondary to hepatic inflammation, however if there is clinical concern for acute gallbladder pathology, recommend right upper quadrant ultrasound. 3. Fat stranding about the  duodenum appears contiguous with the gallbladder fossa. Duodenitis is considered. 4. Subcutaneous soft tissue edema and fat stranding about the umbilicus without focal fluid collection. Aortic Atherosclerosis (ICD10-I70.0). Electronically Signed   By: Narda Rutherford M.D.   On: 05/25/2019 20:51   NM Hepato W/EF  Result Date: 05/26/2019 CLINICAL DATA:  Abnormal ultrasound of gallbladder EXAM: NUCLEAR MEDICINE HEPATOBILIARY IMAGING WITH GALLBLADDER EF VIEWS: Anterior right quadrant RADIOPHARMACEUTICALS:  Five 4 mCi Tc-19m  Choletec IV COMPARISON:  Ultrasound right upper quadrant May 25, 2019 FINDINGS: Liver uptake of radiotracer is unremarkable. There is prompt visualization of gallbladder and small bowel, indicating patency of the cystic and common bile ducts. The patient consumed 8 ounces of Ensure orally with calculation of the computer generated ejection fraction of radiotracer from the gallbladder. The patient did not experience clinical symptoms with the oral Ensure consumption. The computer generated ejection fraction of radiotracer from the gallbladder is normal at 86%, normal greater than 33% using the oral agent. IMPRESSION: Study within normal limits. Electronically Signed   By: Bretta Bang III M.D.   On: 05/26/2019 12:20   US Abdomen Limited  Result Date: 05/25/2019 CLINICAL DATA:  Right upper quadrant abdominal pain. EXAM: ULTRASOUND ABDOMEN LIMITED RIGHT UPPER QUADRANT COMPARISON:  CT from the same day. FINDINGS: Gallbladder: The sonographic Eulah Pont sign is positive. There is gallbladder wall thickening with the gallbladder wall measuring approximately 5 mm in thickness. There are no gallstones. There is some pericholecystic free fluid. Common bile duct: Diameter: 5 mm Liver: Diffuse increased echogenicity with slightly heterogeneous liver. Appearance typically secondary to fatty infiltration. Fibrosis secondary consideration. No secondary findings of cirrhosis noted. No focal hepatic  lesion or intrahepatic biliary duct dilatation. Portal vein is patent on color Doppler imaging with normal direction of blood flow towards the liver. Other: None. IMPRESSION: 1. Findings are equivocal for acute acalculous cholecystitis. There is some gallbladder wall thickening in the presence of a positive sonographic Murphy sign, however the gallbladder is not distended and there are no gallstones. Consider further evaluation with a HIDA scan as clinically indicated. 2. Hepatic steatosis. Electronically Signed   By: Katherine Mantle M.D.   On: 05/25/2019 22:08      Subjective: - no chest pain, shortness of breath, no abdominal pain, nausea or vomiting.   Discharge Exam: BP 90/65 (BP Location: Right Arm)   Pulse 80   Temp 98.2 F (36.8 C) (Oral)   Resp 16   Ht 6\' 1"  (1.854 m)   Wt 93.6 kg   SpO2 93%   BMI 27.22 kg/m   General: Pt is alert, awake, not in acute distress Cardiovascular: RRR, S1/S2 +, no rubs, no gallops Respiratory: CTA bilaterally, no wheezing, no rhonchi Abdominal: Soft, NT, ND, bowel sounds + Extremities: no edema, no cyanosis    The results of significant diagnostics from this hospitalization (including imaging, microbiology, ancillary and laboratory) are listed below for reference.     Microbiology: Recent Results (from the past 240 hour(s))  SARS CORONAVIRUS 2 (TAT 6-24 HRS) Nasopharyngeal Nasopharyngeal Swab     Status: None   Collection Time: 05/26/19 12:10 AM   Specimen: Nasopharyngeal Swab  Result Value Ref Range Status   SARS Coronavirus 2 NEGATIVE NEGATIVE Final    Comment: (NOTE) SARS-CoV-2 target nucleic acids are NOT DETECTED. The SARS-CoV-2 RNA is generally detectable in upper and lower respiratory specimens during the acute phase of infection. Negative results do not preclude SARS-CoV-2 infection, do not rule out co-infections with other pathogens, and should not be used as the sole basis for treatment or other patient management  decisions. Negative results must be combined with clinical observations, patient history, and epidemiological information. The expected result is Negative. Fact Sheet for Patients: 05/28/19 Fact Sheet for Healthcare Providers: HairSlick.no This test is not yet approved or cleared by the quierodirigir.com FDA and  has been authorized for detection and/or diagnosis of SARS-CoV-2 by FDA under an Emergency Use Authorization (EUA). This EUA will remain  in effect (meaning this test can be used) for the duration of the COVID-19 declaration under Section 56 4(b)(1) of the Act, 21 U.S.C. section 360bbb-3(b)(1), unless the authorization is terminated or revoked sooner. Performed at [facility], [address] [place], [place]      Labs: Basic Metabolic Panel: Recent Labs  Lab 05/25/19 1705  NA 137  K 4.0  CL 103  CO2 19*  GLUCOSE 99  BUN 5*  CREATININE 0.80  CALCIUM 9.3   Liver Function Tests: Recent Labs  Lab 05/25/19 1705  AST 119*  ALT 72*  ALKPHOS 96  BILITOT 0.9  PROT 8.1  ALBUMIN 3.6   CBC: Recent Labs  Lab 05/25/19 1705 05/26/19 0721 05/26/19 1327  WBC 8.0 6.0  --   NEUTROABS  --  3.4  --   HGB 15.2 14.5 13.8  HCT 45.0 43.1  --   MCV 94.7 95.6  --   PLT 229 216  --    CBG: No results for input(s): GLUCAP in the last 168 hours. Hgb A1c Recent Labs    05/26/19 0721  HGBA1C 5.9*   Lipid Profile No results for input(s): CHOL, HDL,  LDLCALC, TRIG, CHOLHDL, LDLDIRECT in the last 72 hours. Thyroid function studies Recent Labs    05/26/19 0721  TSH 10.668*   Urinalysis    Component Value Date/Time   COLORURINE YELLOW 05/25/2019 1755   APPEARANCEUR CLEAR 05/25/2019 1755   LABSPEC 1.006 05/25/2019 1755   PHURINE 5.0 05/25/2019 1755   GLUCOSEU NEGATIVE 05/25/2019 1755   HGBUR NEGATIVE 05/25/2019 1755   BILIRUBINUR NEGATIVE 05/25/2019 1755   KETONESUR NEGATIVE 05/25/2019  1755   PROTEINUR NEGATIVE 05/25/2019 1755   UROBILINOGEN 0.2 02/19/2013 1819   NITRITE NEGATIVE 05/25/2019 1755   LEUKOCYTESUR NEGATIVE 05/25/2019 1755    FURTHER DISCHARGE INSTRUCTIONS:   Get Medicines reviewed and adjusted: Please take all your medications with you for your next visit with your Primary MD   Laboratory/radiological data: Please request your Primary MD to go over all hospital tests and procedure/radiological results at the follow up, please ask your Primary MD to get all Hospital records sent to his/her office.   In some cases, they will be blood work, cultures and biopsy results pending at the time of your discharge. Please request that your primary care M.D. goes through all the records of your hospital data and follows up on these results.   Also Note the following: If you experience worsening of your admission symptoms, develop shortness of breath, life threatening emergency, suicidal or homicidal thoughts you must seek medical attention immediately by calling 911 or calling your MD immediately  if symptoms less severe.   You must read complete instructions/literature along with all the possible adverse reactions/side effects for all the Medicines you take and that have been prescribed to you. Take any new Medicines after you have completely understood and accpet all the possible adverse reactions/side effects.    Do not drive when taking Pain medications or sleeping medications (Benzodaizepines)   Do not take more than prescribed Pain, Sleep and Anxiety Medications. It is not advisable to combine anxiety,sleep and pain medications without talking with your primary care practitioner   Special Instructions: If you have smoked or chewed Tobacco  in the last 2 yrs please stop smoking, stop any regular Alcohol  and or any Recreational drug use.   Wear Seat belts while driving.   Please note: You were cared for by a hospitalist during your hospital stay. Once you are  discharged, your primary care physician will handle any further medical issues. Please note that NO REFILLS for any discharge medications will be authorized once you are discharged, as it is imperative that you return to your primary care physician (or establish a relationship with a primary care physician if you do not have one) for your post hospital discharge needs so that they can reassess your need for medications and monitor your lab values.  Time coordinating discharge: 40 minutes  SIGNED:  Pamella Pert, MD, PhD 05/26/2019, 3:13 PM

## 2019-05-26 NOTE — Progress Notes (Signed)
Patient seen and examined this morning, admitted overnight.  H&P reviewed and agree with assessment and plan.  In brief, this is a 44 year old male with alcohol abuse, drinks up to 24 beers a day, history of withdrawal seizures, hypertension, anxiety, came in with abdominal pain for 3 weeks and intermittent bloody stool for the past weeks.  Abdominal pain is in the right upper quadrant and epigastric area.  No vomiting.   Abdominal pain -CT scan on admission showed concern for cholecystitis, HIDA scan ordered.  Admitting MD discussed with surgery, if HIDA scan positive will do a formal consult  Lower GI bleed -Patient reports bright red/maroon stools, hemoglobin is remarkably stable.  GI consulted, discussed with Dr. Dulce Sellar, appreciate input  Essential hypertension -Blood pressure stable  Alcohol abuse -Continue CIWA, no significant withdrawals this morning and says that Ativan really helped   Kirsta Probert M. Elvera Lennox, MD, PhD [facility]  Between 7 am - 7 pm I am available, please contact me via Amion or Securechat Between 7 pm - 7 am I am not available, please contact night coverage MD/APP via Amion

## 2019-05-26 NOTE — TOC Initial Note (Signed)
Transition of Care [facility]) - Initial/Assessment Note    Patient Details  Name: Thomas Atkins MRN: [id number] Date of Birth: 04-07-1975  [facility] CM/SW Contact:    Beckie Busing, RN Phone Number: 05/26/2019, 12:23 PM  Clinical Narrative:   CM consulted to provides patient with resources for ETOH abuse. CM provided patient with a list of resources. Patient states that he does not need the list. Patient verified that he is active with [facility] and that his medications are affordable because he only has to pay $4 for each script. Patient denies any transportation issues and states that he is able to stop drinking long enough to catch the city bus to keep his appointments. Patient confirms that he does have a support system in place and has no needs at this time. Will continue to follow for any further needs.                Expected Discharge Plan: Home/Self Care Barriers to Discharge: Continued Medical Work up   Patient Goals and CMS Choice Patient states their goals for this hospitalization and ongoing recovery are:: "To get out of here"      Expected Discharge Plan and Services Expected Discharge Plan: Home/Self Care   Discharge Planning Services: CM Consult   Living arrangements for the past 2 months: Single Family Home(lives in single family trailer)                                      Prior Living Arrangements/Services Living arrangements for the past 2 months: Single Family Home(lives in single family trailer) Lives with:: Self Patient language and need for interpreter reviewed:: Yes Do you feel safe going back to the place where you live?: Yes      Need for Family Participation in Patient Care: No (Comment) Care giver support system in place?: Yes (comment)   Criminal Activity/Legal Involvement Pertinent to Current Situation/Hospitalization: No - Comment as needed  Activities of Daily Living Home Assistive Devices/Equipment:  None ADL Screening (condition at time of admission) Patient's cognitive ability adequate to safely complete daily activities?: Yes Is the patient deaf or have difficulty hearing?: No Does the patient have difficulty seeing, even when wearing glasses/contacts?: No Does the patient have difficulty concentrating, remembering, or making decisions?: No Patient able to express need for assistance with ADLs?: No Does the patient have difficulty dressing or bathing?: No Independently performs ADLs?: Yes (appropriate for developmental age) Does the patient have difficulty walking or climbing stairs?: No Weakness of Legs: None Weakness of Arms/Hands: None  Permission Sought/Granted                  Emotional Assessment Appearance:: Appears stated age Attitude/Demeanor/Rapport: Complaining Affect (typically observed): Frustrated Orientation: : Oriented to Self, Oriented to Place, Oriented to  Time, Oriented to Situation Alcohol / Substance Use: Alcohol Use(Resource handout provided) Psych Involvement: No (comment)  Admission diagnosis:  GI bleed [K92.2] Abdominal pain, unspecified abdominal location [R10.9] Patient Active Problem List   Diagnosis Date Noted  . GI bleed 05/26/2019  . History of hypertension 07/31/2017  . Alcohol use disorder, moderate, dependence (HCC) 07/31/2017  . Tobacco dependence 07/31/2017  . Alcohol-induced insomnia (HCC) 07/31/2017  . Major depressive disorder, recurrent severe without psychotic features (HCC) 10/10/2015  . Alcohol abuse with alcohol-induced mood disorder (HCC) 05/31/2015   PCP:  [name], MD Pharmacy:  [place], [place] [address] Phone: [phone] Fax: [phone]  [place], [address] [place] [address] Phone: [phone] Fax: [place], [place] [address] [place] [address] Phone: [phone] Fax: [phone]     Social Determinants of Health (SDOH) Interventions    Readmission Risk Interventions No flowsheet data found.

## 2019-05-26 NOTE — Discharge Instructions (Signed)
Follow with Ladell Pier, MD in 5-7 days Follow up with Gastroenterology in 2-3 weeks  Please quit drinking alcohol  Please get a complete blood count and chemistry panel checked by your Primary MD at your next visit, and again as instructed by your Primary MD. Please get your medications reviewed and adjusted by your Primary MD.  Please request your Primary MD to go over all Hospital Tests and Procedure/Radiological results at the follow up, please get all Hospital records sent to your Prim MD by signing hospital release before you go home.  In some cases, there will be blood work, cultures and biopsy results pending at the time of your discharge. Please request that your primary care M.D. goes through all the records of your hospital data and follows up on these results.  If you had Pneumonia of Lung problems at the Hospital: Please get a 2 view Chest X ray done in 6-8 weeks after hospital discharge or sooner if instructed by your Primary MD.  If you have Congestive Heart Failure: Please call your Cardiologist or Primary MD anytime you have any of the following symptoms:  1) 3 pound weight gain in 24 hours or 5 pounds in 1 week  2) shortness of breath, with or without a dry hacking cough  3) swelling in the hands, feet or stomach  4) if you have to sleep on extra pillows at night in order to breathe  Follow cardiac low salt diet and 1.5 lit/day fluid restriction.  If you have diabetes Accuchecks 4 times/day, Once in AM empty stomach and then before each meal. Log in all results and show them to your primary doctor at your next visit. If any glucose reading is under 80 or above 300 call your primary MD immediately.  If you have Seizure/Convulsions/Epilepsy: Please do not drive, operate heavy machinery, participate in activities at heights or participate in high speed sports until you have seen by Primary MD or a Neurologist and advised to do so again. Per [facility]  statutes, patients with seizures are not allowed to drive until they have been seizure-free for six months.  Use caution when using heavy equipment or power tools. Avoid working on ladders or at heights. Take showers instead of baths. Ensure the water temperature is not too high on the home water heater. Do not go swimming alone. Do not lock yourself in a room alone (i.e. bathroom). When caring for infants or small children, sit down when holding, feeding, or changing them to minimize risk of injury to the child in the event you have a seizure. Maintain good sleep hygiene. Avoid alcohol.   If you had Gastrointestinal Bleeding: Please ask your Primary MD to check a complete blood count within one week of discharge or at your next visit. Your endoscopic/colonoscopic biopsies that are pending at the time of discharge, will also need to followed by your Primary MD.  Get Medicines reviewed and adjusted. Please take all your medications with you for your next visit with your Primary MD  Please request your Primary MD to go over all hospital tests and procedure/radiological results at the follow up, please ask your Primary MD to get all Hospital records sent to his/her office.  If you experience worsening of your admission symptoms, develop shortness of breath, life threatening emergency, suicidal or homicidal thoughts you must seek medical attention immediately by calling 911 or calling your MD immediately  if symptoms less severe.  You must read complete instructions/literature along  with all the possible adverse reactions/side effects for all the Medicines you take and that have been prescribed to you. Take any new Medicines after you have completely understood and accpet all the possible adverse reactions/side effects.   Do not drive or operate heavy machinery when taking Pain medications.   Do not take more than prescribed Pain, Sleep and Anxiety Medications  Special Instructions: If you have smoked  or chewed Tobacco  in the last 2 yrs please stop smoking, stop any regular Alcohol  and or any Recreational drug use.  Wear Seat belts while driving.  Please note You were cared for by a hospitalist during your hospital stay. If you have any questions about your discharge medications or the care you received while you were in the hospital after you are discharged, you can call the unit and asked to speak with the hospitalist on call if the hospitalist that took care of you is not available. Once you are discharged, your primary care physician will handle any further medical issues. Please note that NO REFILLS for any discharge medications will be authorized once you are discharged, as it is imperative that you return to your primary care physician (or establish a relationship with a primary care physician if you do not have one) for your aftercare needs so that they can reassess your need for medications and monitor your lab values.  You can reach the hospitalist office at phone [phone] or fax [phone]   If you do not have a primary care physician, you can call [phone] for a physician referral.  Activity: As tolerated with Full fall precautions use walker/cane & assistance as needed    Diet: regular  Disposition Home

## 2019-05-26 NOTE — TOC Transition Note (Signed)
Transition of Care [facility]) - CM/SW Discharge Note   Patient Details  Name: Thomas Atkins MRN: [id number] Date of Birth: [birth year]  [facility] CM/SW Contact:  Beckie Busing, RN Phone Number: [phone] 05/26/2019, 3:25 PM   Clinical Narrative: Patient discharged home self care. Patient can get scripts filled at [facility].       Final next level of care: Home/Self Care Barriers to Discharge: No Barriers Identified   Patient Goals and CMS Choice Patient states their goals for this hospitalization and ongoing recovery are:: "To get out of here"      Discharge Placement                       Discharge Plan and Services   Discharge Planning Services: CM Consult                                 Social Determinants of Health (SDOH) Interventions     Readmission Risk Interventions No flowsheet data found.

## 2019-05-26 NOTE — H&P (Signed)
History and Physical    Thomas Atkins XHB:[id number] DOB: 1975-10-02 DOA: 05/25/2019  PCP: [name], MD  Patient coming from: home  I have personally briefly reviewed patient's old medical records in [facility] Link  Chief Complaint:  Abdominal pain and blood in stool   HPI: Thomas Atkins is a 44 y.o. male with medical history significant of ETOH abuse with remote history of withdrawal  , HTN , Anxiety  Presents to ed with abdominal pain x 3 weeks and intermittent bloody stools x 2 weeks. Patient states pain has progressively gotten worse and is located right upper quadrant and epigastric area. Notes he is able to tolerate po without emesis. He states has a history of hemorrhoids but he has not has bleeding like this in the past. Last episode was morning of presentation which he describes as maroon stools.  He notes no associated, near syncope, palpitations or chest pain, back pain, dysuria,or hematemesis but does complain of fatigue. He denies any weight loss and noted remote history of peptic ulcer disease, but states he has constant GERD symptoms for which he takes tums on a regular basis.    ED Course:  Afeb, bp 118/84, rr 16, sat 95% on ra  Fob :+ dark  stool Review of Systems: As per HPI otherwise 10 point review of systems negative.     Past Medical History:  Diagnosis Date  . Alcoholism (HCC)   . Anxiety   . Depression   . Seizure disorder (HCC)   . Syncope     Past Surgical History:  Procedure Laterality Date  . SURGERY SCROTAL / TESTICULAR       reports that he has been smoking cigarettes. He has a 30.00 pack-year smoking history. He has never used smokeless tobacco. He reports current alcohol use of about 24.0 standard drinks of alcohol per week. He reports that he does not use drugs.  Allergies  Allergen Reactions  . Bee Venom Anaphylaxis  . Lactose Intolerance (Gi) Diarrhea, Nausea Only and Other (See Comments)    Abdominal pain    . Ultram [Tramadol  Hcl] Hives         Family History  Problem Relation Age of Onset  . Heart disease Paternal Grandmother   . Bipolar disorder Father     Prior to Admission medications   Medication Sig Start Date End Date Taking? Authorizing Provider  amLODipine (NORVASC) 10 MG tablet Take 1 tablet (10 mg total) by mouth daily. 04/07/19  Yes [name], MD  tamsulosin (FLOMAX) 0.4 MG CAPS capsule Take 1 capsule (0.4 mg total) by mouth daily. 04/07/19  Yes [name], MD  traZODone (DESYREL) 100 MG tablet Take 1.5 tablets (150 mg total) by mouth at bedtime as needed for sleep. 04/07/19  Yes [name], MD  triamcinolone cream (KENALOG) 0.1 % Apply 1 application topically 2 (two) times daily. Patient not taking: Reported on 05/25/2019 09/01/18   [name], MD    Physical Exam: Vitals:   05/25/19 2115 05/25/19 2232 05/25/19 2315 05/26/19 0000  BP: 125/87 118/84 123/85   Pulse: 89 90 91   Resp: 16  16 16   Temp:      TempSrc:      SpO2: 95%  93%   Weight:      Height:        Constitutional: NAD, calm, comfortable Vitals:   05/25/19 2115 05/25/19 2232 05/25/19 2315 05/26/19 0000  BP: 125/87 118/84 123/85  Pulse: 89 90 91   Resp: 16  16 16   Temp:      TempSrc:      SpO2: 95%  93%   Weight:      Height:       Eyes: PERRL, lids and conjunctivae normal ENMT: Mucous membranes are moist. Posterior pharynx clear of any exudate or lesions.Normal dentition.  Neck: normal, supple, no masses, no thyromegaly Respiratory: clear to auscultation bilaterally, no wheezing, no crackles. Normal respiratory effort. No accessory muscle use.  Cardiovascular: Regular rate and rhythm, no murmurs / rubs / gallops. No extremity edema. 2+ pedal pulses. No carotid bruits.  Abdomen: +right upper quad tenderness, no masses palpated. No hepatosplenomegaly. Bowel sounds positive. Voluntary guarding, +distention Musculoskeletal: no clubbing / cyanosis. No joint deformity upper and lower  extremities. Good ROM, no contractures. Normal muscle tone.  Skin: no rashes, lesions, ulcers. No induration Neurologic: CN 2-12 grossly intact. Sensation intact, DTR normal. Strength 5/5 in all 4.  Psychiatric: Normal judgment and insight. Alert and oriented x 3. Normal mood.    Labs on Admission: I have personally reviewed following labs and imaging studies  CBC: Recent Labs  Lab 05/25/19 1705  WBC 8.0  HGB 15.2  HCT 45.0  MCV 94.7  PLT 229   Basic Metabolic Panel: Recent Labs  Lab 05/25/19 1705  NA 137  K 4.0  CL 103  CO2 19*  GLUCOSE 99  BUN 5*  CREATININE 0.80  CALCIUM 9.3   GFR: Estimated Creatinine Clearance: 131.8 mL/min (by C-G formula based on SCr of 0.8 mg/dL). Liver Function Tests: Recent Labs  Lab 05/25/19 1705  AST 119*  ALT 72*  ALKPHOS 96  BILITOT 0.9  PROT 8.1  ALBUMIN 3.6   Recent Labs  Lab 05/25/19 1705  LIPASE 28   No results for input(s): AMMONIA in the last 168 hours. Coagulation Profile: No results for input(s): INR, PROTIME in the last 168 hours. Cardiac Enzymes: No results for input(s): CKTOTAL, CKMB, CKMBINDEX, TROPONINI in the last 168 hours. BNP (last 3 results) No results for input(s): PROBNP in the last 8760 hours. HbA1C: No results for input(s): HGBA1C in the last 72 hours. CBG: No results for input(s): GLUCAP in the last 168 hours. Lipid Profile: No results for input(s): CHOL, HDL, LDLCALC, TRIG, CHOLHDL, LDLDIRECT in the last 72 hours. Thyroid Function Tests: No results for input(s): TSH, T4TOTAL, FREET4, T3FREE, THYROIDAB in the last 72 hours. Anemia Panel: No results for input(s): VITAMINB12, FOLATE, FERRITIN, TIBC, IRON, RETICCTPCT in the last 72 hours. Urine analysis:    Component Value Date/Time   COLORURINE YELLOW 05/25/2019 1755   APPEARANCEUR CLEAR 05/25/2019 1755   LABSPEC 1.006 05/25/2019 1755   PHURINE 5.0 05/25/2019 1755   GLUCOSEU NEGATIVE 05/25/2019 1755   HGBUR NEGATIVE 05/25/2019 1755    BILIRUBINUR NEGATIVE 05/25/2019 1755   KETONESUR NEGATIVE 05/25/2019 1755   PROTEINUR NEGATIVE 05/25/2019 1755   UROBILINOGEN 0.2 02/19/2013 1819   NITRITE NEGATIVE 05/25/2019 1755   LEUKOCYTESUR NEGATIVE 05/25/2019 1755    Radiological Exams on Admission: CT Abdomen Pelvis W Contrast  Result Date: 05/25/2019 CLINICAL DATA:  Abdominal pain. Patient reports blood in stool. EXAM: CT ABDOMEN AND PELVIS WITH CONTRAST TECHNIQUE: Multidetector CT imaging of the abdomen and pelvis was performed using the standard protocol following bolus administration of intravenous contrast. CONTRAST:  05/27/2019 OMNIPAQUE IOHEXOL 300 MG/ML  SOLN COMPARISON:  None. FINDINGS: Lower chest: Lung bases are clear. Trace pericardial effusion. Liver is enlarged spanning scratch Hepatobiliary: Liver is  enlarged spanning greater than 22 cm cranial caudal. Diffuse low density consistent with steatosis. Geographic fatty deposition in the periphery. No evidence of focal hepatic lesion. Gallbladder is only minimally distended, however question of wall enhancement and pericholecystic edema. No calcified gallstone. No biliary dilatation. Small amount of perihepatic ascites and mild periportal edema. Pancreas: No ductal dilatation or inflammation. Spleen: Normal in size without focal abnormality. Splenule centrally. Adrenals/Urinary Tract: Normal adrenal glands. No hydronephrosis or perinephric edema. Homogeneous renal enhancement with symmetric excretion on delayed phase imaging. Tiny hypodensity arising from the lower right kidney is too small to characterize, likely small cyst. Urinary bladder is physiologically distended without wall thickening. Stomach/Bowel: Ingested material in the stomach. Mild fat stranding adjacent to the duodenum which appears contiguous with the pericholecystic stranding. No definite duodenal wall thickening. No small bowel obstruction. No evidence of small bowel inflammation. Small volume of colonic stool. No colonic  wall thickening or inflammation. Normal appendix. Vascular/Lymphatic: Abdominal aorta is normal in caliber. Mild aortic atherosclerosis. The portal vein is patent. Few prominent reactive appearing inguinal lymph nodes. Reproductive: Prostate is unremarkable. Other: Small volume abdominopelvic ascites. Perihepatic free fluid with ascites tracking in the mesentery, both pericolic gutters, and into the pelvis. No free air. No evidence of focal abscess. Minimal subcutaneous soft tissue edema about the umbilicus without focal fluid collection. Musculoskeletal: There are no acute or suspicious osseous abnormalities. IMPRESSION: 1. Hepatomegaly and hepatic steatosis. Small volume abdominopelvic ascites. 2. Gallbladder is only minimally distended, however question of wall enhancement and pericholecystic edema. This may be secondary to hepatic inflammation, however if there is clinical concern for acute gallbladder pathology, recommend right upper quadrant ultrasound. 3. Fat stranding about the duodenum appears contiguous with the gallbladder fossa. Duodenitis is considered. 4. Subcutaneous soft tissue edema and fat stranding about the umbilicus without focal fluid collection. Aortic Atherosclerosis (ICD10-I70.0). Electronically Signed   By: Keith Rake M.D.   On: 05/25/2019 20:51   US Abdomen Limited  Result Date: 05/25/2019 CLINICAL DATA:  Right upper quadrant abdominal pain. EXAM: ULTRASOUND ABDOMEN LIMITED RIGHT UPPER QUADRANT COMPARISON:  CT from the same day. FINDINGS: Gallbladder: The sonographic Percell Miller sign is positive. There is gallbladder wall thickening with the gallbladder wall measuring approximately 5 mm in thickness. There are no gallstones. There is some pericholecystic free fluid. Common bile duct: Diameter: 5 mm Liver: Diffuse increased echogenicity with slightly heterogeneous liver. Appearance typically secondary to fatty infiltration. Fibrosis secondary consideration. No secondary findings of  cirrhosis noted. No focal hepatic lesion or intrahepatic biliary duct dilatation. Portal vein is patent on color Doppler imaging with normal direction of blood flow towards the liver. Other: None. IMPRESSION: 1. Findings are equivocal for acute acalculous cholecystitis. There is some gallbladder wall thickening in the presence of a positive sonographic Murphy sign, however the gallbladder is not distended and there are no gallstones. Consider further evaluation with a HIDA scan as clinically indicated. 2. Hepatic steatosis. Electronically Signed   By: Constance Holster M.D.   On: 05/25/2019 22:08    EKG: Independently reviewed  Assessment/Plan  GI Bleed  -presumed upper ? etoh gastritis /pud  -ppi bid  -cycle h/h  - gi consult to be called in am  - npo   ETOH abuse -place on ciwa  - patient has remote hx of ETOH w/d seizure   HTN  -blood pressure stable  -due to bleeding will hold HTN meds  -resume as able   ? Acute cholecystitis  - will place on antibiotics  -HIDA  scan per recommendation of surgery  - if HIDA scan + , surgery agrees to further evaluation at that time  -( case discussed with DR Cliffton Asters )   Anxiety/ Depression  -no si or hi -continue home medication as able   FEN -electrolytes stable  Replete prn  -ivf    DVT prophylaxis: scd  Code Status: full   Family Communication: N/A Disposition Plan: 2-3 days  Consults called: DR Cliffton Asters to be called in am if HIDA scan +  GI consult to be called in am   Admission status:  Inpatient   Lurline Del MD [facility]  If 7PM-7AM, please contact night-coverage [email or web address] Password [email or web address]  05/26/2019, 2:05 AM

## 2019-05-27 ENCOUNTER — Telehealth: Payer: Self-pay

## 2019-05-27 LAB — HIV-1/2 AB - DIFFERENTIATION
HIV 1 Ab: POSITIVE
HIV 2 Ab: NEGATIVE

## 2019-05-27 NOTE — Telephone Encounter (Signed)
Transition Care Management Follow-up Telephone Call  Date of discharge and from where:05/26/2019 from [facility]  How have you been since you were released from the hospital? Pt stated much better Any questions or concerns? None verbalized   Items Reviewed:  Did the pt receive and understand the discharge instructions provided?YES PT AWARE  Medications obtained and verified? MEDS WERE NOT OBTAINED, WILL PICK UP TODAY  / Verified   Any new allergies since your discharge?   None   Dietary orders reviewed?  Yes   Do you have support at home? Yes   Functional Questionnaire: (I = Independent and D = Dependent) ADLs: I   Bathing/Dressing-I     Meal Prep- I  Eating- I  Maintaining continence- I  Transferring/Ambulation- I  Managing Meds- I  Follow up appointments reviewed:   PCP Hospital f/u appt confirmed?  Scheduled to see Dr Gwenyth Bender on 0 06/16/2019@ 10:50 am   Specialist Hospital f/u appt confirmed? NOT yet / Stated he will schedule today   Are transportation arrangements needed? no If their condition worsens, is the pt aware to call PCP or go to the Emergency Dept.?YES PT IS AWARE  Was the patient provided with contact information for the PCP's office or ED? YES Was to pt encouraged to call back with questions or concerns? YES pt was encouuraged to call office for any questions or concerns .

## 2019-05-27 NOTE — Progress Notes (Signed)
Notified by Santina Evans in lab of critical result.  Advised her that patient was discharged and that she should call the primary care Dr. Laural Benes at [facility].  Charge nurse also paged Dr. Lafe Garin and advised him of the critical value and he would follow up.

## 2019-05-28 ENCOUNTER — Telehealth: Payer: Self-pay | Admitting: Internal Medicine

## 2019-05-28 NOTE — Telephone Encounter (Signed)
Sending to [facility] for new positive HIV test

## 2019-05-29 ENCOUNTER — Telehealth: Payer: Self-pay | Admitting: Internal Medicine

## 2019-05-29 NOTE — Telephone Encounter (Signed)
PC placed to pt today to inform of the results of HIV test.  I got his VM and was unable to leave a VM message as mail box is full.  Will try again tomorrow.

## 2019-05-30 ENCOUNTER — Telehealth: Payer: Self-pay | Admitting: Internal Medicine

## 2019-05-30 NOTE — Telephone Encounter (Signed)
Faxed to [facility]. Thanks. Andree Coss, RN

## 2019-05-30 NOTE — Telephone Encounter (Signed)
PC placed to pt today to inform of positive HIV test.  VMB was full and I was unable to leave a message.   Will try him again one more time tomorrow then will probably send letter and contact [facility].

## 2019-05-31 ENCOUNTER — Telehealth: Payer: Self-pay | Admitting: Internal Medicine

## 2019-05-31 DIAGNOSIS — Z21 Asymptomatic human immunodeficiency virus [HIV] infection status: Secondary | ICD-10-CM

## 2019-05-31 NOTE — Telephone Encounter (Signed)
Phone call placed to patient today.  I have informed the patient that his HIV test was positive.  Patient states that he was not aware of the results.  He is currently not sexually active and states he has not been in a while.  I have recommended that he inform his partner or partners so that they can be tested.  Advised to avoid having sex without use of a condom.  Advised to avoid sharing things like needles, toothbrushes shaves.  I told him that I would refer him to an infectious disease specialist for further evaluation and management.  He will pick up the forms for the orange card and the cone discount card and fill those out.  He will keep his follow-up appointment with me on the eighth of next month.  Patient expressed understanding and all questions were answered.

## 2019-06-03 MED FILL — PANTOPRAZOLE SOD DR 40 MG T: 40 | 30 days supply | Qty: 30 | Fill #0

## 2019-06-07 ENCOUNTER — Other Ambulatory Visit: Payer: Self-pay | Admitting: *Deleted

## 2019-06-07 DIAGNOSIS — B20 Human immunodeficiency virus [HIV] disease: Secondary | ICD-10-CM

## 2019-06-07 DIAGNOSIS — Z79899 Other long term (current) drug therapy: Secondary | ICD-10-CM

## 2019-06-07 DIAGNOSIS — Z113 Encounter for screening for infections with a predominantly sexual mode of transmission: Secondary | ICD-10-CM

## 2019-06-07 NOTE — Progress Notes (Signed)
New patient orders placed. Andree Coss, RN

## 2019-06-08 ENCOUNTER — Ambulatory Visit: Payer: Self-pay

## 2019-06-08 ENCOUNTER — Other Ambulatory Visit: Payer: Self-pay

## 2019-06-08 ENCOUNTER — Encounter: Payer: Self-pay | Admitting: Family

## 2019-06-08 DIAGNOSIS — Z113 Encounter for screening for infections with a predominantly sexual mode of transmission: Secondary | ICD-10-CM

## 2019-06-08 DIAGNOSIS — B20 Human immunodeficiency virus [HIV] disease: Secondary | ICD-10-CM

## 2019-06-08 DIAGNOSIS — Z79899 Other long term (current) drug therapy: Secondary | ICD-10-CM

## 2019-06-09 LAB — T-HELPER CELL (CD4) - (RCID CLINIC ONLY)
CD4 % Helper T Cell: 48 % (ref 33–65)
CD4 T Cell Abs: 637 /uL (ref 400–1790)

## 2019-06-16 ENCOUNTER — Encounter: Payer: Self-pay | Admitting: Internal Medicine

## 2019-06-16 ENCOUNTER — Ambulatory Visit: Payer: Self-pay | Attending: Internal Medicine | Admitting: Internal Medicine

## 2019-06-16 ENCOUNTER — Other Ambulatory Visit: Payer: Self-pay

## 2019-06-16 VITALS — BP 118/77 | HR 89 | Temp 98.2°F | Resp 16 | Wt 206.8 lb

## 2019-06-16 DIAGNOSIS — Z21 Asymptomatic human immunodeficiency virus [HIV] infection status: Secondary | ICD-10-CM

## 2019-06-16 DIAGNOSIS — R234 Changes in skin texture: Secondary | ICD-10-CM

## 2019-06-16 DIAGNOSIS — B20 Human immunodeficiency virus [HIV] disease: Secondary | ICD-10-CM | POA: Insufficient documentation

## 2019-06-16 DIAGNOSIS — Z09 Encounter for follow-up examination after completed treatment for conditions other than malignant neoplasm: Secondary | ICD-10-CM

## 2019-06-16 DIAGNOSIS — F1010 Alcohol abuse, uncomplicated: Secondary | ICD-10-CM

## 2019-06-16 DIAGNOSIS — K5900 Constipation, unspecified: Secondary | ICD-10-CM

## 2019-06-16 DIAGNOSIS — K819 Cholecystitis, unspecified: Secondary | ICD-10-CM

## 2019-06-16 DIAGNOSIS — K625 Hemorrhage of anus and rectum: Secondary | ICD-10-CM

## 2019-06-16 MED ORDER — POLYETHYLENE GLYCOL 3350 17 GM/SCOOP PO POWD: 17.0000 g | Freq: Two times a day (BID) | ORAL | 1 refills | Status: DC | PRN

## 2019-06-16 MED ORDER — NYSTATIN-TRIAMCINOLONE 100000-0.1 UNIT/GM-% EX OINT: TOPICAL_OINTMENT | CUTANEOUS | 1 refills | Status: DC

## 2019-06-16 MED FILL — NYSTATIN-TRIAMCINOLONE OINT: 100000-0.1 | 7 days supply | Qty: 30 | Fill #0

## 2019-06-16 MED FILL — AMLODIPINE BESYLATE 10 MG T: 10 | 30 days supply | Qty: 30 | Fill #2

## 2019-06-16 MED FILL — POLYETHYLENE GLYCOL 3350 PO: 17 | 7 days supply | Qty: 238 | Fill #0

## 2019-06-16 NOTE — Patient Instructions (Signed)
Use the Mycolog cream on the skin close to the rectal area.  Do not put it inside the rectum. Tried sitting in warm water with a little Epson salt daily for 10 to 15 minutes. Keep your upcoming appointment with the gastroenterologist.  I will also refer you to a general surgeon regarding the gallbladder.

## 2019-06-16 NOTE — Progress Notes (Signed)
Patient ID: Thomas Atkins, male    DOB: 1975-06-30  MRN: [id number]  CC: Transition of care visit Date of admission: 3/17-18/2021 Date of phone call with case worker: 05/27/2019  Subjective: Thomas Atkins is a 44 y.o. male who presents for transition of care visit His concerns today include:  Patient with history ofHTN,alcohol use disorder,tob dep, anx/dep, ?seizure disorder.   Patient hospitalized with right-sided abdominal pain and intermittent bloody stools x2 weeks.  He also reported constant GERD symptoms for which she was taking Tums.  Lipase level was normal.  Blood count revealed no anemia and chemistry revealed elevation of liver enzymes with AST of 119 and ALT of 72.  Kidney function normal. CAT scan of the abdomen revealed hepatomegaly and hepatic steatosis with small abdominal pelvic ascites.  The gallbladder was minimally distended with question of pericholestatic edema.  There was subcutaneous soft tissue edema and fat stranding about the umbilicus without focal fluid collection.  Gallbladder ultrasound revealed findings equivocal for acute acalculous cholecystitis.  HIDA scan was negative.  Patient improved and was able to tolerate oral food so he was discharged home with plan for follow-up with gastroenterologist Dr. Paulita Fujita Patient had HIV testing that was positive.  Today: He reports that he is still having some pain on the right side of the abdomen.  There is no nausea or vomiting.  No fever.  No increased abdominal girth.  He is still noticing blood in the stools every day with some rectal pain.  Stools are hard.  He has to strain.  In the discharge summary from the hospital it was mentioned that patient had told him that he had hemorrhoids in the past.  However he tells me that he has not had any history of hemorrhoids.  He reports having anal intercourse more than 10 years ago with a male using some sex toys but no anal intercourse since then.  He has an appointment with Dr.  Paulita Fujita later this month.  In regards to the new diagnosis of HIV.  He has been plugged in already with ID clinic.  EtOH: Patient states that he drank some the other night.  He was vague and did not want to answer questions on how much is he currently drinking. Patient Active Problem List   Diagnosis Date Noted  . GI bleed 05/26/2019  . History of hypertension 07/31/2017  . Alcohol use disorder, moderate, dependence ([place]) 07/31/2017  . Tobacco dependence 07/31/2017  . Alcohol-induced insomnia ([place]) 07/31/2017  . Major depressive disorder, recurrent severe without psychotic features ([place]) 10/10/2015  . Alcohol abuse with alcohol-induced mood disorder ([place]) 05/31/2015     Current Outpatient Medications on File Prior to Visit  Medication Sig Dispense Refill  . amLODipine (NORVASC) 10 MG tablet Take 1 tablet (10 mg total) by mouth daily. 30 tablet 5  . pantoprazole (PROTONIX) 40 MG tablet Take 1 tablet (40 mg total) by mouth daily. 30 tablet 1  . tamsulosin (FLOMAX) 0.4 MG CAPS capsule Take 1 capsule (0.4 mg total) by mouth daily. 30 capsule 3  . traZODone (DESYREL) 100 MG tablet Take 1.5 tablets (150 mg total) by mouth at bedtime as needed for sleep. 45 tablet 4  . triamcinolone cream (KENALOG) 0.1 % Apply 1 application topically 2 (two) times daily. (Patient not taking: Reported on 05/25/2019) 30 g 0   No current facility-administered medications on file prior to visit.    Allergies  Allergen Reactions  . Bee Venom Anaphylaxis  . Lactose Intolerance (  Gi) Diarrhea, Nausea Only and Other (See Comments)    Abdominal pain    . Ultram [Tramadol Hcl] Hives         Social History   Socioeconomic History  . Marital status: Divorced    Spouse name: Not on file  . Number of children: 2  . Years of education: Not on file  . Highest education level: Not on file  Occupational History  . Occupation: Unemployed  Tobacco Use  . Smoking status: Current Every Day Smoker    Packs/day: 1.00      Years: 30.00    Pack years: 30.00    Types: Cigarettes  . Smokeless tobacco: Never Used  . Tobacco comment: Patient refused cessation materials  Substance and Sexual Activity  . Alcohol use: Yes    Alcohol/week: 24.0 standard drinks    Types: 24 Cans of beer per week    Comment: 320 oz daily  . Drug use: No    Comment: Denies drug use  . Sexual activity: Not on file  Other Topics Concern  . Not on file  Social History Narrative  . Not on file   Social Determinants of Health   Financial Resource Strain:   . Difficulty of Paying Living Expenses:   Food Insecurity:   . Worried About Programme researcher, broadcasting/film/video in the Last Year:   . Barista in the Last Year:   Transportation Needs:   . Freight forwarder (Medical):   Marland Kitchen Lack of Transportation (Non-Medical):   Physical Activity:   . Days of Exercise per Week:   . Minutes of Exercise per Session:   Stress:   . Feeling of Stress :   Social Connections:   . Frequency of Communication with Friends and Family:   . Frequency of Social Gatherings with Friends and Family:   . Attends Religious Services:   . Active Member of Clubs or Organizations:   . Attends Banker Meetings:   Marland Kitchen Marital Status:   Intimate Partner Violence:   . Fear of Current or Ex-Partner:   . Emotionally Abused:   Marland Kitchen Physically Abused:   . Sexually Abused:     Family History  Problem Relation Age of Onset  . Heart disease Paternal Grandmother   . Bipolar disorder Father     Past Surgical History:  Procedure Laterality Date  . SURGERY SCROTAL / TESTICULAR      ROS: Review of Systems Negative except as stated above  PHYSICAL EXAM: BP 118/77   Pulse 89   Temp 98.2 F (36.8 C)   Resp 16   Wt 206 lb 12.8 oz (93.8 kg)   SpO2 95%   BMI 27.28 kg/m   Physical Exam   General appearance - alert, well appearing, and in no distress Mental status -patient seems slightly intoxicated today  mouth - mucous membranes moist, pharynx  normal without lesions Chest - clear to auscultation, no wheezes, rales or rhonchi, symmetric air entry Heart - normal rate, regular rhythm, normal S1, S2, no murmurs, rubs, clicks or gallops Abdomen -soft, slightly distended, normal bowel sounds, mild right upper quadrant tenderness  rectal -patient noted to have peeling with mild erythema and fissuring between the gluteal fold that extends to the rectum.  No external hemorrhoids noted.  He has some cracking and fissuring at the 2:00 and 9 o'clock position of the rectum. Extremities -no lower extremity edema CMP Latest Ref Rng & Units 06/08/2019 05/25/2019 09/01/2018  Glucose 65 -  99 mg/dL 622(W) 99 85  BUN 7 - 25 mg/dL 10 5(L) 9  Creatinine 9.79 - 1.35 mg/dL 8.92 1.19 4.17  Sodium 135 - 146 mmol/L 136 137 137  Potassium 3.5 - 5.3 mmol/L 4.0 4.0 4.8  Chloride 98 - 110 mmol/L 103 103 102  CO2 20 - 32 mmol/L 24 19(L) 20  Calcium 8.6 - 10.3 mg/dL 8.6 9.3 9.9  Total Protein 6.1 - 8.1 g/dL 7.5 8.1 7.9  Total Bilirubin 0.2 - 1.2 mg/dL 1.1 0.9 0.5  Alkaline Phos 38 - 126 U/L - 96 73  AST 10 - 40 U/L 74(H) 119(H) 112(H)  ALT 9 - 46 U/L 50(H) 72(H) 135(H)   Lipid Panel     Component Value Date/Time   CHOL 167 06/08/2019 0925   TRIG 188 (H) 06/08/2019 0925   HDL 17 (L) 06/08/2019 0925   CHOLHDL 9.8 (H) 06/08/2019 0925   LDLCALC 120 (H) 06/08/2019 0925    CBC    Component Value Date/Time   WBC 5.2 06/08/2019 0925   RBC 4.48 06/08/2019 0925   HGB 14.1 06/08/2019 0925   HGB 15.7 09/01/2018 1146   HCT 42.8 06/08/2019 0925   HCT 46.2 09/01/2018 1146   PLT 244 06/08/2019 0925   PLT 262 09/01/2018 1146   MCV 95.5 06/08/2019 0925   MCV 96 09/01/2018 1146   MCH 31.5 06/08/2019 0925   MCHC 32.9 06/08/2019 0925   RDW 14.1 06/08/2019 0925   RDW 14.7 09/01/2018 1146   LYMPHSABS 1,321 06/08/2019 0925   MONOABS 0.6 05/26/2019 0721   EOSABS 130 06/08/2019 0925   BASOSABS 31 06/08/2019 0925    ASSESSMENT AND PLAN: 1. Hospital discharge  follow-up  2. Rectal bleeding 3. Fissure in skin Advised patient to try to keep the bowel movements soft and regular.  Recommend eating foods that are high in fiber.  Have given some MiraLAX to use as needed. -He does have some deep cracking of the skin between the gluteal fold and the rectal area that are tender.  May be contributing to rectal bleeding.  I have prescribed some Mycolog ointment.  Recommend sitz bath's.  Advised to abstain from anal intercourse until this heals - nystatin-triamcinolone ointment (MYCOLOG); Apply to affected area BID  Dispense: 30 g; Refill: 1  4. Acalculous cholecystitis -Patient to keep appointment with Dr. Dulce Sellar later this month.  However I wonder whether we should have him seen by a general surgeon also.  Referral submitted  5. Constipation, unspecified constipation type - polyethylene glycol powder (GLYCOLAX/MIRALAX) 17 GM/SCOOP powder; Take 17 g by mouth 2 (two) times daily as needed.  Dispense: 3350 g; Refill: 1  6. Asymptomatic HIV infection (HCC) Plugged into ID clinic  7. Alcohol use disorder, mild, abuse Encourage him to abstain or at least try to cut down significantly.    Patient was given the opportunity to ask questions.  Patient verbalized understanding of the plan and was able to repeat key elements of the plan.   No orders of the defined types were placed in this encounter.    Requested Prescriptions   Signed Prescriptions Disp Refills  . nystatin-triamcinolone ointment (MYCOLOG) 30 g 1    Sig: Apply to affected area BID  . polyethylene glycol powder (GLYCOLAX/MIRALAX) 17 GM/SCOOP powder 3350 g 1    Sig: Take 17 g by mouth 2 (two) times daily as needed.    Return in about 2 months (around 08/16/2019).  Thomas Blue, MD, FACP

## 2019-06-16 NOTE — Progress Notes (Signed)
Pt states his pain is coming from gallbladder

## 2019-06-21 LAB — LIPID PANEL
Cholesterol: 167 mg/dL (ref <200)
HDL: 17 mg/dL — ABNORMAL LOW (ref >=40)
LDL Cholesterol (Calc): 120 mg/dL (calc) — ABNORMAL HIGH
Non-HDL Cholesterol (Calc): 150 mg/dL (calc) — ABNORMAL HIGH (ref <130)
Total CHOL/HDL Ratio: 9.8 (calc) — ABNORMAL HIGH (ref <5.0)
Triglycerides: 188 mg/dL — ABNORMAL HIGH (ref <150)

## 2019-06-21 LAB — CBC WITH DIFFERENTIAL/PLATELET
Absolute Monocytes: 442 cells/uL (ref 200–950)
Basophils Absolute: 31 cells/uL (ref 0–200)
Basophils Relative: 0.6 %
Eosinophils Absolute: 130 cells/uL (ref 15–500)
Eosinophils Relative: 2.5 %
HCT: 42.8 % (ref 38.5–50.0)
Hemoglobin: 14.1 g/dL (ref 13.2–17.1)
Lymphs Abs: 1321 cells/uL (ref 850–3900)
MCH: 31.5 pg (ref 27.0–33.0)
MCHC: 32.9 g/dL (ref 32.0–36.0)
MCV: 95.5 fL (ref 80.0–100.0)
MPV: 10.3 fL (ref 7.5–12.5)
Monocytes Relative: 8.5 %
Neutro Abs: 3276 cells/uL (ref 1500–7800)
Neutrophils Relative %: 63 %
Platelets: 244 10*3/uL (ref 140–400)
RBC: 4.48 10*6/uL (ref 4.20–5.80)
RDW: 14.1 % (ref 11.0–15.0)
Total Lymphocyte: 25.4 %
WBC: 5.2 10*3/uL (ref 3.8–10.8)

## 2019-06-21 LAB — QUANTIFERON-TB GOLD PLUS
Mitogen-NIL: 7.39 IU/mL
NIL: 0.24 IU/mL
QuantiFERON-TB Gold Plus: NEGATIVE
TB1-NIL: 0.03 IU/mL
TB2-NIL: 0.01 IU/mL

## 2019-06-21 LAB — HIV-1 RNA ULTRAQUANT REFLEX TO GENTYP+
HIV 1 RNA Quant: 140000 copies/mL — ABNORMAL HIGH
HIV-1 RNA Quant, Log: 5.15 Log copies/mL — ABNORMAL HIGH

## 2019-06-21 LAB — COMPLETE METABOLIC PANEL WITH GFR
AG Ratio: 1 (calc) (ref 1.0–2.5)
ALT: 50 U/L — ABNORMAL HIGH (ref 9–46)
AST: 74 U/L — ABNORMAL HIGH (ref 10–40)
Albumin: 3.7 g/dL (ref 3.6–5.1)
Alkaline phosphatase (APISO): 119 U/L (ref 36–130)
BUN: 10 mg/dL (ref 7–25)
CO2: 24 mmol/L (ref 20–32)
Calcium: 8.6 mg/dL (ref 8.6–10.3)
Chloride: 103 mmol/L (ref 98–110)
Creat: 0.86 mg/dL (ref 0.60–1.35)
GFR, Est African American: 121 mL/min/{1.73_m2} (ref >=60)
GFR, Est Non African American: 105 mL/min/{1.73_m2} (ref >=60)
Globulin: 3.8 g/dL (calc) — ABNORMAL HIGH (ref 1.9–3.7)
Glucose, Bld: 103 mg/dL — ABNORMAL HIGH (ref 65–99)
Potassium: 4 mmol/L (ref 3.5–5.3)
Sodium: 136 mmol/L (ref 135–146)
Total Bilirubin: 1.1 mg/dL (ref 0.2–1.2)
Total Protein: 7.5 g/dL (ref 6.1–8.1)

## 2019-06-21 LAB — HEPATITIS C ANTIBODY
Hepatitis C Ab: NONREACTIVE
SIGNAL TO CUT-OFF: 0.12 (ref <1.00)

## 2019-06-21 LAB — HEPATITIS B SURFACE ANTIBODY,QUALITATIVE: Hep B S Ab: NONREACTIVE

## 2019-06-21 LAB — HEPATITIS B CORE ANTIBODY, TOTAL: Hep B Core Total Ab: NONREACTIVE

## 2019-06-21 LAB — HIV ANTIBODY (ROUTINE TESTING W REFLEX): HIV 1&2 Ab, 4th Generation: REACTIVE — AB

## 2019-06-21 LAB — HIV-1 GENOTYPE: HIV-1 Genotype: DETECTED — AB

## 2019-06-21 LAB — HIV-1/2 AB - DIFFERENTIATION
HIV-1 antibody: POSITIVE — AB
HIV-2 Ab: NEGATIVE

## 2019-06-21 LAB — HEPATITIS B SURFACE ANTIGEN: Hepatitis B Surface Ag: NONREACTIVE

## 2019-06-21 LAB — HLA B*5701: HLA-B*5701 w/rflx HLA-B High: NEGATIVE

## 2019-06-21 LAB — RPR: RPR Ser Ql: NONREACTIVE

## 2019-06-21 LAB — HEPATITIS A ANTIBODY, TOTAL: Hepatitis A AB,Total: NONREACTIVE

## 2019-06-28 ENCOUNTER — Ambulatory Visit: Payer: Self-pay

## 2019-06-30 ENCOUNTER — Other Ambulatory Visit (HOSPITAL_COMMUNITY): Payer: Self-pay | Admitting: Gastroenterology

## 2019-06-30 ENCOUNTER — Telehealth: Payer: Self-pay | Admitting: Pharmacy Technician

## 2019-06-30 DIAGNOSIS — R140 Abdominal distension (gaseous): Secondary | ICD-10-CM

## 2019-06-30 NOTE — Telephone Encounter (Addendum)
[facility] Patient Product/process development scientist completed.    The patient is uninsured and is ADAP approved.  Netty Starring. Dimas Aguas CPhT Specialty Pharmacy Patient [facility] Phone: [phone] Fax:  [phone]

## 2019-07-01 ENCOUNTER — Telehealth: Payer: Self-pay

## 2019-07-01 ENCOUNTER — Ambulatory Visit (HOSPITAL_COMMUNITY)
Admission: RE | Admit: 2019-07-01 | Discharge: 2019-07-01 | Disposition: A | Discharge status: Home / Self Care | Payer: Self-pay | Source: Ambulatory Visit | Attending: Gastroenterology | Admitting: Gastroenterology

## 2019-07-01 ENCOUNTER — Other Ambulatory Visit: Payer: Self-pay

## 2019-07-01 DIAGNOSIS — R188 Other ascites: Secondary | ICD-10-CM | POA: Insufficient documentation

## 2019-07-01 DIAGNOSIS — R140 Abdominal distension (gaseous): Secondary | ICD-10-CM | POA: Insufficient documentation

## 2019-07-01 HISTORY — PX: IR PARACENTESIS: IMG2679

## 2019-07-01 LAB — BODY FLUID CELL COUNT WITH DIFFERENTIAL
Eos, Fluid: 0 %
Lymphs, Fluid: 39 %
Monocyte-Macrophage-Serous Fluid: 61 % (ref 50–90)
Neutrophil Count, Fluid: 0 % (ref 0–25)
Total Nucleated Cell Count, Fluid: 500 cu mm (ref 0–1000)

## 2019-07-01 LAB — ALBUMIN, PLEURAL OR PERITONEAL FLUID: Albumin, Fluid: 1.6 g/dL

## 2019-07-01 LAB — PROTEIN, PLEURAL OR PERITONEAL FLUID: Total protein, fluid: 3.7 g/dL

## 2019-07-01 MED ORDER — LIDOCAINE HCL 1 % IJ SOLN
INTRAMUSCULAR | Status: DC | PRN
  Administered 2019-07-01: 5 mL

## 2019-07-01 MED ORDER — LIDOCAINE HCL 1 % IJ SOLN
INTRAMUSCULAR | Status: AC
  Filled 2019-07-01: qty 20

## 2019-07-01 NOTE — Telephone Encounter (Signed)
COVID-19 Pre-Screening Questions:07/01/19  Do you currently have a fever (>100 F), chills or unexplained body aches?NO  Are you currently experiencing new cough, shortness of breath, sore throat, runny nose? NO .  Have you recently travelled outside the state of [place] in the last 14 days? NO  .  Have you been in contact with someone that is currently pending confirmation of Covid19 testing or has been confirmed to have the Covid19 virus?  NO **If the patient answers NO to ALL questions -  advise the patient to please call the clinic before coming to the office should any symptoms develop.     

## 2019-07-01 NOTE — Procedures (Signed)
PROCEDURE SUMMARY:  Successful US guided paracentesis from RLQ.  Yielded 3.7 L of clear yellow fluid.  No immediate complications.  Pt tolerated well.   Specimen was sent for labs.  EBL < 15mL  Brayton El PA-C 07/01/2019 2:38 PM

## 2019-07-04 ENCOUNTER — Other Ambulatory Visit: Payer: Self-pay

## 2019-07-04 ENCOUNTER — Encounter: Payer: Self-pay | Admitting: Family

## 2019-07-04 ENCOUNTER — Ambulatory Visit (INDEPENDENT_AMBULATORY_CARE_PROVIDER_SITE_OTHER): Payer: Self-pay | Admitting: Family

## 2019-07-04 ENCOUNTER — Ambulatory Visit (INDEPENDENT_AMBULATORY_CARE_PROVIDER_SITE_OTHER): Payer: Self-pay | Admitting: Pharmacist

## 2019-07-04 VITALS — BP 118/78 | HR 112 | Wt 210.0 lb

## 2019-07-04 DIAGNOSIS — F332 Major depressive disorder, recurrent severe without psychotic features: Secondary | ICD-10-CM

## 2019-07-04 DIAGNOSIS — R188 Other ascites: Secondary | ICD-10-CM

## 2019-07-04 DIAGNOSIS — F1020 Alcohol dependence, uncomplicated: Secondary | ICD-10-CM

## 2019-07-04 DIAGNOSIS — B20 Human immunodeficiency virus [HIV] disease: Secondary | ICD-10-CM

## 2019-07-04 DIAGNOSIS — Z21 Asymptomatic human immunodeficiency virus [HIV] infection status: Secondary | ICD-10-CM

## 2019-07-04 LAB — CYTOLOGY - NON PAP

## 2019-07-04 MED ORDER — BICTEGRAVIR-EMTRICITAB-TENOFOV 50-200-25 MG PO TABS: 1.0000 | ORAL_TABLET | Freq: Every day | ORAL | 2 refills | Status: DC

## 2019-07-04 NOTE — Patient Instructions (Signed)
Nice to meet you.  We will get you started on Biktarvy which will be sent to [facility] on [address] and [address].   Recommend calling them to set up delivery.  Please let us know if you have any questions.  Plan for follow-up in 1 month or sooner if needed with lab work on the same day.  Have a great day!

## 2019-07-04 NOTE — Assessment & Plan Note (Signed)
Thomas Atkins is a 44 year old male newly diagnosed with HIV-1 disease with initial CD4 nadir of 637 and viral load of 140,000.  Genotype was wild.  Risk factor for acquiring HIV is heterosexual contact.  No acute retroviral syndrome or history of opportunistic infection.  Discussed the pathogenesis, transmission, risks if left untreated, and treatment options for HIV disease.  We reviewed his lab work and discussed the plan of care.  He met with pharmacy staff today for orientation and medication questions.  UMAP/ADAP has been approved and will need to be renewed in July.  Start [employer].  Plan for follow-up in 1 month or sooner if needed with lab work on the same day.

## 2019-07-04 NOTE — Assessment & Plan Note (Signed)
Mr. Fendley continues to consume alcohol at a variable rate of 6-12 drinks per day on average.  This is likely contributing to his current level of ascites and liver involvement.  He is working on cutting down his alcohol intake.

## 2019-07-04 NOTE — Assessment & Plan Note (Signed)
Mr. Thomas Atkins continues to have ascites status post 3.7 L of drainage from recent paracentesis.  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  Advised to continue follow-up with gastroenterology.

## 2019-07-04 NOTE — Assessment & Plan Note (Signed)
Previously diagnosed with depression and not currently on medications.  No suicidal ideations or signs of psychosis today.  Continue management per primary care.

## 2019-07-04 NOTE — Progress Notes (Signed)
Subjective:    Patient ID: Thomas Atkins, male    DOB: 01-Apr-1975, 44 y.o.   MRN: [id number]  Chief Complaint  Patient presents with  . HIV Positive/AIDS    HPI:  Thomas Atkins is a 44 y.o. male with previous medical history of anxiety, depression, and alcoholism complicated by seizure disorder presenting today for initial evaluation and treatment of newly diagnosed HIV-1 disease.  Thomas Atkins was recently admitted to the hospital with abdominal pain and during routine screening was noted to have positive HIV-1 status.  This was the first he was notified of the diagnosis.  Most recent negative HIV testing was approximately 3 years ago.  Risk factors for acquiring HIV includes heterosexual contact.  Denies any specific timeframes of flulike symptoms although does have sweats at night.  Denies symptoms of opportunistic infection.  He has informed his parents, best friend, and roommate regarding his positive status and has good support around him.  Thomas Atkins initial clinic blood work completed on 06/08/2019 confirms HIV-1 infection with CD4 nadir of 637 and initial viral load of 140,000.  Genotype was without significant resistance and wild-type virus.  STI testing was negative for gonorrhea, chlamydia, and syphilis.  Hepatitis serology with no acute infections and no immunity to hepatitis A or B. HLAB 5701 and QuantiFERON gold negative.  Kidney function and electrolytes within normal ranges.  Liver function tests elevated with AST of 74 and ALT of 50.  Lipid profile with triglycerides 188, LDL 120, and HDL of 17.   Thomas Atkins is currently unemployed and uninsured. Approved for [employer] and ADAP/UMAP. Smokes marijuana on occasion. Drinks about 6-12 beers per day on average. Smokes about 2 packs of cigarettes per day averaging about 1 pack of day since he was 44 years of age. Has fluctuating mood and has concerns regarding his abdominal pain and ascites. Recently completed paracentesis removing 3.7 L of  clear yellow fluid on 07/01/19 and is under the care of Dr. Paulita Fujita at [facility].  Allergies  Allergen Reactions  . Bee Venom Anaphylaxis  . Lactose Intolerance (Gi) Diarrhea, Nausea Only and Other (See Comments)    Abdominal pain    . Ultram [Tramadol Hcl] Hives           Outpatient Medications Prior to Visit  Medication Sig Dispense Refill  . amLODipine (NORVASC) 10 MG tablet Take 1 tablet (10 mg total) by mouth daily. 30 tablet 5  . nystatin-triamcinolone ointment (MYCOLOG) Apply to affected area BID 30 g 1  . pantoprazole (PROTONIX) 40 MG tablet Take 1 tablet (40 mg total) by mouth daily. 30 tablet 1  . polyethylene glycol powder (GLYCOLAX/MIRALAX) 17 GM/SCOOP powder Take 17 g by mouth 2 (two) times daily as needed. 3350 g 1  . tamsulosin (FLOMAX) 0.4 MG CAPS capsule Take 1 capsule (0.4 mg total) by mouth daily. 30 capsule 3  . traZODone (DESYREL) 100 MG tablet Take 1.5 tablets (150 mg total) by mouth at bedtime as needed for sleep. 45 tablet 4  . triamcinolone cream (KENALOG) 0.1 % Apply 1 application topically 2 (two) times daily. (Patient not taking: Reported on 05/25/2019) 30 g 0   No facility-administered medications prior to visit.     Past Medical History:  Diagnosis Date  . Alcoholism ([place])   . Anxiety   . Depression   . HIV infection ([place])   . Seizure disorder ([place])   . Syncope       Past Surgical History:  Procedure  Laterality Date  . IR PARACENTESIS  07/01/2019  . SURGERY SCROTAL / TESTICULAR        Family History  Problem Relation Age of Onset  . Heart disease Paternal Grandmother   . Bipolar disorder Father       Social History   Socioeconomic History  . Marital status: Divorced    Spouse name: Not on file  . Number of children: 2  . Years of education: Not on file  . Highest education level: Not on file  Occupational History  . Occupation: Unemployed  Tobacco Use  . Smoking status: Current Every Day Smoker    Packs/day: 1.00    Years:  30.00    Pack years: 30.00    Types: Cigarettes  . Smokeless tobacco: Never Used  . Tobacco comment: Patient refused cessation materials  Substance and Sexual Activity  . Alcohol use: Yes    Alcohol/week: 24.0 standard drinks    Types: 24 Cans of beer per week    Comment: 6 pack per day  . Drug use: Yes    Types: Marijuana    Comment: on occasion  . Sexual activity: Not on file  Other Topics Concern  . Not on file  Social History Narrative  . Not on file   Social Determinants of Health   Financial Resource Strain:   . Difficulty of Paying Living Expenses:   Food Insecurity:   . Worried About Charity fundraiser in the Last Year:   . Arboriculturist in the Last Year:   Transportation Needs:   . Film/video editor (Medical):   Marland Kitchen Lack of Transportation (Non-Medical):   Physical Activity:   . Days of Exercise per Week:   . Minutes of Exercise per Session:   Stress:   . Feeling of Stress :   Social Connections:   . Frequency of Communication with Friends and Family:   . Frequency of Social Gatherings with Friends and Family:   . Attends Religious Services:   . Active Member of Clubs or Organizations:   . Attends Archivist Meetings:   Marland Kitchen Marital Status:   Intimate Partner Violence:   . Fear of Current or Ex-Partner:   . Emotionally Abused:   Marland Kitchen Physically Abused:   . Sexually Abused:       Review of Systems  Constitutional: Negative for appetite change, chills, fatigue, fever and unexpected weight change.  Eyes: Negative for visual disturbance.  Respiratory: Negative for cough, chest tightness, shortness of breath and wheezing.   Cardiovascular: Negative for chest pain and leg swelling.  Gastrointestinal: Positive for abdominal pain. Negative for constipation, diarrhea, nausea and vomiting.  Genitourinary: Negative for dysuria, flank pain, frequency, genital sores, hematuria and urgency.  Skin: Negative for rash.  Allergic/Immunologic: Negative for  immunocompromised state.  Neurological: Negative for dizziness and headaches.       Objective:    BP 118/78   Pulse (!) 112   Wt 210 lb (95.3 kg)   BMI 27.71 kg/m  Nursing note and vital signs reviewed.  Physical Exam Constitutional:      General: He is not in acute distress.    Appearance: He is well-developed.  Eyes:     Conjunctiva/sclera: Conjunctivae normal.  Cardiovascular:     Rate and Rhythm: Regular rhythm. Tachycardia present.     Heart sounds: Normal heart sounds. No murmur. No friction rub. No gallop.   Pulmonary:     Effort: Pulmonary effort is normal. No respiratory  distress.     Breath sounds: Normal breath sounds. No wheezing or rales.  Chest:     Chest wall: No tenderness.  Abdominal:     General: Bowel sounds are normal. There is distension.     Palpations: Abdomen is soft.     Tenderness: There is abdominal tenderness.     Comments: Significant ascites present.   Musculoskeletal:     Cervical back: Neck supple.  Lymphadenopathy:     Cervical: No cervical adenopathy.  Skin:    General: Skin is warm and dry.     Findings: No rash.  Neurological:     Mental Status: He is alert and oriented to person, place, and time.  Psychiatric:        Behavior: Behavior normal.        Thought Content: Thought content normal.        Judgment: Judgment normal.         Assessment & Plan:   Patient Active Problem List   Diagnosis Date Noted  . Other ascites 07/04/2019  . Constipation 06/16/2019  . Asymptomatic HIV infection ([place]) 06/16/2019  . GI bleed 05/26/2019  . History of hypertension 07/31/2017  . Alcohol use disorder, moderate, dependence ([place]) 07/31/2017  . Tobacco dependence 07/31/2017  . Alcohol-induced insomnia ([place]) 07/31/2017  . Major depressive disorder, recurrent severe without psychotic features ([place]) 10/10/2015  . Alcohol abuse with alcohol-induced mood disorder ([place]) 05/31/2015     Problem List Items Addressed This Visit      Other    Major depressive disorder, recurrent severe without psychotic features ([place])    Previously diagnosed with depression and not currently on medications.  No suicidal ideations or signs of psychosis today.  Continue management per primary care.      Alcohol use disorder, moderate, dependence ([place])    Mr. Deeney continues to consume alcohol at a variable rate of 6-12 drinks per day on average.  This is likely contributing to his current level of ascites and liver involvement.  He is working on cutting down his alcohol intake.      Asymptomatic HIV infection ([place]) - Primary    Mr. Obryant is a 44 year old male newly diagnosed with HIV-1 disease with initial CD4 nadir of 637 and viral load of 140,000.  Genotype was wild.  Risk factor for acquiring HIV is heterosexual contact.  No acute retroviral syndrome or history of opportunistic infection.  Discussed the pathogenesis, transmission, risks if left untreated, and treatment options for HIV disease.  We reviewed his lab work and discussed the plan of care.  He met with pharmacy staff today for orientation and medication questions.  UMAP/ADAP has been approved and will need to be renewed in July.  Start [employer].  Plan for follow-up in 1 month or sooner if needed with lab work on the same day.      Relevant Medications   bictegravir-emtricitabine-tenofovir AF (BIKTARVY) 50-200-25 MG TABS tablet   Other ascites    Mr. Liberto continues to have ascites status post 3.7 L of drainage from recent paracentesis.  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  Advised to continue follow-up with gastroenterology.          I have discontinued Avrum P. Villagomez's triamcinolone cream. I am also having him start on bictegravir-emtricitabine-tenofovir AF. Additionally, I am having him maintain his tamsulosin, amLODipine, traZODone, pantoprazole, nystatin-triamcinolone  ointment, and polyethylene glycol powder.   Meds ordered  this encounter  Medications  . bictegravir-emtricitabine-tenofovir AF (BIKTARVY) 50-200-25 MG TABS tablet    Sig: Take 1 tablet by mouth daily.    Dispense:  30 tablet    Refill:  2    Order Specific Question:   Supervising Provider    Answer:   Carlyle Basques [[id number]]     Follow-up: Return in about 1 month (around 08/03/2019).    Terri Piedra, MSN, FNP-C Nurse Practitioner [facility] for Infect[facility] Disease [place] number: [phone]

## 2019-07-05 NOTE — Progress Notes (Addendum)
HPI: Thomas Atkins is a 44 y.o. male who presents to the [facility] clinic today to initiate care for a newly diagnosed HIV infection.  Patient Active Problem List   Diagnosis Date Noted  . Other ascites 07/04/2019  . Constipation 06/16/2019  . Asymptomatic HIV infection (HCC) 06/16/2019  . GI bleed 05/26/2019  . History of hypertension 07/31/2017  . Alcohol use disorder, moderate, dependence (HCC) 07/31/2017  . Tobacco dependence 07/31/2017  . Alcohol-induced insomnia (HCC) 07/31/2017  . Major depressive disorder, recurrent severe without psychotic features (HCC) 10/10/2015  . Alcohol abuse with alcohol-induced mood disorder (HCC) 05/31/2015    Patient's Medications  New Prescriptions   No medications on file  Previous Medications   AMLODIPINE (NORVASC) 10 MG TABLET    Take 1 tablet (10 mg total) by mouth daily.   BICTEGRAVIR-EMTRICITABINE-TENOFOVIR AF (BIKTARVY) 50-200-25 MG TABS TABLET    Take 1 tablet by mouth daily.   NYSTATIN-TRIAMCINOLONE OINTMENT (MYCOLOG)    Apply to affected area BID   PANTOPRAZOLE (PROTONIX) 40 MG TABLET    Take 1 tablet (40 mg total) by mouth daily.   POLYETHYLENE GLYCOL POWDER (GLYCOLAX/MIRALAX) 17 GM/SCOOP POWDER    Take 17 g by mouth 2 (two) times daily as needed.   TAMSULOSIN (FLOMAX) 0.4 MG CAPS CAPSULE    Take 1 capsule (0.4 mg total) by mouth daily.   TRAZODONE (DESYREL) 100 MG TABLET    Take 1.5 tablets (150 mg total) by mouth at bedtime as needed for sleep.  Modified Medications   No medications on file  Discontinued Medications   No medications on file    Allergies: Allergies  Allergen Reactions  . Bee Venom Anaphylaxis  . Lactose Intolerance (Gi) Diarrhea, Nausea Only and Other (See Comments)    Abdominal pain    . Ultram [Tramadol Hcl] Hives         Past Medical History: Past Medical History:  Diagnosis Date  . Alcoholism (HCC)   . Anxiety   . Depression   . HIV infection (HCC)   . Seizure disorder (HCC)   . Syncope      Social History: Social History   Socioeconomic History  . Marital status: Divorced    Spouse name: Not on file  . Number of children: 2  . Years of education: Not on file  . Highest education level: Not on file  Occupational History  . Occupation: Unemployed  Tobacco Use  . Smoking status: Current Every Day Smoker    Packs/day: 1.00    Years: 30.00    Pack years: 30.00    Types: Cigarettes  . Smokeless tobacco: Never Used  . Tobacco comment: Patient refused cessation materials  Substance and Sexual Activity  . Alcohol use: Yes    Alcohol/week: 24.0 standard drinks    Types: 24 Cans of beer per week    Comment: 6 pack per day  . Drug use: Yes    Types: Marijuana    Comment: on occasion  . Sexual activity: Not on file  Other Topics Concern  . Not on file  Social History Narrative  . Not on file   Social Determinants of Health   Financial Resource Strain:   . Difficulty of Paying Living Expenses:   Food Insecurity:   . Worried About Programme researcher, broadcasting/film/video in the Last Year:   . Barista in the Last Year:   Transportation Needs:   . Freight forwarder (Medical):   Marland Kitchen Lack of Transportation (  Non-Medical):   Physical Activity:   . Days of Exercise per Week:   . Minutes of Exercise per Session:   Stress:   . Feeling of Stress :   Social Connections:   . Frequency of Communication with Friends and Family:   . Frequency of Social Gatherings with Friends and Family:   . Attends Religious Services:   . Active Member of Clubs or Organizations:   . Attends Archivist Meetings:   Marland Kitchen Marital Status:     Labs: Lab Results  Component Value Date   HIV1RNAQUANT 140,000 (H) 06/08/2019   CD4TABS 637 06/08/2019    RPR and STI Lab Results  Component Value Date   LABRPR NON-REACTIVE 06/08/2019    No flowsheet data found.  Hepatitis B Lab Results  Component Value Date   HEPBSAB NON-REACTIVE 06/08/2019   HEPBSAG NON-REACTIVE 06/08/2019   HEPBCAB  NON-REACTIVE 06/08/2019   Hepatitis C Lab Results  Component Value Date   HEPCAB NON-REACTIVE 06/08/2019   Hepatitis A Lab Results  Component Value Date   HAV NON-REACTIVE 06/08/2019   Lipids: Lab Results  Component Value Date   CHOL 167 06/08/2019   TRIG 188 (H) 06/08/2019   HDL 17 (L) 06/08/2019   CHOLHDL 9.8 (H) 06/08/2019   LDLCALC 120 (H) 06/08/2019    Current HIV Regimen: Treatment naive  Assessment: Thomas Atkins is here today to initiate care with Marya Amsler for his newly diagnosed HIV infection.  He is treatment naive with an initial HIV viral load of 140,000 and a CD4 count of 637.  No resistance mutations found on initial genotype. Will start patient on [place].  Explained that Phillips Odor is a one pill once daily medication with or without food and the importance of not missing any doses. Explained resistance and how it develops and why it is so important to take Biktarvy daily and not skip days or doses. Counseled patient to take it around the same time each day. Counseled on what to do if dose is missed, if closer to missed dose take immediately, if closer to next dose then skip and resume normal schedule.   Cautioned on possible side effects the first week or so including nausea, diarrhea, dizziness, and headaches but that they should resolve after the first couple of weeks. I reviewed patient medications and found no drug interactions. Counseled patient to separate Biktarvy from divalent cations including multivitamins. Discussed with patient to call clinic if he starts a new medication or herbal supplement. I gave the patient my card and told him to call me with any issues/questions/concerns.  Plan: - Start Biktarvy PO once daily  Laquentin Loudermilk L. Jaedyn Lard, PharmD, BCIDP, AAHIVP, CPP Clinical Pharmacist Practitioner Infectious Diseases [place] 07/05/2019, 2:02 PM

## 2019-07-07 ENCOUNTER — Other Ambulatory Visit: Payer: Self-pay

## 2019-07-07 ENCOUNTER — Encounter (HOSPITAL_COMMUNITY): Payer: Self-pay | Admitting: Emergency Medicine

## 2019-07-07 ENCOUNTER — Emergency Department (HOSPITAL_COMMUNITY)
Admission: EM | Admit: 2019-07-07 | Discharge: 2019-07-07 | Disposition: A | Discharge status: Home / Self Care | Payer: Self-pay | Attending: Emergency Medicine | Admitting: Emergency Medicine

## 2019-07-07 ENCOUNTER — Telehealth: Payer: Self-pay

## 2019-07-07 DIAGNOSIS — Z5321 Procedure and treatment not carried out due to patient leaving prior to being seen by health care provider: Secondary | ICD-10-CM | POA: Insufficient documentation

## 2019-07-07 DIAGNOSIS — R109 Unspecified abdominal pain: Secondary | ICD-10-CM | POA: Insufficient documentation

## 2019-07-07 LAB — CBC
HCT: 40.6 % (ref 39.0–52.0)
Hemoglobin: 13.6 g/dL (ref 13.0–17.0)
MCH: 31.6 pg (ref 26.0–34.0)
MCHC: 33.5 g/dL (ref 30.0–36.0)
MCV: 94.2 fL (ref 80.0–100.0)
Platelets: 315 10*3/uL (ref 150–400)
RBC: 4.31 MIL/uL (ref 4.22–5.81)
RDW: 15.9 % — ABNORMAL HIGH (ref 11.5–15.5)
WBC: 8.2 10*3/uL (ref 4.0–10.5)
nRBC: 0 % (ref 0.0–0.2)

## 2019-07-07 LAB — COMPREHENSIVE METABOLIC PANEL
ALT: 53 U/L — ABNORMAL HIGH (ref 0–44)
AST: 118 U/L — ABNORMAL HIGH (ref 15–41)
Albumin: 2.7 g/dL — ABNORMAL LOW (ref 3.5–5.0)
Alkaline Phosphatase: 192 U/L — ABNORMAL HIGH (ref 38–126)
Anion gap: 8 (ref 5–15)
BUN: 5 mg/dL — ABNORMAL LOW (ref 6–20)
CO2: 25 mmol/L (ref 22–32)
Calcium: 8.3 mg/dL — ABNORMAL LOW (ref 8.9–10.3)
Chloride: 101 mmol/L (ref 98–111)
Creatinine, Ser: 0.78 mg/dL (ref 0.61–1.24)
GFR calc Af Amer: >60 mL/min (ref >60)
GFR calc non Af Amer: >60 mL/min (ref >60)
Glucose, Bld: 112 mg/dL — ABNORMAL HIGH (ref 70–99)
Potassium: 3.6 mmol/L (ref 3.5–5.1)
Sodium: 134 mmol/L — ABNORMAL LOW (ref 135–145)
Total Bilirubin: 1.5 mg/dL — ABNORMAL HIGH (ref 0.3–1.2)
Total Protein: 7.9 g/dL (ref 6.5–8.1)

## 2019-07-07 LAB — LIPASE, BLOOD: Lipase: 42 U/L (ref 11–51)

## 2019-07-07 NOTE — Telephone Encounter (Signed)
Agree with Emily's assessment. We sent patient to ED.

## 2019-07-07 NOTE — ED Triage Notes (Signed)
Pt endorses abd pain, abd swelling, and throwing up blood for 2 days. Pt reports a recent paracentesis. Abd very distended. Pt also reports rash over body.

## 2019-07-07 NOTE — Telephone Encounter (Signed)
Thomas Atkins walked into the clinic today for an unscheduled appointment to discuss adverse effects associated with his new HIV medication Biktarvy. He stated that he started this medication on the 27th and 4 hours later started vomiting blood and bleeding from his nose (he has pictures on his phone of this). He took it on the 28th, and the same thing occurred 6 hours later. He took his dose of Biktarvy this morning ~8 am, but has had no bleeding occurrences today. He has also developed a rash near his groin and on his legs, but he has bug bites, too. Today he says he is feeling fine and has no lightheadedness. After speaking with his ID provider, we advised him to go to the ED today to be evaluated. His symptoms are not typical of adverse effects associated with Biktarvy, and he has a history of decompensated liver disease which could be contributing to his current issues.   Ellison Carwin, PharmD PGY1 Pharmacy Resident

## 2019-07-12 ENCOUNTER — Encounter: Payer: Self-pay | Admitting: Family

## 2019-07-12 ENCOUNTER — Other Ambulatory Visit (HOSPITAL_COMMUNITY): Payer: Self-pay | Admitting: Gastroenterology

## 2019-07-12 DIAGNOSIS — K7031 Alcoholic cirrhosis of liver with ascites: Secondary | ICD-10-CM

## 2019-07-14 ENCOUNTER — Other Ambulatory Visit: Payer: Self-pay

## 2019-07-14 ENCOUNTER — Ambulatory Visit (HOSPITAL_BASED_OUTPATIENT_CLINIC_OR_DEPARTMENT_OTHER): Payer: Self-pay | Admitting: Internal Medicine

## 2019-07-14 DIAGNOSIS — Z538 Procedure and treatment not carried out for other reasons: Secondary | ICD-10-CM

## 2019-07-15 ENCOUNTER — Ambulatory Visit (HOSPITAL_COMMUNITY)
Admission: RE | Admit: 2019-07-15 | Discharge: 2019-07-15 | Disposition: A | Discharge status: Home / Self Care | Payer: Self-pay | Source: Ambulatory Visit | Attending: Gastroenterology | Admitting: Gastroenterology

## 2019-07-15 ENCOUNTER — Other Ambulatory Visit: Payer: Self-pay

## 2019-07-15 DIAGNOSIS — K7031 Alcoholic cirrhosis of liver with ascites: Secondary | ICD-10-CM | POA: Insufficient documentation

## 2019-07-15 HISTORY — PX: IR PARACENTESIS: IMG2679

## 2019-07-15 MED ORDER — LIDOCAINE HCL 1 % IJ SOLN
INTRAMUSCULAR | Status: DC | PRN
  Administered 2019-07-15: 10 mL

## 2019-07-15 MED ORDER — LIDOCAINE HCL 1 % IJ SOLN
INTRAMUSCULAR | Status: AC
  Filled 2019-07-15: qty 20

## 2019-07-15 MED ORDER — ALBUMIN HUMAN 25 % IV SOLN
50.0000 g | Freq: Once | INTRAVENOUS | Status: AC
  Administered 2019-07-15: 50 g via INTRAVENOUS

## 2019-07-15 MED ORDER — ALBUMIN HUMAN 25 % IV SOLN
INTRAVENOUS | Status: AC
  Filled 2019-07-15: qty 200

## 2019-07-15 MED FILL — PANTOPRAZOLE SOD DR 40 MG T: 40 | 30 days supply | Qty: 30 | Fill #1

## 2019-07-15 MED FILL — AMLODIPINE BESYLATE 10 MG T: 10 | 30 days supply | Qty: 30 | Fill #3

## 2019-07-15 MED FILL — TAMSULOSIN HCL 0.4 MG CAP: 0.4 | 30 days supply | Qty: 30 | Fill #1

## 2019-07-15 NOTE — Procedures (Signed)
Ultrasound-guided  therapeutic paracentesis performed yielding 4.7 liters of straw colored fluid. No immediate complications. EBL is none.  

## 2019-07-22 ENCOUNTER — Other Ambulatory Visit: Payer: Self-pay

## 2019-07-22 ENCOUNTER — Ambulatory Visit: Payer: Self-pay | Attending: Internal Medicine | Admitting: Internal Medicine

## 2019-07-22 ENCOUNTER — Encounter: Payer: Self-pay | Admitting: Internal Medicine

## 2019-07-22 VITALS — BP 110/80 | HR 113 | Ht 73.0 in | Wt 216.0 lb

## 2019-07-22 DIAGNOSIS — R188 Other ascites: Secondary | ICD-10-CM

## 2019-07-22 DIAGNOSIS — F1020 Alcohol dependence, uncomplicated: Secondary | ICD-10-CM

## 2019-07-22 DIAGNOSIS — Z21 Asymptomatic human immunodeficiency virus [HIV] infection status: Secondary | ICD-10-CM

## 2019-07-22 DIAGNOSIS — R21 Rash and other nonspecific skin eruption: Secondary | ICD-10-CM

## 2019-07-22 MED ORDER — PREDNISONE 20 MG PO TABS: ORAL_TABLET | ORAL | 0 refills | Status: DC

## 2019-07-22 MED ORDER — FUROSEMIDE 20 MG PO TABS: 10.0000 mg | ORAL_TABLET | Freq: Every day | ORAL | 3 refills | Status: DC

## 2019-07-22 MED ORDER — SPIRONOLACTONE 25 MG PO TABS: 25.0000 mg | ORAL_TABLET | Freq: Every day | ORAL | 1 refills | Status: DC

## 2019-07-22 NOTE — Progress Notes (Signed)
Patient ID: Thomas Atkins, male    DOB: 09/14/1975  MRN: [id number]  CC: Rash and Abdominal Pain   Subjective: Thomas Atkins is a 44 y.o. male who presents for UC visit. His concerns today include:  Patient with history ofHTN,HIV, alcohol use disorder,tob dep, anx/dep, ?seizure disorder.  Last seen by me 1 mth ago Since last visit with me, he has developed significant ascites requiring paracentesis x2.  He had paracentesis on 07/01/2019 with 3.7 L removed and again on 07/15/2019 with 4.7 L removed.  Cytology revealed no cancerous cells.  Patient is emotionally distraught stating that he has so much things going on and he wants to know what is causing the ascites and what can be done to remedy the situation.  He has seen Dr. Dulce Sellar and states he was not told anything about his condition.  All he was told was that he would need to have fluid removed off his abdomen.  He thinks he needs to see someone else but does not have insurance.  He missed his appointment last week to meet with the financial counselor to try to get approved for the orange card/cone discount.  He tells me that he has just about quit drinking. -Went to the emergency room on 07/07/2019 with complaints of abdominal pain and swelling with nosebleed. -He has reaccumulation of fluid on the abdomen.  He has decreased appetite because of it.  Very uncomfortable for him.  Other concern is new rash on the legs.  He woke up this morning with an itchy burning rash on both legs.  No initiating factors.   He has established with infectious disease and is currently on Biktarvy. Patient Active Problem List   Diagnosis Date Noted  . Other ascites 07/04/2019  . Constipation 06/16/2019  . Asymptomatic HIV infection (HCC) 06/16/2019  . GI bleed 05/26/2019  . History of hypertension 07/31/2017  . Alcohol use disorder, moderate, dependence (HCC) 07/31/2017  . Tobacco dependence 07/31/2017  . Alcohol-induced insomnia (HCC) 07/31/2017  .  Major depressive disorder, recurrent severe without psychotic features (HCC) 10/10/2015  . Alcohol abuse with alcohol-induced mood disorder (HCC) 05/31/2015     Current Outpatient Medications on File Prior to Visit  Medication Sig Dispense Refill  . bictegravir-emtricitabine-tenofovir AF (BIKTARVY) 50-200-25 MG TABS tablet Take 1 tablet by mouth daily. 30 tablet 2  . nystatin-triamcinolone ointment (MYCOLOG) Apply to affected area BID 30 g 1  . pantoprazole (PROTONIX) 40 MG tablet Take 1 tablet (40 mg total) by mouth daily. 30 tablet 1  . polyethylene glycol powder (GLYCOLAX/MIRALAX) 17 GM/SCOOP powder Take 17 g by mouth 2 (two) times daily as needed. 3350 g 1  . tamsulosin (FLOMAX) 0.4 MG CAPS capsule Take 1 capsule (0.4 mg total) by mouth daily. 30 capsule 3  . traZODone (DESYREL) 100 MG tablet Take 1.5 tablets (150 mg total) by mouth at bedtime as needed for sleep. 45 tablet 4   No current facility-administered medications on file prior to visit.    Allergies  Allergen Reactions  . Bee Venom Anaphylaxis  . Lactose Intolerance (Gi) Diarrhea, Nausea Only and Other (See Comments)    Abdominal pain    . Ultram [Tramadol Hcl] Hives         Social History   Socioeconomic History  . Marital status: Divorced    Spouse name: Not on file  . Number of children: 2  . Years of education: Not on file  . Highest education level: Not on file  Occupational History  . Occupation: Unemployed  Tobacco Use  . Smoking status: Current Every Day Smoker    Packs/day: 1.00    Years: 30.00    Pack years: 30.00    Types: Cigarettes  . Smokeless tobacco: Never Used  . Tobacco comment: Patient refused cessation materials  Substance and Sexual Activity  . Alcohol use: Yes    Alcohol/week: 24.0 standard drinks    Types: 24 Cans of beer per week    Comment: 6 pack per day  . Drug use: Yes    Types: Marijuana    Comment: on occasion  . Sexual activity: Not on file  Other Topics Concern  . Not  on file  Social History Narrative  . Not on file   Social Determinants of Health   Financial Resource Strain:   . Difficulty of Paying Living Expenses:   Food Insecurity:   . Worried About Programme researcher, broadcasting/film/video in the Last Year:   . Barista in the Last Year:   Transportation Needs:   . Freight forwarder (Medical):   Marland Kitchen Lack of Transportation (Non-Medical):   Physical Activity:   . Days of Exercise per Week:   . Minutes of Exercise per Session:   Stress:   . Feeling of Stress :   Social Connections:   . Frequency of Communication with Friends and Family:   . Frequency of Social Gatherings with Friends and Family:   . Attends Religious Services:   . Active Member of Clubs or Organizations:   . Attends Banker Meetings:   Marland Kitchen Marital Status:   Intimate Partner Violence:   . Fear of Current or Ex-Partner:   . Emotionally Abused:   Marland Kitchen Physically Abused:   . Sexually Abused:     Family History  Problem Relation Age of Onset  . Heart disease Paternal Grandmother   . Bipolar disorder Father     Past Surgical History:  Procedure Laterality Date  . IR PARACENTESIS  07/01/2019  . IR PARACENTESIS  07/15/2019  . SURGERY SCROTAL / TESTICULAR      ROS: Review of Systems Negative except as stated above  PHYSICAL EXAM: BP 110/80   Pulse (!) 113   Ht 6\' 1"  (1.854 m)   Wt 216 lb (98 kg)   SpO2 97%   BMI 28.50 kg/m   Wt Readings from Last 3 Encounters:  07/22/19 216 lb (98 kg)  07/07/19 210 lb (95.3 kg)  07/04/19 210 lb (95.3 kg)  BP 110/80  Physical Exam General appearance -middle-aged Caucasian male in NAD.  He has noted loss of muscle mass in his arms Mental status -patient is intermittently tearful when talking about his current condition Eyes: No scleral icterus.   Nose - normal and patent, no erythema, discharge or polyps Mouth - mucous membranes moist, pharynx normal without lesions Neck - supple, no significant adenopathy Chest - clear to  auscultation, no wheezes, rales or rhonchi, symmetric air entry Heart -mild tachycardia but regular. Abdomen -severe distention with tense ascites and veins visible.  Nontender. Extremities -1+ bilateral ankle edema.  Trace to 1+ lower extremity edema Skin -patchy erythematous slightly raised rash on both lower extremities.  Rash is tender to touch.  See pictures below      CMP Latest Ref Rng & Units 07/07/2019 06/08/2019 05/25/2019  Glucose 70 - 99 mg/dL 05/27/2019) 213(Y) 99  BUN 6 - 20 mg/dL 5(L) 10 5(L)  Creatinine 0.61 - 1.24 mg/dL 865(H 8.46  0.80  Sodium 135 - 145 mmol/L 134(L) 136 137  Potassium 3.5 - 5.1 mmol/L 3.6 4.0 4.0  Chloride 98 - 111 mmol/L 101 103 103  CO2 22 - 32 mmol/L 25 24 19(L)  Calcium 8.9 - 10.3 mg/dL 8.3(L) 8.6 9.3  Total Protein 6.5 - 8.1 g/dL 7.9 7.5 8.1  Total Bilirubin 0.3 - 1.2 mg/dL 1.5(H) 1.1 0.9  Alkaline Phos 38 - 126 U/L 192(H) - 96  AST 15 - 41 U/L 118(H) 74(H) 119(H)  ALT 0 - 44 U/L 53(H) 50(H) 72(H)   Lipid Panel     Component Value Date/Time   CHOL 167 06/08/2019 0925   TRIG 188 (H) 06/08/2019 0925   HDL 17 (L) 06/08/2019 0925   CHOLHDL 9.8 (H) 06/08/2019 0925   LDLCALC 120 (H) 06/08/2019 0925    CBC    Component Value Date/Time   WBC 8.2 07/07/2019 1217   RBC 4.31 07/07/2019 1217   HGB 13.6 07/07/2019 1217   HGB 15.7 09/01/2018 1146   HCT 40.6 07/07/2019 1217   HCT 46.2 09/01/2018 1146   PLT 315 07/07/2019 1217   PLT 262 09/01/2018 1146   MCV 94.2 07/07/2019 1217   MCV 96 09/01/2018 1146   MCH 31.6 07/07/2019 1217   MCHC 33.5 07/07/2019 1217   RDW 15.9 (H) 07/07/2019 1217   RDW 14.7 09/01/2018 1146   LYMPHSABS 1,321 06/08/2019 0925   MONOABS 0.6 05/26/2019 0721   EOSABS 130 06/08/2019 0925   BASOSABS 31 06/08/2019 0925    ASSESSMENT AND PLAN: 1. Ascites of liver Likely due to alcoholic cirrhosis.  Advised patient that if it is, there is really no easy fix.  I have encouraged him to completely abstain from alcohol.  - Stop  amlodipine.  Start low-dose spironolactone and furosemide.  We will have him follow-up in 2 weeks to recheck kidney function on these medications.   -Advised low-salt diet less than 2 g a day. -Looks like he needs to have another paracentesis and probably would need to be scheduled every 1 to 2 weeks. -I called Dr. Erlinda Hong office.  He was not there but I was able to speak with the nurse Melissa.  I told her that the patient looks like he needs to have another paracentesis and probably needs to be scheduled about once a week.  I also wanted to find out what was Dr. Erlinda Hong impression as to the cause of the ascites.  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  She tells me that patient has a follow-up visit June 1 - Protime-INR - PT AND PTT - Comprehensive metabolic panel - spironolactone (ALDACTONE) 25 MG tablet; Take 1 tablet (25 mg total) by mouth daily.  Dispense: 30 tablet; Refill: 1 - furosemide (LASIX) 20 MG tablet; Take 0.5 tablets (10 mg total) by mouth daily.  Dispense: 30 tablet; Refill: 3  2. Alcohol use disorder, moderate, dependence ([place]) See #1 above  3. Rash and nonspecific skin eruption The rash looks vasculitic.  I have started him on some prednisone and we will check some baseline blood test.  Message sent to the ID specialist requesting that he take a look at the pictures in the note and inquiring whether Biktarvy can cause a rash like this - Sedimentation Rate - ANA w/Reflex - predniSONE (DELTASONE) 20 MG tablet; 1.5 tab daily x 2 days then 1 tab daily  Dispense: 8 tablet; Refill: 0  4. Asymptomatic HIV infection (  [facility])   Patient was given the opportunity to ask questions.  Patient verbalized understanding of the plan and was able to repeat key elements of the plan.   Orders Placed This Encounter  Procedures  . Protime-INR  . PT AND PTT  . Comprehensive metabolic panel  . Sedimentation Rate  . ANA w/Reflex      Requested Prescriptions   Signed Prescriptions Disp Refills  . predniSONE (DELTASONE) 20 MG tablet 8 tablet 0    Sig: 1.5 tab daily x 2 days then 1 tab daily  . spironolactone (ALDACTONE) 25 MG tablet 30 tablet 1    Sig: Take 1 tablet (25 mg total) by mouth daily.  . furosemide (LASIX) 20 MG tablet 30 tablet 3    Sig: Take 0.5 tablets (10 mg total) by mouth daily.    Return in about 2 weeks (around 08/05/2019).  [name], MD, FACP

## 2019-07-22 NOTE — Patient Instructions (Signed)
Stop amlodipine. Start spironolactone 25 mg daily and furosemide 20 mg 1/2 tab daily.  Take the prednisone for 8 days as prescribed to see if it will help get rid of the rash.

## 2019-07-22 NOTE — Progress Notes (Signed)
Has rash on both lower legs, he noticed this morning when he woke.

## 2019-07-23 LAB — COMPREHENSIVE METABOLIC PANEL
ALT: 39 IU/L (ref 0–44)
AST: 88 IU/L — ABNORMAL HIGH (ref 0–40)
Albumin/Globulin Ratio: 0.7 — ABNORMAL LOW (ref 1.2–2.2)
Albumin: 3.3 g/dL — ABNORMAL LOW (ref 4.0–5.0)
Alkaline Phosphatase: 278 IU/L — ABNORMAL HIGH (ref 39–117)
BUN/Creatinine Ratio: 8 — ABNORMAL LOW (ref 9–20)
BUN: 6 mg/dL (ref 6–24)
Bilirubin Total: 1.6 mg/dL — ABNORMAL HIGH (ref 0.0–1.2)
CO2: 20 mmol/L (ref 20–29)
Calcium: 8.6 mg/dL — ABNORMAL LOW (ref 8.7–10.2)
Chloride: 99 mmol/L (ref 96–106)
Creatinine, Ser: 0.77 mg/dL (ref 0.76–1.27)
GFR calc Af Amer: 126 mL/min/{1.73_m2} (ref >59)
GFR calc non Af Amer: 109 mL/min/{1.73_m2} (ref >59)
Globulin, Total: 4.6 g/dL — ABNORMAL HIGH (ref 1.5–4.5)
Glucose: 102 mg/dL — ABNORMAL HIGH (ref 65–99)
Potassium: 4.5 mmol/L (ref 3.5–5.2)
Sodium: 134 mmol/L (ref 134–144)
Total Protein: 7.9 g/dL (ref 6.0–8.5)

## 2019-07-23 LAB — ANA W/REFLEX: Anti Nuclear Antibody (ANA): NEGATIVE

## 2019-07-23 LAB — SEDIMENTATION RATE: Sed Rate: 29 mm/hr — ABNORMAL HIGH (ref 0–15)

## 2019-07-23 LAB — PT AND PTT
INR: 1.1 (ref 0.9–1.2)
Prothrombin Time: 12 s (ref 9.1–12.0)
aPTT: 35 s — ABNORMAL HIGH (ref 24–33)

## 2019-07-25 ENCOUNTER — Other Ambulatory Visit: Payer: Self-pay | Admitting: Gastroenterology

## 2019-07-25 ENCOUNTER — Other Ambulatory Visit (HOSPITAL_COMMUNITY): Payer: Self-pay | Admitting: Gastroenterology

## 2019-07-25 DIAGNOSIS — R188 Other ascites: Secondary | ICD-10-CM

## 2019-07-26 ENCOUNTER — Telehealth: Payer: Self-pay | Admitting: Internal Medicine

## 2019-07-26 NOTE — Telephone Encounter (Signed)
Phone call placed to patient today.  I left message on his voicemail letting him know that I did hear back from Dr. Hulen Shouts nurse Efraim Kaufmann yesterday.  The have arranged for him to get paracentesis every week for the next 3 weeks.  He has a follow-up appointment with them June 1 and I encouraged him to keep that appointment.  They also agreed with me starting him on the diuretics.  I also wanted to find out from the patient whether the prednisone has helped in terms of the rash that he was having.

## 2019-07-27 ENCOUNTER — Ambulatory Visit (HOSPITAL_COMMUNITY)
Admission: RE | Admit: 2019-07-27 | Discharge: 2019-07-27 | Disposition: A | Discharge status: Home / Self Care | Payer: Self-pay | Source: Ambulatory Visit | Attending: Gastroenterology | Admitting: Gastroenterology

## 2019-07-27 ENCOUNTER — Other Ambulatory Visit: Payer: Self-pay

## 2019-07-27 DIAGNOSIS — R188 Other ascites: Secondary | ICD-10-CM | POA: Insufficient documentation

## 2019-07-27 HISTORY — PX: IR PARACENTESIS: IMG2679

## 2019-07-27 LAB — PROTEIN, PLEURAL OR PERITONEAL FLUID: Total protein, fluid: <3 g/dL

## 2019-07-27 LAB — BODY FLUID CELL COUNT WITH DIFFERENTIAL
Eos, Fluid: 0 %
Lymphs, Fluid: 72 %
Monocyte-Macrophage-Serous Fluid: 21 % — ABNORMAL LOW (ref 50–90)
Neutrophil Count, Fluid: 6 % (ref 0–25)
Other Cells, Fluid: 1 %
Total Nucleated Cell Count, Fluid: 104 cu mm (ref 0–1000)

## 2019-07-27 LAB — ALBUMIN, PLEURAL OR PERITONEAL FLUID: Albumin, Fluid: 1.1 g/dL

## 2019-07-27 MED ORDER — ALBUMIN HUMAN 25 % IV SOLN
50.0000 g | Freq: Once | INTRAVENOUS | Status: AC
  Administered 2019-07-27: 50 g via INTRAVENOUS

## 2019-07-27 MED ORDER — LIDOCAINE HCL 1 % IJ SOLN
INTRAMUSCULAR | Status: AC
  Filled 2019-07-27: qty 20

## 2019-07-27 MED ORDER — ALBUMIN HUMAN 25 % IV SOLN
INTRAVENOUS | Status: AC
  Filled 2019-07-27: qty 200

## 2019-07-27 MED ORDER — LIDOCAINE HCL 1 % IJ SOLN
INTRAMUSCULAR | Status: DC | PRN
  Administered 2019-07-27: 10 mL

## 2019-07-27 NOTE — Procedures (Signed)
PROCEDURE SUMMARY:  Successful image-guided paracentesis from the right lateral abdomen.  Yielded 5.6 liters of clear gold fluid.  No immediate complications.  EBL = 0 mL. Patient tolerated well.   Specimen was sent for labs.  Please see imaging section of Epic for full dictation.   Thomas Binz PA-C 07/27/2019 1:30 PM

## 2019-07-28 LAB — TRIGLYCERIDES, BODY FLUIDS: Triglycerides, Fluid: 57 mg/dL

## 2019-07-28 LAB — PATHOLOGIST SMEAR REVIEW

## 2019-08-01 ENCOUNTER — Ambulatory Visit: Payer: Self-pay

## 2019-08-03 ENCOUNTER — Other Ambulatory Visit: Payer: Self-pay

## 2019-08-03 ENCOUNTER — Ambulatory Visit (HOSPITAL_COMMUNITY)
Admission: RE | Admit: 2019-08-03 | Discharge: 2019-08-03 | Disposition: A | Discharge status: Home / Self Care | Payer: Self-pay | Source: Ambulatory Visit | Attending: Gastroenterology | Admitting: Gastroenterology

## 2019-08-03 ENCOUNTER — Encounter: Payer: Self-pay | Admitting: Family

## 2019-08-03 ENCOUNTER — Ambulatory Visit (INDEPENDENT_AMBULATORY_CARE_PROVIDER_SITE_OTHER): Payer: Self-pay | Admitting: Family

## 2019-08-03 VITALS — BP 112/74 | HR 116 | Wt 197.0 lb

## 2019-08-03 DIAGNOSIS — Z23 Encounter for immunization: Secondary | ICD-10-CM

## 2019-08-03 DIAGNOSIS — R188 Other ascites: Secondary | ICD-10-CM

## 2019-08-03 DIAGNOSIS — K7031 Alcoholic cirrhosis of liver with ascites: Secondary | ICD-10-CM | POA: Insufficient documentation

## 2019-08-03 DIAGNOSIS — Z21 Asymptomatic human immunodeficiency virus [HIV] infection status: Secondary | ICD-10-CM

## 2019-08-03 DIAGNOSIS — Z0000 Encounter for general adult medical examination without abnormal findings: Secondary | ICD-10-CM | POA: Insufficient documentation

## 2019-08-03 HISTORY — PX: IR PARACENTESIS: IMG2679

## 2019-08-03 LAB — PROTEIN, PLEURAL OR PERITONEAL FLUID: Total protein, fluid: 3.1 g/dL

## 2019-08-03 LAB — BODY FLUID CELL COUNT WITH DIFFERENTIAL
Eos, Fluid: 0 %
Lymphs, Fluid: 36 %
Monocyte-Macrophage-Serous Fluid: 62 % (ref 50–90)
Neutrophil Count, Fluid: 2 % (ref 0–25)
Total Nucleated Cell Count, Fluid: 398 cu mm (ref 0–1000)

## 2019-08-03 LAB — ALBUMIN, PLEURAL OR PERITONEAL FLUID: Albumin, Fluid: 1.3 g/dL

## 2019-08-03 MED ORDER — BICTEGRAVIR-EMTRICITAB-TENOFOV 50-200-25 MG PO TABS: 1.0000 | ORAL_TABLET | Freq: Every day | ORAL | 2 refills | Status: DC

## 2019-08-03 MED ORDER — LIDOCAINE HCL 1 % IJ SOLN
INTRAMUSCULAR | Status: AC
  Administered 2019-08-03: 10 mL
  Filled 2019-08-03: qty 20

## 2019-08-03 NOTE — Assessment & Plan Note (Signed)
Thomas Atkins appears to be doing well with his first month of Biktarvy with no adverse side effects or missed doses since his last office visit.  Previously described problems and phone notes unlikely related to [place].  He has no signs/symptoms of opportunistic infection or progressive HIV disease.  We reviewed previous lab work and discussed the plan of care.  Continue current dose of Biktarvy.  Check blood work today.  Plan for follow-up in 1 month or sooner if needed.

## 2019-08-03 NOTE — Assessment & Plan Note (Signed)
Mr. Starnes continues to have ascites and is status post paracentesis today.  Currently under care of gastroenterology with repeat paracentesis scheduled for next week.  Continue management with medications and paracentesis per gastroenterology.

## 2019-08-03 NOTE — Patient Instructions (Signed)
Nice to see you.  We will check your blood work today.  Continue to take your [place] daily as prescribed.   Refills will be sent to the pharmacy.  Plan to follow up in 1 month or sooner if needed with lab work on the same day.  Have a great day and stay safe!

## 2019-08-03 NOTE — Progress Notes (Signed)
Subjective:    Patient ID: Thomas Atkins, male    DOB: 09-Jun-1975, 44 y.o.   MRN: [id number]  Chief Complaint  Patient presents with  . Follow-up     HPI:  Thomas SCHWEISS is a 44 y.o. male with HIV disease who was last seen in the office on 07/04/2019 with newly diagnosed HIV disease with CD4 count of 637 and initial viral load of 140,000.  Wild-type virus present on genotype.  He was started on Biktarvy at the time.  Thomas Atkins has been taking his Biktarvy as prescribed with no adverse side effects or missed doses since his last office visit.  Continues to have abdominal pain and is status post paracentesis today.  Otherwise doing okay. Denies fevers, chills, night sweats, headaches, changes in vision, neck pain/stiffness, nausea, diarrhea, vomiting, lesions or rashes.  Thomas Atkins has no problems obtaining his medication from the pharmacy.  He does have feelings of depression at times secondary to his current health status.  Continues to smoke marijuana on occasion and tobacco at an average of 1 pack/day.  Continues to drink alcohol and reducing intake.  Not currently sexually active.  Previously noted a new onset vasculitic rash located on bilateral lower extremities treated with prednisone by primary care has since resolved.   Allergies  Allergen Reactions  . Bee Venom Anaphylaxis  . Lactose Intolerance (Gi) Diarrhea, Nausea Only and Other (See Comments)    Abdominal pain    . Ultram [Tramadol Hcl] Hives           Outpatient Medications Prior to Visit  Medication Sig Dispense Refill  . bictegravir-emtricitabine-tenofovir AF (BIKTARVY) 50-200-25 MG TABS tablet Take 1 tablet by mouth daily. 30 tablet 2  . furosemide (LASIX) 20 MG tablet Take 0.5 tablets (10 mg total) by mouth daily. 30 tablet 3  . nystatin-triamcinolone ointment (MYCOLOG) Apply to affected area BID 30 g 1  . pantoprazole (PROTONIX) 40 MG tablet Take 1 tablet (40 mg total) by mouth daily. 30 tablet 1  .  polyethylene glycol powder (GLYCOLAX/MIRALAX) 17 GM/SCOOP powder Take 17 g by mouth 2 (two) times daily as needed. 3350 g 1  . predniSONE (DELTASONE) 20 MG tablet 1.5 tab daily x 2 days then 1 tab daily 8 tablet 0  . spironolactone (ALDACTONE) 25 MG tablet Take 1 tablet (25 mg total) by mouth daily. 30 tablet 1  . tamsulosin (FLOMAX) 0.4 MG CAPS capsule Take 1 capsule (0.4 mg total) by mouth daily. 30 capsule 3  . traZODone (DESYREL) 100 MG tablet Take 1.5 tablets (150 mg total) by mouth at bedtime as needed for sleep. 45 tablet 4   No facility-administered medications prior to visit.     Past Medical History:  Diagnosis Date  . Alcoholism ([place])   . Anxiety   . Depression   . HIV infection ([place])   . Seizure disorder ([place])   . Syncope      Past Surgical History:  Procedure Laterality Date  . IR PARACENTESIS  07/01/2019  . IR PARACENTESIS  07/15/2019  . IR PARACENTESIS  07/27/2019  . IR PARACENTESIS  08/03/2019  . SURGERY SCROTAL / TESTICULAR         Review of Systems  Constitutional: Negative for appetite change, chills, fatigue, fever and unexpected weight change.  Eyes: Negative for visual disturbance.  Respiratory: Negative for cough, chest tightness, shortness of breath and wheezing.   Cardiovascular: Negative for chest pain and leg swelling.  Gastrointestinal: Negative for abdominal pain,  constipation, diarrhea, nausea and vomiting.  Genitourinary: Negative for dysuria, flank pain, frequency, genital sores, hematuria and urgency.  Skin: Negative for rash.  Allergic/Immunologic: Negative for immunocompromised state.  Neurological: Negative for dizziness and headaches.      Objective:    BP 112/74   Pulse (!) 116   Wt 197 lb (89.4 kg)   BMI 25.99 kg/m  Nursing note and vital signs reviewed.  Physical Exam Constitutional:      General: He is not in acute distress.    Appearance: He is well-developed.  Eyes:     Conjunctiva/sclera: Conjunctivae normal.    Cardiovascular:     Rate and Rhythm: Normal rate and regular rhythm.     Heart sounds: Normal heart sounds. No murmur. No friction rub. No gallop.   Pulmonary:     Effort: Pulmonary effort is normal. No respiratory distress.     Breath sounds: Normal breath sounds. No wheezing or rales.  Chest:     Chest wall: No tenderness.  Abdominal:     General: Bowel sounds are normal. There is distension.     Palpations: Abdomen is soft.     Tenderness: There is no abdominal tenderness.  Musculoskeletal:     Cervical back: Neck supple.  Lymphadenopathy:     Cervical: No cervical adenopathy.  Skin:    General: Skin is warm and dry.     Findings: No rash.  Neurological:     Mental Status: He is alert and oriented to person, place, and time.  Psychiatric:        Behavior: Behavior normal.        Thought Content: Thought content normal.        Judgment: Judgment normal.      Depression screen [facility] 2/9 08/03/2019 07/22/2019 07/04/2019 09/01/2018 09/03/2017  Decreased Interest 0 3 0 1 2  Down, Depressed, Hopeless 1 3 1 1 2   PHQ - 2 Score 1 6 1 2 4   Altered sleeping - 3 - 3 3  Tired, decreased energy - 3 - 1 3  Change in appetite - 0 - 0 3  Feeling bad or failure about yourself  - 0 - 1 0  Trouble concentrating - 1 - 0 0  Moving slowly or fidgety/restless - 3 - 0 1  Suicidal thoughts - 3 - 0 0  PHQ-9 Score - 19 - 7 14       Assessment & Plan:    Patient Active Problem List   Diagnosis Date Noted  . Healthcare maintenance 08/03/2019  . Rash and nonspecific skin eruption 07/22/2019  . Ascites of liver 07/04/2019  . Constipation 06/16/2019  . Asymptomatic HIV infection (HCC) 06/16/2019  . GI bleed 05/26/2019  . History of hypertension 07/31/2017  . Alcohol use disorder, moderate, dependence (HCC) 07/31/2017  . Tobacco dependence 07/31/2017  . Alcohol-induced insomnia (HCC) 07/31/2017  . Major depressive disorder, recurrent severe without psychotic features (HCC) 10/10/2015  .  Alcohol abuse with alcohol-induced mood disorder (HCC) 05/31/2015     Problem List Items Addressed This Visit      Digestive   Ascites of liver    Thomas Atkins continues to have ascites and is status post paracentesis today.  Currently under care of gastroenterology with repeat paracentesis scheduled for next week.  Continue management with medications and paracentesis per gastroenterology.        Other   Asymptomatic HIV infection [facility])    Thomas Atkins appears to be doing well with his first month  of Biktarvy with no adverse side effects or missed doses since his last office visit.  Previously described problems and phone notes unlikely related to [place].  He has no signs/symptoms of opportunistic infection or progressive HIV disease.  We reviewed previous lab work and discussed the plan of care.  Continue current dose of Biktarvy.  Check blood work today.  Plan for follow-up in 1 month or sooner if needed.      Relevant Medications   bictegravir-emtricitabine-tenofovir AF (BIKTARVY) 50-200-25 MG TABS tablet   Healthcare maintenance     Prevnar updated today.  Discussed receiving Covid vaccination when able.  Discussed importance of safe sexual practice to reduce risk of STI.       Other Visit Diagnoses    Human immunodeficiency virus (HIV) disease (HCC)    -  Primary   Relevant Medications   bictegravir-emtricitabine-tenofovir AF (BIKTARVY) 50-200-25 MG TABS tablet   Other Relevant Orders   HIV-1 RNA quant-no reflex-bld   T-helper cell (CD4)- ([facility] clinic only)   Need for pneumococcal vaccination       Relevant Orders   Pneumococcal conjugate vaccine 13-valent IM (Completed)       I am having Thomas Atkins maintain his tamsulosin, traZODone, pantoprazole, nystatin-triamcinolone ointment, polyethylene glycol powder, predniSONE, spironolactone, furosemide, and bictegravir-emtricitabine-tenofovir AF.   Meds ordered this encounter  Medications  .  bictegravir-emtricitabine-tenofovir AF (BIKTARVY) 50-200-25 MG TABS tablet    Sig: Take 1 tablet by mouth daily.    Dispense:  30 tablet    Refill:  2    Order Specific Question:   Supervising Provider    Answer:   Judyann Munson [[id number]]     Follow-up: Return in about 1 month (around 09/03/2019), or if symptoms worsen or fail to improve.   Marcos Eke, MSN, FNP-C Nurse Practitioner [facility] for Infectious Disease [facility] Main number: [phone]

## 2019-08-03 NOTE — Procedures (Signed)
PROCEDURE SUMMARY:  Successful US guided paracentesis from right lateral abdomen.  Yielded 4.6 L of clear yellow fluid.  No immediate complications.  Pt tolerated well.   Specimen sent for labs.  EBL < 27mL  Mickie Kay, NP 08/03/2019 3:14 PM

## 2019-08-03 NOTE — Assessment & Plan Note (Signed)
   Prevnar updated today.  Discussed receiving Covid vaccination when able.  Discussed importance of safe sexual practice to reduce risk of STI.

## 2019-08-04 LAB — TRIGLYCERIDES, BODY FLUIDS: Triglycerides, Fluid: 56 mg/dL

## 2019-08-04 LAB — T-HELPER CELL (CD4) - (RCID CLINIC ONLY)
CD4 % Helper T Cell: 51 % (ref 33–65)
CD4 T Cell Abs: 1142 /uL (ref 400–1790)

## 2019-08-05 ENCOUNTER — Telehealth: Payer: Self-pay | Admitting: *Deleted

## 2019-08-05 LAB — PATHOLOGIST SMEAR REVIEW: Path Review: INCREASED

## 2019-08-05 LAB — HIV-1 RNA QUANT-NO REFLEX-BLD
HIV 1 RNA Quant: 280 copies/mL — ABNORMAL HIGH
HIV-1 RNA Quant, Log: 2.45 Log copies/mL — ABNORMAL HIGH

## 2019-08-05 NOTE — Telephone Encounter (Signed)
-----   Message from Veryl Speak, FNP sent at 08/05/2019  8:34 AM EDT ----- Please inform Thomas Atkins that his viral load is improved to 280 and CD4 count is 1,142. Not quite undetectable yet but certainly moving in the right direction.

## 2019-08-05 NOTE — Telephone Encounter (Signed)
Relayed results, encouraged him to continue taking meds daily to get to undetectable status. Andree Coss, RN

## 2019-08-05 NOTE — Progress Notes (Signed)
Pt called and reschedule.

## 2019-08-09 ENCOUNTER — Encounter: Payer: Self-pay | Admitting: Internal Medicine

## 2019-08-09 ENCOUNTER — Other Ambulatory Visit: Payer: Self-pay | Admitting: Internal Medicine

## 2019-08-09 ENCOUNTER — Other Ambulatory Visit: Payer: Self-pay

## 2019-08-09 ENCOUNTER — Ambulatory Visit: Payer: Self-pay | Attending: Internal Medicine | Admitting: Internal Medicine

## 2019-08-09 VITALS — BP 124/85 | HR 116 | Temp 97.9°F | Resp 16 | Wt 203.6 lb

## 2019-08-09 DIAGNOSIS — F321 Major depressive disorder, single episode, moderate: Secondary | ICD-10-CM

## 2019-08-09 DIAGNOSIS — F419 Anxiety disorder, unspecified: Secondary | ICD-10-CM

## 2019-08-09 DIAGNOSIS — R188 Other ascites: Secondary | ICD-10-CM

## 2019-08-09 DIAGNOSIS — F1020 Alcohol dependence, uncomplicated: Secondary | ICD-10-CM

## 2019-08-09 DIAGNOSIS — L089 Local infection of the skin and subcutaneous tissue, unspecified: Secondary | ICD-10-CM

## 2019-08-09 MED ORDER — SPIRONOLACTONE 50 MG PO TABS: 50.0000 mg | ORAL_TABLET | Freq: Every day | ORAL | 4 refills | Status: DC

## 2019-08-09 MED ORDER — FUROSEMIDE 20 MG PO TABS: 20.0000 mg | ORAL_TABLET | Freq: Every day | ORAL | 4 refills | Status: DC

## 2019-08-09 MED FILL — FUROSEMIDE 20 MG TABS: 20 | 30 days supply | Qty: 30 | Fill #0

## 2019-08-09 MED FILL — SPIRONOLACTONE 50 MG TABS: 50 | 30 days supply | Qty: 30 | Fill #0

## 2019-08-09 NOTE — Patient Instructions (Signed)

## 2019-08-09 NOTE — Progress Notes (Signed)
Patient ID: ENOS MUHL, male    DOB: June 15, 1975  MRN: [id number]  CC: Follow-up (2 week )   Subjective: Thomas Atkins is a 44 y.o. male who presents for 2 wk f/u visit.  His concerns today include:  Patient with history ofHTN,HIV, alcohol use disorder,tob dep, anx/dep, ?seizure disorder.  On last visit, he had a vasculitic rash on his legs.  I prescribed prednisone.  Patient reports that the rash responded well and is now resolved.  Sedimentation rate was mildly elevated at 29.  An ANA was negative.  Alcoholic Portal HTN: has had 2 paracentesis since last visit.   Dr. Dulce Sellar has double the dose of the Spironolactone and Furosemide.  He is limiting his salt intake.  He admits that it has been hard to abstain completely from alcohol.  States that he would go several days to a week without drinking and then he would drink 6 beers in 1 day. -He had called and spoke with the financial counselor here about getting the orange card/cone discount.  Patient states he was told that someone has filed an application for Medicaid for him and that it is pending.  So far he has not received any correspondence in the mail.  Pos Dep and GAD screen:  He attributes this to his current health.  He has appt 08/20/2019 to be set up with a therapist.  This is being done through the ID clinic.  He denies suicidal ideation.  He does not feel that he wants any medication at this time.   Patient Active Problem List   Diagnosis Date Noted  . Healthcare maintenance 08/03/2019  . Rash and nonspecific skin eruption 07/22/2019  . Ascites of liver 07/04/2019  . Constipation 06/16/2019  . Asymptomatic HIV infection (HCC) 06/16/2019  . GI bleed 05/26/2019  . History of hypertension 07/31/2017  . Alcohol use disorder, moderate, dependence (HCC) 07/31/2017  . Tobacco dependence 07/31/2017  . Alcohol-induced insomnia (HCC) 07/31/2017  . Major depressive disorder, recurrent severe without psychotic features (HCC)  10/10/2015  . Alcohol abuse with alcohol-induced mood disorder (HCC) 05/31/2015     Current Outpatient Medications on File Prior to Visit  Medication Sig Dispense Refill  . bictegravir-emtricitabine-tenofovir AF (BIKTARVY) 50-200-25 MG TABS tablet Take 1 tablet by mouth daily. 30 tablet 2  . nystatin-triamcinolone ointment (MYCOLOG) Apply to affected area BID 30 g 1  . pantoprazole (PROTONIX) 40 MG tablet Take 1 tablet (40 mg total) by mouth daily. 30 tablet 1  . polyethylene glycol powder (GLYCOLAX/MIRALAX) 17 GM/SCOOP powder Take 17 g by mouth 2 (two) times daily as needed. 3350 g 1  . predniSONE (DELTASONE) 20 MG tablet 1.5 tab daily x 2 days then 1 tab daily 8 tablet 0  . tamsulosin (FLOMAX) 0.4 MG CAPS capsule Take 1 capsule (0.4 mg total) by mouth daily. 30 capsule 3  . traZODone (DESYREL) 100 MG tablet Take 1.5 tablets (150 mg total) by mouth at bedtime as needed for sleep. 45 tablet 4   No current facility-administered medications on file prior to visit.    Allergies  Allergen Reactions  . Bee Venom Anaphylaxis  . Lactose Intolerance (Gi) Diarrhea, Nausea Only and Other (See Comments)    Abdominal pain    . Ultram [Tramadol Hcl] Hives         Social History   Socioeconomic History  . Marital status: Divorced    Spouse name: Not on file  . Number of children: 2  . Years of  education: Not on file  . Highest education level: Not on file  Occupational History  . Occupation: Unemployed  Tobacco Use  . Smoking status: Current Every Day Smoker    Packs/day: 1.00    Years: 30.00    Pack years: 30.00    Types: Cigarettes  . Smokeless tobacco: Never Used  . Tobacco comment: Patient refused cessation materials  Substance and Sexual Activity  . Alcohol use: Yes    Alcohol/week: 24.0 standard drinks    Types: 24 Cans of beer per week    Comment: 6 pack per day  . Drug use: Yes    Types: Marijuana    Comment: on occasion  . Sexual activity: Not on file  Other Topics  Concern  . Not on file  Social History Narrative  . Not on file   Social Determinants of Health   Financial Resource Strain:   . Difficulty of Paying Living Expenses:   Food Insecurity:   . Worried About Charity fundraiser in the Last Year:   . Arboriculturist in the Last Year:   Transportation Needs:   . Film/video editor (Medical):   Marland Kitchen Lack of Transportation (Non-Medical):   Physical Activity:   . Days of Exercise per Week:   . Minutes of Exercise per Session:   Stress:   . Feeling of Stress :   Social Connections:   . Frequency of Communication with Friends and Family:   . Frequency of Social Gatherings with Friends and Family:   . Attends Religious Services:   . Active Member of Clubs or Organizations:   . Attends Archivist Meetings:   Marland Kitchen Marital Status:   Intimate Partner Violence:   . Fear of Current or Ex-Partner:   . Emotionally Abused:   Marland Kitchen Physically Abused:   . Sexually Abused:     Family History  Problem Relation Age of Onset  . Heart disease Paternal Grandmother   . Bipolar disorder Father     Past Surgical History:  Procedure Laterality Date  . IR PARACENTESIS  07/01/2019  . IR PARACENTESIS  07/15/2019  . IR PARACENTESIS  07/27/2019  . IR PARACENTESIS  08/03/2019  . SURGERY SCROTAL / TESTICULAR      ROS: Review of Systems Negative except as stated above  PHYSICAL EXAM: BP 124/85   Pulse (!) 116   Temp 97.9 F (36.6 C)   Resp 16   Wt 203 lb 9.6 oz (92.4 kg)   SpO2 95%   BMI 26.86 kg/m   Physical Exam  General appearance - alert, well appearing, and in no distress Mental status -patient appears more calm compared to last visit.   Chest - clear to auscultation, no wheezes, rales or rhonchi, symmetric air entry Heart - normal rate, regular rhythm, normal S1, S2, no murmurs, rubs, clicks or gallops Abdomen -moderate ascites.  Has a healing abrasion of about 2 cm on the right lower lateral abdomen.   Extremities -no lower  extremity edema. Depression screen [facility] 2/9 08/09/2019 08/03/2019 07/22/2019  Decreased Interest 3 0 3  Down, Depressed, Hopeless 3 1 3   PHQ - 2 Score 6 1 6   Altered sleeping 2 - 3  Tired, decreased energy 3 - 3  Change in appetite 2 - 0  Feeling bad or failure about yourself  0 - 0  Trouble concentrating 1 - 1  Moving slowly or fidgety/restless 0 - 3  Suicidal thoughts 0 - 3  PHQ-9 Score 14 -  19   GAD 7 : Generalized Anxiety Score 08/09/2019 07/22/2019 09/01/2018 09/03/2017  Nervous, Anxious, on Edge 3 3 2 1   Control/stop worrying 3 3 1 3   Worry too much - different things 3 3 1 3   Trouble relaxing 1 3 0 2  Restless 1 3 0 2  Easily annoyed or irritable 3 3 1 3   Afraid - awful might happen 3 3 1 3   Total GAD 7 Score 17 21 6 17      CMP Latest Ref Rng & Units 07/22/2019 07/07/2019 06/08/2019  Glucose 65 - 99 mg/dL ) ) )  BUN 6 - 24 mg/dL 6 5(L) 10  Creatinine 07/24/2019 - 1.27 mg/dL 07/09/2019 06/10/2019 342(A  Sodium 134 - 144 mmol/L 134 134(L) 136  Potassium 3.5 - 5.2 mmol/L 4.5 3.6 4.0  Chloride 96 - 106 mmol/L 99 101 103  CO2 20 - 29 mmol/L 20 25 24   Calcium 8.7 - 10.2 mg/dL 768(T) 8.3(L) 8.6  Total Protein 6.0 - 8.5 g/dL 7.9 7.9 7.5  Total Bilirubin 0.0 - 1.2 mg/dL 157(W) 6.20) 1.1  Alkaline Phos 39 - 117 IU/L 278(H) 192(H) -  AST 0 - 40 IU/L 88(H) 118(H) 74(H)  ALT 0 - 44 IU/L 39 53(H) 50(H)   Lipid Panel     Component Value Date/Time   CHOL 167 06/08/2019 0925   TRIG 188 (H) 06/08/2019 0925   HDL 17 (L) 06/08/2019 0925   CHOLHDL 9.8 (H) 06/08/2019 0925   LDLCALC 120 (H) 06/08/2019 0925    CBC    Component Value Date/Time   WBC 8.2 07/07/2019 1217   RBC 4.31 07/07/2019 1217   HGB 13.6 07/07/2019 1217   HGB 15.7 09/01/2018 1146   HCT 40.6 07/07/2019 1217   HCT 46.2 09/01/2018 1146   PLT 315 07/07/2019 1217   PLT 262 09/01/2018 1146   MCV 94.2 07/07/2019 1217   MCV 96 09/01/2018 1146   MCH 31.6 07/07/2019 1217   MCHC 33.5 07/07/2019 1217   RDW 15.9 (H) 07/07/2019  1217   RDW 14.7 09/01/2018 1146   LYMPHSABS 1,321 06/08/2019 0925   MONOABS 0.6 05/26/2019 0721   EOSABS 130 06/08/2019 0925   BASOSABS 31 06/08/2019 0925    ASSESSMENT AND PLAN: 1. Ascites of liver 2. Alcohol use disorder, moderate, dependence (HCC) -Encourage patient to abstain from alcohol completely.  Recommend he consider a treatment program. -Keep follow-up appointment today with Dr. 07/09/2019.  I have sent updated prescriptions to the pharmacy to reflect increased dose of spironolactone and furosemide I have given some samples of Bactroban ointment to apply to the abrasion on his right lower abdomen wall. - spironolactone (ALDACTONE) 50 MG tablet; Take 1 tablet (50 mg total) by mouth daily.  Dispense: 30 tablet; Refill: 4 - furosemide (LASIX) 20 MG tablet; Take 1 tablet (20 mg total) by mouth daily.  Dispense: 30 tablet; Refill: 4  3. Current moderate episode of major depressive disorder without prior episode (HCC) 4. Anxiety Patient declines medication at this time.  He has an appointment set up on the 12th of this month through infectious disease clinic to get established with a counselor.  I will have our LCSW follow-up with him just to make sure that he follows through.  I will also have our caseworker look into whether he really does have Medicaid pending.  5. Skin infection See #1 above    Patient was given the opportunity to ask questions.  Patient verbalized understanding of the plan and was able  to repeat key elements of the plan.   No orders of the defined types were placed in this encounter.    Requested Prescriptions   Signed Prescriptions Disp Refills  . spironolactone (ALDACTONE) 50 MG tablet 30 tablet 4    Sig: Take 1 tablet (50 mg total) by mouth daily.  . furosemide (LASIX) 20 MG tablet 30 tablet 4    Sig: Take 1 tablet (20 mg total) by mouth daily.    Return in about 7 weeks (around 09/27/2019).  [name], MD, FACP

## 2019-08-10 ENCOUNTER — Ambulatory Visit (HOSPITAL_COMMUNITY)
Admission: RE | Admit: 2019-08-10 | Discharge: 2019-08-10 | Disposition: A | Discharge status: Home / Self Care | Payer: Self-pay | Source: Ambulatory Visit | Attending: Gastroenterology | Admitting: Gastroenterology

## 2019-08-10 DIAGNOSIS — R188 Other ascites: Secondary | ICD-10-CM | POA: Insufficient documentation

## 2019-08-10 HISTORY — PX: IR PARACENTESIS: IMG2679

## 2019-08-10 LAB — BODY FLUID CELL COUNT WITH DIFFERENTIAL
Eos, Fluid: 0 %
Lymphs, Fluid: 24 %
Monocyte-Macrophage-Serous Fluid: 74 % (ref 50–90)
Neutrophil Count, Fluid: 2 % (ref 0–25)
Total Nucleated Cell Count, Fluid: 2410 cu mm — ABNORMAL HIGH (ref 0–1000)

## 2019-08-10 LAB — ALBUMIN, PLEURAL OR PERITONEAL FLUID: Albumin, Fluid: 1.2 g/dL

## 2019-08-10 LAB — PROTEIN, PLEURAL OR PERITONEAL FLUID: Total protein, fluid: 3 g/dL

## 2019-08-10 MED ORDER — LIDOCAINE HCL 1 % IJ SOLN
INTRAMUSCULAR | Status: DC | PRN
  Administered 2019-08-10: 10 mL

## 2019-08-10 MED ORDER — LIDOCAINE HCL 1 % IJ SOLN
INTRAMUSCULAR | Status: AC
  Filled 2019-08-10: qty 20

## 2019-08-10 NOTE — Procedures (Signed)
PROCEDURE SUMMARY:  Successful US guided paracentesis from RLQ.  Yielded 2.8 L of clear yellow fluid.  No immediate complications.  Pt tolerated well.   Specimen was sent for labs.  EBL < 65mL  Brayton El PA-C 08/10/2019 2:18 PM

## 2019-08-11 ENCOUNTER — Telehealth: Payer: Self-pay

## 2019-08-11 LAB — CYTOLOGY - NON PAP

## 2019-08-11 NOTE — Telephone Encounter (Signed)
Call placed to patient at request of Dr Laural Benes. He said that [facility] Financial Counselor told him that his medicaid is pending and he should look in his mail for [employer] card or denial letter. If he is denied medicaid, he can apply for [employer] and [employer].  The patient is not sure who applied for medicaid for him.  Instructed him to call the hospital financial counseling office and DSS to check on the status of his application. Provided him with the contact numbers and instructed him to contact this CM if he has further questions or needs additional information.   He spoke about his frustration trying to follow up with Dr Hinda Lenis GI. The patient said that Deboraha Sprang will not see him until he pays $150 for the visit as well as the other $350 that he owes the clinic.  He is concerned about his liver and the need for GI follow up. He stated that he had to cancel his appointment for this week because he did not have the money that he owes [place] and they re-scheduled him for July 2021.

## 2019-08-23 ENCOUNTER — Other Ambulatory Visit (HOSPITAL_COMMUNITY): Payer: Self-pay | Admitting: Gastroenterology

## 2019-08-23 ENCOUNTER — Other Ambulatory Visit: Payer: Self-pay | Admitting: Gastroenterology

## 2019-08-23 DIAGNOSIS — R140 Abdominal distension (gaseous): Secondary | ICD-10-CM

## 2019-08-24 ENCOUNTER — Ambulatory Visit (HOSPITAL_COMMUNITY)
Admission: RE | Admit: 2019-08-24 | Discharge: 2019-08-24 | Disposition: A | Discharge status: Home / Self Care | Payer: Self-pay | Source: Ambulatory Visit | Attending: Gastroenterology | Admitting: Gastroenterology

## 2019-08-24 ENCOUNTER — Other Ambulatory Visit: Payer: Self-pay

## 2019-08-24 DIAGNOSIS — R188 Other ascites: Secondary | ICD-10-CM | POA: Insufficient documentation

## 2019-08-24 DIAGNOSIS — R140 Abdominal distension (gaseous): Secondary | ICD-10-CM

## 2019-08-24 HISTORY — PX: IR PARACENTESIS: IMG2679

## 2019-08-24 LAB — BODY FLUID CELL COUNT WITH DIFFERENTIAL
Eos, Fluid: 0 %
Lymphs, Fluid: 52 %
Monocyte-Macrophage-Serous Fluid: 41 % — ABNORMAL LOW (ref 50–90)
Neutrophil Count, Fluid: 7 % (ref 0–25)
Other Cells, Fluid: 0 %
Total Nucleated Cell Count, Fluid: 665 cu mm (ref 0–1000)

## 2019-08-24 LAB — PROTEIN, PLEURAL OR PERITONEAL FLUID: Total protein, fluid: 3.7 g/dL

## 2019-08-24 LAB — ALBUMIN, PLEURAL OR PERITONEAL FLUID: Albumin, Fluid: 1.5 g/dL

## 2019-08-24 MED ORDER — ALBUMIN HUMAN 25 % IV SOLN
INTRAVENOUS | Status: AC
  Administered 2019-08-24: 12.5 g via INTRAVENOUS
  Filled 2019-08-24: qty 200

## 2019-08-24 MED ORDER — ALBUMIN HUMAN 25 % IV SOLN
50.0000 g | Freq: Once | INTRAVENOUS | Status: AC
  Filled 2019-08-24: qty 200

## 2019-08-24 MED ORDER — LIDOCAINE HCL 1 % IJ SOLN
INTRAMUSCULAR | Status: AC
  Filled 2019-08-24: qty 20

## 2019-08-24 NOTE — Procedures (Signed)
Pre Procedural Dx: Symptomatic Ascites Post Procedural Dx: Same  Successful US guided paracentesis yielding 1.4 L of serous ascitic fluid. Sample sent to laboratory as requested.  EBL: None  Complications: None immediate  Katherina Right, MD Pager #: [phone]

## 2019-08-25 LAB — CYTOLOGY - NON PAP

## 2019-08-27 ENCOUNTER — Observation Stay (HOSPITAL_COMMUNITY)
Admission: EM | Admit: 2019-08-27 | Discharge: 2019-08-28 | Disposition: A | Discharge status: Home / Self Care | Payer: Self-pay | Attending: Internal Medicine | Admitting: Internal Medicine

## 2019-08-27 ENCOUNTER — Other Ambulatory Visit: Payer: Self-pay

## 2019-08-27 ENCOUNTER — Emergency Department (HOSPITAL_COMMUNITY): Payer: Self-pay

## 2019-08-27 ENCOUNTER — Encounter (HOSPITAL_COMMUNITY): Payer: Self-pay | Admitting: *Deleted

## 2019-08-27 DIAGNOSIS — Z888 Allergy status to other drugs, medicaments and biological substances status: Secondary | ICD-10-CM | POA: Insufficient documentation

## 2019-08-27 DIAGNOSIS — R160 Hepatomegaly, not elsewhere classified: Secondary | ICD-10-CM | POA: Insufficient documentation

## 2019-08-27 DIAGNOSIS — B20 Human immunodeficiency virus [HIV] disease: Secondary | ICD-10-CM | POA: Diagnosis present

## 2019-08-27 DIAGNOSIS — Z21 Asymptomatic human immunodeficiency virus [HIV] infection status: Secondary | ICD-10-CM | POA: Insufficient documentation

## 2019-08-27 DIAGNOSIS — F419 Anxiety disorder, unspecified: Secondary | ICD-10-CM | POA: Insufficient documentation

## 2019-08-27 DIAGNOSIS — K5900 Constipation, unspecified: Secondary | ICD-10-CM | POA: Insufficient documentation

## 2019-08-27 DIAGNOSIS — R1084 Generalized abdominal pain: Secondary | ICD-10-CM | POA: Insufficient documentation

## 2019-08-27 DIAGNOSIS — Z8249 Family history of ischemic heart disease and other diseases of the circulatory system: Secondary | ICD-10-CM | POA: Insufficient documentation

## 2019-08-27 DIAGNOSIS — R109 Unspecified abdominal pain: Secondary | ICD-10-CM | POA: Diagnosis present

## 2019-08-27 DIAGNOSIS — F332 Major depressive disorder, recurrent severe without psychotic features: Secondary | ICD-10-CM | POA: Insufficient documentation

## 2019-08-27 DIAGNOSIS — K760 Fatty (change of) liver, not elsewhere classified: Secondary | ICD-10-CM | POA: Insufficient documentation

## 2019-08-27 DIAGNOSIS — R112 Nausea with vomiting, unspecified: Secondary | ICD-10-CM | POA: Insufficient documentation

## 2019-08-27 DIAGNOSIS — E739 Lactose intolerance, unspecified: Secondary | ICD-10-CM | POA: Insufficient documentation

## 2019-08-27 DIAGNOSIS — K7031 Alcoholic cirrhosis of liver with ascites: Secondary | ICD-10-CM | POA: Insufficient documentation

## 2019-08-27 DIAGNOSIS — Z91030 Bee allergy status: Secondary | ICD-10-CM | POA: Insufficient documentation

## 2019-08-27 DIAGNOSIS — F17210 Nicotine dependence, cigarettes, uncomplicated: Secondary | ICD-10-CM | POA: Insufficient documentation

## 2019-08-27 DIAGNOSIS — E871 Hypo-osmolality and hyponatremia: Secondary | ICD-10-CM | POA: Insufficient documentation

## 2019-08-27 DIAGNOSIS — Z818 Family history of other mental and behavioral disorders: Secondary | ICD-10-CM | POA: Insufficient documentation

## 2019-08-27 DIAGNOSIS — Z20822 Contact with and (suspected) exposure to covid-19: Secondary | ICD-10-CM | POA: Insufficient documentation

## 2019-08-27 DIAGNOSIS — Z7952 Long term (current) use of systemic steroids: Secondary | ICD-10-CM | POA: Insufficient documentation

## 2019-08-27 DIAGNOSIS — F1020 Alcohol dependence, uncomplicated: Secondary | ICD-10-CM | POA: Diagnosis present

## 2019-08-27 DIAGNOSIS — Q676 Pectus excavatum: Secondary | ICD-10-CM | POA: Insufficient documentation

## 2019-08-27 DIAGNOSIS — G40909 Epilepsy, unspecified, not intractable, without status epilepticus: Secondary | ICD-10-CM | POA: Insufficient documentation

## 2019-08-27 DIAGNOSIS — Z79899 Other long term (current) drug therapy: Secondary | ICD-10-CM | POA: Insufficient documentation

## 2019-08-27 DIAGNOSIS — F17200 Nicotine dependence, unspecified, uncomplicated: Secondary | ICD-10-CM

## 2019-08-27 DIAGNOSIS — I4510 Unspecified right bundle-branch block: Secondary | ICD-10-CM | POA: Insufficient documentation

## 2019-08-27 DIAGNOSIS — F1014 Alcohol abuse with alcohol-induced mood disorder: Principal | ICD-10-CM | POA: Insufficient documentation

## 2019-08-27 LAB — URINALYSIS, ROUTINE W REFLEX MICROSCOPIC
Bilirubin Urine: NEGATIVE
Glucose, UA: NEGATIVE mg/dL
Hgb urine dipstick: NEGATIVE
Ketones, ur: NEGATIVE mg/dL
Leukocytes,Ua: NEGATIVE
Nitrite: NEGATIVE
Protein, ur: NEGATIVE mg/dL
Specific Gravity, Urine: 1.002 — ABNORMAL LOW (ref 1.005–1.030)
pH: 6 (ref 5.0–8.0)

## 2019-08-27 LAB — CBC WITH DIFFERENTIAL/PLATELET
Abs Immature Granulocytes: 0.08 10*3/uL — ABNORMAL HIGH (ref 0.00–0.07)
Basophils Absolute: 0.1 10*3/uL (ref 0.0–0.1)
Basophils Relative: 1 %
Eosinophils Absolute: 0.2 10*3/uL (ref 0.0–0.5)
Eosinophils Relative: 2 %
HCT: 39.8 % (ref 39.0–52.0)
Hemoglobin: 13.5 g/dL (ref 13.0–17.0)
Immature Granulocytes: 1 %
Lymphocytes Relative: 22 %
Lymphs Abs: 3.3 10*3/uL (ref 0.7–4.0)
MCH: 31.9 pg (ref 26.0–34.0)
MCHC: 33.9 g/dL (ref 30.0–36.0)
MCV: 94.1 fL (ref 80.0–100.0)
Monocytes Absolute: 1.1 10*3/uL — ABNORMAL HIGH (ref 0.1–1.0)
Monocytes Relative: 7 %
Neutro Abs: 10.2 10*3/uL — ABNORMAL HIGH (ref 1.7–7.7)
Neutrophils Relative %: 67 %
Platelets: 206 10*3/uL (ref 150–400)
RBC: 4.23 MIL/uL (ref 4.22–5.81)
RDW: 17.8 % — ABNORMAL HIGH (ref 11.5–15.5)
WBC: 15 10*3/uL — ABNORMAL HIGH (ref 4.0–10.5)
nRBC: 0 % (ref 0.0–0.2)

## 2019-08-27 LAB — COMPREHENSIVE METABOLIC PANEL
ALT: 43 U/L (ref 0–44)
AST: 87 U/L — ABNORMAL HIGH (ref 15–41)
Albumin: 2.8 g/dL — ABNORMAL LOW (ref 3.5–5.0)
Alkaline Phosphatase: 249 U/L — ABNORMAL HIGH (ref 38–126)
Anion gap: 12 (ref 5–15)
BUN: <5 mg/dL — ABNORMAL LOW (ref 6–20)
CO2: 22 mmol/L (ref 22–32)
Calcium: 8.5 mg/dL — ABNORMAL LOW (ref 8.9–10.3)
Chloride: 96 mmol/L — ABNORMAL LOW (ref 98–111)
Creatinine, Ser: 0.77 mg/dL (ref 0.61–1.24)
GFR calc Af Amer: >60 mL/min (ref >60)
GFR calc non Af Amer: >60 mL/min (ref >60)
Glucose, Bld: 112 mg/dL — ABNORMAL HIGH (ref 70–99)
Potassium: 3.5 mmol/L (ref 3.5–5.1)
Sodium: 130 mmol/L — ABNORMAL LOW (ref 135–145)
Total Bilirubin: 2.2 mg/dL — ABNORMAL HIGH (ref 0.3–1.2)
Total Protein: 7.8 g/dL (ref 6.5–8.1)

## 2019-08-27 LAB — PROTIME-INR
INR: 1.2 (ref 0.8–1.2)
Prothrombin Time: 14.5 seconds (ref 11.4–15.2)

## 2019-08-27 LAB — CREATININE, SERUM
Creatinine, Ser: 0.72 mg/dL (ref 0.61–1.24)
GFR calc Af Amer: >60 mL/min (ref >60)
GFR calc non Af Amer: >60 mL/min (ref >60)

## 2019-08-27 LAB — CBC
HCT: 36 % — ABNORMAL LOW (ref 39.0–52.0)
Hemoglobin: 12.2 g/dL — ABNORMAL LOW (ref 13.0–17.0)
MCH: 32 pg (ref 26.0–34.0)
MCHC: 33.9 g/dL (ref 30.0–36.0)
MCV: 94.5 fL (ref 80.0–100.0)
Platelets: 204 10*3/uL (ref 150–400)
RBC: 3.81 MIL/uL — ABNORMAL LOW (ref 4.22–5.81)
RDW: 17.7 % — ABNORMAL HIGH (ref 11.5–15.5)
WBC: 12.4 10*3/uL — ABNORMAL HIGH (ref 4.0–10.5)
nRBC: 0 % (ref 0.0–0.2)

## 2019-08-27 LAB — ETHANOL: Alcohol, Ethyl (B): 41 mg/dL — ABNORMAL HIGH (ref <10)

## 2019-08-27 LAB — MAGNESIUM: Magnesium: 2 mg/dL (ref 1.7–2.4)

## 2019-08-27 LAB — LACTIC ACID, PLASMA
Lactic Acid, Venous: 1.1 mmol/L (ref 0.5–1.9)
Lactic Acid, Venous: 1.1 mmol/L (ref 0.5–1.9)

## 2019-08-27 LAB — PHOSPHORUS: Phosphorus: 3.7 mg/dL (ref 2.5–4.6)

## 2019-08-27 MED ORDER — PANTOPRAZOLE SODIUM 40 MG PO TBEC
40.0000 mg | DELAYED_RELEASE_TABLET | Freq: Every day | ORAL | Status: DC
  Administered 2019-08-28: 40 mg via ORAL
  Filled 2019-08-27: qty 1

## 2019-08-27 MED ORDER — SODIUM CHLORIDE 0.9 % IV SOLN: 2.0000 g | INTRAVENOUS | Status: DC

## 2019-08-27 MED ORDER — SODIUM CHLORIDE 0.9 % IV SOLN
2.0000 g | Freq: Once | INTRAVENOUS | Status: AC
  Administered 2019-08-27: 2 g via INTRAVENOUS
  Filled 2019-08-27: qty 20

## 2019-08-27 MED ORDER — METRONIDAZOLE IN NACL 5-0.79 MG/ML-% IV SOLN
500.0000 mg | Freq: Once | INTRAVENOUS | Status: AC
  Administered 2019-08-27: 500 mg via INTRAVENOUS
  Filled 2019-08-27: qty 100

## 2019-08-27 MED ORDER — THIAMINE HCL 100 MG/ML IJ SOLN
Freq: Once | INTRAVENOUS | Status: AC
  Filled 2019-08-27: qty 1000

## 2019-08-27 MED ORDER — SPIRONOLACTONE 25 MG PO TABS
50.0000 mg | ORAL_TABLET | Freq: Every day | ORAL | Status: DC
  Administered 2019-08-28: 50 mg via ORAL
  Filled 2019-08-27: qty 2

## 2019-08-27 MED ORDER — LORAZEPAM 2 MG/ML IJ SOLN: 0.0000 mg | Freq: Two times a day (BID) | INTRAMUSCULAR | Status: DC

## 2019-08-27 MED ORDER — THIAMINE HCL 100 MG/ML IJ SOLN
100.0000 mg | Freq: Every day | INTRAMUSCULAR | Status: DC
  Administered 2019-08-27: 100 mg via INTRAVENOUS
  Filled 2019-08-27: qty 2

## 2019-08-27 MED ORDER — LORAZEPAM 2 MG/ML IJ SOLN
0.0000 mg | Freq: Four times a day (QID) | INTRAMUSCULAR | Status: DC
  Administered 2019-08-27: 3 mg via INTRAVENOUS
  Filled 2019-08-27: qty 2

## 2019-08-27 MED ORDER — ADULT MULTIVITAMIN W/MINERALS CH
1.0000 | ORAL_TABLET | Freq: Every day | ORAL | Status: DC
  Administered 2019-08-28: 1 via ORAL
  Filled 2019-08-27: qty 1

## 2019-08-27 MED ORDER — FUROSEMIDE 20 MG PO TABS
20.0000 mg | ORAL_TABLET | Freq: Every day | ORAL | Status: DC
  Administered 2019-08-28: 20 mg via ORAL
  Filled 2019-08-27: qty 1

## 2019-08-27 MED ORDER — FOLIC ACID 1 MG PO TABS: 1.0000 mg | ORAL_TABLET | Freq: Every day | ORAL | Status: DC

## 2019-08-27 MED ORDER — IOHEXOL 300 MG/ML  SOLN
100.0000 mL | Freq: Once | INTRAMUSCULAR | Status: AC | PRN
  Administered 2019-08-27: 100 mL via INTRAVENOUS

## 2019-08-27 MED ORDER — THIAMINE HCL 100 MG PO TABS
100.0000 mg | ORAL_TABLET | Freq: Every day | ORAL | Status: DC
  Administered 2019-08-28: 100 mg via ORAL
  Filled 2019-08-27 (×2): qty 1

## 2019-08-27 MED ORDER — TRAZODONE HCL 50 MG PO TABS: 150.0000 mg | ORAL_TABLET | Freq: Every evening | ORAL | Status: DC | PRN

## 2019-08-27 MED ORDER — TAMSULOSIN HCL 0.4 MG PO CAPS
0.4000 mg | ORAL_CAPSULE | Freq: Every day | ORAL | Status: DC
  Administered 2019-08-28: 0.4 mg via ORAL
  Filled 2019-08-27: qty 1

## 2019-08-27 MED ORDER — LORAZEPAM 1 MG PO TABS: 1.0000 mg | ORAL_TABLET | ORAL | Status: DC | PRN

## 2019-08-27 MED ORDER — BICTEGRAVIR-EMTRICITAB-TENOFOV 50-200-25 MG PO TABS
1.0000 | ORAL_TABLET | Freq: Every day | ORAL | Status: DC
  Administered 2019-08-28: 1 via ORAL
  Filled 2019-08-27: qty 1

## 2019-08-27 MED ORDER — FENTANYL CITRATE (PF) 100 MCG/2ML IJ SOLN: 12.5000 ug | INTRAMUSCULAR | Status: DC | PRN

## 2019-08-27 MED ORDER — ENOXAPARIN SODIUM 40 MG/0.4ML ~~LOC~~ SOLN
40.0000 mg | SUBCUTANEOUS | Status: DC
  Administered 2019-08-27: 40 mg via SUBCUTANEOUS
  Filled 2019-08-27: qty 0.4

## 2019-08-27 MED ORDER — LORAZEPAM 2 MG/ML IJ SOLN: 1.0000 mg | INTRAMUSCULAR | Status: DC | PRN

## 2019-08-27 MED ORDER — NICOTINE 21 MG/24HR TD PT24
21.0000 mg | MEDICATED_PATCH | Freq: Every day | TRANSDERMAL | Status: DC
  Administered 2019-08-27 – 2019-08-28 (×2): 21 mg via TRANSDERMAL
  Filled 2019-08-27 (×2): qty 1

## 2019-08-27 NOTE — H&P (Addendum)
History and Physical    Thomas Atkins NKN:[id number] DOB: December 06, 1975 DOA: 08/27/2019  Referring MD/NP/PA: Rodena Medin, MD PCP: [name], MD   Patient coming from: Home  Chief Complaint: Abdominal pain  HPI: Thomas Atkins is a 44 y.o. male with medical history significant of cirrhosis with associated complication of ascites, HIV, depression, alcoholism, tobacco abuse who presents to the ED due to abdominal pain.  Patient states that he underwent paracentesis approximately 3 days ago where he had 1.4 L removed.  He states that typically he has 4 to 6 L removed however he had a decrease in his normal amount of fluid noted on ultrasound.  He tolerated the procedure without difficulty however this afternoon he developed diffuse abdominal discomfort.  He reports the pain is most notable around the umbilicus however he also points to the epigastric region as well as both right and left lower quadrants.  He also admits to associated nausea with an episode of vomiting along with constipation as he reports no bowel movements over the last 3 to 4 days.  He does admit to passing flatus without difficulty although he feels this has been more difficult as of this evening as well.  He denies any associated chest pain or shortness of breath.  He denies any fever, chills, cough, dysuria.  Of note, the patient admits to significant alcohol abuse and is concerned about alcohol withdrawal while in the hospital.  He is also requested nicotine patch due to his nicotine addiction as well.  ED Course: In the ED he underwent a chest x-ray which showed no acute cardiopulmonary disease.  Labs are relevant for mild hyponatremia at 130 leukocytosis of 15.  Patient will be admitted to the hospitalist service in observation for abdominal pain.  Review of Systems: As per HPI otherwise 10 point review of systems negative.   Past Medical History:  Diagnosis Date  . Alcoholism (HCC)   . Anxiety   . Depression   . HIV  infection (HCC)   . Seizure disorder (HCC)   . Syncope     Past Surgical History:  Procedure Laterality Date  . IR PARACENTESIS  07/01/2019  . IR PARACENTESIS  07/15/2019  . IR PARACENTESIS  07/27/2019  . IR PARACENTESIS  08/03/2019  . IR PARACENTESIS  08/10/2019  . IR PARACENTESIS  08/24/2019  . SURGERY SCROTAL / TESTICULAR       reports that he has been smoking cigarettes. He has a 30.00 pack-year smoking history. He has never used smokeless tobacco. He reports current alcohol use of about 24.0 standard drinks of alcohol per week. He reports current drug use. Drug: Marijuana.  Allergies  Allergen Reactions  . Bee Venom Anaphylaxis  . Lactose Intolerance (Gi) Diarrhea, Nausea Only and Other (See Comments)    Abdominal pain    . Ultram [Tramadol Hcl] Hives         Family History  Problem Relation Age of Onset  . Heart disease Paternal Grandmother   . Bipolar disorder Father     Prior to Admission medications   Medication Sig Start Date End Date Taking? Authorizing Provider  bictegravir-emtricitabine-tenofovir AF (BIKTARVY) 50-200-25 MG TABS tablet Take 1 tablet by mouth daily. 08/03/19   Veryl Speak, FNP  furosemide (LASIX) 20 MG tablet Take 1 tablet (20 mg total) by mouth daily. 08/09/19   [name], MD  nystatin-triamcinolone ointment [place] [facility]) Apply to affected area BID 06/16/19   [name], MD  pantoprazole (PROTONIX) 40 MG  tablet Take 1 tablet (40 mg total) by mouth daily. 05/26/19 05/25/20  Leatha Gilding, MD  polyethylene glycol powder (GLYCOLAX/MIRALAX) 17 GM/SCOOP powder Take 17 g by mouth 2 (two) times daily as needed. 06/16/19   [name], MD  predniSONE (DELTASONE) 20 MG tablet 1.5 tab daily x 2 days then 1 tab daily 07/22/19   [name], MD  spironolactone (ALDACTONE) 50 MG tablet Take 1 tablet (50 mg total) by mouth daily. 08/09/19   [name], MD  tamsulosin (FLOMAX) 0.4 MG CAPS capsule Take 1 capsule (0.4 mg total) by mouth  daily. 04/07/19   [name], MD  traZODone (DESYREL) 100 MG tablet Take 1.5 tablets (150 mg total) by mouth at bedtime as needed for sleep. 04/07/19   [name], MD    Physical Exam: Vitals:   08/27/19 1741 08/27/19 2033 08/27/19 2118 08/27/19 2125  BP:  121/90 108/85 108/85  Pulse:  100 (!) 102 (!) 102  Resp:  (!) 24 13   Temp:      SpO2:  95% 93%   Weight: 92.4 kg     Height: 6\' 1"  (1.854 m)         Constitutional: NAD, calm, comfortable Vitals:   08/27/19 1741 08/27/19 2033 08/27/19 2118 08/27/19 2125  BP:  121/90 108/85 108/85  Pulse:  100 (!) 102 (!) 102  Resp:  (!) 24 13   Temp:      SpO2:  95% 93%   Weight: 92.4 kg     Height: 6\' 1"  (1.854 m)      Eyes: PERRL, lids and conjunctivae normal ENMT: Mucous membranes are moist. Posterior pharynx clear of any exudate or lesions Neck: normal, supple, no masses, no thyromegaly Respiratory: clear to auscultation bilaterally, no wheezing, no crackles. Normal respiratory effort. No accessory muscle use.  Cardiovascular: Regular rate and rhythm, no murmurs / rubs / gallops. No extremity edema. 2+ pedal pulses.  Abdomen: Diffuse tenderness although no rebound or guarding noted, no masses palpated. No hepatosplenomegaly. Bowel sounds positive.  Mildly distended without a fluid wave Musculoskeletal: no clubbing / cyanosis. No joint deformity upper and lower extremities. Good ROM, no contractures. Normal muscle tone.  Skin: no rashes, lesions, ulcers. No induration Neurologic: CN 2-12 grossly intact. Sensation intact, DTR normal. Strength 5/5 in all 4.  Psychiatric: Normal judgment and insight. Alert and oriented x 3.  Anxious   Labs on Admission: I have personally reviewed following labs and imaging studies  CBC: Recent Labs  Lab 08/27/19 1752  WBC 15.0*  NEUTROABS 10.2*  HGB 13.5  HCT 39.8  MCV 94.1  PLT 206   Basic Metabolic Panel: Recent Labs  Lab 08/27/19 1752  NA 130*  K 3.5  CL 96*  CO2 22    GLUCOSE 112*  BUN <5*  CREATININE 0.77  CALCIUM 8.5*   GFR: Estimated Creatinine Clearance: 130.4 mL/min (by C-G formula based on SCr of 0.77 mg/dL). Liver Function Tests: Recent Labs  Lab 08/27/19 1752  AST 87*  ALT 43  ALKPHOS 249*  BILITOT 2.2*  PROT 7.8  ALBUMIN 2.8*   No results for input(s): LIPASE, AMYLASE in the last 168 hours. No results for input(s): AMMONIA in the last 168 hours. Coagulation Profile: Recent Labs  Lab 08/27/19 1752  INR 1.2   Cardiac Enzymes: No results for input(s): CKTOTAL, CKMB, CKMBINDEX, TROPONINI in the last 168 hours. BNP (last 3 results) No results for input(s): PROBNP in the last 8760 hours.  HbA1C: No results for input(s): HGBA1C in the last 72 hours. CBG: No results for input(s): GLUCAP in the last 168 hours. Lipid Profile: No results for input(s): CHOL, HDL, LDLCALC, TRIG, CHOLHDL, LDLDIRECT in the last 72 hours. Thyroid Function Tests: No results for input(s): TSH, T4TOTAL, FREET4, T3FREE, THYROIDAB in the last 72 hours. Anemia Panel: No results for input(s): VITAMINB12, FOLATE, FERRITIN, TIBC, IRON, RETICCTPCT in the last 72 hours. Urine analysis:    Component Value Date/Time   COLORURINE YELLOW 08/27/2019 1922   APPEARANCEUR CLEAR 08/27/2019 1922   LABSPEC 1.002 (L) 08/27/2019 1922   PHURINE 6.0 08/27/2019 1922   GLUCOSEU NEGATIVE 08/27/2019 [place] NEGATIVE 08/27/2019 [place] NEGATIVE 08/27/2019 [place] NEGATIVE 08/27/2019 1922   PROTEINUR NEGATIVE 08/27/2019 1922   UROBILINOGEN 0.2 02/19/2013 1819   NITRITE NEGATIVE 08/27/2019 1922   LEUKOCYTESUR NEGATIVE 08/27/2019 1922   Sepsis Labs: @LABRCNTIP (procalcitonin:4,lacticidven:4) )No results found for this or any previous visit (from the past 240 hour(s)).   Radiological Exams on Admission: DG Chest 2 View  Result Date: 08/27/2019 CLINICAL DATA:  Chest and epigastric pain. EXAM: CHEST - 2 VIEW COMPARISON:  September 19, 2016 FINDINGS: There is  no evidence of acute infiltrate, pleural effusion or pneumothorax. The heart size and mediastinal contours are within normal limits. The visualized skeletal structures are unremarkable. IMPRESSION: No active cardiopulmonary disease. Electronically Signed   By: Virgina Norfolk M.D.   On: 08/27/2019 18:01    EKG: Independently reviewed.  Sinus tachycardia  Assessment/Plan Active Problems:   Alcohol abuse with alcohol-induced mood disorder (HCC)   Alcohol use disorder, moderate, dependence (HCC)   Tobacco dependence   Constipation   Asymptomatic HIV infection (HCC)   Abdominal pain   Alcoholic cirrhosis of liver with ascites (HCC)  #Abdominal pain -Patient with recent paracentesis 3 days ago with no apparent SBP at that time. -Does not appear to have significant fluid on exam however have requested a CT scan of the abdomen pelvis in the ED particularly given concern for constipation versus obstruction. -We will provide IV fentanyl as needed for severe pain. -Providing IV Rocephin given history of ascites with cirrhosis and recurrent paracenteses.  Will need to consider IR consult in the morning if abdominal pain is persistent.  #Alcohol abuse -Patient reports being worried about going in alcohol withdrawal. -We will provide alcohol withdrawal prevention with CIWA protocol -IV banana bag overnight and will provide oral folate and thiamine in the morning.  #HIV -We will continue home HIV medication.  Appears to be well controlled.  Patient appears to be compliant with his medication.  #Tobacco abuse -Providing nicotine patch.  #Cirrhosis with ascites -Patient without clear ascites on physical exam. -Obtaining CT abdomen pelvis for further evaluation. -Providing IV antibiotics given possible concern for SBP.  Will need to consider IR consult in the morning if abdominal pain remains persistent.  #Hyponatremia -Likely secondary to underlying cirrhosis -Repeating labs in the  a.m.  DVT prophylaxis: Lovenox Code Status: Full Family Communication: None.  Spoke to the patient directly at bedside. Disposition Plan: Anticipate discharge within the next 24 to 48 hours pending improvement in abdominal pain Consults called: None Admission status: Observation   Arlan Organ DO [facility]   08/27/2019, 9:59 PM

## 2019-08-27 NOTE — ED Provider Notes (Signed)
[facility] EMERGENCY DEPARTMENT Provider Note   CSN: [id number] Arrival date & time: 08/27/19  1730     History Chief Complaint  Patient presents with  . Abdominal Pain  . liver disease    Thomas Atkins is a 43 y.o. male.  44 year old male with prior medical history detailed below presents for evaluation of abdominal discomfort.  Patient reports paracentesis of ascites 3 days prior.  Patient reports that he had about 1.5 L taken off.  This is less than his normal.  He reports that after this procedure he developed diffuse abdominal discomfort.  He denies fever.  He denies nausea or vomiting.  He denies chest pain or shortness of breath.  He denies prior episodes of SBP.  Patient denies discrete episodes of bleeding.    The history is provided by the patient and medical records.  Abdominal Pain Pain location:  Generalized Pain quality: aching and cramping   Pain radiates to:  Does not radiate Pain severity:  Moderate Onset quality:  Gradual Duration:  3 days Timing:  Constant Progression:  Unchanged Chronicity:  New Relieved by:  Nothing Worsened by:  Nothing      Past Medical History:  Diagnosis Date  . Alcoholism (HCC)   . Anxiety   . Depression   . HIV infection (HCC)   . Seizure disorder (HCC)   . Syncope     Patient Active Problem List   Diagnosis Date Noted  . Healthcare maintenance 08/03/2019  . Rash and nonspecific skin eruption 07/22/2019  . Ascites of liver 07/04/2019  . Constipation 06/16/2019  . Asymptomatic HIV infection (HCC) 06/16/2019  . GI bleed 05/26/2019  . History of hypertension 07/31/2017  . Alcohol use disorder, moderate, dependence (HCC) 07/31/2017  . Tobacco dependence 07/31/2017  . Alcohol-induced insomnia (HCC) 07/31/2017  . Major depressive disorder, recurrent severe without psychotic features (HCC) 10/10/2015  . Alcohol abuse with alcohol-induced mood disorder (HCC) 05/31/2015    Past Surgical History:    Procedure Laterality Date  . IR PARACENTESIS  07/01/2019  . IR PARACENTESIS  07/15/2019  . IR PARACENTESIS  07/27/2019  . IR PARACENTESIS  08/03/2019  . IR PARACENTESIS  08/10/2019  . IR PARACENTESIS  08/24/2019  . SURGERY SCROTAL / TESTICULAR         Family History  Problem Relation Age of Onset  . Heart disease Paternal Grandmother   . Bipolar disorder Father     Social History   Tobacco Use  . Smoking status: Current Every Day Smoker    Packs/day: 1.00    Years: 30.00    Pack years: 30.00    Types: Cigarettes  . Smokeless tobacco: Never Used  . Tobacco comment: Patient refused cessation materials  Vaping Use  . Vaping Use: Never used  Substance Use Topics  . Alcohol use: Yes    Alcohol/week: 24.0 standard drinks    Types: 24 Cans of beer per week    Comment: 6 pack per day  . Drug use: Yes    Types: Marijuana    Comment: on occasion    Home Medications Prior to Admission medications   Medication Sig Start Date End Date Taking? Authorizing Provider  bictegravir-emtricitabine-tenofovir AF (BIKTARVY) 50-200-25 MG TABS tablet Take 1 tablet by mouth daily. 08/03/19   Veryl Speak, FNP  furosemide (LASIX) 20 MG tablet Take 1 tablet (20 mg total) by mouth daily. 08/09/19   [name], MD  nystatin-triamcinolone ointment [facility]) Apply to affected area BID  06/16/19   Ladell Pier, MD  pantoprazole (PROTONIX) 40 MG tablet Take 1 tablet (40 mg total) by mouth daily. 05/26/19 05/25/20  Caren Griffins, MD  polyethylene glycol powder (GLYCOLAX/MIRALAX) 17 GM/SCOOP powder Take 17 g by mouth 2 (two) times daily as needed. 06/16/19   Ladell Pier, MD  predniSONE (DELTASONE) 20 MG tablet 1.5 tab daily x 2 days then 1 tab daily 07/22/19   Ladell Pier, MD  spironolactone (ALDACTONE) 50 MG tablet Take 1 tablet (50 mg total) by mouth daily. 08/09/19   Ladell Pier, MD  tamsulosin (FLOMAX) 0.4 MG CAPS capsule Take 1 capsule (0.4 mg total) by mouth daily. 04/07/19    Ladell Pier, MD  traZODone (DESYREL) 100 MG tablet Take 1.5 tablets (150 mg total) by mouth at bedtime as needed for sleep. 04/07/19   Ladell Pier, MD    Allergies    Bee venom, Lactose intolerance (gi), and Ultram [tramadol hcl]  Review of Systems   Review of Systems  Gastrointestinal: Positive for abdominal pain.  All other systems reviewed and are negative.   Physical Exam Updated Vital Signs BP 108/85   Pulse (!) 102   Temp 98.4 F (36.9 C)   Resp 13   Ht 6\' 1"  (1.854 m)   Wt 92.4 kg   SpO2 93%   BMI 26.88 kg/m   Physical Exam Vitals and nursing note reviewed.  Constitutional:      General: He is not in acute distress.    Appearance: He is well-developed.  HENT:     Head: Normocephalic and atraumatic.  Eyes:     Conjunctiva/sclera: Conjunctivae normal.     Pupils: Pupils are equal, round, and reactive to light.  Cardiovascular:     Rate and Rhythm: Normal rate and regular rhythm.     Heart sounds: Normal heart sounds.  Pulmonary:     Effort: Pulmonary effort is normal. No respiratory distress.     Breath sounds: Normal breath sounds.  Abdominal:     General: There is no distension.     Palpations: Abdomen is soft.     Tenderness: There is generalized abdominal tenderness.  Musculoskeletal:        General: No deformity. Normal range of motion.     Cervical back: Normal range of motion and neck supple.  Skin:    General: Skin is warm and dry.  Neurological:     Mental Status: He is alert and oriented to person, place, and time.     ED Results / Procedures / Treatments   Labs (all labs ordered are listed, but only abnormal results are displayed) Labs Reviewed  COMPREHENSIVE METABOLIC PANEL - Abnormal; Notable for the following components:      Result Value   Sodium 130 (*)    Chloride 96 (*)    Glucose, Bld 112 (*)    BUN <5 (*)    Calcium 8.5 (*)    Albumin 2.8 (*)    AST 87 (*)    Alkaline Phosphatase 249 (*)    Total Bilirubin  2.2 (*)    All other components within normal limits  CBC WITH DIFFERENTIAL/PLATELET - Abnormal; Notable for the following components:   WBC 15.0 (*)    RDW 17.8 (*)    Neutro Abs 10.2 (*)    Monocytes Absolute 1.1 (*)    Abs Immature Granulocytes 0.08 (*)    All other components within normal limits  URINALYSIS, ROUTINE W REFLEX MICROSCOPIC -  Abnormal; Notable for the following components:   Specific Gravity, Urine 1.002 (*)    All other components within normal limits  CULTURE, BLOOD (ROUTINE X 2)  CULTURE, BLOOD (ROUTINE X 2)  LACTIC ACID, PLASMA  PROTIME-INR  LACTIC ACID, PLASMA  ETHANOL  POC OCCULT BLOOD, ED    EKG EKG Interpretation  Date/Time:  Saturday August 27 2019 17:40:18 EDT Ventricular Rate:  110 PR Interval:  184 QRS Duration: 96 QT Interval:  342 QTC Calculation: 462 R Axis:   4 Text Interpretation: Sinus tachycardia Biatrial enlargement Incomplete right bundle branch block Cannot rule out Anterior infarct , age undetermined Abnormal ECG Confirmed by Kristine Royal [phone]) on 08/27/2019 8:02:12 PM   Radiology DG Chest 2 View  Result Date: 08/27/2019 CLINICAL DATA:  Chest and epigastric pain. EXAM: CHEST - 2 VIEW COMPARISON:  September 19, 2016 FINDINGS: There is no evidence of acute infiltrate, pleural effusion or pneumothorax. The heart size and mediastinal contours are within normal limits. The visualized skeletal structures are unremarkable. IMPRESSION: No active cardiopulmonary disease. Electronically Signed   By: Aram Candela M.D.   On: 08/27/2019 18:01    Procedures Procedures (including critical care time)  Medications Ordered in ED Medications  cefTRIAXone (ROCEPHIN) 2 g in sodium chloride 0.9 % 100 mL IVPB (has no administration in time range)  metroNIDAZOLE (FLAGYL) IVPB 500 mg (has no administration in time range)    ED Course  I have reviewed the triage vital signs and the nursing notes.  Pertinent labs & imaging results that were available  during my care of the patient were reviewed by me and considered in my medical decision making (see chart for details).    MDM Rules/Calculators/A&P                          MDM  Screen complete  Thomas Atkins was evaluated in Emergency Department on 08/27/2019 for the symptoms described in the history of present illness. He was evaluated in the context of the global COVID-19 pandemic, which necessitated consideration that the patient might be at risk for infection with the SARS-CoV-2 virus that causes COVID-19. Institutional protocols and algorithms that pertain to the evaluation of patients at risk for COVID-19 are in a state of rapid change based on information released by regulatory bodies including the CDC and federal and state organizations. These policies and algorithms were followed during the patient's care in the ED.   Patient is presenting for evaluation of diffuse abdominal discomfort.  Patient reports onset of abdominal discomfort after recent abdominal paracentesis.  Patient will be covered for possible SBP.  After discussion with hospitalist service, CT abdomen pelvis will be obtained.    Patient will be admitted for further work-up and treatment.   Final Clinical Impression(s) / ED Diagnoses Final diagnoses:  Abdominal pain, unspecified abdominal location    Rx / DC Orders ED Discharge Orders    None       Wynetta Fines, MD 08/27/19 2131

## 2019-08-27 NOTE — ED Notes (Signed)
Pt ambulated to room independently.

## 2019-08-27 NOTE — ED Triage Notes (Signed)
The pt is c/o pain all over his body abd pain with distention back chest discomfort  He had fluid removed from his abdomen this past week  He has not had a bm for 203 days and he has had difficulty voiding he last urinatedd this am  He has liver disease

## 2019-08-28 DIAGNOSIS — R1013 Epigastric pain: Secondary | ICD-10-CM

## 2019-08-28 LAB — COMPREHENSIVE METABOLIC PANEL
ALT: 36 U/L (ref 0–44)
AST: 65 U/L — ABNORMAL HIGH (ref 15–41)
Albumin: 2.5 g/dL — ABNORMAL LOW (ref 3.5–5.0)
Alkaline Phosphatase: 199 U/L — ABNORMAL HIGH (ref 38–126)
Anion gap: 11 (ref 5–15)
BUN: 5 mg/dL — ABNORMAL LOW (ref 6–20)
CO2: 23 mmol/L (ref 22–32)
Calcium: 8.1 mg/dL — ABNORMAL LOW (ref 8.9–10.3)
Chloride: 98 mmol/L (ref 98–111)
Creatinine, Ser: 0.77 mg/dL (ref 0.61–1.24)
GFR calc Af Amer: >60 mL/min (ref >60)
GFR calc non Af Amer: >60 mL/min (ref >60)
Glucose, Bld: 87 mg/dL (ref 70–99)
Potassium: 3.5 mmol/L (ref 3.5–5.1)
Sodium: 132 mmol/L — ABNORMAL LOW (ref 135–145)
Total Bilirubin: 2 mg/dL — ABNORMAL HIGH (ref 0.3–1.2)
Total Protein: 7 g/dL (ref 6.5–8.1)

## 2019-08-28 LAB — URINALYSIS, ROUTINE W REFLEX MICROSCOPIC
Bilirubin Urine: NEGATIVE
Glucose, UA: NEGATIVE mg/dL
Hgb urine dipstick: NEGATIVE
Ketones, ur: NEGATIVE mg/dL
Leukocytes,Ua: NEGATIVE
Nitrite: NEGATIVE
Protein, ur: NEGATIVE mg/dL
Specific Gravity, Urine: >1.046 — ABNORMAL HIGH (ref 1.005–1.030)
pH: 6 (ref 5.0–8.0)

## 2019-08-28 LAB — CBC
HCT: 36.5 % — ABNORMAL LOW (ref 39.0–52.0)
Hemoglobin: 12.2 g/dL — ABNORMAL LOW (ref 13.0–17.0)
MCH: 31.9 pg (ref 26.0–34.0)
MCHC: 33.4 g/dL (ref 30.0–36.0)
MCV: 95.5 fL (ref 80.0–100.0)
Platelets: 194 10*3/uL (ref 150–400)
RBC: 3.82 MIL/uL — ABNORMAL LOW (ref 4.22–5.81)
RDW: 18.1 % — ABNORMAL HIGH (ref 11.5–15.5)
WBC: 11.7 10*3/uL — ABNORMAL HIGH (ref 4.0–10.5)
nRBC: 0 % (ref 0.0–0.2)

## 2019-08-28 LAB — SARS CORONAVIRUS 2 BY RT PCR (HOSPITAL ORDER, PERFORMED IN ~~LOC~~ HOSPITAL LAB): SARS Coronavirus 2: NEGATIVE

## 2019-08-28 LAB — PROTIME-INR
INR: 1.2 (ref 0.8–1.2)
Prothrombin Time: 15.1 seconds (ref 11.4–15.2)

## 2019-08-28 LAB — APTT: aPTT: 45 seconds — ABNORMAL HIGH (ref 24–36)

## 2019-08-28 MED ORDER — ADULT MULTIVITAMIN W/MINERALS CH: 1.0000 | ORAL_TABLET | Freq: Every day | ORAL | 0 refills | Status: AC

## 2019-08-28 MED ORDER — HYDROCODONE-ACETAMINOPHEN 5-325 MG PO TABS: 1.0000 | ORAL_TABLET | Freq: Four times a day (QID) | ORAL | Status: DC | PRN

## 2019-08-28 MED ORDER — FOLIC ACID 5 MG/ML IJ SOLN
1.0000 mg | Freq: Every day | INTRAMUSCULAR | Status: DC
  Administered 2019-08-28: 1 mg via INTRAVENOUS
  Filled 2019-08-28: qty 0.2

## 2019-08-28 MED ORDER — THIAMINE HCL 100 MG PO TABS: 100.0000 mg | ORAL_TABLET | Freq: Every day | ORAL | 0 refills | Status: AC

## 2019-08-28 MED ORDER — CEFPODOXIME PROXETIL 200 MG PO TABS: 200.0000 mg | ORAL_TABLET | Freq: Two times a day (BID) | ORAL | 0 refills | Status: DC

## 2019-08-28 NOTE — ED Notes (Signed)
2 Gram sodium breakfast tray ordered 

## 2019-08-28 NOTE — Progress Notes (Signed)
Ambulated the patient around both Blue and Yellow areas of the Emergency department. Patient had steady gait. No pain associated with ambulation, or stretching.

## 2019-08-28 NOTE — Discharge Summary (Signed)
Physician Discharge Summary  Thomas Atkins:[id number] DOB: 1975/08/07 DOA: 08/27/2019  PCP: [name], MD  Admit date: 08/27/2019 Discharge date: 08/28/2019  Admitted From: Home Disposition: Home  Recommendations for Outpatient Follow-up:  1. Follow up with PCP in 1-2 weeks, GI within 3 to 5 days 2. Please obtain BMP/CBC in one week  Discharge Condition: Stable CODE STATUS: Full Diet recommendation: As tolerated  Brief/Interim Summary: Thomas Atkins is a 44 y.o. male with medical history significant of cirrhosis with associated complication of ascites, HIV, depression, alcoholism, tobacco abuse who presents to the ED due to abdominal pain.  Patient states that he underwent paracentesis approximately 3 days ago where he had 1.4 L removed.  He states that typically he has 4 to 6 L removed however he had a decrease in his normal amount of fluid noted on ultrasound.  He tolerated the procedure without difficulty however this afternoon he developed diffuse abdominal discomfort.  He reports the pain is most notable around the umbilicus however he also points to the epigastric region as well as both right and left lower quadrants.  He also admits to associated nausea with an episode of vomiting along with constipation as he reports no bowel movements over the last 3 to 4 days.  He does admit to passing flatus without difficulty although he feels this has been more difficult as of this evening as well.  He denies any associated chest pain or shortness of breath.  He denies any fever, chills, cough, dysuria. Of note, the patient admits to significant alcohol abuse and is concerned about alcohol withdrawal while in the hospital.  He is also requested nicotine patch due to his nicotine addiction as well. In the ED he underwent a chest x-ray which showed no acute cardiopulmonary disease. Labs are relevant for mild hyponatremia at 130 leukocytosis of 15.  Patient was evaluated overnight for abdominal  pain concerning for early SBP despite low neutrophil count on previous tap, unable to tap patient again overnight due to low volume ascites.  Patient's pain is now resolved, tolerating p.o. well ambulating without difficulty and is generally benign on abdominal exam.  Given patient's risk for infection given multiple recurrent paracentesis with known HIV infection will continue Cefpodoxime for 5-day course to rule out any early infection as patient's abdominal pain has no other clear etiology at this time.  Patient remains without fever, leukocytosis overnight likely somewhat reactive given its marked resolution over the past 12 hours.  Patient otherwise educated to follow very closely with PCP, GI in the next few days and to return to the ED immediately if he has worsening abdominal pain or symptoms including nausea, vomiting, fever, chills, headache, diarrhea or constipation.Marland Kitchen  Discharge Diagnoses:  Active Problems:   Alcohol abuse with alcohol-induced mood disorder (HCC)   Alcohol use disorder, moderate, dependence (HCC)   Tobacco dependence   Constipation   Asymptomatic HIV infection (HCC)   Abdominal pain   Alcoholic cirrhosis of liver with ascites [facility])    Discharge Instructions  Discharge Instructions    Call MD for:  difficulty breathing, headache or visual disturbances   Complete by: As directed    Call MD for:  extreme fatigue   Complete by: As directed    Call MD for:  hives   Complete by: As directed    Call MD for:  persistant dizziness or light-headedness   Complete by: As directed    Call MD for:  persistant nausea and vomiting  Complete by: As directed    Call MD for:  severe uncontrolled pain   Complete by: As directed    Call MD for:  temperature >100.4   Complete by: As directed    Diet - low sodium heart healthy   Complete by: As directed    Increase activity slowly   Complete by: As directed      Allergies as of 08/28/2019      Reactions   Bee Venom  Anaphylaxis   Lactose Intolerance (gi) Diarrhea, Nausea Only, Other (See Comments)   Abdominal pain     Ultram [tramadol Hcl] Hives         Medication List    STOP taking these medications   nystatin-triamcinolone ointment Commonly known as: MYCOLOG   polyethylene glycol powder 17 GM/SCOOP powder Commonly known as: GLYCOLAX/MIRALAX     TAKE these medications   AZO TABS PO Take 1 tablet by mouth daily as needed (urinary tract pain).   BC HEADACHE POWDER PO Take 1 packet by mouth 2 (two) times daily as needed (pain/headache).   bictegravir-emtricitabine-tenofovir AF 50-200-25 MG Tabs tablet Commonly known as: BIKTARVY Take 1 tablet by mouth daily. What changed: when to take this   cefpodoxime 200 MG tablet Commonly known as: VANTIN Take 1 tablet (200 mg total) by mouth 2 (two) times daily.   EX-LAX PO Take 1 tablet by mouth daily as needed (constipation).   furosemide 20 MG tablet Commonly known as: LASIX Take 1 tablet (20 mg total) by mouth daily. What changed: when to take this   GAS-X PO Take 1 tablet by mouth daily as needed (flatulence).   multivitamin with minerals Tabs tablet Take 1 tablet by mouth daily. Start taking on: August 29, 2019   pantoprazole 40 MG tablet Commonly known as: Protonix Take 1 tablet (40 mg total) by mouth daily. What changed: when to take this   PEPTO-BISMOL PO Take 1 tablet by mouth daily as needed (heartburn/indigestion).   spironolactone 50 MG tablet Commonly known as: Aldactone Take 1 tablet (50 mg total) by mouth daily. What changed: when to take this   tamsulosin 0.4 MG Caps capsule Commonly known as: FLOMAX Take 1 capsule (0.4 mg total) by mouth daily. What changed: when to take this   thiamine 100 MG tablet Take 1 tablet (100 mg total) by mouth daily. Start taking on: August 29, 2019   traZODone 100 MG tablet Commonly known as: DESYREL Take 1.5 tablets (150 mg total) by mouth at bedtime as needed for sleep.    VITAMIN C PO Take 1 tablet by mouth daily.       Allergies  Allergen Reactions  . Bee Venom Anaphylaxis  . Lactose Intolerance (Gi) Diarrhea, Nausea Only and Other (See Comments)    Abdominal pain    . Ultram [Tramadol Hcl] Hives         Consultations:  None   Procedures/Studies: DG Chest 2 View  Result Date: 08/27/2019 CLINICAL DATA:  Chest and epigastric pain. EXAM: CHEST - 2 VIEW COMPARISON:  September 19, 2016 FINDINGS: There is no evidence of acute infiltrate, pleural effusion or pneumothorax. The heart size and mediastinal contours are within normal limits. The visualized skeletal structures are unremarkable. IMPRESSION: No active cardiopulmonary disease. Electronically Signed   By: Aram Candelahaddeus  Houston M.D.   On: 08/27/2019 18:01   CT ABDOMEN PELVIS W CONTRAST  Result Date: 08/27/2019 CLINICAL DATA:  44 year old male with abdominal pain. EXAM: CT ABDOMEN AND PELVIS WITH CONTRAST TECHNIQUE: Multidetector  CT imaging of the abdomen and pelvis was performed using the standard protocol following bolus administration of intravenous contrast. CONTRAST:  12mL OMNIPAQUE IOHEXOL 300 MG/ML  SOLN COMPARISON:  CT abdomen pelvis dated 05/25/2019. FINDINGS: Lower chest: The visualized lung bases are clear. No intra-abdominal free air. There is a small ascites, increased since the prior CT. Hepatobiliary: There is diffuse fatty infiltration of the liver. The liver is enlarged measuring approximately 20 cm in midclavicular length. Clinical correlation is recommended to evaluate for steatohepatitis. There is slight irregularity of the liver contour which may represent early changes of cirrhosis. No calcified gallstone. Pancreas: Unremarkable. No pancreatic ductal dilatation or surrounding inflammatory changes. Spleen: Normal in size without focal abnormality. Adrenals/Urinary Tract: The adrenal glands are unremarkable. The kidneys, visualized ureters, and urinary bladder appear unremarkable.  Stomach/Bowel: There is no bowel obstruction. The appendix is normal. Vascular/Lymphatic: The abdominal aorta and IVC are unremarkable. The SMV, splenic vein, and main portal vein are patent. No portal venous gas. No adenopathy. Reproductive: The prostate and seminal vesicles are grossly unremarkable. Other: Mild subcutaneous edema. Musculoskeletal: Pectus excavatum. No acute osseous pathology. IMPRESSION: 1. Hepatomegaly with diffuse fatty infiltration of the liver. Clinical correlation is recommended to evaluate for steatohepatitis. 2. Small ascites, increased since the prior CT. 3. No bowel obstruction. Normal appendix. Electronically Signed   By: Anner Crete M.D.   On: 08/27/2019 22:08   IR Paracentesis  Result Date: 08/24/2019 INDICATION: History of alcoholic cirrhosis, now with recurrent symptomatic ascites. Please perform ultrasound-guided paracentesis for diagnostic and therapeutic purposes. EXAM: ULTRASOUND-GUIDED PARACENTESIS COMPARISON:  Multiple previous ultrasound-guided paracentesis, most recently on 08/10/2019 yielding 2.8 L of peritoneal fluid; CT abdomen and pelvis-05/25/2019 MEDICATIONS: None. COMPLICATIONS: None immediate. TECHNIQUE: Informed written consent was obtained from the patient after a discussion of the risks, benefits and alternatives to treatment. A timeout was performed prior to the initiation of the procedure. Initial ultrasound scanning demonstrates a small amount of ascites within the right lower abdomen which was subsequently prepped and draped in the usual sterile fashion. 1% lidocaine with epinephrine was used for local anesthesia. Under direct ultrasound guidance, a 19 gauge, 7-cm, Yueh catheter was introduced. An ultrasound image was saved for documentation purposed. The paracentesis was performed. The catheter was removed and a dressing was applied. The patient tolerated the procedure well without immediate post procedural complication. FINDINGS: A total of  approximately 1.4 liters of serous fluid was removed. Samples were sent to the laboratory as requested by the clinical team. IMPRESSION: Successful ultrasound-guided paracentesis yielding 1.4 liters of peritoneal fluid. Electronically Signed   By: Sandi Mariscal M.D.   On: 08/24/2019 16:44   IR Paracentesis  Result Date: 08/10/2019 INDICATION: History of alcoholic cirrhosis with recurrent ascites. Request is made for diagnostic and therapeutic paracentesis. EXAM: ULTRASOUND GUIDED RIGHT LOWER QUADRANT PARACENTESIS MEDICATIONS: None. COMPLICATIONS: None immediate. PROCEDURE: Informed written consent was obtained from the patient after a discussion of the risks, benefits and alternatives to treatment. A timeout was performed prior to the initiation of the procedure. Initial ultrasound scanning demonstrates a small to moderate amount of ascites within the right lower abdominal quadrant. The right lower abdomen was prepped and draped in the usual sterile fashion. 1% lidocaine was used for local anesthesia. Following this, a 19 gauge, 7-cm, Yueh catheter was introduced. An ultrasound image was saved for documentation purposes. The paracentesis was performed. The catheter was removed and a dressing was applied. The patient tolerated the procedure well without immediate post procedural complication. FINDINGS: A total  of approximately 2.8 L of clear yellow fluid was removed. Samples were sent to the laboratory as requested by the clinical team. IMPRESSION: Successful ultrasound-guided paracentesis yielding 2.8 liters of peritoneal fluid. Read by: Brayton El PA-C Electronically Signed   By: Corlis Leak M.D.   On: 08/10/2019 14:20   IR Paracentesis  Result Date: 08/03/2019 INDICATION: Patient with history of cirrhosis and large volume ascites. Interventional radiology asked to perform a diagnostic and therapeutic paracentesis. EXAM: ULTRASOUND GUIDED right PARACENTESIS MEDICATIONS: 1% lidocaine 10 mL COMPLICATIONS: None  immediate. PROCEDURE: Informed written consent was obtained from the patient after a discussion of the risks, benefits and alternatives to treatment. A timeout was performed prior to the initiation of the procedure. Initial ultrasound scanning demonstrates a large amount of ascites within the right lower abdominal quadrant. The right lower abdomen was prepped and draped in the usual sterile fashion. 1% lidocaine was used for local anesthesia. Following this, a 19 gauge, 7-cm, Yueh catheter was introduced. An ultrasound image was saved for documentation purposes. The paracentesis was performed. The catheter was removed and a dressing was applied. The patient tolerated the procedure well without immediate post procedural complication. FINDINGS: A total of approximately 4.6 L of clear yellow fluid was removed. Samples were sent to the laboratory as requested by the clinical team. IMPRESSION: Successful ultrasound-guided paracentesis yielding 4.6 liters of peritoneal fluid. Read by: Alwyn Ren, NP Electronically Signed   By: Irish Lack M.D.   On: 08/03/2019 15:18      Subjective: No acute issues or events overnight, patient's abdomen is now "normal" he denies any distention, pain, headache, fever, chills, nausea, vomiting, diarrhea, constipation.   Discharge Exam: Vitals:   08/28/19 0641 08/28/19 1003  BP:  117/83  Pulse:  (!) 103  Resp:    Temp: 98.7 F (37.1 C)   SpO2:     Vitals:   08/28/19 0500 08/28/19 0600 08/28/19 0641 08/28/19 1003  BP: (!) 93/58 99/69  117/83  Pulse: (!) 111 (!) 106  (!) 103  Resp: (!) 24 (!) 26    Temp:   98.7 F (37.1 C)   TempSrc:   Oral   SpO2: 94% 93%    Weight:      Height:        General: Pt is alert, awake, not in acute distress Cardiovascular: RRR, S1/S2 +, no rubs, no gallops Respiratory: CTA bilaterally, no wheezing, no rhonchi Abdominal: Soft, NT, mildly distended, bowel sounds + Extremities: no edema, no cyanosis    The results of  significant diagnostics from this hospitalization (including imaging, microbiology, ancillary and laboratory) are listed below for reference.     Microbiology: Recent Results (from the past 240 hour(s))  Culture, blood (Routine x 2)     Status: None (Preliminary result)   Collection Time: 08/27/19  5:52 PM   Specimen: BLOOD  Result Value Ref Range Status   Specimen Description BLOOD RIGHT ANTECUBITAL  Final   Special Requests   Final    BOTTLES DRAWN AEROBIC AND ANAEROBIC Blood Culture adequate volume   Culture   Final    NO GROWTH < 24 HOURS Performed at [facility], [address] [place], [place] [address]    Report Status PENDING  Incomplete  Culture, blood (Routine x 2)     Status: None (Preliminary result)   Collection Time: 08/27/19  9:38 PM   Specimen: BLOOD RIGHT FOREARM  Result Value Ref Range Status   Specimen Description BLOOD RIGHT FOREARM  Final   Special Requests   Final    BOTTLES DRAWN AEROBIC AND ANAEROBIC Blood Culture results may not be optimal due to an inadequate volume of blood received in culture bottles   Culture   Final    NO GROWTH < 12 HOURS Performed at [facility], [address], [place], [place] [address]    Report Status PENDING  Incomplete  SARS Coronavirus 2 by RT PCR (hospital order, performed in [facility] hospital lab) Nasopharyngeal Nasopharyngeal Swab     Status: None   Collection Time: 08/27/19 10:43 PM   Specimen: Nasopharyngeal Swab  Result Value Ref Range Status   SARS Coronavirus 2 NEGATIVE NEGATIVE Final    Comment: (NOTE) SARS-CoV-2 target nucleic acids are NOT DETECTED.  The SARS-CoV-2 RNA is generally detectable in upper and lower respiratory specimens during the acute phase of infection. The lowest concentration of SARS-CoV-2 viral copies this assay can detect is 250 copies / mL. A negative result does not preclude SARS-CoV-2 infection and should not be used as the sole basis for treatment or other patient  management decisions.  A negative result may occur with improper specimen collection / handling, submission of specimen other than nasopharyngeal swab, presence of viral mutation(s) within the areas targeted by this assay, and inadequate number of viral copies (<250 copies / mL). A negative result must be combined with clinical observations, patient history, and epidemiological information.  Fact Sheet for Patients:   BoilerBrush.com.cy  Fact Sheet for Healthcare Providers: https://pope.com/  This test is not yet approved or  cleared by the [place] FDA and has been authorized for detection and/or diagnosis of SARS-CoV-2 by FDA under an Emergency Use Authorization (EUA).  This EUA will remain in effect (meaning this test can be used) for the duration of the COVID-19 declaration under Section 564(b)(1) of the Act, 21 U.S.C. section 360bbb-3(b)(1), unless the authorization is terminated or revoked sooner.  Performed at [facility], [address] [place], [place] [address]      Labs: BNP (last 3 results) No results for input(s): BNP in the last 8760 hours. Basic Metabolic Panel: Recent Labs  Lab 08/27/19 1752 08/27/19 2243 08/28/19 0410  NA 130*  --  132*  K 3.5  --  3.5  CL 96*  --  98  CO2 22  --  23  GLUCOSE 112*  --  87  BUN <5*  --  5*  CREATININE 0.77 0.72 0.77  CALCIUM 8.5*  --  8.1*  MG  --  2.0  --   PHOS  --  3.7  --    Liver Function Tests: Recent Labs  Lab 08/27/19 1752 08/28/19 0410  AST 87* 65*  ALT 43 36  ALKPHOS 249* 199*  BILITOT 2.2* 2.0*  PROT 7.8 7.0  ALBUMIN 2.8* 2.5*   No results for input(s): LIPASE, AMYLASE in the last 168 hours. No results for input(s): AMMONIA in the last 168 hours. CBC: Recent Labs  Lab 08/27/19 1752 08/27/19 2243 08/28/19 0410  WBC 15.0* 12.4* 11.7*  NEUTROABS 10.2*  --   --   HGB 13.5 12.2* 12.2*  HCT 39.8 36.0* 36.5*  MCV 94.1 94.5 95.5  PLT  206 204 194   Cardiac Enzymes: No results for input(s): CKTOTAL, CKMB, CKMBINDEX, TROPONINI in the last 168 hours. BNP: Invalid input(s): POCBNP CBG: No results for input(s): GLUCAP in the last 168 hours. D-Dimer No results for input(s): DDIMER in the last 72 hours. Hgb  A1c No results for input(s): HGBA1C in the last 72 hours. Lipid Profile No results for input(s): CHOL, HDL, LDLCALC, TRIG, CHOLHDL, LDLDIRECT in the last 72 hours. Thyroid function studies No results for input(s): TSH, T4TOTAL, T3FREE, THYROIDAB in the last 72 hours.  Invalid input(s): FREET3 Anemia work up No results for input(s): VITAMINB12, FOLATE, FERRITIN, TIBC, IRON, RETICCTPCT in the last 72 hours. Urinalysis    Component Value Date/Time   COLORURINE AMBER (A) 08/28/2019 0420   APPEARANCEUR CLEAR 08/28/2019 0420   LABSPEC >1.046 (H) 08/28/2019 0420   PHURINE 6.0 08/28/2019 0420   GLUCOSEU NEGATIVE 08/28/2019 0420   HGBUR NEGATIVE 08/28/2019 0420   BILIRUBINUR NEGATIVE 08/28/2019 0420   KETONESUR NEGATIVE 08/28/2019 0420   PROTEINUR NEGATIVE 08/28/2019 0420   UROBILINOGEN 0.2 02/19/2013 1819   NITRITE NEGATIVE 08/28/2019 0420   LEUKOCYTESUR NEGATIVE 08/28/2019 0420   Sepsis Labs Invalid input(s): PROCALCITONIN,  WBC,  LACTICIDVEN Microbiology Recent Results (from the past 240 hour(s))  Culture, blood (Routine x 2)     Status: None (Preliminary result)   Collection Time: 08/27/19  5:52 PM   Specimen: BLOOD  Result Value Ref Range Status   Specimen Description BLOOD RIGHT ANTECUBITAL  Final   Special Requests   Final    BOTTLES DRAWN AEROBIC AND ANAEROBIC Blood Culture adequate volume   Culture   Final    NO GROWTH < 24 HOURS Performed at [facility], [address] [place], [place] [address]    Report Status PENDING  Incomplete  Culture, blood (Routine x 2)     Status: None (Preliminary result)   Collection Time: 08/27/19  9:38 PM   Specimen: BLOOD RIGHT FOREARM  Result Value Ref  Range Status   Specimen Description BLOOD RIGHT FOREARM  Final   Special Requests   Final    BOTTLES DRAWN AEROBIC AND ANAEROBIC Blood Culture results may not be optimal due to an inadequate volume of blood received in culture bottles   Culture   Final    NO GROWTH < 12 HOURS Performed at [facility], [address], [place], [place] [address]    Report Status PENDING  Incomplete  SARS Coronavirus 2 by RT PCR (hospital order, performed in [facility] hospital lab) Nasopharyngeal Nasopharyngeal Swab     Status: None   Collection Time: 08/27/19 10:43 PM   Specimen: Nasopharyngeal Swab  Result Value Ref Range Status   SARS Coronavirus 2 NEGATIVE NEGATIVE Final    Comment: (NOTE) SARS-CoV-2 target nucleic acids are NOT DETECTED.  The SARS-CoV-2 RNA is generally detectable in upper and lower respiratory specimens during the acute phase of infection. The lowest concentration of SARS-CoV-2 viral copies this assay can detect is 250 copies / mL. A negative result does not preclude SARS-CoV-2 infection and should not be used as the sole basis for treatment or other patient management decisions.  A negative result may occur with improper specimen collection / handling, submission of specimen other than nasopharyngeal swab, presence of viral mutation(s) within the areas targeted by this assay, and inadequate number of viral copies (<250 copies / mL). A negative result must be combined with clinical observations, patient history, and epidemiological information.  Fact Sheet for Patients:   BoilerBrush.com.cy  Fact Sheet for Healthcare Providers: https://pope.com/  This test is not yet approved or  cleared by the [place] FDA and has been authorized for detection and/or diagnosis of SARS-CoV-2 by FDA under an Emergency Use Authorization (EUA).  This EUA will  remain in effect (meaning this test can be used) for the duration of  the COVID-19 declaration under Section 564(b)(1) of the Act, 21 U.S.C. section 360bbb-3(b)(1), unless the authorization is terminated or revoked sooner.  Performed at [facility], [address], [place], [place] [address]      Time coordinating discharge: Over 30 minutes  SIGNED:   Azucena Fallen, DO [facility] 08/28/2019, 11:12 AM Pager   If 7PM-7AM, please contact night-coverage [email or web address]

## 2019-08-29 ENCOUNTER — Ambulatory Visit: Payer: Self-pay | Attending: Internal Medicine | Admitting: Licensed Clinical Social Worker

## 2019-08-29 ENCOUNTER — Other Ambulatory Visit: Payer: Self-pay

## 2019-08-29 DIAGNOSIS — F331 Major depressive disorder, recurrent, moderate: Secondary | ICD-10-CM

## 2019-08-29 DIAGNOSIS — F411 Generalized anxiety disorder: Secondary | ICD-10-CM

## 2019-08-29 MED FILL — TAMSULOSIN HCL 0.4 MG CAP: 0.4 | 30 days supply | Qty: 30 | Fill #2

## 2019-08-29 MED FILL — CEFPODOXIME 200 MG TABLET: 200 | 5 days supply | Qty: 10 | Fill #0

## 2019-09-01 ENCOUNTER — Other Ambulatory Visit: Payer: Self-pay

## 2019-09-01 ENCOUNTER — Encounter: Payer: Self-pay | Admitting: Family

## 2019-09-01 ENCOUNTER — Ambulatory Visit (INDEPENDENT_AMBULATORY_CARE_PROVIDER_SITE_OTHER): Payer: Self-pay | Admitting: Family

## 2019-09-01 VITALS — BP 107/73 | HR 96 | Temp 97.9°F | Wt 190.0 lb

## 2019-09-01 DIAGNOSIS — Z21 Asymptomatic human immunodeficiency virus [HIV] infection status: Secondary | ICD-10-CM

## 2019-09-01 DIAGNOSIS — R188 Other ascites: Secondary | ICD-10-CM

## 2019-09-01 DIAGNOSIS — Z0000 Encounter for general adult medical examination without abnormal findings: Secondary | ICD-10-CM

## 2019-09-01 LAB — CULTURE, BLOOD (ROUTINE X 2)
Culture: NO GROWTH
Culture: NO GROWTH
Special Requests: ADEQUATE

## 2019-09-01 MED ORDER — BICTEGRAVIR-EMTRICITAB-TENOFOV 50-200-25 MG PO TABS: 1.0000 | ORAL_TABLET | Freq: Every day | ORAL | 2 refills | Status: DC

## 2019-09-01 NOTE — Assessment & Plan Note (Signed)
Thomas Atkins continues to have well controlled HIV disease with good adherence and tolerance to his ART regimen of Biktarvy.  No signs/symptoms of opportunistic infection or progressive HIV disease.  We reviewed previous lab work and discussed the plan of care.  Check blood work today.  Continue current dose of Biktarvy.  Plan for follow-up in 2 months or sooner if needed with lab work on the same day.

## 2019-09-01 NOTE — Assessment & Plan Note (Signed)
   Discussed importance of safe sexual practice to reduce risk of STI.  Condoms declined. 

## 2019-09-01 NOTE — Patient Instructions (Signed)
Nice to see you.  We will check your blood work today.  Continue take your [place] daily as prescribed.  Refills will be sent to the pharmacy.  Plan for follow-up in 2 months or sooner if needed.  Have a great day and stay safe!

## 2019-09-01 NOTE — Progress Notes (Signed)
Subjective:    Patient ID: Thomas Atkins, male    DOB: 02/23/1976, 44 y.o.   MRN: [id number]  Chief Complaint  Patient presents with  . Follow-up    B20     HPI:  Thomas Atkins is a 44 y.o. male with HIV disease who was last seen in the office on 08/03/19 with good adherence and tolerance to his ART regimen of Biktarvy. Viral load at the time was 280 with CD4 count of 1,142. Was recently seen in the ED for abdominal pain. Here today for routine follow up.  Mr. Creason continues to take his Biktarvy daily as prescribed with no adverse side effects or missed doses since his last office visit. Overall feeling okay today with no new concerns related to HIV. Recently had paracentesis. Denies fevers, chills, night sweats, headaches, changes in vision, neck pain/stiffness, nausea, diarrhea, vomiting, lesions or rashes.  Mr. Mayorquin has no problems obtaining medication from the pharmacy. Denies feelings of being down, depressed, or hopeless recently.  No recreational or illicit drug use,.  Continues to smoke approximately 1 pack of cigarettes per day on average and consume alcohol.  Not currently sexually active.   Allergies  Allergen Reactions  . Bee Venom Anaphylaxis  . Lactose Intolerance (Gi) Diarrhea, Nausea Only and Other (See Comments)    Abdominal pain    . Ultram [Tramadol Hcl] Hives           Outpatient Medications Prior to Visit  Medication Sig Dispense Refill  . Ascorbic Acid (VITAMIN C PO) Take 1 tablet by mouth daily.    . Aspirin-Salicylamide-Caffeine (BC HEADACHE POWDER PO) Take 1 packet by mouth 2 (two) times daily as needed (pain/headache).    . Bismuth Subsalicylate (PEPTO-BISMOL PO) Take 1 tablet by mouth daily as needed (heartburn/indigestion).    . cefpodoxime (VANTIN) 200 MG tablet Take 1 tablet (200 mg total) by mouth 2 (two) times daily. 10 tablet 0  . furosemide (LASIX) 20 MG tablet Take 1 tablet (20 mg total) by mouth daily. (Patient taking differently: Take 20 mg  by mouth daily at 12 noon. ) 30 tablet 4  . Multiple Vitamin (MULTIVITAMIN WITH MINERALS) TABS tablet Take 1 tablet by mouth daily. 30 tablet 0  . pantoprazole (PROTONIX) 40 MG tablet Take 1 tablet (40 mg total) by mouth daily. (Patient taking differently: Take 40 mg by mouth daily at 12 noon. ) 30 tablet 1  . Phenazopyridine HCl (AZO TABS PO) Take 1 tablet by mouth daily as needed (urinary tract pain).    . Sennosides (EX-LAX PO) Take 1 tablet by mouth daily as needed (constipation).    . Simethicone (GAS-X PO) Take 1 tablet by mouth daily as needed (flatulence).    Marland Kitchen spironolactone (ALDACTONE) 50 MG tablet Take 1 tablet (50 mg total) by mouth daily. (Patient taking differently: Take 50 mg by mouth daily at 12 noon. ) 30 tablet 4  . tamsulosin (FLOMAX) 0.4 MG CAPS capsule Take 1 capsule (0.4 mg total) by mouth daily. (Patient taking differently: Take 0.4 mg by mouth daily at 12 noon. ) 30 capsule 3  . thiamine 100 MG tablet Take 1 tablet (100 mg total) by mouth daily. 30 tablet 0  . traZODone (DESYREL) 100 MG tablet Take 1.5 tablets (150 mg total) by mouth at bedtime as needed for sleep. 45 tablet 4  . bictegravir-emtricitabine-tenofovir AF (BIKTARVY) 50-200-25 MG TABS tablet Take 1 tablet by mouth daily. (Patient taking differently: Take 1 tablet by mouth daily  before breakfast. ) 30 tablet 2   No facility-administered medications prior to visit.     Past Medical History:  Diagnosis Date  . Alcoholism ([place])   . Anxiety   . Depression   . HIV infection ([place])   . Seizure disorder ([place])   . Syncope      Past Surgical History:  Procedure Laterality Date  . IR PARACENTESIS  07/01/2019  . IR PARACENTESIS  07/15/2019  . IR PARACENTESIS  07/27/2019  . IR PARACENTESIS  08/03/2019  . IR PARACENTESIS  08/10/2019  . IR PARACENTESIS  08/24/2019  . SURGERY SCROTAL / TESTICULAR         Review of Systems  Constitutional: Negative for appetite change, chills, fatigue, fever and unexpected weight  change.  Eyes: Negative for visual disturbance.  Respiratory: Negative for cough, chest tightness, shortness of breath and wheezing.   Cardiovascular: Negative for chest pain and leg swelling.  Gastrointestinal: Negative for abdominal pain, constipation, diarrhea, nausea and vomiting.  Genitourinary: Negative for dysuria, flank pain, frequency, genital sores, hematuria and urgency.  Skin: Negative for rash.  Allergic/Immunologic: Negative for immunocompromised state.  Neurological: Negative for dizziness and headaches.      Objective:    BP 107/73   Pulse 96   Temp 97.9 F (36.6 C) (Oral)   Wt 190 lb (86.2 kg)   BMI 25.07 kg/m  Nursing note and vital signs reviewed.  Physical Exam Constitutional:      General: He is not in acute distress.    Appearance: He is well-developed.  Eyes:     Conjunctiva/sclera: Conjunctivae normal.  Cardiovascular:     Rate and Rhythm: Normal rate and regular rhythm.     Heart sounds: Normal heart sounds. No murmur heard.  No friction rub. No gallop.   Pulmonary:     Effort: Pulmonary effort is normal. No respiratory distress.     Breath sounds: Normal breath sounds. No wheezing or rales.  Chest:     Chest wall: No tenderness.  Abdominal:     General: Bowel sounds are normal.     Palpations: Abdomen is soft.     Tenderness: There is no abdominal tenderness.     Comments: Ascites present.   Musculoskeletal:     Cervical back: Neck supple.  Lymphadenopathy:     Cervical: No cervical adenopathy.  Skin:    General: Skin is warm and dry.     Findings: No rash.  Neurological:     Mental Status: He is alert and oriented to person, place, and time.  Psychiatric:        Behavior: Behavior normal.        Thought Content: Thought content normal.        Judgment: Judgment normal.      Depression screen [facility] 2/9 08/09/2019 08/03/2019 07/22/2019 07/04/2019 09/01/2018  Decreased Interest 3 0 3 0 1  Down, Depressed, Hopeless 3 1 3 1 1   PHQ - 2 Score  6 1 6 1 2   Altered sleeping 2 - 3 - 3  Tired, decreased energy 3 - 3 - 1  Change in appetite 2 - 0 - 0  Feeling bad or failure about yourself  0 - 0 - 1  Trouble concentrating 1 - 1 - 0  Moving slowly or fidgety/restless 0 - 3 - 0  Suicidal thoughts 0 - 3 - 0  PHQ-9 Score 14 - 19 - 7       Assessment & Plan:    Patient  Active Problem List   Diagnosis Date Noted  . Abdominal pain 08/27/2019  . Alcoholic cirrhosis of liver with ascites (HCC) 08/27/2019  . Healthcare maintenance 08/03/2019  . Rash and nonspecific skin eruption 07/22/2019  . Ascites of liver 07/04/2019  . Constipation 06/16/2019  . Asymptomatic HIV infection (HCC) 06/16/2019  . GI bleed 05/26/2019  . History of hypertension 07/31/2017  . Alcohol use disorder, moderate, dependence (HCC) 07/31/2017  . Tobacco dependence 07/31/2017  . Alcohol-induced insomnia (HCC) 07/31/2017  . Major depressive disorder, recurrent severe without psychotic features (HCC) 10/10/2015  . Alcohol abuse with alcohol-induced mood disorder (HCC) 05/31/2015     Problem List Items Addressed This Visit      Digestive   Ascites of liver    Continues to have ascites managed by gastroenterology in 3 paracentesis.        Other   Asymptomatic HIV infection (HCC)    Ms. Reth continues to have well controlled HIV disease with good adherence and tolerance to his ART regimen of Biktarvy.  No signs/symptoms of opportunistic infection or progressive HIV disease.  We reviewed previous lab work and discussed the plan of care.  Check blood work today.  Continue current dose of Biktarvy.  Plan for follow-up in 2 months or sooner if needed with lab work on the same day.      Relevant Medications   bictegravir-emtricitabine-tenofovir AF (BIKTARVY) 50-200-25 MG TABS tablet   Other Relevant Orders   HIV-1 RNA quant-no reflex-bld   T-helper cell (CD4)- ([facility] clinic only)   Healthcare maintenance     Discussed importance of safe sexual practice to  reduce risk of STI.  Condoms declined.          I am having Loden P. Justiniano maintain his tamsulosin, traZODone, pantoprazole, spironolactone, furosemide, Sennosides (EX-LAX PO), Simethicone (GAS-X PO), Ascorbic Acid (VITAMIN C PO), Phenazopyridine HCl (AZO TABS PO), Bismuth Subsalicylate (PEPTO-BISMOL PO), Aspirin-Salicylamide-Caffeine (BC HEADACHE POWDER PO), multivitamin with minerals, thiamine, cefpodoxime, and bictegravir-emtricitabine-tenofovir AF.   Meds ordered this encounter  Medications  . bictegravir-emtricitabine-tenofovir AF (BIKTARVY) 50-200-25 MG TABS tablet    Sig: Take 1 tablet by mouth daily.    Dispense:  30 tablet    Refill:  2    Order Specific Question:   Supervising Provider    Answer:   Judyann Munson [[id number]]     Follow-up: Return in about 2 months (around 11/01/2019), or if symptoms worsen or fail to improve.   Marcos Eke, MSN, FNP-C Nurse Practitioner [facility] Main number: [phone]

## 2019-09-01 NOTE — Assessment & Plan Note (Signed)
Continues to have ascites managed by gastroenterology in 3 paracentesis.

## 2019-09-02 LAB — T-HELPER CELL (CD4) - (RCID CLINIC ONLY)
CD4 % Helper T Cell: 57 % (ref 33–65)
CD4 T Cell Abs: 1243 /uL (ref 400–1790)

## 2019-09-04 LAB — HIV-1 RNA QUANT-NO REFLEX-BLD
HIV 1 RNA Quant: 166 copies/mL — ABNORMAL HIGH
HIV-1 RNA Quant, Log: 2.22 Log copies/mL — ABNORMAL HIGH

## 2019-09-05 NOTE — BH Specialist Note (Signed)
Integrated Behavioral Health Initial Visit  MRN: [id number] Name: Thomas Atkins  Number of Integrated Behavioral Health Clinician visits:: 1/6 Session Start time: 2:35 PM  Session End time: 2:55 PM Total time: 20  Type of Service: Integrated Behavioral Health- Individual Interpretor:No. Interpretor Name and Language: NA   SUBJECTIVE: Thomas Atkins is a 44 y.o. male accompanied by Self Patient was referred by Dr. Laural Benes for depression and anxiety. Patient reports the following symptoms/concerns: Patient reports difficulty managing depression anxiety symptoms triggered by ongoing medical conditions and financial strain Duration of problem: Ongoing; Severity of problem: moderate  OBJECTIVE: Mood: Appropriate and Affect: Appropriate Risk of harm to self or others: No plan to harm self or others  LIFE CONTEXT: Family and Social: Patient receives support from family and friends.  He resides with friends currently School/Work: Patient has applied for Medicaid, claim is pending Self-Care: Patient speaks with family and friends, watches TV, and enjoys sitting outside to cope with stressors Life Changes: Patient is managing chronic medical conditions and is experiencing psychosocial stressors  GOALS ADDRESSED: Patient will: 1. Reduce symptoms of: anxiety, depression and stress 2. Increase knowledge and/or ability of: coping skills and healthy habits  3. Demonstrate ability to: Increase healthy adjustment to current life circumstances and Increase adequate support systems for patient/family  INTERVENTIONS: Interventions utilized: Solution-Focused Strategies, Supportive Counseling and Link to [facility]  Standardized Assessments completed: GAD-7 and PHQ 2&9  ASSESSMENT: Patient currently experiencing difficulty managing depression anxiety symptoms triggered by psychosocial stressors.  Patient receives support from family and friends.   Patient may benefit from medication  management and therapy.  Patient is receiving services through [facility] and is not interested in medication management at this time.  Healthy coping skills discussed to assist with management and/or decrease of symptoms  PLAN: 1. Follow up with behavioral health clinician on : Contact LCSW with any additional behavioral health and/or resource needs 2. Behavioral recommendations: Utilize strategies discussed 3. Referral(s): Integrated Behavioral Health Services (In Clinic) 4. "From scale of 1-10, how likely are you to follow plan?":   Bridgett Larsson, LCSW 09/07/2019 6:31 AM

## 2019-09-15 ENCOUNTER — Ambulatory Visit: Payer: Self-pay

## 2019-09-15 ENCOUNTER — Other Ambulatory Visit: Payer: Self-pay

## 2019-09-15 ENCOUNTER — Other Ambulatory Visit: Payer: Self-pay | Admitting: Pharmacist

## 2019-09-15 MED ORDER — PANTOPRAZOLE SODIUM 40 MG PO TBEC: 40.0000 mg | DELAYED_RELEASE_TABLET | Freq: Every day | ORAL | 0 refills | Status: DC

## 2019-09-16 MED FILL — PANTOPRAZOLE SOD DR 40 MG T: 40 | 30 days supply | Qty: 30 | Fill #0

## 2019-09-19 MED FILL — SPIRONOLACTONE 50 MG TABLET: 50 | 30 days supply | Qty: 30 | Fill #1

## 2019-09-19 MED FILL — FUROSEMIDE 20 MG TABS: 20 | 30 days supply | Qty: 30 | Fill #1

## 2019-09-21 ENCOUNTER — Telehealth: Payer: Self-pay | Admitting: Internal Medicine

## 2019-09-21 DIAGNOSIS — K7031 Alcoholic cirrhosis of liver with ascites: Secondary | ICD-10-CM

## 2019-09-21 NOTE — Telephone Encounter (Signed)
Pt is needing a GI referral for his descended belly

## 2019-09-21 NOTE — Telephone Encounter (Signed)
Please advise   Copied from CRM [id number]. Topic: Referral - Request for Referral >> Sep 19, 2019  1:59 PM Wyonia Hough E wrote: Has patient seen PCP for this complaint? yes *If NO, is insurance requiring patient see PCP for this issue before PCP can refer them? Referral for which specialty: radiology Preferred provider/office:  reason for referral: for drainage / stomach pain

## 2019-09-21 NOTE — Telephone Encounter (Signed)
Pt states she owes the Dr. Dulce Sellar office money and he is not able to pay. Pt states he is wanting to know if pcp can put the order in for him to get it done. Pt states if pcp is not able to put order in he will have to go to the ED

## 2019-09-27 ENCOUNTER — Other Ambulatory Visit: Payer: Self-pay

## 2019-09-27 ENCOUNTER — Ambulatory Visit: Payer: Self-pay | Attending: Internal Medicine | Admitting: Internal Medicine

## 2019-09-27 DIAGNOSIS — K7031 Alcoholic cirrhosis of liver with ascites: Secondary | ICD-10-CM

## 2019-09-27 NOTE — Progress Notes (Signed)
Virtual Visit via Telephone Note Due to current restrictions/limitations of in-office visits due to the COVID-19 pandemic, this scheduled clinical appointment was converted to a telehealth visit  I connected with Thomas Atkins on 09/27/19 at 4:51 p.m by telephone and verified that I am speaking with the correct person using two identifiers.  I am in my office.  The patient is at home.  Only the patient and myself participated in this encounter.  I discussed the limitations, risks, security and privacy concerns of performing an evaluation and management service by telephone and the availability of in person appointments. I also discussed with the patient that there may be a patient responsible charge related to this service. The patient expressed understanding and agreed to proceed.   History of Present Illness: Patient with history ofHTN,HIV,alcohol use disorder,tob dep, anx/dep, ?seizure disorder.  Alcoholic liver ds:  No swelling in legs.  Compliant with Furosemide and  Spironolactone.  He reports having significant ascites.  Last paracentesis was over a month ago.  We have set him up for paracentesis tomorrow.  Reports that he is doing good and staying off of alcohol.  He is waiting to get approved for Medicaid.  HIV: He is following regularly with infectious disease.  Reports that his viral load is now undetectable.   Outpatient Encounter Medications as of 09/27/2019  Medication Sig  . Ascorbic Acid (VITAMIN C PO) Take 1 tablet by mouth daily.  . Aspirin-Salicylamide-Caffeine (BC HEADACHE POWDER PO) Take 1 packet by mouth 2 (two) times daily as needed (pain/headache).  . bictegravir-emtricitabine-tenofovir AF (BIKTARVY) 50-200-25 MG TABS tablet Take 1 tablet by mouth daily.  . Bismuth Subsalicylate (PEPTO-BISMOL PO) Take 1 tablet by mouth daily as needed (heartburn/indigestion).  . cefpodoxime (VANTIN) 200 MG tablet Take 1 tablet (200 mg total) by mouth 2 (two) times daily.  .  furosemide (LASIX) 20 MG tablet Take 1 tablet (20 mg total) by mouth daily. (Patient taking differently: Take 20 mg by mouth daily at 12 noon. )  . Multiple Vitamin (MULTIVITAMIN WITH MINERALS) TABS tablet Take 1 tablet by mouth daily.  . pantoprazole (PROTONIX) 40 MG tablet Take 1 tablet (40 mg total) by mouth daily.  . Phenazopyridine HCl (AZO TABS PO) Take 1 tablet by mouth daily as needed (urinary tract pain).  . Sennosides (EX-LAX PO) Take 1 tablet by mouth daily as needed (constipation).  . Simethicone (GAS-X PO) Take 1 tablet by mouth daily as needed (flatulence).  Marland Kitchen spironolactone (ALDACTONE) 50 MG tablet Take 1 tablet (50 mg total) by mouth daily. (Patient taking differently: Take 50 mg by mouth daily at 12 noon. )  . tamsulosin (FLOMAX) 0.4 MG CAPS capsule Take 1 capsule (0.4 mg total) by mouth daily. (Patient taking differently: Take 0.4 mg by mouth daily at 12 noon. )  . thiamine 100 MG tablet Take 1 tablet (100 mg total) by mouth daily.  . traZODone (DESYREL) 100 MG tablet Take 1.5 tablets (150 mg total) by mouth at bedtime as needed for sleep.   No facility-administered encounter medications on file as of 09/27/2019.      Observations/Objective: No direct observation done as this was a telephone encounter.  Assessment and Plan: 1. Alcoholic cirrhosis of liver with ascites (HCC) Continue furosemide and spironolactone. We will do paracentesis intermittently as needed. Continue to encourage him to remain free of alcohol. - PT AND PTT   Follow Up Instructions: 2 mth   I discussed the assessment and treatment plan with the patient. The patient  was provided an opportunity to ask questions and all were answered. The patient agreed with the plan and demonstrated an understanding of the instructions.   The patient was advised to call back or seek an in-person evaluation if the symptoms worsen or if the condition fails to improve as anticipated.  I provided 6 minutes of  non-face-to-face time during this encounter.   Thomas Blue, MD

## 2019-09-28 ENCOUNTER — Other Ambulatory Visit: Payer: Self-pay

## 2019-09-28 ENCOUNTER — Ambulatory Visit (HOSPITAL_COMMUNITY)
Admission: RE | Admit: 2019-09-28 | Discharge: 2019-09-28 | Disposition: A | Discharge status: Home / Self Care | Payer: Self-pay | Source: Ambulatory Visit | Attending: Internal Medicine | Admitting: Internal Medicine

## 2019-09-28 ENCOUNTER — Telehealth: Payer: Self-pay | Admitting: Internal Medicine

## 2019-09-28 ENCOUNTER — Other Ambulatory Visit: Payer: Self-pay | Admitting: Internal Medicine

## 2019-09-28 DIAGNOSIS — K7031 Alcoholic cirrhosis of liver with ascites: Secondary | ICD-10-CM

## 2019-09-28 LAB — PT AND PTT
INR: 1.1 (ref 0.9–1.2)
Prothrombin Time: 12 s (ref 9.1–12.0)
aPTT: 34 s — ABNORMAL HIGH (ref 24–33)

## 2019-09-28 MED ORDER — LIDOCAINE HCL 1 % IJ SOLN
INTRAMUSCULAR | Status: AC
  Filled 2019-09-28: qty 20

## 2019-09-28 NOTE — Progress Notes (Signed)
Patient presented to IR today for therapeutic paracentesis due to worsening abdominal distention. Patient reports his abdomen is tender whenever he bumps it or sits upright. He has been having small, hard bowel movements with some blood which he attributes to known hemorrhoids. He is unable to tolerate more than small amounts of PO intake at a time due to early satiety and abdominal pain although his appetite is good. He has been compliant with all prescribed medications.  Limited abdominal US today shows trace perihepatic ascites, hepatomegaly and diffuse soft tissue edema throughout the abdomen. There is no safe window for paracentesis today. Images reviewed today with patient who states understanding, although is frustrated that we are unable to perform a paracentesis and states he is confused because he was told that all of his labs were fine and that his liver was "slow but did not have cirrhosis and looked normal." He was encouraged to continue to take his medications as prescribed and follow up with his PCP/GI specialist to discuss his care further.  No procedure performed today - [place] images from today's encounter are available for review under the imaging section of Epic. Please call with any questions or concerns.  Lynnette Caffey, PA-C

## 2019-09-28 NOTE — Telephone Encounter (Signed)
Phone call placed to patient this a.m.  I informed him that I received results of ultrasound that was done by interventional radiology prior to attempt of paracentesis.  According to the note there was very little ascitic fluid and certainly not enough to be safely tapped.  Patient states he was told that the liver is extremely enlarged.  He has been speaking with someone in [place] trying to get his Medicaid approved.  I told him that I will write a letter in support of him getting Medicaid so that he can continue to see the gastroenterologist.  He will either pick up the letter tomorrow or call us with a name and fax number of whom to send the letter to.

## 2019-09-29 NOTE — Telephone Encounter (Signed)
Contacted pt and made aware that letter is ready for pick up. Pt states he is unable to pick up letter today. Pt states he will pick up letter tomorrow

## 2019-10-11 ENCOUNTER — Inpatient Hospital Stay (HOSPITAL_COMMUNITY): Payer: Self-pay

## 2019-10-11 ENCOUNTER — Emergency Department (HOSPITAL_COMMUNITY): Payer: Self-pay

## 2019-10-11 ENCOUNTER — Encounter (HOSPITAL_COMMUNITY): Payer: Self-pay | Admitting: Emergency Medicine

## 2019-10-11 ENCOUNTER — Other Ambulatory Visit: Payer: Self-pay

## 2019-10-11 ENCOUNTER — Inpatient Hospital Stay (HOSPITAL_COMMUNITY)
Admission: EM | Admit: 2019-10-11 | Discharge: 2019-10-15 | DRG: 871 | Disposition: A | Discharge status: Home / Self Care | Payer: Self-pay | Attending: Internal Medicine | Admitting: Internal Medicine

## 2019-10-11 DIAGNOSIS — K7210 Chronic hepatic failure without coma: Secondary | ICD-10-CM | POA: Diagnosis present

## 2019-10-11 DIAGNOSIS — N179 Acute kidney failure, unspecified: Secondary | ICD-10-CM | POA: Diagnosis present

## 2019-10-11 DIAGNOSIS — E871 Hypo-osmolality and hyponatremia: Secondary | ICD-10-CM | POA: Diagnosis present

## 2019-10-11 DIAGNOSIS — I5023 Acute on chronic systolic (congestive) heart failure: Secondary | ICD-10-CM

## 2019-10-11 DIAGNOSIS — E8729 Other acidosis: Secondary | ICD-10-CM | POA: Diagnosis present

## 2019-10-11 DIAGNOSIS — F1410 Cocaine abuse, uncomplicated: Secondary | ICD-10-CM | POA: Diagnosis present

## 2019-10-11 DIAGNOSIS — J9690 Respiratory failure, unspecified, unspecified whether with hypoxia or hypercapnia: Secondary | ICD-10-CM | POA: Diagnosis present

## 2019-10-11 DIAGNOSIS — Z21 Asymptomatic human immunodeficiency virus [HIV] infection status: Secondary | ICD-10-CM | POA: Diagnosis present

## 2019-10-11 DIAGNOSIS — E739 Lactose intolerance, unspecified: Secondary | ICD-10-CM | POA: Diagnosis present

## 2019-10-11 DIAGNOSIS — J9601 Acute respiratory failure with hypoxia: Secondary | ICD-10-CM | POA: Diagnosis present

## 2019-10-11 DIAGNOSIS — I5022 Chronic systolic (congestive) heart failure: Secondary | ICD-10-CM | POA: Diagnosis present

## 2019-10-11 DIAGNOSIS — Z79899 Other long term (current) drug therapy: Secondary | ICD-10-CM

## 2019-10-11 DIAGNOSIS — F1010 Alcohol abuse, uncomplicated: Secondary | ICD-10-CM

## 2019-10-11 DIAGNOSIS — Z716 Tobacco abuse counseling: Secondary | ICD-10-CM

## 2019-10-11 DIAGNOSIS — F17210 Nicotine dependence, cigarettes, uncomplicated: Secondary | ICD-10-CM | POA: Diagnosis present

## 2019-10-11 DIAGNOSIS — F1020 Alcohol dependence, uncomplicated: Secondary | ICD-10-CM | POA: Diagnosis present

## 2019-10-11 DIAGNOSIS — Z91030 Bee allergy status: Secondary | ICD-10-CM

## 2019-10-11 DIAGNOSIS — B20 Human immunodeficiency virus [HIV] disease: Secondary | ICD-10-CM

## 2019-10-11 DIAGNOSIS — K7031 Alcoholic cirrhosis of liver with ascites: Secondary | ICD-10-CM | POA: Diagnosis present

## 2019-10-11 DIAGNOSIS — F329 Major depressive disorder, single episode, unspecified: Secondary | ICD-10-CM | POA: Diagnosis present

## 2019-10-11 DIAGNOSIS — K219 Gastro-esophageal reflux disease without esophagitis: Secondary | ICD-10-CM | POA: Diagnosis present

## 2019-10-11 DIAGNOSIS — G40909 Epilepsy, unspecified, not intractable, without status epilepticus: Secondary | ICD-10-CM | POA: Diagnosis present

## 2019-10-11 DIAGNOSIS — Z885 Allergy status to narcotic agent status: Secondary | ICD-10-CM

## 2019-10-11 DIAGNOSIS — N400 Enlarged prostate without lower urinary tract symptoms: Secondary | ICD-10-CM | POA: Diagnosis present

## 2019-10-11 DIAGNOSIS — K5900 Constipation, unspecified: Secondary | ICD-10-CM | POA: Diagnosis present

## 2019-10-11 DIAGNOSIS — R6520 Severe sepsis without septic shock: Secondary | ICD-10-CM | POA: Diagnosis present

## 2019-10-11 DIAGNOSIS — K7460 Unspecified cirrhosis of liver: Secondary | ICD-10-CM

## 2019-10-11 DIAGNOSIS — Z20822 Contact with and (suspected) exposure to covid-19: Secondary | ICD-10-CM | POA: Diagnosis present

## 2019-10-11 DIAGNOSIS — E872 Acidosis: Secondary | ICD-10-CM | POA: Diagnosis present

## 2019-10-11 DIAGNOSIS — E876 Hypokalemia: Secondary | ICD-10-CM | POA: Diagnosis present

## 2019-10-11 DIAGNOSIS — Z7982 Long term (current) use of aspirin: Secondary | ICD-10-CM

## 2019-10-11 DIAGNOSIS — Z818 Family history of other mental and behavioral disorders: Secondary | ICD-10-CM

## 2019-10-11 DIAGNOSIS — J189 Pneumonia, unspecified organism: Secondary | ICD-10-CM | POA: Diagnosis present

## 2019-10-11 DIAGNOSIS — A419 Sepsis, unspecified organism: Principal | ICD-10-CM | POA: Diagnosis present

## 2019-10-11 DIAGNOSIS — Z8249 Family history of ischemic heart disease and other diseases of the circulatory system: Secondary | ICD-10-CM

## 2019-10-11 DIAGNOSIS — I110 Hypertensive heart disease with heart failure: Secondary | ICD-10-CM | POA: Diagnosis present

## 2019-10-11 LAB — COMPREHENSIVE METABOLIC PANEL
ALT: 23 U/L (ref 0–44)
ALT: 21 U/L (ref 0–44)
AST: 46 U/L — ABNORMAL HIGH (ref 15–41)
AST: 44 U/L — ABNORMAL HIGH (ref 15–41)
Albumin: 3.5 g/dL (ref 3.5–5.0)
Albumin: 3.2 g/dL — ABNORMAL LOW (ref 3.5–5.0)
Alkaline Phosphatase: 148 U/L — ABNORMAL HIGH (ref 38–126)
Alkaline Phosphatase: 131 U/L — ABNORMAL HIGH (ref 38–126)
Anion gap: 13 (ref 5–15)
Anion gap: 9 (ref 5–15)
BUN: 5 mg/dL — ABNORMAL LOW (ref 6–20)
BUN: 7 mg/dL (ref 6–20)
CO2: 16 mmol/L — ABNORMAL LOW (ref 22–32)
CO2: 21 mmol/L — ABNORMAL LOW (ref 22–32)
Calcium: 8.9 mg/dL (ref 8.9–10.3)
Calcium: 8.8 mg/dL — ABNORMAL LOW (ref 8.9–10.3)
Chloride: 107 mmol/L (ref 98–111)
Chloride: 107 mmol/L (ref 98–111)
Creatinine, Ser: 0.82 mg/dL (ref 0.61–1.24)
Creatinine, Ser: 0.92 mg/dL (ref 0.61–1.24)
GFR calc Af Amer: >60 mL/min (ref >60)
GFR calc Af Amer: >60 mL/min (ref >60)
GFR calc non Af Amer: >60 mL/min (ref >60)
GFR calc non Af Amer: >60 mL/min (ref >60)
Glucose, Bld: 114 mg/dL — ABNORMAL HIGH (ref 70–99)
Glucose, Bld: 110 mg/dL — ABNORMAL HIGH (ref 70–99)
Potassium: 3.3 mmol/L — ABNORMAL LOW (ref 3.5–5.1)
Potassium: 4 mmol/L (ref 3.5–5.1)
Sodium: 136 mmol/L (ref 135–145)
Sodium: 137 mmol/L (ref 135–145)
Total Bilirubin: 2.5 mg/dL — ABNORMAL HIGH (ref 0.3–1.2)
Total Bilirubin: 2.9 mg/dL — ABNORMAL HIGH (ref 0.3–1.2)
Total Protein: 8.3 g/dL — ABNORMAL HIGH (ref 6.5–8.1)
Total Protein: 7.7 g/dL (ref 6.5–8.1)

## 2019-10-11 LAB — I-STAT ARTERIAL BLOOD GAS, ED
Acid-base deficit: 3 mmol/L — ABNORMAL HIGH (ref 0.0–2.0)
Bicarbonate: 21.1 mmol/L (ref 20.0–28.0)
Calcium, Ion: 1.2 mmol/L (ref 1.15–1.40)
HCT: 35 % — ABNORMAL LOW (ref 39.0–52.0)
Hemoglobin: 11.9 g/dL — ABNORMAL LOW (ref 13.0–17.0)
O2 Saturation: 99 %
Patient temperature: 99.2
Potassium: 3.6 mmol/L (ref 3.5–5.1)
Sodium: 142 mmol/L (ref 135–145)
TCO2: 22 mmol/L (ref 22–32)
pCO2 arterial: 35.5 mmHg (ref 32.0–48.0)
pH, Arterial: 7.384 (ref 7.350–7.450)
pO2, Arterial: 163 mmHg — ABNORMAL HIGH (ref 83.0–108.0)

## 2019-10-11 LAB — URINALYSIS, COMPLETE (UACMP) WITH MICROSCOPIC
Bacteria, UA: NONE SEEN
Bilirubin Urine: NEGATIVE
Glucose, UA: NEGATIVE mg/dL
Hgb urine dipstick: NEGATIVE
Ketones, ur: NEGATIVE mg/dL
Leukocytes,Ua: NEGATIVE
Nitrite: NEGATIVE
Protein, ur: NEGATIVE mg/dL
Specific Gravity, Urine: 1.043 — ABNORMAL HIGH (ref 1.005–1.030)
pH: 6 (ref 5.0–8.0)

## 2019-10-11 LAB — SARS CORONAVIRUS 2 BY RT PCR (HOSPITAL ORDER, PERFORMED IN ~~LOC~~ HOSPITAL LAB): SARS Coronavirus 2: NEGATIVE

## 2019-10-11 LAB — CBC WITH DIFFERENTIAL/PLATELET
Abs Immature Granulocytes: 0.08 10*3/uL — ABNORMAL HIGH (ref 0.00–0.07)
Abs Immature Granulocytes: 0.18 10*3/uL — ABNORMAL HIGH (ref 0.00–0.07)
Basophils Absolute: 0.1 10*3/uL (ref 0.0–0.1)
Basophils Absolute: 0.1 10*3/uL (ref 0.0–0.1)
Basophils Relative: 0 %
Basophils Relative: 0 %
Eosinophils Absolute: 0.1 10*3/uL (ref 0.0–0.5)
Eosinophils Absolute: 0.1 10*3/uL (ref 0.0–0.5)
Eosinophils Relative: 0 %
Eosinophils Relative: 0 %
HCT: 37.6 % — ABNORMAL LOW (ref 39.0–52.0)
HCT: 37.3 % — ABNORMAL LOW (ref 39.0–52.0)
Hemoglobin: 12.5 g/dL — ABNORMAL LOW (ref 13.0–17.0)
Hemoglobin: 12.1 g/dL — ABNORMAL LOW (ref 13.0–17.0)
Immature Granulocytes: 1 %
Immature Granulocytes: 1 %
Lymphocytes Relative: 5 %
Lymphocytes Relative: 8 %
Lymphs Abs: 0.8 10*3/uL (ref 0.7–4.0)
Lymphs Abs: 1.3 10*3/uL (ref 0.7–4.0)
MCH: 32.4 pg (ref 26.0–34.0)
MCH: 32.1 pg (ref 26.0–34.0)
MCHC: 33.2 g/dL (ref 30.0–36.0)
MCHC: 32.4 g/dL (ref 30.0–36.0)
MCV: 97.4 fL (ref 80.0–100.0)
MCV: 98.9 fL (ref 80.0–100.0)
Monocytes Absolute: 1 10*3/uL (ref 0.1–1.0)
Monocytes Absolute: 1.1 10*3/uL — ABNORMAL HIGH (ref 0.1–1.0)
Monocytes Relative: 7 %
Monocytes Relative: 7 %
Neutro Abs: 13.2 10*3/uL — ABNORMAL HIGH (ref 1.7–7.7)
Neutro Abs: 13.1 10*3/uL — ABNORMAL HIGH (ref 1.7–7.7)
Neutrophils Relative %: 87 %
Neutrophils Relative %: 84 %
Platelets: 192 10*3/uL (ref 150–400)
Platelets: 164 10*3/uL (ref 150–400)
RBC: 3.86 MIL/uL — ABNORMAL LOW (ref 4.22–5.81)
RBC: 3.77 MIL/uL — ABNORMAL LOW (ref 4.22–5.81)
RDW: 16.9 % — ABNORMAL HIGH (ref 11.5–15.5)
RDW: 16.9 % — ABNORMAL HIGH (ref 11.5–15.5)
WBC: 15.2 10*3/uL — ABNORMAL HIGH (ref 4.0–10.5)
WBC: 15.8 10*3/uL — ABNORMAL HIGH (ref 4.0–10.5)
nRBC: 0 % (ref 0.0–0.2)
nRBC: 0 % (ref 0.0–0.2)

## 2019-10-11 LAB — RAPID URINE DRUG SCREEN, HOSP PERFORMED
Amphetamines: NOT DETECTED
Barbiturates: NOT DETECTED
Benzodiazepines: NOT DETECTED
Cocaine: POSITIVE — AB
Opiates: NOT DETECTED
Tetrahydrocannabinol: NOT DETECTED

## 2019-10-11 LAB — URINALYSIS, ROUTINE W REFLEX MICROSCOPIC
Bilirubin Urine: NEGATIVE
Glucose, UA: NEGATIVE mg/dL
Hgb urine dipstick: NEGATIVE
Ketones, ur: NEGATIVE mg/dL
Leukocytes,Ua: NEGATIVE
Nitrite: NEGATIVE
Protein, ur: 30 mg/dL — AB
Specific Gravity, Urine: 1.017 (ref 1.005–1.030)
pH: 5 (ref 5.0–8.0)

## 2019-10-11 LAB — CBG MONITORING, ED
Glucose-Capillary: 106 mg/dL — ABNORMAL HIGH (ref 70–99)
Glucose-Capillary: 106 mg/dL — ABNORMAL HIGH (ref 70–99)

## 2019-10-11 LAB — ECHOCARDIOGRAM COMPLETE
Area-P 1/2: 2.66 cm2
Height: 73 in
S' Lateral: 3.7 cm
Weight: 3245.17 oz

## 2019-10-11 LAB — LACTIC ACID, PLASMA
Lactic Acid, Venous: 2.2 mmol/L (ref 0.5–1.9)
Lactic Acid, Venous: 1.2 mmol/L (ref 0.5–1.9)

## 2019-10-11 LAB — PROCALCITONIN: Procalcitonin: 1.22 ng/mL

## 2019-10-11 LAB — PROTIME-INR
INR: 1.5 — ABNORMAL HIGH (ref 0.8–1.2)
Prothrombin Time: 17.1 seconds — ABNORMAL HIGH (ref 11.4–15.2)

## 2019-10-11 LAB — APTT: aPTT: 42 seconds — ABNORMAL HIGH (ref 24–36)

## 2019-10-11 LAB — LIPASE, BLOOD: Lipase: 31 U/L (ref 11–51)

## 2019-10-11 LAB — ETHANOL: Alcohol, Ethyl (B): <10 mg/dL (ref <10)

## 2019-10-11 LAB — BRAIN NATRIURETIC PEPTIDE: B Natriuretic Peptide: 37.2 pg/mL (ref 0.0–100.0)

## 2019-10-11 LAB — C-REACTIVE PROTEIN: CRP: 2.8 mg/dL — ABNORMAL HIGH (ref <1.0)

## 2019-10-11 MED ORDER — SODIUM CHLORIDE 0.9 % IV SOLN
2.0000 g | Freq: Once | INTRAVENOUS | Status: AC
  Administered 2019-10-11: 2 g via INTRAVENOUS
  Filled 2019-10-11: qty 2

## 2019-10-11 MED ORDER — FOLIC ACID 1 MG PO TABS
1.0000 mg | ORAL_TABLET | Freq: Every day | ORAL | Status: DC
  Administered 2019-10-11 – 2019-10-15 (×5): 1 mg via ORAL
  Filled 2019-10-11 (×6): qty 1

## 2019-10-11 MED ORDER — LORAZEPAM 2 MG/ML IJ SOLN
1.0000 mg | Freq: Once | INTRAMUSCULAR | Status: AC
  Administered 2019-10-11: 1 mg via INTRAVENOUS
  Filled 2019-10-11: qty 1

## 2019-10-11 MED ORDER — SODIUM CHLORIDE 0.9 % IV BOLUS
1000.0000 mL | Freq: Once | INTRAVENOUS | Status: AC
  Administered 2019-10-11: 1000 mL via INTRAVENOUS

## 2019-10-11 MED ORDER — POLYETHYLENE GLYCOL 3350 17 G PO PACK: 17.0000 g | PACK | Freq: Every day | ORAL | Status: DC | PRN

## 2019-10-11 MED ORDER — THIAMINE HCL 100 MG/ML IJ SOLN
100.0000 mg | Freq: Every day | INTRAMUSCULAR | Status: DC
  Administered 2019-10-11: 100 mg via INTRAVENOUS
  Filled 2019-10-11 (×2): qty 2

## 2019-10-11 MED ORDER — IBUPROFEN 800 MG PO TABS
800.0000 mg | ORAL_TABLET | Freq: Once | ORAL | Status: AC
  Administered 2019-10-11: 800 mg via ORAL
  Filled 2019-10-11: qty 1

## 2019-10-11 MED ORDER — ENOXAPARIN SODIUM 40 MG/0.4ML ~~LOC~~ SOLN
40.0000 mg | SUBCUTANEOUS | Status: DC
  Administered 2019-10-11 – 2019-10-15 (×5): 40 mg via SUBCUTANEOUS
  Filled 2019-10-11 (×5): qty 0.4

## 2019-10-11 MED ORDER — IOHEXOL 350 MG/ML SOLN
100.0000 mL | Freq: Once | INTRAVENOUS | Status: AC | PRN
  Administered 2019-10-11: 100 mL via INTRAVENOUS

## 2019-10-11 MED ORDER — LORAZEPAM 1 MG PO TABS
1.0000 mg | ORAL_TABLET | ORAL | Status: AC | PRN
  Administered 2019-10-11 – 2019-10-13 (×3): 1 mg via ORAL
  Administered 2019-10-13: 2 mg via ORAL
  Filled 2019-10-11 (×3): qty 1
  Filled 2019-10-11: qty 2

## 2019-10-11 MED ORDER — ACETAMINOPHEN 325 MG PO TABS
650.0000 mg | ORAL_TABLET | Freq: Four times a day (QID) | ORAL | Status: DC | PRN
  Administered 2019-10-13 – 2019-10-15 (×2): 650 mg via ORAL
  Filled 2019-10-11 (×2): qty 2

## 2019-10-11 MED ORDER — ONDANSETRON HCL 4 MG/2ML IJ SOLN: 4.0000 mg | Freq: Four times a day (QID) | INTRAMUSCULAR | Status: DC | PRN

## 2019-10-11 MED ORDER — VANCOMYCIN HCL IN DEXTROSE 1-5 GM/200ML-% IV SOLN
1000.0000 mg | Freq: Once | INTRAVENOUS | Status: AC
  Administered 2019-10-11: 1000 mg via INTRAVENOUS
  Filled 2019-10-11: qty 200

## 2019-10-11 MED ORDER — TRAZODONE HCL 100 MG PO TABS
200.0000 mg | ORAL_TABLET | Freq: Every evening | ORAL | Status: DC | PRN
  Administered 2019-10-11 – 2019-10-14 (×4): 200 mg via ORAL
  Filled 2019-10-11 (×4): qty 2

## 2019-10-11 MED ORDER — PANTOPRAZOLE SODIUM 40 MG PO TBEC
40.0000 mg | DELAYED_RELEASE_TABLET | Freq: Every day | ORAL | Status: DC
  Administered 2019-10-11 – 2019-10-15 (×5): 40 mg via ORAL
  Filled 2019-10-11 (×5): qty 1

## 2019-10-11 MED ORDER — ADULT MULTIVITAMIN W/MINERALS CH
1.0000 | ORAL_TABLET | Freq: Every day | ORAL | Status: DC
  Administered 2019-10-11 – 2019-10-15 (×5): 1 via ORAL
  Filled 2019-10-11 (×5): qty 1

## 2019-10-11 MED ORDER — SODIUM CHLORIDE 0.9 % IV SOLN
500.0000 mg | INTRAVENOUS | Status: DC
  Administered 2019-10-11 – 2019-10-14 (×4): 500 mg via INTRAVENOUS
  Filled 2019-10-11 (×4): qty 500

## 2019-10-11 MED ORDER — LORAZEPAM 2 MG/ML IJ SOLN
1.0000 mg | INTRAMUSCULAR | Status: AC | PRN
  Administered 2019-10-12: 2 mg via INTRAVENOUS
  Filled 2019-10-11: qty 1

## 2019-10-11 MED ORDER — SPIRONOLACTONE 25 MG PO TABS: 50.0000 mg | ORAL_TABLET | Freq: Every day | ORAL | Status: DC

## 2019-10-11 MED ORDER — VANCOMYCIN HCL IN DEXTROSE 1-5 GM/200ML-% IV SOLN
1000.0000 mg | Freq: Three times a day (TID) | INTRAVENOUS | Status: DC
  Administered 2019-10-11 – 2019-10-13 (×5): 1000 mg via INTRAVENOUS
  Filled 2019-10-11 (×6): qty 200

## 2019-10-11 MED ORDER — NICOTINE 7 MG/24HR TD PT24
7.0000 mg | MEDICATED_PATCH | Freq: Every day | TRANSDERMAL | Status: DC
  Administered 2019-10-12 – 2019-10-15 (×4): 7 mg via TRANSDERMAL
  Filled 2019-10-11 (×4): qty 1

## 2019-10-11 MED ORDER — ONDANSETRON HCL 4 MG PO TABS: 4.0000 mg | ORAL_TABLET | Freq: Four times a day (QID) | ORAL | Status: DC | PRN

## 2019-10-11 MED ORDER — FUROSEMIDE 20 MG PO TABS
20.0000 mg | ORAL_TABLET | Freq: Every day | ORAL | Status: DC
  Administered 2019-10-11: 20 mg via ORAL
  Filled 2019-10-11: qty 1

## 2019-10-11 MED ORDER — TAMSULOSIN HCL 0.4 MG PO CAPS
0.4000 mg | ORAL_CAPSULE | Freq: Every day | ORAL | Status: DC
  Administered 2019-10-11 – 2019-10-15 (×5): 0.4 mg via ORAL
  Filled 2019-10-11 (×6): qty 1

## 2019-10-11 MED ORDER — ACETAMINOPHEN 650 MG RE SUPP: 650.0000 mg | Freq: Four times a day (QID) | RECTAL | Status: DC | PRN

## 2019-10-11 MED ORDER — SPIRONOLACTONE 25 MG PO TABS
50.0000 mg | ORAL_TABLET | Freq: Every day | ORAL | Status: DC
  Filled 2019-10-11: qty 2

## 2019-10-11 MED ORDER — ADULT MULTIVITAMIN W/MINERALS CH: 1.0000 | ORAL_TABLET | Freq: Every day | ORAL | Status: DC

## 2019-10-11 MED ORDER — SODIUM CHLORIDE 0.9 % IV SOLN
2.0000 g | Freq: Three times a day (TID) | INTRAVENOUS | Status: DC
  Administered 2019-10-11 – 2019-10-15 (×12): 2 g via INTRAVENOUS
  Filled 2019-10-11 (×15): qty 2

## 2019-10-11 MED ORDER — BICTEGRAVIR-EMTRICITAB-TENOFOV 50-200-25 MG PO TABS
1.0000 | ORAL_TABLET | Freq: Every day | ORAL | Status: DC
  Administered 2019-10-11 – 2019-10-15 (×5): 1 via ORAL
  Filled 2019-10-11 (×5): qty 1

## 2019-10-11 MED ORDER — POTASSIUM CHLORIDE CRYS ER 20 MEQ PO TBCR
40.0000 meq | EXTENDED_RELEASE_TABLET | Freq: Once | ORAL | Status: AC
  Administered 2019-10-11: 40 meq via ORAL
  Filled 2019-10-11: qty 2

## 2019-10-11 MED ORDER — ONDANSETRON HCL 4 MG/2ML IJ SOLN
4.0000 mg | Freq: Once | INTRAMUSCULAR | Status: AC
  Administered 2019-10-11: 4 mg via INTRAVENOUS
  Filled 2019-10-11: qty 2

## 2019-10-11 MED ORDER — NICOTINE 21 MG/24HR TD PT24
21.0000 mg | MEDICATED_PATCH | Freq: Every day | TRANSDERMAL | Status: DC
  Administered 2019-10-11: 21 mg via TRANSDERMAL
  Filled 2019-10-11: qty 1

## 2019-10-11 MED ORDER — FUROSEMIDE 10 MG/ML IJ SOLN
40.0000 mg | Freq: Two times a day (BID) | INTRAMUSCULAR | Status: DC
  Administered 2019-10-11 – 2019-10-12 (×4): 40 mg via INTRAVENOUS
  Filled 2019-10-11 (×4): qty 4

## 2019-10-11 MED ORDER — THIAMINE HCL 100 MG PO TABS
100.0000 mg | ORAL_TABLET | Freq: Every day | ORAL | Status: DC
  Administered 2019-10-11 – 2019-10-15 (×5): 100 mg via ORAL
  Filled 2019-10-11 (×6): qty 1

## 2019-10-11 NOTE — Progress Notes (Signed)
Pharmacy Antibiotic Note  Thomas Atkins is a 44 y.o. male admitted on 10/11/2019 with pneumonia.  Pharmacy has been consulted for vancomycin and cefepime dosing. Pt is afebrile but WBC is elevated at 15.2. Lactic acid has now normalized. Pt with history of HIV.   Plan: Cefepime 2gm IV Q8H Give additional vancomycin 1gm IV x 1 now for a load of 2gm then start 1gm IV Q8H F/u renal fxn, C&S, clinical status and trough at SS  Height: 6\' 1"  (185.4 cm) Weight: 92 kg (202 lb 13.2 oz) IBW/kg (Calculated) : 79.9  Temp (24hrs), Avg:99.2 F (37.3 C), Min:99.2 F (37.3 C), Max:99.2 F (37.3 C)  Recent Labs  Lab 10/11/19 0136 10/11/19 0327  WBC 15.2*  --   CREATININE 0.82  --   LATICACIDVEN 2.2* 1.2    Estimated Creatinine Clearance: 127.2 mL/min (by C-G formula based on SCr of 0.82 mg/dL).    Allergies  Allergen Reactions  . Bee Venom Anaphylaxis  . Lactose Intolerance (Gi) Diarrhea, Nausea Only and Other (See Comments)    Abdominal pain    . Ultram [Tramadol Hcl] Hives         Antimicrobials this admission: Vanc 8/3>> Cefepime 8/3>> Azithro 8/3>>  Dose adjustments this admission: N/A  Microbiology results: Pending  Thank you for allowing pharmacy to be a part of this patient's care.  Tashara Suder, 12/11/19 10/11/2019 7:39 AM

## 2019-10-11 NOTE — ED Notes (Signed)
Urine culture attached to sample  °

## 2019-10-11 NOTE — ED Provider Notes (Signed)
[facility]   CSN: [id number] Arrival date & time: 10/11/19  0117     History Chief Complaint  Patient presents with  . Fever/Emesis/Chills/SOB    Thomas Atkins is a 44 y.o. male hx of HIV (nl CD 4 in 6/21), alcoholism, here presenting with shortness of breath, fever, abdominal pain.  Patient states that he recently traveled to [place] to visit friends.  He came back recently and started having some shortness of breath.  He also states that he has been having abdominal distention and pain as well.  Patient has a history of cirrhosis and did drink some alcohol.  He feels tremulous.  Patient did get 1 shot of his Pfizer vaccine but did not finish his second shot.  Denies any known contacts with Covid.  The history is provided by the patient.       Past Medical History:  Diagnosis Date  . Alcoholism (HCC)   . Anxiety   . Depression   . HIV infection (HCC)   . Seizure disorder (HCC)   . Syncope     Patient Active Problem List   Diagnosis Date Noted  . Abdominal pain 08/27/2019  . Alcoholic cirrhosis of liver with ascites (HCC) 08/27/2019  . Healthcare maintenance 08/03/2019  . Rash and nonspecific skin eruption 07/22/2019  . Ascites of liver 07/04/2019  . Constipation 06/16/2019  . Asymptomatic HIV infection (HCC) 06/16/2019  . GI bleed 05/26/2019  . History of hypertension 07/31/2017  . Alcohol use disorder, moderate, dependence (HCC) 07/31/2017  . Tobacco dependence 07/31/2017  . Alcohol-induced insomnia (HCC) 07/31/2017  . Major depressive disorder, recurrent severe without psychotic features (HCC) 10/10/2015  . Alcohol abuse with alcohol-induced mood disorder (HCC) 05/31/2015    Past Surgical History:  Procedure Laterality Date  . IR PARACENTESIS  07/01/2019  . IR PARACENTESIS  07/15/2019  . IR PARACENTESIS  07/27/2019  . IR PARACENTESIS  08/03/2019  . IR PARACENTESIS  08/10/2019  . IR PARACENTESIS  08/24/2019  .  SURGERY SCROTAL / TESTICULAR         Family History  Problem Relation Age of Onset  . Heart disease Paternal Grandmother   . Bipolar disorder Father     Social History   Tobacco Use  . Smoking status: Current Every Day Smoker    Packs/day: 1.00    Years: 30.00    Pack years: 30.00    Types: Cigarettes  . Smokeless tobacco: Never Used  . Tobacco comment: Patient refused cessation materials  Vaping Use  . Vaping Use: Never used  Substance Use Topics  . Alcohol use: Yes    Alcohol/week: 24.0 standard drinks    Types: 24 Cans of beer per week    Comment: 6 pack per day  . Drug use: Yes    Types: Marijuana    Comment: on occasion    Home Medications Prior to Admission medications   Medication Sig Start Date End Date Taking? Authorizing Provider  Aspirin-Salicylamide-Caffeine (BC HEADACHE POWDER PO) Take 1 packet by mouth 2 (two) times daily as needed (pain/headache).   Yes [provider]  bictegravir-emtricitabine-tenofovir AF (BIKTARVY) 50-200-25 MG TABS tablet Take 1 tablet by mouth daily. 09/01/19  Yes Veryl Speakalone, Gregory D, FNP  Bismuth Subsalicylate (PEPTO-BISMOL PO) Take 1 tablet by mouth daily as needed (heartburn/indigestion).   Yes [provider]  furosemide (LASIX) 20 MG tablet Take 1 tablet (20 mg total) by mouth daily. Patient taking differently: Take 20 mg by  mouth daily at 12 noon.  08/09/19  Yes [name], MD  Multiple Vitamin (MULTIVITAMIN WITH MINERALS) TABS tablet Take 1 tablet by mouth daily. 08/29/19  Yes Azucena Fallen, MD  pantoprazole (PROTONIX) 40 MG tablet Take 1 tablet (40 mg total) by mouth daily. 09/15/19 09/14/20 Yes [name], MD  Sennosides (EX-LAX PO) Take 1 tablet by mouth daily as needed (constipation).   Yes [provider]  spironolactone (ALDACTONE) 50 MG tablet Take 1 tablet (50 mg total) by mouth daily. Patient taking differently: Take 50 mg by mouth daily at 12 noon.  08/09/19  Yes [name], MD  tamsulosin (FLOMAX) 0.4 MG CAPS capsule Take 1 capsule (0.4 mg total) by mouth daily. Patient taking differently: Take 0.4 mg by mouth daily at 12 noon.  04/07/19  Yes [name], MD  traZODone (DESYREL) 100 MG tablet Take 1.5 tablets (150 mg total) by mouth at bedtime as needed for sleep. Patient taking differently: Take 200 mg by mouth at bedtime.  04/07/19  Yes [name], MD  thiamine 100 MG tablet Take 1 tablet (100 mg total) by mouth daily. Patient not taking: Reported on 10/11/2019 08/29/19   Azucena Fallen, MD    Allergies    Bee venom, Lactose intolerance (gi), and Ultram [tramadol hcl]  Review of Systems   Review of Systems  Constitutional: Positive for fever.  Respiratory: Positive for cough and shortness of breath.   Gastrointestinal: Positive for abdominal distention.  All other systems reviewed and are negative.   Physical Exam Updated Vital Signs BP 102/79 (BP Location: Right Arm) Comment: BP cuff elevated  Pulse (!) 110   Temp 99.2 F (37.3 C) (Oral)   Resp (!) 40   Ht 6\' 1"  (1.854 m)   Wt 92 kg   SpO2 (!) 87% Comment: MD at bedside  BMI 26.76 kg/m   Physical Exam Vitals and nursing note reviewed.  Constitutional:      Comments: Tremulous, uncomfortable.  HENT:     Head: Normocephalic.     Nose: Nose normal.     Mouth/Throat:     Mouth: Mucous membranes are dry.  Eyes:     Extraocular Movements: Extraocular movements intact.     Pupils: Pupils are equal, round, and reactive to light.  Cardiovascular:     Rate and Rhythm: Regular rhythm. Tachycardia present.     Pulses: Normal pulses.     Heart sounds: Normal heart sounds.  Pulmonary:     Effort: Pulmonary effort is normal.     Comments: Diminished bilaterally. Abdominal:     Comments: Distended, positive fluid wave, mild diffuse tenderness.  Musculoskeletal:        General: Normal range of motion.     Cervical back: Normal range of motion.  Skin:    General: Skin is  warm.     Capillary Refill: Capillary refill takes less than 2 seconds.  Neurological:     General: No focal deficit present.     Mental Status: He is oriented to person, place, and time.  Psychiatric:        Mood and Affect: Mood normal.        Behavior: Behavior normal.     ED Results / Procedures / Treatments   Labs (all labs ordered are listed, but only abnormal results are displayed) Labs Reviewed  CBC WITH DIFFERENTIAL/PLATELET - Abnormal; Notable for the following components:      Result Value   WBC  15.2 (*)    RBC 3.86 (*)    Hemoglobin 12.5 (*)    HCT 37.6 (*)    RDW 16.9 (*)    Neutro Abs 13.2 (*)    Abs Immature Granulocytes 0.08 (*)    All other components within normal limits  COMPREHENSIVE METABOLIC PANEL - Abnormal; Notable for the following components:   Potassium 3.3 (*)    CO2 16 (*)    Glucose, Bld 114 (*)    BUN 5 (*)    Total Protein 8.3 (*)    AST 46 (*)    Alkaline Phosphatase 148 (*)    Total Bilirubin 2.5 (*)    All other components within normal limits  URINALYSIS, ROUTINE W REFLEX MICROSCOPIC - Abnormal; Notable for the following components:   Color, Urine AMBER (*)    Protein, ur 30 (*)    Bacteria, UA RARE (*)    All other components within normal limits  LACTIC ACID, PLASMA - Abnormal; Notable for the following components:   Lactic Acid, Venous 2.2 (*)    All other components within normal limits  SARS CORONAVIRUS 2 BY RT PCR (HOSPITAL ORDER, PERFORMED IN [facility])  CULTURE, BLOOD (ROUTINE X 2)  CULTURE, BLOOD (ROUTINE X 2)  ETHANOL  LIPASE, BLOOD  LACTIC ACID, PLASMA  BLOOD GAS, VENOUS    EKG EKG Interpretation  Date/Time:  Tuesday October 11 2019 01:23:26 EDT Ventricular Rate:  135 PR Interval:  144 QRS Duration: 90 QT Interval:  294 QTC Calculation: 441 R Axis:   -113 Text Interpretation: Sinus tachycardia with Fusion complexes Biatrial enlargement Right superior axis deviation Right ventricular hypertrophy  Anterior infarct , age undetermined Abnormal ECG Since last tracing rate faster Confirmed by Richardean Canal [phone]) on 10/11/2019 1:33:56 AM   Radiology CT Angio Chest PE W and/or Wo Contrast  Result Date: 10/11/2019 CLINICAL DATA:  High probability of pulmonary embolus. Abdominal pain and fever. Shortness of breath. EXAM: CT ANGIOGRAPHY CHEST CT ABDOMEN AND PELVIS WITH CONTRAST TECHNIQUE: Multidetector CT imaging of the chest was performed using the standard protocol during bolus administration of intravenous contrast. Multiplanar CT image reconstructions and MIPs were obtained to evaluate the vascular anatomy. Multidetector CT imaging of the abdomen and pelvis was performed using the standard protocol during bolus administration of intravenous contrast. CONTRAST:  OMNIPAQUE IOHEXOL 350 MG/ML SOLN COMPARISON:  CT abdomen and pelvis 08/27/2019 FINDINGS: CTA CHEST FINDINGS Cardiovascular: Suboptimal contrast bolus limits examination of the pulmonary arteries. However, there is moderately good opacification of the central and proximal segmental pulmonary arteries. No definite filling defects are identified suggesting no definite evidence of significant pulmonary embolus. Normal caliber thoracic aorta. Normal heart size. No pericardial effusions. Mediastinum/Nodes: No enlarged mediastinal, hilar, or axillary lymph nodes. Thyroid gland, trachea, and esophagus demonstrate no significant findings. Lungs/Pleura: Diffuse airspace infiltration throughout both lungs with perihilar distribution and sparing the periphery. Changes likely to represent edema. Mild emphysematous changes in the apices. No pleural effusions. Musculoskeletal: Pectus excavatum deformity. No destructive bone lesions. Review of the MIP images confirms the above findings. CT ABDOMEN and PELVIS FINDINGS Hepatobiliary: Moderate hepatic enlargement without focal lesion. Small amount of upper abdominal and pelvic ascites. Gallbladder and bile ducts are  unremarkable. Pancreas: Unremarkable. No pancreatic ductal dilatation or surrounding inflammatory changes. Spleen: Spleen is enlarged.  No focal lesions. Adrenals/Urinary Tract: Adrenal glands are unremarkable. Kidneys are normal, without renal calculi, focal lesion, or hydronephrosis. Bladder is unremarkable. Stomach/Bowel: Stomach is within normal limits. Appendix  appears normal. No evidence of bowel wall thickening, distention, or inflammatory changes. Vascular/Lymphatic: Aortic atherosclerosis. No enlarged abdominal or pelvic lymph nodes. Reproductive: Prostate is unremarkable. Other: Mesenteric and retroperitoneal edema. No free air. Abdominal wall musculature appears intact. Musculoskeletal: No acute or significant osseous findings. Review of the MIP images confirms the above findings. IMPRESSION: 1. Suboptimal contrast bolus limits examination of the pulmonary arteries. No definite evidence of significant pulmonary embolus. 2. Diffuse airspace infiltration throughout both lungs with perihilar distribution and sparing the periphery. Changes likely to represent edema. Diffuse pneumonia possible but less likely. 3. Hepatosplenomegaly.  Similar to previous study. 4. Small amount of upper abdominal and pelvic ascites. 5. Mesenteric and retroperitoneal edema. 6. Pectus excavatum deformity. 7. Emphysema and aortic atherosclerosis. Aortic Atherosclerosis (ICD10-I70.0) and Emphysema (ICD10-J43.9). Electronically Signed   By: Burman Nieves M.D.   On: 10/11/2019 03:37   CT ABDOMEN PELVIS W CONTRAST  Result Date: 10/11/2019 CLINICAL DATA:  High probability of pulmonary embolus. Abdominal pain and fever. Shortness of breath. EXAM: CT ANGIOGRAPHY CHEST CT ABDOMEN AND PELVIS WITH CONTRAST TECHNIQUE: Multidetector CT imaging of the chest was performed using the standard protocol during bolus administration of intravenous contrast. Multiplanar CT image reconstructions and MIPs were obtained to evaluate the vascular  anatomy. Multidetector CT imaging of the abdomen and pelvis was performed using the standard protocol during bolus administration of intravenous contrast. CONTRAST:  OMNIPAQUE IOHEXOL 350 MG/ML SOLN COMPARISON:  CT abdomen and pelvis 08/27/2019 FINDINGS: CTA CHEST FINDINGS Cardiovascular: Suboptimal contrast bolus limits examination of the pulmonary arteries. However, there is moderately good opacification of the central and proximal segmental pulmonary arteries. No definite filling defects are identified suggesting no definite evidence of significant pulmonary embolus. Normal caliber thoracic aorta. Normal heart size. No pericardial effusions. Mediastinum/Nodes: No enlarged mediastinal, hilar, or axillary lymph nodes. Thyroid gland, trachea, and esophagus demonstrate no significant findings. Lungs/Pleura: Diffuse airspace infiltration throughout both lungs with perihilar distribution and sparing the periphery. Changes likely to represent edema. Mild emphysematous changes in the apices. No pleural effusions. Musculoskeletal: Pectus excavatum deformity. No destructive bone lesions. Review of the MIP images confirms the above findings. CT ABDOMEN and PELVIS FINDINGS Hepatobiliary: Moderate hepatic enlargement without focal lesion. Small amount of upper abdominal and pelvic ascites. Gallbladder and bile ducts are unremarkable. Pancreas: Unremarkable. No pancreatic ductal dilatation or surrounding inflammatory changes. Spleen: Spleen is enlarged.  No focal lesions. Adrenals/Urinary Tract: Adrenal glands are unremarkable. Kidneys are normal, without renal calculi, focal lesion, or hydronephrosis. Bladder is unremarkable. Stomach/Bowel: Stomach is within normal limits. Appendix appears normal. No evidence of bowel wall thickening, distention, or inflammatory changes. Vascular/Lymphatic: Aortic atherosclerosis. No enlarged abdominal or pelvic lymph nodes. Reproductive: Prostate is unremarkable. Other: Mesenteric and  retroperitoneal edema. No free air. Abdominal wall musculature appears intact. Musculoskeletal: No acute or significant osseous findings. Review of the MIP images confirms the above findings. IMPRESSION: 1. Suboptimal contrast bolus limits examination of the pulmonary arteries. No definite evidence of significant pulmonary embolus. 2. Diffuse airspace infiltration throughout both lungs with perihilar distribution and sparing the periphery. Changes likely to represent edema. Diffuse pneumonia possible but less likely. 3. Hepatosplenomegaly.  Similar to previous study. 4. Small amount of upper abdominal and pelvic ascites. 5. Mesenteric and retroperitoneal edema. 6. Pectus excavatum deformity. 7. Emphysema and aortic atherosclerosis. Aortic Atherosclerosis (ICD10-I70.0) and Emphysema (ICD10-J43.9). Electronically Signed   By: Burman Nieves M.D.   On: 10/11/2019 03:37   DG Chest Port 1 View  Result Date: 10/11/2019 CLINICAL DATA:  Shortness of breath fever EXAM: PORTABLE CHEST 1 VIEW COMPARISON:  08/27/2019 FINDINGS: Low lung volumes. Mild diffuse ground-glass opacity most prominent at the lung bases. Normal heart size. No pneumothorax. IMPRESSION: Low lung volumes with diffuse ground-glass opacity concerning for bilateral pneumonia, possible atypical or viral pneumonia. Electronically Signed   By: Jasmine Pang M.D.   On: 10/11/2019 02:16    Procedures Procedures (including critical care time)  CRITICAL CARE Performed by: Richardean Canal   Total critical care time: 30 minutes  Critical care time was exclusive of separately billable procedures and treating other patients.  Critical care was necessary to treat or prevent imminent or life-threatening deterioration.  Critical care was time spent personally by me on the following activities: development of treatment plan with patient and/or surrogate as well as nursing, discussions with consultants, evaluation of patient's response to treatment,  examination of patient, obtaining history from patient or surrogate, ordering and performing treatments and interventions, ordering and review of laboratory studies, ordering and review of radiographic studies, pulse oximetry and re-evaluation of patient's condition.    Medications Ordered in ED Medications  vancomycin (VANCOCIN) IVPB 1000 mg/200 mL premix (1,000 mg Intravenous New Bag/Given 10/11/19 0343)  sodium chloride 0.9 % bolus 1,000 mL (0 mLs Intravenous Stopped 10/11/19 0302)  ibuprofen (ADVIL) tablet 800 mg (800 mg Oral Given 10/11/19 0159)  ondansetron (ZOFRAN) injection 4 mg (4 mg Intravenous Given 10/11/19 0200)  LORazepam (ATIVAN) injection 1 mg (1 mg Intravenous Given 10/11/19 0215)  ceFEPIme (MAXIPIME) 2 g in sodium chloride 0.9 % 100 mL IVPB (0 g Intravenous Stopped 10/11/19 0333)  iohexol (OMNIPAQUE) 350 MG/ML injection 100 mL (100 mLs Intravenous Contrast Given 10/11/19 0328)    ED Course  I have reviewed the triage vital signs and the nursing notes.  Pertinent labs & imaging results that were available during my care of the patient were reviewed by me and considered in my medical decision making (see chart for details).    MDM Rules/Calculators/A&P                          Thomas Atkins is a 44 y.o. male who presented with abdominal pain and fever and chills and cough.  Patient had fever 102 at home.  In the ED, patient's temperature is 99.  Patient is tachycardic.  Consider pneumonia versus Covid versus SBP.  Plan to get CBC, CMP, lactate, cultures, chest x-ray, CT abdomen pelvis. Will hydrate and reassess.   4 AM Patient's white blood cell count is 15, his lactate is 2.2.  CT showed diffuse edema versus pneumonia.  Patient has minimal ascites.  COVID negative. Given broad spectrum abx. Patient starting desatting to 87% on 4L [place]. Placed patient on bipap. Will get ABG  5 AM ABG showed pH 7.38. CO2 35. PO2 161 on bipap. Patient's O2 is 99% on bipap now. Will admit to stepdown for  pneumonia with hypoxia.   Final Clinical Impression(s) / ED Diagnoses Final diagnoses:  None    Rx / DC Orders ED Discharge Orders    None       Charlynne Pander, MD 10/11/19 [phone]

## 2019-10-11 NOTE — ED Notes (Signed)
Pt transported to CTA via stretcher 

## 2019-10-11 NOTE — Progress Notes (Signed)
  Echocardiogram 2D Echocardiogram has been performed.  Thomas Atkins 10/11/2019, 1:07 PM

## 2019-10-11 NOTE — Progress Notes (Signed)
PROGRESS NOTE    Patient: Thomas Atkins                            PCP: Ladell Pier, MD                    DOB: Jul 06, 1975            DOA: 10/11/2019 PJS:[id number]             DOS: 10/11/2019, 7:08 AM   LOS: 0 days   Date of Service: The patient was seen and examined on 10/11/2019  Subjective:   The patient was seen and examined this Am. Remains in respiratory distress, shortness of breath, On BiPAP >>> briefly has been taken off BiPAP, placed on 8 L of oxygen, satting 92%, able to communicate... Complaining shortness of breath but denies any chest pain   Brief Narrative:   Per HPI ; Mr. Zoey Gilkeson is a 44 year old male with past medical history of alcohol abuse, alcoholic cirrhosis with ascites, with multiple paracentesis last one 2 months ago, nicotine dependence, hypertension, depression, HIV (last CD4 1243, last viral load 166) gastroesophageal reflux disease who presents to ED department complaints of shortness of breath and fever.  Patient recently traveled to [place] just returned yesterday.Marland Kitchen He went to bed with shortness of breath, woke up shaking, T-max 102.6, Patient was found hypoxic in the ED, was placed on BiPAP, CT angiogram of the chest was performed revealed no pulmonary embolism found to have a bilateral diffuse infiltrate. Cultures were obtained, patient started on broad-spectrum antibiotic of cefepime and vancomycin... As he met the sepsis criteria  Assessment & Plan:   Active Problems:   Acute respiratory failure with hypoxia (HCC)   Sepsis -due to pneumonia (POA) -Meets sepsis criteria for acute hypoxic, respiratory distress, tachypnea, febrile, leukocytosis, -Sepsis protocol initiated in ED IV fluid resuscitation limited as patient has a significant liver cirrhosis, with ascites -Status post IV fluid resuscitation -Broad-spectrum antibiotics initiated on vancomycin and cefepime will be continued -Blood cultures/sputum cultures >> will be followed  accordingly -We will continue to monitor on stepdown unit -Note: Patient's HIV status, CD4 count 1243 -low suspicious of opportunistic infection -SARS-CoV-2 negative -CT angiogram was reviewed, suboptimal but negative for pulmonary embolism, positive for infiltrate bilaterally -Lactic acid 2.1 >>> 1.2  Acute respiratory failure -due to pneumonia, sepsis -CT angiogram was reviewed once again soft abdomen, but negative for PE, diffuse infiltrate noted--- -concern for possible developing ARDS, pulmonary critical care team consulted, case was discussed in detail -We will treat underlying possibility of pneumonia,  -We will initiate diuretics diuretics -Likely due to underlying infection -On BiPAP satting 96%--plan to wean off BiPAP  - ABG7.38/CO2 35.5/O2 sat 161, HCO3 21.1   Pneumonia / lactic acidosis Lactic acidosis secondary to hypoxia, respiratory failure, underlying pneumonia -We will follow with blood cultures, continue current antibiotics of Rocephin -Lactic acid on admission 2.2, 1.2 now   Abdominal pain -Mild painful distention, CT of abdomen revealed  Ascites-mesenteric and retroperitoneal edema was noted -We will monitor closely, as needed analgesics   History of alcohol abuse -Admits that he continues to drink -We will provide alcohol withdrawal prevention with CIWA protocol -IV banana bag overnight and will provide oral folate and thiamine in the morning.  HIV -(last CD4 1243, last viral load 166)  -We will continue home HIV medication.   Appears well controlled  Tobacco abuse -Providing nicotine patch.  Cirrhosis  with ascites -Patient without clear ascites on physical exam. -CT abdomen pelvis was obtained: Revealing hepatomegaly, liver cirrhosis, minimal ascites, Mesenteric and retroperitoneal edema -No concern for SBP, nevertheless on broad-spectrum antibiotic as above  Chronic hyponatremia -Likely secondary to underlying cirrhosis -Monitoring  closely   GERD without esophagitis -Continue PPI  BPH without obstruction -No lower urinary tract symptoms -Continue Flomax      Nutritional status:          Cultures; Blood Cultures x 2 >> Pending sputum culture  Antimicrobials: -10/11/2019 IV cefepime/vancomycin/azithromycin    Consultants: None   -----------------------------------------------------------------------------------------------------------------------------------  DVT prophylaxis:  SCD/Compression stockings and Lovenox SQ Code Status:   Code Status: Prior Family Communication:  No family member present at bedside-is currently trying to call his mother on his cell phone for update The above findings and plan of care has been discussed with patient   in detail,  they expressed understanding and agreement of above. -Advance care planning has been discussed.   Admission status:    Status is: Inpatient  Remains inpatient appropriate because:Inpatient level of care appropriate due to severity of illness   Dispo: The patient is from: Home              Anticipated d/c is to: Home              Anticipated d/c date is: 2 days              Patient currently is not medically stable to d/c.    CT abdomen/pelvis IMPRESSION: 1. Suboptimal contrast bolus limits examination of the pulmonary arteries. No definite evidence of significant pulmonary embolus. 2. Diffuse airspace infiltration throughout both lungs with perihilar distribution and sparing the periphery. Changes likely to represent edema. Diffuse pneumonia possible but less likely. 3. Hepatosplenomegaly.  Similar to previous study. 4. Small amount of upper abdominal and pelvic ascites. 5. Mesenteric and retroperitoneal edema. 6. Pectus excavatum deformity. 7. Emphysema and aortic atherosclerosis.    Procedures:   No admission procedures for hospital encounter.     Antimicrobials:  Anti-infectives (From admission, onward)   Start      Dose/Rate Route Frequency Ordered Stop   10/11/19 0230  vancomycin (VANCOCIN) IVPB 1000 mg/200 mL premix        1,000 mg 200 mL/hr over 60 Minutes Intravenous  Once 10/11/19 0220 10/11/19 0444   10/11/19 0230  ceFEPIme (MAXIPIME) 2 g in sodium chloride 0.9 % 100 mL IVPB        2 g 200 mL/hr over 30 Minutes Intravenous  Once 10/11/19 0220 10/11/19 0333       Medication:       Objective:   Vitals:   10/11/19 0345 10/11/19 0418 10/11/19 0430 10/11/19 0638  BP: 102/79  110/73 94/71  Pulse: (!) 110  (!) 104 79  Resp: (!) 40  (!) 32 (!) 27  Temp:      TempSrc:      SpO2: (!) 87% 97% 97% 96%  Weight:      Height:        Intake/Output Summary (Last 24 hours) at 10/11/2019 0708 Last data filed at 10/11/2019 0444 Gross per 24 hour  Intake 1300 ml  Output --  Net 1300 ml   Filed Weights   10/11/19 0121  Weight: 92 kg     Examination:   Physical Exam  Constitution: Awake alert, in respiratory distress, on BiPAP, briefly taken off for interview and examination  Psychiatric: Normal and stable mood and affect,  cognition intact,   HEENT: Normocephalic, PERRL, otherwise with in Normal limits  Chest:Chest symmetric Cardio vascular:   Tachycardic, S1/S2, RRR, No murmure, No Rubs or Gallops  pulmonary: Patient chest, diffuse wheezing, labored respiratory efforts, diffuse wheezing, with minimal rhonchi and crackles at the lower lobes  Abdomen: Soft, non-tender, distended, mild fluid shift, hepatomegaly, bowel sounds Muscular skeletal: Limited exam - in bed, able to move all 4 extremities, Normal strength,  Neuro: CNII-XII intact. , normal motor and sensation, reflexes intact  Extremities: No pitting edema lower extremities, +2 pulses  Skin: Dry, warm to touch, negative for any Rashes, No open wounds Wounds: per nursing documentation    ------------------------------------------------------------------------------------------------------------------------------------------     LABs:  CBC Latest Ref Rng & Units 10/11/2019 08/28/2019 08/27/2019  WBC 4.0 - 10.5 K/uL 15.2(H) 11.7(H) 12.4(H)  Hemoglobin 13.0 - 17.0 g/dL 12.5(L) 12.2(L) 12.2(L)  Hematocrit 39 - 52 % 37.6(L) 36.5(L) 36.0(L)  Platelets 150 - 400 K/uL 192 194 204   CMP Latest Ref Rng & Units 10/11/2019 08/28/2019 08/27/2019  Glucose 70 - 99 mg/dL 114(H) 87 -  BUN 6 - 20 mg/dL 5(L) 5(L) -  Creatinine 0.61 - 1.24 mg/dL 0.82 0.77 0.72  Sodium 135 - 145 mmol/L 136 132(L) -  Potassium 3.5 - 5.1 mmol/L 3.3(L) 3.5 -  Chloride 98 - 111 mmol/L 107 98 -  CO2 22 - 32 mmol/L 16(L) 23 -  Calcium 8.9 - 10.3 mg/dL 8.9 8.1(L) -  Total Protein 6.5 - 8.1 g/dL 8.3(H) 7.0 -  Total Bilirubin 0.3 - 1.2 mg/dL 2.5(H) 2.0(H) -  Alkaline Phos 38 - 126 U/L 148(H) 199(H) -  AST 15 - 41 U/L 46(H) 65(H) -  ALT 0 - 44 U/L 23 36 -       Micro Results Recent Results (from the past 240 hour(s))  SARS Coronavirus 2 by RT PCR (hospital order, performed in [facility] hospital lab) Nasopharyngeal Nasopharyngeal Swab     Status: None   Collection Time: 10/11/19  2:03 AM   Specimen: Nasopharyngeal Swab  Result Value Ref Range Status   SARS Coronavirus 2 NEGATIVE NEGATIVE Final    Comment: (NOTE) SARS-CoV-2 target nucleic acids are NOT DETECTED.  The SARS-CoV-2 RNA is generally detectable in upper and lower respiratory specimens during the acute phase of infection. The lowest concentration of SARS-CoV-2 viral copies this assay can detect is 250 copies / mL. A negative result does not preclude SARS-CoV-2 infection and should not be used as the sole basis for treatment or other patient management decisions.  A negative result may occur with improper specimen collection / handling, submission of specimen other than nasopharyngeal swab, presence of viral mutation(s) within the areas targeted by this assay, and inadequate number of viral copies (<250 copies / mL). A negative result must be combined with clinical observations, patient  history, and epidemiological information.  Fact Sheet for Patients:   StrictlyIdeas.no  Fact Sheet for Healthcare Providers: BankingDealers.co.za  This test is not yet approved or  cleared by the [place] FDA and has been authorized for detection and/or diagnosis of SARS-CoV-2 by FDA under an Emergency Use Authorization (EUA).  This EUA will remain in effect (meaning this test can be used) for the duration of the COVID-19 declaration under Section 564(b)(1) of the Act, 21 U.S.C. section 360bbb-3(b)(1), unless the authorization is terminated or revoked sooner.  Performed at [facility], [place] [address], [place], [place] [address]     Radiology Reports CT Angio Chest PE W  and/or Wo Contrast  Result Date: 10/11/2019 CLINICAL DATA:  High probability of pulmonary embolus. Abdominal pain and fever. Shortness of breath. EXAM: CT ANGIOGRAPHY CHEST CT ABDOMEN AND PELVIS WITH CONTRAST TECHNIQUE: Multidetector CT imaging of the chest was performed using the standard protocol during bolus administration of intravenous contrast. Multiplanar CT image reconstructions and MIPs were obtained to evaluate the vascular anatomy. Multidetector CT imaging of the abdomen and pelvis was performed using the standard protocol during bolus administration of intravenous contrast. CONTRAST:  170m OMNIPAQUE IOHEXOL 350 MG/ML SOLN COMPARISON:  CT abdomen and pelvis 08/27/2019 FINDINGS: CTA CHEST FINDINGS Cardiovascular: Suboptimal contrast bolus limits examination of the pulmonary arteries. However, there is moderately good opacification of the central and proximal segmental pulmonary arteries. No definite filling defects are identified suggesting no definite evidence of significant pulmonary embolus. Normal caliber thoracic aorta. Normal heart size. No pericardial effusions. Mediastinum/Nodes: No enlarged mediastinal, hilar, or axillary lymph nodes. Thyroid gland,  trachea, and esophagus demonstrate no significant findings. Lungs/Pleura: Diffuse airspace infiltration throughout both lungs with perihilar distribution and sparing the periphery. Changes likely to represent edema. Mild emphysematous changes in the apices. No pleural effusions. Musculoskeletal: Pectus excavatum deformity. No destructive bone lesions. Review of the MIP images confirms the above findings. CT ABDOMEN and PELVIS FINDINGS Hepatobiliary: Moderate hepatic enlargement without focal lesion. Small amount of upper abdominal and pelvic ascites. Gallbladder and bile ducts are unremarkable. Pancreas: Unremarkable. No pancreatic ductal dilatation or surrounding inflammatory changes. Spleen: Spleen is enlarged.  No focal lesions. Adrenals/Urinary Tract: Adrenal glands are unremarkable. Kidneys are normal, without renal calculi, focal lesion, or hydronephrosis. Bladder is unremarkable. Stomach/Bowel: Stomach is within normal limits. Appendix appears normal. No evidence of bowel wall thickening, distention, or inflammatory changes. Vascular/Lymphatic: Aortic atherosclerosis. No enlarged abdominal or pelvic lymph nodes. Reproductive: Prostate is unremarkable. Other: Mesenteric and retroperitoneal edema. No free air. Abdominal wall musculature appears intact. Musculoskeletal: No acute or significant osseous findings. Review of the MIP images confirms the above findings. IMPRESSION: 1. Suboptimal contrast bolus limits examination of the pulmonary arteries. No definite evidence of significant pulmonary embolus. 2. Diffuse airspace infiltration throughout both lungs with perihilar distribution and sparing the periphery. Changes likely to represent edema. Diffuse pneumonia possible but less likely. 3. Hepatosplenomegaly.  Similar to previous study. 4. Small amount of upper abdominal and pelvic ascites. 5. Mesenteric and retroperitoneal edema. 6. Pectus excavatum deformity. 7. Emphysema and aortic atherosclerosis. Aortic  Atherosclerosis (ICD10-I70.0) and Emphysema (ICD10-J43.9). Electronically Signed   By: WLucienne CapersM.D.   On: 10/11/2019 03:37   CT ABDOMEN PELVIS W CONTRAST  Result Date: 10/11/2019 CLINICAL DATA:  High probability of pulmonary embolus. Abdominal pain and fever. Shortness of breath. EXAM: CT ANGIOGRAPHY CHEST CT ABDOMEN AND PELVIS WITH CONTRAST TECHNIQUE: Multidetector CT imaging of the chest was performed using the standard protocol during bolus administration of intravenous contrast. Multiplanar CT image reconstructions and MIPs were obtained to evaluate the vascular anatomy. Multidetector CT imaging of the abdomen and pelvis was performed using the standard protocol during bolus administration of intravenous contrast. CONTRAST:  1020mOMNIPAQUE IOHEXOL 350 MG/ML SOLN COMPARISON:  CT abdomen and pelvis 08/27/2019 FINDINGS: CTA CHEST FINDINGS Cardiovascular: Suboptimal contrast bolus limits examination of the pulmonary arteries. However, there is moderately good opacification of the central and proximal segmental pulmonary arteries. No definite filling defects are identified suggesting no definite evidence of significant pulmonary embolus. Normal caliber thoracic aorta. Normal heart size. No pericardial effusions. Mediastinum/Nodes: No enlarged mediastinal, hilar, or axillary lymph nodes. Thyroid gland,  trachea, and esophagus demonstrate no significant findings. Lungs/Pleura: Diffuse airspace infiltration throughout both lungs with perihilar distribution and sparing the periphery. Changes likely to represent edema. Mild emphysematous changes in the apices. No pleural effusions. Musculoskeletal: Pectus excavatum deformity. No destructive bone lesions. Review of the MIP images confirms the above findings. CT ABDOMEN and PELVIS FINDINGS Hepatobiliary: Moderate hepatic enlargement without focal lesion. Small amount of upper abdominal and pelvic ascites. Gallbladder and bile ducts are unremarkable. Pancreas:  Unremarkable. No pancreatic ductal dilatation or surrounding inflammatory changes. Spleen: Spleen is enlarged.  No focal lesions. Adrenals/Urinary Tract: Adrenal glands are unremarkable. Kidneys are normal, without renal calculi, focal lesion, or hydronephrosis. Bladder is unremarkable. Stomach/Bowel: Stomach is within normal limits. Appendix appears normal. No evidence of bowel wall thickening, distention, or inflammatory changes. Vascular/Lymphatic: Aortic atherosclerosis. No enlarged abdominal or pelvic lymph nodes. Reproductive: Prostate is unremarkable. Other: Mesenteric and retroperitoneal edema. No free air. Abdominal wall musculature appears intact. Musculoskeletal: No acute or significant osseous findings. Review of the MIP images confirms the above findings. IMPRESSION: 1. Suboptimal contrast bolus limits examination of the pulmonary arteries. No definite evidence of significant pulmonary embolus. 2. Diffuse airspace infiltration throughout both lungs with perihilar distribution and sparing the periphery. Changes likely to represent edema. Diffuse pneumonia possible but less likely. 3. Hepatosplenomegaly.  Similar to previous study. 4. Small amount of upper abdominal and pelvic ascites. 5. Mesenteric and retroperitoneal edema. 6. Pectus excavatum deformity. 7. Emphysema and aortic atherosclerosis. Aortic Atherosclerosis (ICD10-I70.0) and Emphysema (ICD10-J43.9). Electronically Signed   By: Lucienne Capers M.D.   On: 10/11/2019 03:37   DG Chest Port 1 View  Result Date: 10/11/2019 CLINICAL DATA:  Shortness of breath fever EXAM: PORTABLE CHEST 1 VIEW COMPARISON:  08/27/2019 FINDINGS: Low lung volumes. Mild diffuse ground-glass opacity most prominent at the lung bases. Normal heart size. No pneumothorax. IMPRESSION: Low lung volumes with diffuse ground-glass opacity concerning for bilateral pneumonia, possible atypical or viral pneumonia. Electronically Signed   By: Donavan Foil M.D.   On: 10/11/2019  02:16   IR ABDOMEN US LIMITED  Result Date: 09/28/2019 CLINICAL DATA:  History of alcoholic cirrhosis with recurrent ascites. Please perform ascites search ultrasound ultrasound-guided paracentesis for therapeutic purposes as indicated. EXAM: LIMITED ABDOMEN ULTRASOUND FOR ASCITES TECHNIQUE: Limited ultrasound survey for ascites was performed in all four abdominal quadrants. COMPARISON:  Multiple previous ultrasound-guided paracenteses, most recently on 08/24/2019 yielding 1.4 L of peritoneal fluid FINDINGS: Sonographic evaluation of the abdomen demonstrates a trace amount of perihepatic ascites, too small to allow for safe ultrasound-guided paracentesis. No paracentesis attempted. IMPRESSION: Trace amount of perihepatic ascites, too small to allow for safe ultrasound-guided paracentesis. Electronically Signed   By: Sandi Mariscal M.D.   On: 09/28/2019 10:46    SIGNED: Deatra James, MD, FACP, FHM. [facility],  Pager (please use [email or web address] to page/text)  If 7PM-7AM, please contact night-coverage [email or web address], Password [facility] 10/11/2019, 7:08 AM

## 2019-10-11 NOTE — Progress Notes (Signed)
RT NOTES: Patient removed from bipap per MD and placed on 8L HFNC. Sats 97%. Will continue to monitor.

## 2019-10-11 NOTE — ED Notes (Signed)
RT contacted to aid with O2 administration

## 2019-10-11 NOTE — ED Triage Notes (Signed)
Patient reports fever,chills, SOB,productive cough, emesis and headache this evening , drank ETOH today , history of liver cirrhosis with abdominal distention .

## 2019-10-11 NOTE — Progress Notes (Signed)
ABG obtained results given to MD but results not crossing over pH 7.38 CO2 35.5 O2 161 HCO3 21.1 99%

## 2019-10-11 NOTE — Consult Note (Signed)
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  Volume overloaded.  Currently is hemodynamically stable sats are 96%.  History of present illness   44 year old male who has an extensive past medical history that includes cirrhosis and liver failure with multiple paracentesis last being done in June 2021.  He also has ongoing alcohol tobacco and polysubstance abuse.  Recently in March 2021 diagnosed with HIV.  He reports 24 hours of constipation coupled with abdominal pain nausea and vomiting then increasing shortness of breath.\ He has had multiple paracentesis in the past.  He presented to [facility] felt to be septic procalcitonin noted to be 1.22, lactic acid 1.2 and was started on the sepsis protocol.  Chest CT and x-ray gave the appearance of being volume overloaded and increasing shortness of breath and hypoxia was noted.  Fluid resuscitation was stopped and currently he is on 8 L nasal cannula sats 96% able to carry on conversations without difficulty able to sit on the side of the bed without difficulty O2 saturations are 96% on 8 L nasal cannula therefore O2 can be decreased intermittently on noninvasive mechanical ventilatory support but does not require that at this time.  Pulmonary critical care asked to evaluate agree with admission to stepdown unit pulmonary critical care will follow as needed.  Past Medical History   HIV positive  liver failure with cirrhosis Hypertension Chronic alcohol drug abuse Cirrhosis requiring paracentesis anywhere from 5 to 1 L intermittent basis  Significant Hospital Events   Admitted to the  hospital shortness of breath, abdominal pain, nausea vomiting.  Consults:  10/11/2019 pulmonary critical care Procedures:    Significant Diagnostic Tests:  CT of the abdomen and chest is noted  Micro Data:  10/11/2019 blood cultures x2 8 04/13/2019 urine culture  Antimicrobials:  10/11/2019 vancomycin 10/11/2019 cefepime 10/11/2019 Zithromax Interim history/subjective:  44 year old male with 24 hours of nausea vomiting shortness of breath in the setting of ongoing alcohol tobacco abuse.  Reports having a fever 102.6  Objective   Blood pressure 103/71, pulse 75, temperature 99.2 F (37.3 C), temperature source Oral, resp. rate (!) 21, height 6\' 1"  (1.854 m), weight 92 kg, SpO2 95 %.        Intake/Output Summary (Last 24 hours) at 10/11/2019 1206 Last data filed at 10/11/2019 0444 Gross per 24 hour  Intake 1300 ml  Output --  Net 1300 ml   Filed Weights   10/11/19 0121  Weight: 92 kg    Examination: General: Well-nourished well-developed male who is able to carry on conversations without any apparent shortness of breath HENT: No JVD or lymphadenopathy is appreciated missing tooth noted Lungs: Coarse rhonchi bilaterally bibasilar crackles Cardiovascular: Heart sounds are regular regular rate and rhythm Abdomen: Exquisitely tender mild distention positive bowel sounds Extremities: Warm and dry without edema Neuro: Grossly intact without focal defect GU: Voids  Resolved Hospital Problem list     Assessment & Plan:  Hypoxia most likely multifactorial in origin with bilateral airspace disease which could be pneumonia versus edema or both along with ongoing tobacco abuse. Wean oxygen as tolerated Noninvasive mechanical ventilatory support as needed Agree with stopping fluid hydration When able resume  diuresis Bronchodilators as needed Serial chest x-rays ABGs in the future if needed  Chronic liver failure in the setting of continued alcohol abuse with cirrhosis requiring  intermittent paracentesis last done in June 2021.  He is followed by Dr. Willis Modena of GI services.  Note he was suffering from constipation along with nausea and vomiting over 24-hour period. Consider GI consult After examining CT the abdomen no significant fluid for paracentesis Abdomen is exquisitely tender there may be an infectious process in the abdomen Agree with antimicrobial therapy Only need for abdominal tap may be for culture data.  Presumed sepsis from either abdominal or pulmonary source or both. Agree with empirical antimicrobial therapy Agree with admission to stepdown unit Continue to follow all culture data  Ongoing tobacco abuse Agree with NicoDerm patch at lower dose Counseling  Alcohol abuse with the consumption of anywhere from 4-24 beers daily Consider counseling  Reported polysubstance abuse Check UDS  HIV positive diagnosed March 2021 Infectious disease consult if not already done  Best practice:  Diet: NPO Pain/Anxiety/Delirium protocol (if indicated): As ordered VAP protocol (if indicated): In place DVT prophylaxis: Low molecular weight heparin GI prophylaxis: PPI Glucose control: Sliding scale insulin protocol Mobility: Bedrest Code Status: Full Family Communication: Per primary team Disposition: Agree with admission to stepdown unit  Labs   CBC: Recent Labs  Lab 10/11/19 0136 10/11/19 0812  WBC 15.2* 15.8*  NEUTROABS 13.2* 13.1*  HGB 12.5* 12.1*  HCT 37.6* 37.3*  MCV 97.4 98.9  PLT 192 164    Basic Metabolic Panel: Recent Labs  Lab 10/11/19 0136 10/11/19 0812  NA 136 137  K 3.3* 4.0  CL 107 107  CO2 16* 21*  GLUCOSE 114* 110*  BUN 5* 7  CREATININE 0.82 0.92  CALCIUM 8.9 8.8*   GFR: Estimated Creatinine Clearance: 113.4 mL/min (by C-G formula based on SCr of 0.92 mg/dL). Recent Labs  Lab 10/11/19 0136 10/11/19 0327 10/11/19 0812  PROCALCITON  --   --  1.22  WBC 15.2*  --  15.8*  LATICACIDVEN 2.2* 1.2  --      Liver Function Tests: Recent Labs  Lab 10/11/19 0136 10/11/19 0812  AST 46* 44*  ALT 23 21  ALKPHOS 148* 131*  BILITOT 2.5* 2.9*  PROT 8.3* 7.7  ALBUMIN 3.5 3.2*   Recent Labs  Lab 10/11/19 0142  LIPASE 31   No results for input(s): AMMONIA in the last 168 hours.  ABG    Component Value Date/Time   PHART 7.396 02/07/2012 2130   PCO2ART 37.9 02/07/2012 2130   PO2ART 92.5 02/07/2012 2130   HCO3 22.8 02/07/2012 2130   TCO2 19.5 02/07/2012 2130   ACIDBASEDEF 1.2 02/07/2012 2130   O2SAT 96.4 02/07/2012 2130     Coagulation Profile: Recent Labs  Lab 10/11/19 0812  INR 1.5*    Cardiac Enzymes: No results for input(s): CKTOTAL, CKMB, CKMBINDEX, TROPONINI in the last 168 hours.  HbA1C: Hgb A1c MFr Bld  Date/Time Value Ref Range Status  05/26/2019 07:21 AM 5.9 (H) 4.8 - 5.6 % Final    Comment:    (NOTE) Pre diabetes:          5.7%-6.4% Diabetes:              >6.4% Glycemic control for   <7.0% adults with diabetes     CBG: Recent Labs  Lab 10/11/19 0758  GLUCAP 106*  106*    Review of Systems:   10 point review of system taken, please  see HPI for positives and negatives.   Past Medical History  He,  has a past medical history of Alcoholism (HCC), Anxiety, Depression, HIV infection (HCC), Seizure disorder (HCC), and Syncope.   Surgical History    Past Surgical History:  Procedure Laterality Date  . IR PARACENTESIS  07/01/2019  . IR PARACENTESIS  07/15/2019  . IR PARACENTESIS  07/27/2019  . IR PARACENTESIS  08/03/2019  . IR PARACENTESIS  08/10/2019  . IR PARACENTESIS  08/24/2019  . SURGERY SCROTAL / TESTICULAR       Social History   reports that he has been smoking cigarettes. He has a 30.00 pack-year smoking history. He has never used smokeless tobacco. He reports current alcohol use of about 24.0 standard drinks of alcohol per week. He reports current drug use. Drug: Marijuana.   Family History   His family history includes Bipolar disorder in  his father; Heart disease in his paternal grandmother.   Allergies Allergies  Allergen Reactions  . Bee Venom Anaphylaxis  . Lactose Intolerance (Gi) Diarrhea, Nausea Only and Other (See Comments)    Abdominal pain    . Ultram [Tramadol Hcl] Hives          Home Medications  Prior to Admission medications   Medication Sig Start Date End Date Taking? Authorizing Provider  Aspirin-Salicylamide-Caffeine (BC HEADACHE POWDER PO) Take 1 packet by mouth 2 (two) times daily as needed (pain/headache).   Yes [provider]  bictegravir-emtricitabine-tenofovir AF (BIKTARVY) 50-200-25 MG TABS tablet Take 1 tablet by mouth daily. 09/01/19  Yes Veryl Speak, FNP  Bismuth Subsalicylate (PEPTO-BISMOL PO) Take 1 tablet by mouth daily as needed (heartburn/indigestion).   Yes [provider]  furosemide (LASIX) 20 MG tablet Take 1 tablet (20 mg total) by mouth daily. Patient taking differently: Take 20 mg by mouth daily at 12 noon.  08/09/19  Yes [name], MD  Multiple Vitamin (MULTIVITAMIN WITH MINERALS) TABS tablet Take 1 tablet by mouth daily. 08/29/19  Yes Azucena Fallen, MD  pantoprazole (PROTONIX) 40 MG tablet Take 1 tablet (40 mg total) by mouth daily. 09/15/19 09/14/20 Yes [name], MD  Sennosides (EX-LAX PO) Take 1 tablet by mouth daily as needed (constipation).   Yes [provider]  spironolactone (ALDACTONE) 50 MG tablet Take 1 tablet (50 mg total) by mouth daily. Patient taking differently: Take 50 mg by mouth daily at 12 noon.  08/09/19  Yes [name], MD  tamsulosin (FLOMAX) 0.4 MG CAPS capsule Take 1 capsule (0.4 mg total) by mouth daily. Patient taking differently: Take 0.4 mg by mouth daily at 12 noon.  04/07/19  Yes [name], MD  traZODone (DESYREL) 100 MG tablet Take 1.5 tablets (150 mg total) by mouth at bedtime as needed for sleep. Patient taking differently: Take 200 mg by mouth at bedtime.  04/07/19  Yes [name], MD  thiamine 100 MG tablet Take 1 tablet (100 mg total) by mouth daily. Patient not taking: Reported on 10/11/2019 08/29/19   Azucena Fallen, MD     Critical care time:      Brett Canales Neymar Dowe ACNP Acute Care Nurse Practitioner [facility] Pul[facility]/Critical Care Please consult Amion 10/11/2019, 12:06 PM

## 2019-10-11 NOTE — H&P (Addendum)
History and Physical    WHITMAN MEINHARDT ZOX:[id number] DOB: 03-10-1976 DOA: 10/11/2019  PCP: [name], MD  Patient coming from:    Chief Complaint:  Chief Complaint  Patient presents with  . Fever/Emesis/Chills/SOB     HPI:    44 year old male with past medical history of alcohol abuse, alcoholic cirrhosis with ascites, nicotine dependence, hypertension, depression, HIV (last CD4 1243, last viral load 166) gastroesophageal reflux disease who presents to [facility] emergency department complaints of shortness of breath and fever.  Patient explains that he just recently traveled to [place] to visit a friend for the past several days.  Patient just returned from his trip yesterday evening.  Patient states that shortly after going to sleep, he woke up shaking.  Patient took his temperature which was 102.6 F.  He states that he suddenly began to experience shortness of breath as well.  Patient states that shortness of breath is severe in intensity, worse with minimal exertion and improved with rest.  Patient does complain of cough but states that this is no different than his usual chronic cough.  Patient denies any changes in appetite, muscle aches, generalized weakness, loss of taste or smell.  Due to patient's rapid onset and severity of symptoms, patient was brought into [facility] emergency department for evaluation.  Upon evaluation in the emergency department patient was found to be in respiratory distress, requiring initiation of supplemental oxygen and eventual placement on BiPAP.  CT angiogram of the chest was performed and revealed no evidence of pulmonary embolism despite being a suboptimal contrast bolus.  Patient was also found to have diffuse bilateral infiltrates.  Patient was initiated on intravenous cefepime and vancomycin.  The hospitalist group was then called to assess the patient for admission to the hospital.    Review of Systems:  Review of  Systems  Constitutional: Positive for chills and fever.  Respiratory: Positive for cough and shortness of breath.   Gastrointestinal: Positive for abdominal pain and vomiting.  Neurological: Positive for weakness.  All other systems reviewed and are negative.     Past Medical History:  Diagnosis Date  . Alcoholism (HCC)   . Anxiety   . Depression   . HIV infection (HCC)   . Seizure disorder (HCC)   . Syncope     Past Surgical History:  Procedure Laterality Date  . IR PARACENTESIS  07/01/2019  . IR PARACENTESIS  07/15/2019  . IR PARACENTESIS  07/27/2019  . IR PARACENTESIS  08/03/2019  . IR PARACENTESIS  08/10/2019  . IR PARACENTESIS  08/24/2019  . SURGERY SCROTAL / TESTICULAR       reports that he has been smoking cigarettes. He has a 30.00 pack-year smoking history. He has never used smokeless tobacco. He reports current alcohol use of about 24.0 standard drinks of alcohol per week. He reports current drug use. Drug: Marijuana.  Allergies  Allergen Reactions  . Bee Venom Anaphylaxis  . Lactose Intolerance (Gi) Diarrhea, Nausea Only and Other (See Comments)    Abdominal pain    . Ultram [Tramadol Hcl] Hives         Family History  Problem Relation Age of Onset  . Heart disease Paternal Grandmother   . Bipolar disorder Father      Prior to Admission medications   Medication Sig Start Date End Date Taking? Authorizing Provider  Aspirin-Salicylamide-Caffeine (BC HEADACHE POWDER PO) Take 1 packet by mouth 2 (two) times daily as needed (pain/headache).   Yes  [provider]  bictegravir-emtricitabine-tenofovir AF (BIKTARVY) 50-200-25 MG TABS tablet Take 1 tablet by mouth daily. 09/01/19  Yes Veryl Speak, FNP  Bismuth Subsalicylate (PEPTO-BISMOL PO) Take 1 tablet by mouth daily as needed (heartburn/indigestion).   Yes [provider]  furosemide (LASIX) 20 MG tablet Take 1 tablet (20 mg total) by mouth daily. Patient taking differently: Take 20 mg by  mouth daily at 12 noon.  08/09/19  Yes [name], MD  Multiple Vitamin (MULTIVITAMIN WITH MINERALS) TABS tablet Take 1 tablet by mouth daily. 08/29/19  Yes Azucena Fallen, MD  pantoprazole (PROTONIX) 40 MG tablet Take 1 tablet (40 mg total) by mouth daily. 09/15/19 09/14/20 Yes [name], MD  Sennosides (EX-LAX PO) Take 1 tablet by mouth daily as needed (constipation).   Yes [provider]  spironolactone (ALDACTONE) 50 MG tablet Take 1 tablet (50 mg total) by mouth daily. Patient taking differently: Take 50 mg by mouth daily at 12 noon.  08/09/19  Yes [name], MD  tamsulosin (FLOMAX) 0.4 MG CAPS capsule Take 1 capsule (0.4 mg total) by mouth daily. Patient taking differently: Take 0.4 mg by mouth daily at 12 noon.  04/07/19  Yes [name], MD  traZODone (DESYREL) 100 MG tablet Take 1.5 tablets (150 mg total) by mouth at bedtime as needed for sleep. Patient taking differently: Take 200 mg by mouth at bedtime.  04/07/19  Yes [name], MD  thiamine 100 MG tablet Take 1 tablet (100 mg total) by mouth daily. Patient not taking: Reported on 10/11/2019 08/29/19   Azucena Fallen, MD    Physical Exam: Vitals:   10/11/19 0700 10/11/19 0715 10/11/19 0720 10/11/19 0736  BP: 111/89 109/77    Pulse: 91 82  78  Resp: (!) 21 (!) 34  (!) 32  Temp:      TempSrc:      SpO2: 100% 99% 97% 90%  Weight:      Height:        Constitutional: Acute alert and oriented x3, patient is in notable respiratory distress. Skin: no rashes, no lesions, good skin turgor noted. Eyes: Pupils are equally reactive to light.  No evidence of scleral icterus or conjunctival pallor.  ENMT: Dry mucous membranes noted.  Posterior pharynx clear of any exudate or lesions.   Neck: normal, supple, no masses, no thyromegaly.  No evidence of jugular venous distension.   Respiratory: Coarse rales noted in all fields bilaterally with expiratory wheezing patient is tachypneic without  evidence of accessory muscle use.  Cardiovascular: Regular rate and rhythm, no murmurs / rubs / gallops. No extremity edema. 2+ pedal pulses. No carotid bruits.  Chest:   Nontender without crepitus or deformity.   Back:   Nontender without crepitus or deformity. Abdomen: Abdomen is somewhat protuberant but soft with generalized tenderness.  No evidence of intra-abdominal masses.  Positive bowel sounds noted in all quadrants.   Musculoskeletal: No joint deformity upper and lower extremities. Good ROM, no contractures. Normal muscle tone.  Neurologic: CN 2-12 grossly intact. Sensation intact, strength noted to be 5 out of 5 in all 4 extremities.  Patient is following all commands.  Patient is responsive to verbal stimuli.  Psychiatric: Patient presents as a normal mood with appropriate affect.  Patient seems to possess insight as to theircurrent situation.     Labs on Admission: I have personally reviewed following labs and imaging studies -   CBC: Recent Labs  Lab 10/11/19 0136  WBC 15.2*  NEUTROABS 13.2*  HGB 12.5*  HCT 37.6*  MCV 97.4  PLT 192   Basic Metabolic Panel: Recent Labs  Lab 10/11/19 0136  NA 136  K 3.3*  CL 107  CO2 16*  GLUCOSE 114*  BUN 5*  CREATININE 0.82  CALCIUM 8.9   GFR: Estimated Creatinine Clearance: 127.2 mL/min (by C-G formula based on SCr of 0.82 mg/dL). Liver Function Tests: Recent Labs  Lab 10/11/19 0136  AST 46*  ALT 23  ALKPHOS 148*  BILITOT 2.5*  PROT 8.3*  ALBUMIN 3.5   Recent Labs  Lab 10/11/19 0142  LIPASE 31   No results for input(s): AMMONIA in the last 168 hours. Coagulation Profile: No results for input(s): INR, PROTIME in the last 168 hours. Cardiac Enzymes: No results for input(s): CKTOTAL, CKMB, CKMBINDEX, TROPONINI in the last 168 hours. BNP (last 3 results) No results for input(s): PROBNP in the last 8760 hours. HbA1C: No results for input(s): HGBA1C in the last 72 hours. CBG: No results for input(s): GLUCAP in  the last 168 hours. Lipid Profile: No results for input(s): CHOL, HDL, LDLCALC, TRIG, CHOLHDL, LDLDIRECT in the last 72 hours. Thyroid Function Tests: No results for input(s): TSH, T4TOTAL, FREET4, T3FREE, THYROIDAB in the last 72 hours. Anemia Panel: No results for input(s): VITAMINB12, FOLATE, FERRITIN, TIBC, IRON, RETICCTPCT in the last 72 hours. Urine analysis:    Component Value Date/Time   COLORURINE AMBER (A) 10/11/2019 0145   APPEARANCEUR CLEAR 10/11/2019 0145   LABSPEC 1.017 10/11/2019 0145   PHURINE 5.0 10/11/2019 0145   GLUCOSEU NEGATIVE 10/11/2019 0145   HGBUR NEGATIVE 10/11/2019 0145   BILIRUBINUR NEGATIVE 10/11/2019 0145   KETONESUR NEGATIVE 10/11/2019 0145   PROTEINUR 30 (A) 10/11/2019 0145   UROBILINOGEN 0.2 02/19/2013 1819   NITRITE NEGATIVE 10/11/2019 0145   LEUKOCYTESUR NEGATIVE 10/11/2019 0145    Radiological Exams on Admission - Personally Reviewed: CT Angio Chest PE W and/or Wo Contrast  Result Date: 10/11/2019 CLINICAL DATA:  High probability of pulmonary embolus. Abdominal pain and fever. Shortness of breath. EXAM: CT ANGIOGRAPHY CHEST CT ABDOMEN AND PELVIS WITH CONTRAST TECHNIQUE: Multidetector CT imaging of the chest was performed using the standard protocol during bolus administration of intravenous contrast. Multiplanar CT image reconstructions and MIPs were obtained to evaluate the vascular anatomy. Multidetector CT imaging of the abdomen and pelvis was performed using the standard protocol during bolus administration of intravenous contrast. CONTRAST:  OMNIPAQUE IOHEXOL 350 MG/ML SOLN COMPARISON:  CT abdomen and pelvis 08/27/2019 FINDINGS: CTA CHEST FINDINGS Cardiovascular: Suboptimal contrast bolus limits examination of the pulmonary arteries. However, there is moderately good opacification of the central and proximal segmental pulmonary arteries. No definite filling defects are identified suggesting no definite evidence of significant pulmonary embolus.  Normal caliber thoracic aorta. Normal heart size. No pericardial effusions. Mediastinum/Nodes: No enlarged mediastinal, hilar, or axillary lymph nodes. Thyroid gland, trachea, and esophagus demonstrate no significant findings. Lungs/Pleura: Diffuse airspace infiltration throughout both lungs with perihilar distribution and sparing the periphery. Changes likely to represent edema. Mild emphysematous changes in the apices. No pleural effusions. Musculoskeletal: Pectus excavatum deformity. No destructive bone lesions. Review of the MIP images confirms the above findings. CT ABDOMEN and PELVIS FINDINGS Hepatobiliary: Moderate hepatic enlargement without focal lesion. Small amount of upper abdominal and pelvic ascites. Gallbladder and bile ducts are unremarkable. Pancreas: Unremarkable. No pancreatic ductal dilatation or surrounding inflammatory changes. Spleen: Spleen is enlarged.  No focal lesions. Adrenals/Urinary Tract: Adrenal glands are unremarkable. Kidneys are normal,  without renal calculi, focal lesion, or hydronephrosis. Bladder is unremarkable. Stomach/Bowel: Stomach is within normal limits. Appendix appears normal. No evidence of bowel wall thickening, distention, or inflammatory changes. Vascular/Lymphatic: Aortic atherosclerosis. No enlarged abdominal or pelvic lymph nodes. Reproductive: Prostate is unremarkable. Other: Mesenteric and retroperitoneal edema. No free air. Abdominal wall musculature appears intact. Musculoskeletal: No acute or significant osseous findings. Review of the MIP images confirms the above findings. IMPRESSION: 1. Suboptimal contrast bolus limits examination of the pulmonary arteries. No definite evidence of significant pulmonary embolus. 2. Diffuse airspace infiltration throughout both lungs with perihilar distribution and sparing the periphery. Changes likely to represent edema. Diffuse pneumonia possible but less likely. 3. Hepatosplenomegaly.  Similar to previous study. 4. Small  amount of upper abdominal and pelvic ascites. 5. Mesenteric and retroperitoneal edema. 6. Pectus excavatum deformity. 7. Emphysema and aortic atherosclerosis. Aortic Atherosclerosis (ICD10-I70.0) and Emphysema (ICD10-J43.9). Electronically Signed   By: Burman Nieves M.D.   On: 10/11/2019 03:37   CT ABDOMEN PELVIS W CONTRAST  Result Date: 10/11/2019 CLINICAL DATA:  High probability of pulmonary embolus. Abdominal pain and fever. Shortness of breath. EXAM: CT ANGIOGRAPHY CHEST CT ABDOMEN AND PELVIS WITH CONTRAST TECHNIQUE: Multidetector CT imaging of the chest was performed using the standard protocol during bolus administration of intravenous contrast. Multiplanar CT image reconstructions and MIPs were obtained to evaluate the vascular anatomy. Multidetector CT imaging of the abdomen and pelvis was performed using the standard protocol during bolus administration of intravenous contrast. CONTRAST:  OMNIPAQUE IOHEXOL 350 MG/ML SOLN COMPARISON:  CT abdomen and pelvis 08/27/2019 FINDINGS: CTA CHEST FINDINGS Cardiovascular: Suboptimal contrast bolus limits examination of the pulmonary arteries. However, there is moderately good opacification of the central and proximal segmental pulmonary arteries. No definite filling defects are identified suggesting no definite evidence of significant pulmonary embolus. Normal caliber thoracic aorta. Normal heart size. No pericardial effusions. Mediastinum/Nodes: No enlarged mediastinal, hilar, or axillary lymph nodes. Thyroid gland, trachea, and esophagus demonstrate no significant findings. Lungs/Pleura: Diffuse airspace infiltration throughout both lungs with perihilar distribution and sparing the periphery. Changes likely to represent edema. Mild emphysematous changes in the apices. No pleural effusions. Musculoskeletal: Pectus excavatum deformity. No destructive bone lesions. Review of the MIP images confirms the above findings. CT ABDOMEN and PELVIS FINDINGS  Hepatobiliary: Moderate hepatic enlargement without focal lesion. Small amount of upper abdominal and pelvic ascites. Gallbladder and bile ducts are unremarkable. Pancreas: Unremarkable. No pancreatic ductal dilatation or surrounding inflammatory changes. Spleen: Spleen is enlarged.  No focal lesions. Adrenals/Urinary Tract: Adrenal glands are unremarkable. Kidneys are normal, without renal calculi, focal lesion, or hydronephrosis. Bladder is unremarkable. Stomach/Bowel: Stomach is within normal limits. Appendix appears normal. No evidence of bowel wall thickening, distention, or inflammatory changes. Vascular/Lymphatic: Aortic atherosclerosis. No enlarged abdominal or pelvic lymph nodes. Reproductive: Prostate is unremarkable. Other: Mesenteric and retroperitoneal edema. No free air. Abdominal wall musculature appears intact. Musculoskeletal: No acute or significant osseous findings. Review of the MIP images confirms the above findings. IMPRESSION: 1. Suboptimal contrast bolus limits examination of the pulmonary arteries. No definite evidence of significant pulmonary embolus. 2. Diffuse airspace infiltration throughout both lungs with perihilar distribution and sparing the periphery. Changes likely to represent edema. Diffuse pneumonia possible but less likely. 3. Hepatosplenomegaly.  Similar to previous study. 4. Small amount of upper abdominal and pelvic ascites. 5. Mesenteric and retroperitoneal edema. 6. Pectus excavatum deformity. 7. Emphysema and aortic atherosclerosis. Aortic Atherosclerosis (ICD10-I70.0) and Emphysema (ICD10-J43.9). Electronically Signed   By: Marisa Cyphers.D.  On: 10/11/2019 03:37   DG Chest Port 1 View  Result Date: 10/11/2019 CLINICAL DATA:  Shortness of breath fever EXAM: PORTABLE CHEST 1 VIEW COMPARISON:  08/27/2019 FINDINGS: Low lung volumes. Mild diffuse ground-glass opacity most prominent at the lung bases. Normal heart size. No pneumothorax. IMPRESSION: Low lung volumes  with diffuse ground-glass opacity concerning for bilateral pneumonia, possible atypical or viral pneumonia. Electronically Signed   By: Jasmine Pang M.D.   On: 10/11/2019 02:16    EKG: Personally reviewed.  Rhythm is sinus tachycardia with heart rate of 135 bpm.  Notable right axis deviation.  No dynamic ST segment changes appreciated.  Assessment/Plan Principal Problem:   Acute respiratory failure with hypoxia [facility])   Patient presenting with rapid onset of respiratory distress and hypoxia upon arrival to the emergency department requiring eventual placement on BiPAP by the emergency department provider.  CT angiogram of the chest reveals no evidence of pulmonary embolism, although it is worth noting that the contrast bolus was suboptimal.  CT chest did reveal extensive bilateral infiltrates throughout both lungs.  Radiology makes mention that the infiltrates look like possible pulmonary edema however clinically this is more likely secondary to an infectious process such as extensive pneumonia considering patient's substantial leukocytosis, fever of 102.46F at home, tachycardia and lactic acidosis.  Patient possesses an excellent CD4 count of 1243 therefore opportunistic infections such as PCP are unlikely  Considering patient likely has bilateral extensive pneumonia will attempt to transition patient from BiPAP to high flow oxygen delivery  Continuing patient on broad-spectrum intravenous antibiotic therapy with cefepime and vancomycin  Providing patient with parent bronchodilator therapy  Admitting patient to stepdown unit  Blood cultures have been obtained  Sputum cultures have been ordered  Patient would likely benefit from infectious disease consultation by the day team considering history of HIV  Active Problems:   Pneumonia of both lungs due to infectious organism   Please see assessment and plan above  Sepsis secondary to pneumonia   Please see assessment and plan  above.    Lactic acidosis   Lactic acidosis on arrival likely secondary to acute hypoxic respiratory failure and pneumonia  Providing patient with broad-spectrum intravenous antibiotic therapy and supplemental oxygen  Performing serial lactic acid levels to ensure downtrending and resolution    Alcoholic cirrhosis of liver with ascites (HCC)   Temporarily holding patient's maintenance regimen of Lasix and spironolactone  While patient has diffuse abdominal tenderness on examination, patient has been experiencing similar pain since at least the beginning of 2021.  Patient has been treated for suspected SBP in June.      I do not clinically believe patient is suffering from SBP at this time.  Patient has minimal ascites at this time on examination.  It is worth noting that patient continues to drink heavily on a daily basis.  Alcohol abuse   Considering patient's continued alcohol abuse, will place patient on CIWA protocol and monitor for withdrawal symptoms  As needed benzodiazepines for withdrawal    Nicotine dependence, cigarettes, uncomplicated   Counseling patient daily on cessation  Providing patient with nicotine replacement therapy    GERD without esophagitis   Continue home regimen of proton pump inhibitor    BPH without obstruction/lower urinary tract symptoms    Continue home regimen of Flomax   Code Status:  Full code Family Communication: Deferred  Status is: Inpatient  Remains inpatient appropriate because:Ongoing diagnostic testing needed not appropriate for outpatient work up, IV treatments appropriate due to  intensity of illness or inability to take PO and Inpatient level of care appropriate due to severity of illness   Dispo: The patient is from: Home              Anticipated d/c is to: Home              Anticipated d/c date is: > 3 days              Patient currently is not medically stable to d/c.        Marinda ElkGeorge J Trinnity Breunig MD [facility] Pager [phone]- [phone]  If 7PM-7AM, please contact night-coverage [email or web address] Use universal [place] password for that web site. If you do not have the password, please call the hospital operator.  10/11/2019, 7:39 AM

## 2019-10-11 NOTE — ED Notes (Signed)
Pt requesting something "for DT's," MD made aware

## 2019-10-12 ENCOUNTER — Encounter (HOSPITAL_COMMUNITY): Payer: Self-pay | Admitting: Internal Medicine

## 2019-10-12 DIAGNOSIS — J9601 Acute respiratory failure with hypoxia: Secondary | ICD-10-CM

## 2019-10-12 LAB — CBC
HCT: 34.2 % — ABNORMAL LOW (ref 39.0–52.0)
Hemoglobin: 11.2 g/dL — ABNORMAL LOW (ref 13.0–17.0)
MCH: 31.5 pg (ref 26.0–34.0)
MCHC: 32.7 g/dL (ref 30.0–36.0)
MCV: 96.1 fL (ref 80.0–100.0)
Platelets: 168 10*3/uL (ref 150–400)
RBC: 3.56 MIL/uL — ABNORMAL LOW (ref 4.22–5.81)
RDW: 16.5 % — ABNORMAL HIGH (ref 11.5–15.5)
WBC: 12.3 10*3/uL — ABNORMAL HIGH (ref 4.0–10.5)
nRBC: 0 % (ref 0.0–0.2)

## 2019-10-12 LAB — COMPREHENSIVE METABOLIC PANEL
ALT: 20 U/L (ref 0–44)
AST: 39 U/L (ref 15–41)
Albumin: 3 g/dL — ABNORMAL LOW (ref 3.5–5.0)
Alkaline Phosphatase: 112 U/L (ref 38–126)
Anion gap: 11 (ref 5–15)
BUN: 15 mg/dL (ref 6–20)
CO2: 23 mmol/L (ref 22–32)
Calcium: 9 mg/dL (ref 8.9–10.3)
Chloride: 103 mmol/L (ref 98–111)
Creatinine, Ser: 1 mg/dL (ref 0.61–1.24)
GFR calc Af Amer: >60 mL/min (ref >60)
GFR calc non Af Amer: >60 mL/min (ref >60)
Glucose, Bld: 105 mg/dL — ABNORMAL HIGH (ref 70–99)
Potassium: 3.6 mmol/L (ref 3.5–5.1)
Sodium: 137 mmol/L (ref 135–145)
Total Bilirubin: 2.7 mg/dL — ABNORMAL HIGH (ref 0.3–1.2)
Total Protein: 7.3 g/dL (ref 6.5–8.1)

## 2019-10-12 LAB — RESPIRATORY PANEL BY PCR

## 2019-10-12 LAB — MAGNESIUM: Magnesium: 1.9 mg/dL (ref 1.7–2.4)

## 2019-10-12 MED ORDER — IBUPROFEN 600 MG PO TABS
600.0000 mg | ORAL_TABLET | Freq: Four times a day (QID) | ORAL | Status: AC
  Administered 2019-10-12 (×4): 600 mg via ORAL
  Filled 2019-10-12 (×4): qty 1

## 2019-10-12 MED ORDER — SODIUM CHLORIDE 0.9 % IV SOLN
INTRAVENOUS | Status: DC | PRN
  Administered 2019-10-12: 250 mL via INTRAVENOUS

## 2019-10-12 MED ORDER — GUAIFENESIN ER 600 MG PO TB12
600.0000 mg | ORAL_TABLET | Freq: Two times a day (BID) | ORAL | Status: DC
  Administered 2019-10-12 – 2019-10-15 (×7): 600 mg via ORAL
  Filled 2019-10-12 (×7): qty 1

## 2019-10-12 MED ORDER — IPRATROPIUM-ALBUTEROL 0.5-2.5 (3) MG/3ML IN SOLN
3.0000 mL | Freq: Four times a day (QID) | RESPIRATORY_TRACT | Status: DC
  Administered 2019-10-12 – 2019-10-15 (×11): 3 mL via RESPIRATORY_TRACT
  Filled 2019-10-12 (×11): qty 3

## 2019-10-12 NOTE — Progress Notes (Signed)
PROGRESS NOTE    Thomas Atkins  ZOX:[id number] DOB: September 07, 1975 DOA: 10/11/2019 PCP: [name], MD   Brief Narrative:   Thomas Atkins is a 44 year old male with past medical history of alcohol abuse, alcoholic cirrhosis with ascites, with multiple paracentesis last one 2 months ago, nicotine dependence, hypertension, depression, HIV(last CD4 1243, last viral load 166)gastroesophageal reflux disease who presents to ED department complaints of shortness of breath and fever.  8/4: C/o pleuritic CP. Add NSAID, guaifenesin, duonebs. Check  Assessment & Plan: Severe sepsis secondary to PNA Acute respiratory failure with hypoxia secondary to PNA Multilobar PNA     - Patient presenting with rapid onset of respiratory distress and hypoxia upon arrival to the emergency department requiring eventual placement on BiPAP by the emergency department provider.     - COVID negative     - sepsis: elevated WBC, tachypnea; source PNA, organ dysfxn: hypoxia     - CTA chest reveals no evidence of pulmonary embolism, although it is worth noting that the contrast bolus was suboptimal.  CT chest did reveal extensive bilateral infiltrates throughout both lungs.  Radiology makes mention that the infiltrates look like possible pulmonary edema however clinically this is more likely secondary to an infectious process such as extensive pneumonia considering patient's substantial leukocytosis, fever of 102.11F at home, tachycardia and lactic acidosis.     - Hx of HIV; CD4 count of 1243 therefore opportunistic infections such as PCP are unlikely     - vanc, cefepime, azithro     - c/o pleuritic pain: NSAIDs, duonebs, guaifenesin     - checking RVP; appreciate pulm assistance  Lactic acidosis     - Lactic acidosis on arrival likely secondary to acute hypoxic respiratory failure and pneumonia     - lactic acid: 2.2 > 1.2; resolved  Alcoholic cirrhosis of liver with ascites Alcohol abuse Abdominal pain     -  held lasix, aldactone d/t soft BP at admission     - CT of abdomen revealed ascites: mesenteric and retroperitoneal edema was noted     - IV lasix resumed, follow BP     - CIWA  Hx of HIV     - biktarvy     - CD4 count: 1243  Tobacco abuse     - counseled against further use     - nicotine replacement  Cocaine abuse     - counseled on abstinence  GERD without esophagitis     - protonix  BPH without obstruction/lower urinary tract symptoms     - flomax  DVT prophylaxis: lovenox Code Status: FULL Family Communication: None at bedside   Status is: Inpatient  Remains inpatient appropriate because:Inpatient level of care appropriate due to severity of illness   Dispo: The patient is from: Home              Anticipated d/c is to: Home              Anticipated d/c date is: 3 days              Patient currently is not medically stable to d/c.  Consultants:   Pulmonology  Antimicrobials:   Cefepime, azithro, Biktarvy   ROS:  Reports pleuritic CP, dyspnea. Denies ab pain, N, V . Remainder ROS is negative for all not previously mentioned.  Subjective: "It hurts when I cough."  Objective: Vitals:   10/12/19 0058 10/12/19 0200 10/12/19 0400 10/12/19 0533  BP: 106/72 91/66 103/66  Pulse:   95   Resp: (!) 28 (!) 27 (!) 28   Temp: 98.3 F (36.8 C) 98.6 F (37 C) 98.3 F (36.8 C) 98.1 F (36.7 C)  TempSrc: Oral Oral Oral Oral  SpO2: 96% 95% 96%   Weight:    82.8 kg  Height:        Intake/Output Summary (Last 24 hours) at 10/12/2019 0806 Last data filed at 10/12/2019 0600 Gross per 24 hour  Intake 909.43 ml  Output 300 ml  Net 609.43 ml   Filed Weights   10/11/19 0121 10/11/19 2042 10/12/19 0533  Weight: 92 kg 82.3 kg 82.8 kg    Examination:  General: 44 y.o. male resting in bed in NAD Cardiovascular: RRR, +S1, S2, no m/g/r, equal pulses throughout, reproducible CP Respiratory: b/l basilar rales noted, normal WOB on 5L [place] GI: BS+, NDNT, no masses  noted, no organomegaly noted MSK: No e/c/c Neuro: A&O x 3, no focal deficits Psyc: Appropriate interaction and affect, calm/cooperative   Data Reviewed: I have personally reviewed following labs and imaging studies.  CBC: Recent Labs  Lab 10/11/19 0136 10/11/19 0450 10/11/19 0812 10/12/19 0620  WBC 15.2*  --  15.8* 12.3*  NEUTROABS 13.2*  --  13.1*  --   HGB 12.5* 11.9* 12.1* 11.2*  HCT 37.6* 35.0* 37.3* 34.2*  MCV 97.4  --  98.9 96.1  PLT 192  --  164 168   Basic Metabolic Panel: Recent Labs  Lab 10/11/19 0136 10/11/19 0450 10/11/19 0812 10/12/19 0620  NA 136 142 137 137  K 3.3* 3.6 4.0 3.6  CL 107  --  107 103  CO2 16*  --  21* 23  GLUCOSE 114*  --  110* 105*  BUN 5*  --  7 15  CREATININE 0.82  --  0.92 1.00  CALCIUM 8.9  --  8.8* 9.0  MG  --   --   --  1.9   GFR: Estimated Creatinine Clearance: 104.3 mL/min (by C-G formula based on SCr of 1 mg/dL). Liver Function Tests: Recent Labs  Lab 10/11/19 0136 10/11/19 0812 10/12/19 0620  AST 46* 44* 39  ALT 23 21 20   ALKPHOS 148* 131* 112  BILITOT 2.5* 2.9* 2.7*  PROT 8.3* 7.7 7.3  ALBUMIN 3.5 3.2* 3.0*   Recent Labs  Lab 10/11/19 0142  LIPASE 31   No results for input(s): AMMONIA in the last 168 hours. Coagulation Profile: Recent Labs  Lab 10/11/19 0812  INR 1.5*   Cardiac Enzymes: No results for input(s): CKTOTAL, CKMB, CKMBINDEX, TROPONINI in the last 168 hours. BNP (last 3 results) No results for input(s): PROBNP in the last 8760 hours. HbA1C: No results for input(s): HGBA1C in the last 72 hours. CBG: Recent Labs  Lab 10/11/19 0758  GLUCAP 106*   106*   Lipid Profile: No results for input(s): CHOL, HDL, LDLCALC, TRIG, CHOLHDL, LDLDIRECT in the last 72 hours. Thyroid Function Tests: No results for input(s): TSH, T4TOTAL, FREET4, T3FREE, THYROIDAB in the last 72 hours. Anemia Panel: No results for input(s): VITAMINB12, FOLATE, FERRITIN, TIBC, IRON, RETICCTPCT in the last 72 hours. Sepsis  Labs: Recent Labs  Lab 10/11/19 0136 10/11/19 0327 10/11/19 [id number]  PROCALCITON  --   --  1.22  LATICACIDVEN 2.2* 1.2  --     Recent Results (from the past 240 hour(s))  Blood culture (routine x 2)     Status: None (Preliminary result)   Collection Time: 10/11/19  1:15 AM   Specimen: BLOOD  RIGHT ARM  Result Value Ref Range Status   Specimen Description BLOOD RIGHT ARM  Final   Special Requests   Final    BOTTLES DRAWN AEROBIC AND ANAEROBIC Blood Culture adequate volume   Culture   Final    NO GROWTH 1 DAY Performed at [facility], [address] [place], [place] [address]    Report Status PENDING  Incomplete  Blood culture (routine x 2)     Status: None (Preliminary result)   Collection Time: 10/11/19  1:33 AM   Specimen: BLOOD RIGHT HAND  Result Value Ref Range Status   Specimen Description BLOOD RIGHT HAND  Final   Special Requests AEROBIC BOTTLE ONLY Blood Culture adequate volume  Final   Culture   Final    NO GROWTH 1 DAY Performed at [facility], [address] [place], [place] [address]    Report Status PENDING  Incomplete  SARS Coronavirus 2 by RT PCR (hospital order, performed in [facility] hospital lab) Nasopharyngeal Nasopharyngeal Swab     Status: None   Collection Time: 10/11/19  2:03 AM   Specimen: Nasopharyngeal Swab  Result Value Ref Range Status   SARS Coronavirus 2 NEGATIVE NEGATIVE Final    Comment: (NOTE) SARS-CoV-2 target nucleic acids are NOT DETECTED.  The SARS-CoV-2 RNA is generally detectable in upper and lower respiratory specimens during the acute phase of infection. The lowest concentration of SARS-CoV-2 viral copies this assay can detect is 250 copies / mL. A negative result does not preclude SARS-CoV-2 infection and should not be used as the sole basis for treatment or other patient management decisions.  A negative result may occur with improper specimen collection / handling, submission of specimen other than  nasopharyngeal swab, presence of viral mutation(s) within the areas targeted by this assay, and inadequate number of viral copies (<250 copies / mL). A negative result must be combined with clinical observations, patient history, and epidemiological information.  Fact Sheet for Patients:   BoilerBrush.com.cy  Fact Sheet for Healthcare Providers: https://pope.com/  This test is not yet approved or  cleared by the [place] FDA and has been authorized for detection and/or diagnosis of SARS-CoV-2 by FDA under an Emergency Use Authorization (EUA).  This EUA will remain in effect (meaning this test can be used) for the duration of the COVID-19 declaration under Section 564(b)(1) of the Act, 21 U.S.C. section 360bbb-3(b)(1), unless the authorization is terminated or revoked sooner.  Performed at [facility], [address] [place], [place] [address]       Radiology Studies: CT Angio Chest PE W and/or Wo Contrast  Result Date: 10/11/2019 CLINICAL DATA:  High probability of pulmonary embolus. Abdominal pain and fever. Shortness of breath. EXAM: CT ANGIOGRAPHY CHEST CT ABDOMEN AND PELVIS WITH CONTRAST TECHNIQUE: Multidetector CT imaging of the chest was performed using the standard protocol during bolus administration of intravenous contrast. Multiplanar CT image reconstructions and MIPs were obtained to evaluate the vascular anatomy. Multidetector CT imaging of the abdomen and pelvis was performed using the standard protocol during bolus administration of intravenous contrast. CONTRAST:  OMNIPAQUE IOHEXOL 350 MG/ML SOLN COMPARISON:  CT abdomen and pelvis 08/27/2019 FINDINGS: CTA CHEST FINDINGS Cardiovascular: Suboptimal contrast bolus limits examination of the pulmonary arteries. However, there is moderately good opacification of the central and proximal segmental pulmonary arteries. No definite filling defects are identified  suggesting no definite evidence of significant pulmonary embolus. Normal caliber thoracic aorta. Normal heart size. No  pericardial effusions. Mediastinum/Nodes: No enlarged mediastinal, hilar, or axillary lymph nodes. Thyroid gland, trachea, and esophagus demonstrate no significant findings. Lungs/Pleura: Diffuse airspace infiltration throughout both lungs with perihilar distribution and sparing the periphery. Changes likely to represent edema. Mild emphysematous changes in the apices. No pleural effusions. Musculoskeletal: Pectus excavatum deformity. No destructive bone lesions. Review of the MIP images confirms the above findings. CT ABDOMEN and PELVIS FINDINGS Hepatobiliary: Moderate hepatic enlargement without focal lesion. Small amount of upper abdominal and pelvic ascites. Gallbladder and bile ducts are unremarkable. Pancreas: Unremarkable. No pancreatic ductal dilatation or surrounding inflammatory changes. Spleen: Spleen is enlarged.  No focal lesions. Adrenals/Urinary Tract: Adrenal glands are unremarkable. Kidneys are normal, without renal calculi, focal lesion, or hydronephrosis. Bladder is unremarkable. Stomach/Bowel: Stomach is within normal limits. Appendix appears normal. No evidence of bowel wall thickening, distention, or inflammatory changes. Vascular/Lymphatic: Aortic atherosclerosis. No enlarged abdominal or pelvic lymph nodes. Reproductive: Prostate is unremarkable. Other: Mesenteric and retroperitoneal edema. No free air. Abdominal wall musculature appears intact. Musculoskeletal: No acute or significant osseous findings. Review of the MIP images confirms the above findings. IMPRESSION: 1. Suboptimal contrast bolus limits examination of the pulmonary arteries. No definite evidence of significant pulmonary embolus. 2. Diffuse airspace infiltration throughout both lungs with perihilar distribution and sparing the periphery. Changes likely to represent edema. Diffuse pneumonia possible but less  likely. 3. Hepatosplenomegaly.  Similar to previous study. 4. Small amount of upper abdominal and pelvic ascites. 5. Mesenteric and retroperitoneal edema. 6. Pectus excavatum deformity. 7. Emphysema and aortic atherosclerosis. Aortic Atherosclerosis (ICD10-I70.0) and Emphysema (ICD10-J43.9). Electronically Signed   By: Burman Nieves M.D.   On: 10/11/2019 03:37   CT ABDOMEN PELVIS W CONTRAST  Result Date: 10/11/2019 CLINICAL DATA:  High probability of pulmonary embolus. Abdominal pain and fever. Shortness of breath. EXAM: CT ANGIOGRAPHY CHEST CT ABDOMEN AND PELVIS WITH CONTRAST TECHNIQUE: Multidetector CT imaging of the chest was performed using the standard protocol during bolus administration of intravenous contrast. Multiplanar CT image reconstructions and MIPs were obtained to evaluate the vascular anatomy. Multidetector CT imaging of the abdomen and pelvis was performed using the standard protocol during bolus administration of intravenous contrast. CONTRAST:  OMNIPAQUE IOHEXOL 350 MG/ML SOLN COMPARISON:  CT abdomen and pelvis 08/27/2019 FINDINGS: CTA CHEST FINDINGS Cardiovascular: Suboptimal contrast bolus limits examination of the pulmonary arteries. However, there is moderately good opacification of the central and proximal segmental pulmonary arteries. No definite filling defects are identified suggesting no definite evidence of significant pulmonary embolus. Normal caliber thoracic aorta. Normal heart size. No pericardial effusions. Mediastinum/Nodes: No enlarged mediastinal, hilar, or axillary lymph nodes. Thyroid gland, trachea, and esophagus demonstrate no significant findings. Lungs/Pleura: Diffuse airspace infiltration throughout both lungs with perihilar distribution and sparing the periphery. Changes likely to represent edema. Mild emphysematous changes in the apices. No pleural effusions. Musculoskeletal: Pectus excavatum deformity. No destructive bone lesions. Review of the MIP images  confirms the above findings. CT ABDOMEN and PELVIS FINDINGS Hepatobiliary: Moderate hepatic enlargement without focal lesion. Small amount of upper abdominal and pelvic ascites. Gallbladder and bile ducts are unremarkable. Pancreas: Unremarkable. No pancreatic ductal dilatation or surrounding inflammatory changes. Spleen: Spleen is enlarged.  No focal lesions. Adrenals/Urinary Tract: Adrenal glands are unremarkable. Kidneys are normal, without renal calculi, focal lesion, or hydronephrosis. Bladder is unremarkable. Stomach/Bowel: Stomach is within normal limits. Appendix appears normal. No evidence of bowel wall thickening, distention, or inflammatory changes. Vascular/Lymphatic: Aortic atherosclerosis. No enlarged abdominal or pelvic lymph nodes. Reproductive: Prostate is unremarkable. Other:  Mesenteric and retroperitoneal edema. No free air. Abdominal wall musculature appears intact. Musculoskeletal: No acute or significant osseous findings. Review of the MIP images confirms the above findings. IMPRESSION: 1. Suboptimal contrast bolus limits examination of the pulmonary arteries. No definite evidence of significant pulmonary embolus. 2. Diffuse airspace infiltration throughout both lungs with perihilar distribution and sparing the periphery. Changes likely to represent edema. Diffuse pneumonia possible but less likely. 3. Hepatosplenomegaly.  Similar to previous study. 4. Small amount of upper abdominal and pelvic ascites. 5. Mesenteric and retroperitoneal edema. 6. Pectus excavatum deformity. 7. Emphysema and aortic atherosclerosis. Aortic Atherosclerosis (ICD10-I70.0) and Emphysema (ICD10-J43.9). Electronically Signed   By: Burman Nieves M.D.   On: 10/11/2019 03:37   DG Chest Port 1 View  Result Date: 10/11/2019 CLINICAL DATA:  Shortness of breath fever EXAM: PORTABLE CHEST 1 VIEW COMPARISON:  08/27/2019 FINDINGS: Low lung volumes. Mild diffuse ground-glass opacity most prominent at the lung bases. Normal  heart size. No pneumothorax. IMPRESSION: Low lung volumes with diffuse ground-glass opacity concerning for bilateral pneumonia, possible atypical or viral pneumonia. Electronically Signed   By: Jasmine Pang M.D.   On: 10/11/2019 02:16   ECHOCARDIOGRAM COMPLETE  Result Date: 10/11/2019    ECHOCARDIOGRAM REPORT   Patient Name:   SAAJAN WILLMON Date of Exam: 10/11/2019 Medical Rec #:  [id number]    Height:       73.0 in Accession #:    [id number]   Weight:       202.8 lb Date of Birth:  June 11, 1975    BSA:          2.164 m Patient Age:    44 years     BP:           105/71 mmHg Patient Gender: M            HR:           65 bpm. Exam Location:  Inpatient Procedure: 2D Echo, Cardiac Doppler and Color Doppler Indications:    I50.23 Acute on chronic systolic (congestive) heart failure  History:        Patient has prior history of Echocardiogram examinations, most                 recent 12/30/2010. Signs/Symptoms:Syncope. Seizures. Alcoholism.                 HIV.  Sonographer:    Elmarie Shiley Dance Referring Phys: [id number] Deno Lunger [place] IMPRESSIONS  1. Left ventricular ejection fraction, by estimation, is 55 to 60%. The left ventricle has normal function. The left ventricle has no regional wall motion abnormalities. Left ventricular diastolic parameters were normal.  2. Right ventricular systolic function is normal. The right ventricular size is normal.  3. The mitral valve is normal in structure. No evidence of mitral valve regurgitation. No evidence of mitral stenosis.  4. The aortic valve is normal in structure. Aortic valve regurgitation is not visualized. No aortic stenosis is present.  5. The inferior vena cava is normal in size with greater than 50% respiratory variability, suggesting right atrial pressure of 3 mmHg. FINDINGS  Left Ventricle: Left ventricular ejection fraction, by estimation, is 55 to 60%. The left ventricle has normal function. The left ventricle has no regional wall motion abnormalities. The left  ventricular internal cavity size was normal in size. There is  no left ventricular hypertrophy. Left ventricular diastolic parameters were normal. Normal left ventricular filling pressure. Right Ventricle: The right ventricular size is normal. No increase  in right ventricular wall thickness. Right ventricular systolic function is normal. Left Atrium: Left atrial size was normal in size. Right Atrium: Right atrial size was normal in size. Pericardium: There is no evidence of pericardial effusion. Mitral Valve: The mitral valve is normal in structure. Normal mobility of the mitral valve leaflets. No evidence of mitral valve regurgitation. No evidence of mitral valve stenosis. Tricuspid Valve: The tricuspid valve is normal in structure. Tricuspid valve regurgitation is not demonstrated. No evidence of tricuspid stenosis. Aortic Valve: The aortic valve is normal in structure. Aortic valve regurgitation is not visualized. No aortic stenosis is present. Pulmonic Valve: The pulmonic valve was normal in structure. Pulmonic valve regurgitation is not visualized. No evidence of pulmonic stenosis. Aorta: The aortic root is normal in size and structure. Venous: The inferior vena cava is normal in size with greater than 50% respiratory variability, suggesting right atrial pressure of 3 mmHg. IAS/Shunts: No atrial level shunt detected by color flow Doppler.  LEFT VENTRICLE PLAX 2D LVIDd:         5.40 cm  Diastology LVIDs:         3.70 cm  LV e' lateral:   12.70 cm/s LV PW:         1.10 cm  LV E/e' lateral: 6.9 LV IVS:        0.90 cm  LV e' medial:    9.51 cm/s LVOT diam:     2.30 cm  LV E/e' medial:  9.2 LV SV:         85 LV SV Index:   39 LVOT Area:     4.15 cm  RIGHT VENTRICLE             IVC RV Basal diam:  2.70 cm     IVC diam: 1.60 cm RV S prime:     13.40 cm/s TAPSE (M-mode): 2.3 cm LEFT ATRIUM             Index       RIGHT ATRIUM           Index LA diam:        4.00 cm 1.85 cm/m  RA Area:     17.90 cm LA Vol (A2C):    88.5 ml 40.89 ml/m RA Volume:   44.40 ml  20.51 ml/m LA Vol (A4C):   50.2 ml 23.19 ml/m LA Biplane Vol: 67.0 ml 30.95 ml/m  AORTIC VALVE LVOT Vmax:   97.55 cm/s LVOT Vmean:  64.550 cm/s LVOT VTI:    0.204 m  AORTA Ao Root diam: 4.10 cm Ao Asc diam:  3.30 cm MITRAL VALVE MV Area (PHT): 2.66 cm    SHUNTS MV Decel Time: 285 msec    Systemic VTI:  0.20 m MV E velocity: 87.30 cm/s  Systemic Diam: 2.30 cm MV A velocity: 59.50 cm/s MV E/A ratio:  1.47 Mihai Croitoru MD Electronically signed by Thurmon Fair MD Signature Date/Time: 10/11/2019/2:47:45 PM    Final      Scheduled Meds:  bictegravir-emtricitabine-tenofovir AF  1 tablet Oral Daily   enoxaparin (LOVENOX) injection  40 mg Subcutaneous Q24H   folic acid  1 mg Oral Daily   furosemide  40 mg Intravenous BID   multivitamin with minerals  1 tablet Oral Daily   nicotine  7 mg Transdermal Daily   pantoprazole  40 mg Oral Daily   tamsulosin  0.4 mg Oral Q1200   thiamine  100 mg Oral Daily   Or   thiamine  100 mg Intravenous Daily   Continuous Infusions:  sodium chloride 250 mL (10/12/19 0232)   azithromycin Stopped (10/11/19 1117)   ceFEPime (MAXIPIME) IV 2 g (10/12/19 0234)   vancomycin 1,000 mg (10/12/19 0057)     LOS: 1 day    Time spent: 35 minutes spent in the coordination of care today.    Teddy Spike, DO [facility]  If 7PM-7AM, please contact night-coverage [email or web address] 10/12/2019, 8:06 AM

## 2019-10-12 NOTE — Progress Notes (Signed)
NAME:  Thomas Atkins, MRN:  [id number], DOB:  23-Feb-1976, LOS: 1 ADMISSION DATE:  10/11/2019, CONSULTATION DATE: 10/11/2019 REFERRING MD: [facility], CHIEF COMPLAINT: Bilateral pneumonia in the setting of HIV positive and liver failure  Brief History   44 year old alcoholic liver cirrhosis increased shortness of breath nausea vomiting constipation he was fluid resuscitated became more short of breath.  Volume overloaded.  Currently is hemodynamically stable sats are 96%.  History of present illness   44 year old male who has an extensive past medical history that includes cirrhosis and liver failure with multiple paracentesis last being done in June 2021.  He also has ongoing alcohol tobacco and polysubstance abuse.  Recently in March 2021 diagnosed with HIV.  He reports 24 hours of constipation coupled with abdominal pain nausea and vomiting then increasing shortness of breath.\ He has had multiple paracentesis in the past.  He presented to [facility] felt to be septic procalcitonin noted to be 1.22, lactic acid 1.2 and was started on the sepsis protocol.  Chest CT and x-ray gave the appearance of being volume overloaded and increasing shortness of breath and hypoxia was noted.  Fluid resuscitation was stopped and currently he is on 8 L nasal cannula sats 96% able to carry on conversations without difficulty able to sit on the side of the bed without difficulty O2 saturations are 96% on 8 L nasal cannula therefore O2 can be decreased intermittently on noninvasive mechanical ventilatory support but does not require that at this time.  Pulmonary critical care asked to evaluate agree with admission to stepdown unit pulmonary critical care will follow as needed.  Past Medical History   HIV positive  liver failure with cirrhosis Hypertension Chronic alcohol drug abuse Polysubstance abuse Cirrhosis requiring paracentesis anywhere from 5 to 1 L intermittent basis  Significant Hospital  Events   Admitted to the hospital shortness of breath, abdominal pain, nausea vomiting.  Consults:  10/11/2019 pulmonary critical care Procedures:    Significant Diagnostic Tests:  CT of the abdomen and chest is noted 10/11/2019 UDS positive for cocaine Micro Data:  10/11/2019 blood cultures x2 8 04/13/2019 urine culture  Antimicrobials:  10/11/2019 vancomycin 10/11/2019 cefepime 10/11/2019 Zithromax Interim history/subjective:  44 year old male with 24 hours of nausea vomiting shortness of breath in the setting of ongoing alcohol tobacco abuse.  Reports having a fever 102.6  Objective   Blood pressure 103/66, pulse 95, temperature 99 F (37.2 C), temperature source Oral, resp. rate (!) 28, height 6\' 1"  (1.854 m), weight 82.8 kg, SpO2 96 %.        Intake/Output Summary (Last 24 hours) at 10/12/2019 0845 Last data filed at 10/12/2019 0700 Gross per 24 hour  Intake 909.43 ml  Output 550 ml  Net 359.43 ml   Filed Weights   10/11/19 0121 10/11/19 2042 10/12/19 0533  Weight: 92 kg 82.3 kg 82.8 kg    Intake/Output Summary (Last 24 hours) at 10/12/2019 0853 Last data filed at 10/12/2019 0700 Gross per 24 hour  Intake 909.43 ml  Output 550 ml  Net 359.43 ml    Examination:   Resolved Hospital Problem list   General: Middle-aged male in no acute distress complaining abdominal pain along with a dry hacking cough HEENT: No JVD or lymphadenopathy Neuro: Grossly intact without focal defect CV: Heart sounds regular rate rhythm PULM: Diminished in the bases, FiO2 decreased from 8 L to 6 L with sats of 96%  GI: soft, bsx4 active, mild distention tender to touch GU: Voids  Extremities: warm/dry, negative edema  Skin: no rashes or lesions   Assessment & Plan:  Hypoxia most likely multifactorial in origin with bilateral airspace disease which could be pneumonia versus edema or both along with ongoing tobacco abuse. Continue to wean oxygen Diuresis as tolerated Bronchodilators Should  improve off cocaine    Chronic liver failure in the setting of continued alcohol abuse with cirrhosis requiring intermittent paracentesis last done in June 2021.  He is followed by Dr. Willis Modena of GI services.  Note he was suffering from constipation along with nausea and vomiting over 24-hour period. Consider GI consult Continue to monitor abdominal pain although CT scan was negative Substance abuse may be an issue with abdominal pain  Presumed sepsis from either abdominal or pulmonary source or both. Agree with antimicrobial therapy Monitor culture data Pain may be masked by ongoing cocaine abuse  Ongoing tobacco abuse Tobacco cessation counseling  Alcohol abuse with the consumption of anywhere from 4-24 beers daily Substance abuse counseling  Reported polysubstance abuse Noted to be positive for cocaine 083 2021 Substance abuse counseling  HIV positive diagnosed March 2021 I consider infectious disease  Best practice:  Diet: NPO Pain/Anxiety/Delirium protocol (if indicated): As ordered VAP protocol (if indicated): In place DVT prophylaxis: Low molecular weight heparin GI prophylaxis: PPI Glucose control: Sliding scale insulin protocol Mobility: Bedrest Code Status: Full Family Communication: Per primary team Disposition: Agree with admission to stepdown unit  Labs   CBC: Recent Labs  Lab 10/11/19 0136 10/11/19 0450 10/11/19 0812 10/12/19 0620  WBC 15.2*  --  15.8* 12.3*  NEUTROABS 13.2*  --  13.1*  --   HGB 12.5* 11.9* 12.1* 11.2*  HCT 37.6* 35.0* 37.3* 34.2*  MCV 97.4  --  98.9 96.1  PLT 192  --  164 168    Basic Metabolic Panel: Recent Labs  Lab 10/11/19 0136 10/11/19 0450 10/11/19 0812 10/12/19 0620  NA 136 142 137 137  K 3.3* 3.6 4.0 3.6  CL 107  --  107 103  CO2 16*  --  21* 23  GLUCOSE 114*  --  110* 105*  BUN 5*  --  7 15  CREATININE 0.82  --  0.92 1.00  CALCIUM 8.9  --  8.8* 9.0  MG  --   --   --  1.9   GFR: Estimated  Creatinine Clearance: 104.3 mL/min (by C-G formula based on SCr of 1 mg/dL). Recent Labs  Lab 10/11/19 0136 10/11/19 0327 10/11/19 0812 10/12/19 0620  PROCALCITON  --   --  1.22  --   WBC 15.2*  --  15.8* 12.3*  LATICACIDVEN 2.2* 1.2  --   --     Liver Function Tests: Recent Labs  Lab 10/11/19 0136 10/11/19 0812 10/12/19 0620  AST 46* 44* 39  ALT 23 21 20   ALKPHOS 148* 131* 112  BILITOT 2.5* 2.9* 2.7*  PROT 8.3* 7.7 7.3  ALBUMIN 3.5 3.2* 3.0*   Recent Labs  Lab 10/11/19 0142  LIPASE 31   No results for input(s): AMMONIA in the last 168 hours.  ABG    Component Value Date/Time   PHART 7.384 10/11/2019 0450   PCO2ART 35.5 10/11/2019 0450   PO2ART 163 (H) 10/11/2019 0450   HCO3 21.1 10/11/2019 0450   TCO2 22 10/11/2019 0450   ACIDBASEDEF 3.0 (H) 10/11/2019 0450   O2SAT 99.0 10/11/2019 0450     Coagulation Profile: Recent Labs  Lab 10/11/19 0812  INR 1.5*    Cardiac Enzymes:  No results for input(s): CKTOTAL, CKMB, CKMBINDEX, TROPONINI in the last 168 hours.  HbA1C: Hgb A1c MFr Bld  Date/Time Value Ref Range Status  05/26/2019 07:21 AM 5.9 (H) 4.8 - 5.6 % Final    Comment:    (NOTE) Pre diabetes:          5.7%-6.4% Diabetes:              >6.4% Glycemic control for   <7.0% adults with diabetes     CBG: Recent Labs  Lab 10/11/19 0758  GLUCAP 106*  106*     Steve Ita Fritzsche ACNP Acute Care Nurse Practitioner [facility]/Critical [facility] Please consult Amion 10/12/2019, 8:45 AM

## 2019-10-12 NOTE — Progress Notes (Signed)
Pt refused bed alarm and also refused low bed, educated on patient's safety but still refused to activate bed alarm, pt is alert and oriented x4, CIWA every 6 hours.

## 2019-10-12 NOTE — Progress Notes (Signed)
RT NOTE: Pt currently off BIPAP and WOB is WNL. RT will continue to monitor need for BIPAP.

## 2019-10-13 LAB — CBC
HCT: 32.7 % — ABNORMAL LOW (ref 39.0–52.0)
Hemoglobin: 10.8 g/dL — ABNORMAL LOW (ref 13.0–17.0)
MCH: 31.8 pg (ref 26.0–34.0)
MCHC: 33 g/dL (ref 30.0–36.0)
MCV: 96.2 fL (ref 80.0–100.0)
Platelets: 156 10*3/uL (ref 150–400)
RBC: 3.4 MIL/uL — ABNORMAL LOW (ref 4.22–5.81)
RDW: 16.4 % — ABNORMAL HIGH (ref 11.5–15.5)
WBC: 10.5 10*3/uL (ref 4.0–10.5)
nRBC: 0 % (ref 0.0–0.2)

## 2019-10-13 LAB — COMPREHENSIVE METABOLIC PANEL
ALT: 20 U/L (ref 0–44)
AST: 42 U/L — ABNORMAL HIGH (ref 15–41)
Albumin: 2.8 g/dL — ABNORMAL LOW (ref 3.5–5.0)
Alkaline Phosphatase: 117 U/L (ref 38–126)
Anion gap: 13 (ref 5–15)
BUN: 18 mg/dL (ref 6–20)
CO2: 22 mmol/L (ref 22–32)
Calcium: 8.7 mg/dL — ABNORMAL LOW (ref 8.9–10.3)
Chloride: 102 mmol/L (ref 98–111)
Creatinine, Ser: 1.61 mg/dL — ABNORMAL HIGH (ref 0.61–1.24)
GFR calc Af Amer: 59 mL/min — ABNORMAL LOW (ref >60)
GFR calc non Af Amer: 51 mL/min — ABNORMAL LOW (ref >60)
Glucose, Bld: 119 mg/dL — ABNORMAL HIGH (ref 70–99)
Potassium: 3.1 mmol/L — ABNORMAL LOW (ref 3.5–5.1)
Sodium: 137 mmol/L (ref 135–145)
Total Bilirubin: 2.4 mg/dL — ABNORMAL HIGH (ref 0.3–1.2)
Total Protein: 7 g/dL (ref 6.5–8.1)

## 2019-10-13 LAB — MAGNESIUM: Magnesium: 1.8 mg/dL (ref 1.7–2.4)

## 2019-10-13 LAB — VANCOMYCIN, TROUGH: Vancomycin Tr: 30 ug/mL (ref 15–20)

## 2019-10-13 MED ORDER — VANCOMYCIN VARIABLE DOSE PER UNSTABLE RENAL FUNCTION (PHARMACIST DOSING): Status: DC

## 2019-10-13 MED ORDER — LACTATED RINGERS IV BOLUS
1000.0000 mL | Freq: Once | INTRAVENOUS | Status: AC
  Administered 2019-10-13: 1000 mL via INTRAVENOUS

## 2019-10-13 MED ORDER — POTASSIUM CHLORIDE CRYS ER 20 MEQ PO TBCR
40.0000 meq | EXTENDED_RELEASE_TABLET | Freq: Two times a day (BID) | ORAL | Status: DC
  Administered 2019-10-13 – 2019-10-14 (×4): 40 meq via ORAL
  Filled 2019-10-13 (×4): qty 2

## 2019-10-13 MED ORDER — BENZONATATE 100 MG PO CAPS
100.0000 mg | ORAL_CAPSULE | Freq: Three times a day (TID) | ORAL | Status: DC
  Administered 2019-10-13 – 2019-10-15 (×6): 100 mg via ORAL
  Filled 2019-10-13 (×6): qty 1

## 2019-10-13 NOTE — Progress Notes (Signed)
   10/13/19 0020  Assess: MEWS Score  Temp 98.7 F (37.1 C)  BP (!) 88/59  Pulse Rate 84  ECG Heart Rate 84  Resp 19  Level of Consciousness Alert  SpO2 94 %  O2 Device HFNC  O2 Flow Rate (L/min) 5 L/min  Assess: MEWS Score  MEWS Temp 0  MEWS Systolic 1  MEWS Pulse 0  MEWS RR 0  MEWS LOC 0  MEWS Score 1  MEWS Score Color Green  Assess: if the MEWS score is Yellow or Red  Were vital signs taken at a resting state? Yes  Focused Assessment No change from prior assessment  Early Detection of Sepsis Score *See Row Information* Medium  MEWS guidelines implemented *See Row Information* No, previously yellow, continue vital signs every 4 hours  Treat  MEWS Interventions Administered scheduled meds/treatments (LR 1L bolus given per Dr. Rachael Darby)  Pain Scale 0-10  Pain Score 0  Notify: Charge Nurse/RN  Name of Charge Nurse/RN Notified Petra Kuba  Date Charge Nurse/RN Notified 10/13/19  Time Charge Nurse/RN Notified 0019  Notify: Provider  Provider Name/Title  (Dr. Rachael Darby)  Date Provider Notified 10/13/19  Time Provider Notified [phone]  Notification Type Page  Notification Reason Other (Comment) (hypotension)  Response See new orders  Date of Provider Response 10/13/19  Time of Provider Response [phone]

## 2019-10-13 NOTE — Progress Notes (Signed)
PROGRESS NOTE    Thomas Atkins  YDX:[id number] DOB: 20-Oct-1975 DOA: 10/11/2019 PCP: [name], MD   Brief Narrative:   Mr. Thomas Atkins is a 44 year old male with past medical history of alcohol abuse, alcoholic cirrhosis with ascites, with multiple paracentesis last one 2 months ago, nicotine dependence, hypertension, depression, HIV(last CD4 1243, last viral load 166)gastroesophageal reflux disease who presents to ED department complaints of shortness of breath and fever.  8/5: Hypotensive ON. Fluid bolus given. Afebrile, white count ok. Will hold lasix today. RVP is negative. Remains on 5L HFNC.    Assessment & Plan: Severe sepsis secondary to PNA Acute respiratory failure with hypoxia secondary to PNA Multilobar PNA     - Patient presenting with rapid onset of respiratory distress and hypoxia upon arrival to the emergency department requiring eventual placement on BiPAP by the emergency department provider.     - COVID negative     - sepsis: elevated WBC, tachypnea; source PNA, organ dysfxn: hypoxia     - CTA chest reveals no evidence of pulmonary embolism, although it is worth noting that the contrast bolus was suboptimal.  CT chest did reveal extensive bilateral infiltrates throughout both lungs.     - Radiology makes mention that the infiltrates look like possible pulmonary edema however clinically this is more likely secondary to an infectious process such as extensive pneumonia considering patient's substantial leukocytosis, fever of 102.97F at home, tachycardia and lactic acidosis.     - Hx of HIV; CD4 count of 1243 therefore opportunistic infections such as PCP are unlikely     - vanc, cefepime, azithro     - c/o pleuritic pain: NSAIDs, duonebs, guaifenesin     - 8/5: RVP is negative; he was hypotensive ON, but he was afebrile and WBC ok; received 1L fluid bolus. Hold lasix today. Add IS/FV today.  Hypotension     - fluids, follow  Lactic acidosis     - Lactic acidosis  on arrival likely secondary to acute hypoxic respiratory failure and pneumonia     - lactic acid: 2.2 > 1.2; resolved  Alcoholic cirrhosis of liver with ascites Alcohol abuse Abdominal pain     - held lasix, aldactone d/t soft BP at admission     - CT of abdomen revealed ascites: mesenteric and retroperitoneal edema was noted     - IV lasix resumed, follow BP     - CIWA  Hx of HIV     - biktarvy     - CD4 count: 1243  Tobacco abuse     - counseled against further use     - nicotine replacement  Cocaine abuse     - counseled on abstinence  GERD without esophagitis     - protonix  BPH without obstruction/lower urinary tract symptoms     - flomax  AKI     - SCr up to 1.63 today. Hold lasix. Hold ibuprofen.  Hypokalemia     - replace, monitor; Mg2+ is ok  DVT prophylaxis: lovenox Code Status: FULL Family Communication: None at bedside   Status is: Inpatient  Remains inpatient appropriate because:Inpatient level of care appropriate due to severity of illness   Dispo: The patient is from: Home              Anticipated d/c is to: Home              Anticipated d/c date is: > 3 days  Patient currently is not medically stable to d/c.  Consultants:   Pulmonology  Antimicrobials:  . Cefepime, azithro, biktarvy   ROS:  Denies CP, N, V, ab pain. Reports dyspnea is improving. Remainder ROS is negative for all not previously mentioned.  Subjective: "It doesn't really hurt today."  Objective: Vitals:   10/13/19 0020 10/13/19 0241 10/13/19 0404 10/13/19 0657  BP: (!) 88/59  90/64 110/60  Pulse: 84  85 84  Resp: 19  19 (!) 22  Temp: 98.7 F (37.1 C)  98.4 F (36.9 C)   TempSrc: Oral  Oral   SpO2: 94% 92% 92% 90%  Weight:   84.7 kg   Height:        Intake/Output Summary (Last 24 hours) at 10/13/2019 0703 Last data filed at 10/13/2019 0600 Gross per 24 hour  Intake 2470.66 ml  Output 1275 ml  Net 1195.66 ml   Filed Weights   10/11/19 2042  10/12/19 0533 10/13/19 0404  Weight: 82.3 kg 82.8 kg 84.7 kg    Examination:  General: 44 y.o. male resting in bed in NAD Cardiovascular: tachy, +S1, S2, no m/g/r, equal pulses throughout Respiratory: CTABL, no w/r/r, normal WOB GI: BS+, NDNT, no masses noted, no organomegaly noted MSK: No e/c/c Neuro: alert to name, follows commands Psyc: Appropriate interaction and affect, calm/cooperative   Data Reviewed: I have personally reviewed following labs and imaging studies.  CBC: Recent Labs  Lab 10/11/19 0136 10/11/19 0450 10/11/19 0812 10/12/19 0620 10/13/19 0536  WBC 15.2*  --  15.8* 12.3* 10.5  NEUTROABS 13.2*  --  13.1*  --   --   HGB 12.5* 11.9* 12.1* 11.2* 10.8*  HCT 37.6* 35.0* 37.3* 34.2* 32.7*  MCV 97.4  --  98.9 96.1 96.2  PLT 192  --  164 168 156   Basic Metabolic Panel: Recent Labs  Lab 10/11/19 0136 10/11/19 0450 10/11/19 0812 10/12/19 0620 10/13/19 0536  NA 136 142 137 137 137  K 3.3* 3.6 4.0 3.6 3.1*  CL 107  --  107 103 102  CO2 16*  --  21* 23 22  GLUCOSE 114*  --  110* 105* 119*  BUN 5*  --  7 15 18   CREATININE 0.82  --  0.92 1.00 1.61*  CALCIUM 8.9  --  8.8* 9.0 8.7*  MG  --   --   --  1.9 1.8   GFR: Estimated Creatinine Clearance: 64.8 mL/min (A) (by C-G formula based on SCr of 1.61 mg/dL (H)). Liver Function Tests: Recent Labs  Lab 10/11/19 0136 10/11/19 0812 10/12/19 0620 10/13/19 0536  AST 46* 44* 39 42*  ALT 23 21 20 20   ALKPHOS 148* 131* 112 117  BILITOT 2.5* 2.9* 2.7* 2.4*  PROT 8.3* 7.7 7.3 7.0  ALBUMIN 3.5 3.2* 3.0* 2.8*   Recent Labs  Lab 10/11/19 0142  LIPASE 31   No results for input(s): AMMONIA in the last 168 hours. Coagulation Profile: Recent Labs  Lab 10/11/19 0812  INR 1.5*   Cardiac Enzymes: No results for input(s): CKTOTAL, CKMB, CKMBINDEX, TROPONINI in the last 168 hours. BNP (last 3 results) No results for input(s): PROBNP in the last 8760 hours. HbA1C: No results for input(s): HGBA1C in the last  72 hours. CBG: Recent Labs  Lab 10/11/19 0758  GLUCAP 106*  106*   Lipid Profile: No results for input(s): CHOL, HDL, LDLCALC, TRIG, CHOLHDL, LDLDIRECT in the last 72 hours. Thyroid Function Tests: No results for input(s): TSH, T4TOTAL, FREET4, T3FREE, THYROIDAB  in the last 72 hours. Anemia Panel: No results for input(s): VITAMINB12, FOLATE, FERRITIN, TIBC, IRON, RETICCTPCT in the last 72 hours. Sepsis Labs: Recent Labs  Lab 10/11/19 0136 10/11/19 0327 10/11/19 0812  PROCALCITON  --   --  1.22  LATICACIDVEN 2.2* 1.2  --     Recent Results (from the past 240 hour(s))  Blood culture (routine x 2)     Status: None (Preliminary result)   Collection Time: 10/11/19  1:15 AM   Specimen: BLOOD RIGHT ARM  Result Value Ref Range Status   Specimen Description BLOOD RIGHT ARM  Final   Special Requests   Final    BOTTLES DRAWN AEROBIC AND ANAEROBIC Blood Culture adequate volume   Culture   Final    NO GROWTH 1 DAY Performed at [facility], [address] [place], [place] [address]    Report Status PENDING  Incomplete  Blood culture (routine x 2)     Status: None (Preliminary result)   Collection Time: 10/11/19  1:33 AM   Specimen: BLOOD RIGHT HAND  Result Value Ref Range Status   Specimen Description BLOOD RIGHT HAND  Final   Special Requests AEROBIC BOTTLE ONLY Blood Culture adequate volume  Final   Culture   Final    NO GROWTH 1 DAY Performed at [facility], [address] [place], [place] [address]    Report Status PENDING  Incomplete  SARS Coronavirus 2 by RT PCR (hospital order, performed in [facility] hospital lab) Nasopharyngeal Nasopharyngeal Swab     Status: None   Collection Time: 10/11/19  2:03 AM   Specimen: Nasopharyngeal Swab  Result Value Ref Range Status   SARS Coronavirus 2 NEGATIVE NEGATIVE Final    Comment: (NOTE) SARS-CoV-2 target nucleic acids are NOT DETECTED.  The SARS-CoV-2 RNA is generally detectable in upper and lower respiratory  specimens during the acute phase of infection. The lowest concentration of SARS-CoV-2 viral copies this assay can detect is 250 copies / mL. A negative result does not preclude SARS-CoV-2 infection and should not be used as the sole basis for treatment or other patient management decisions.  A negative result may occur with improper specimen collection / handling, submission of specimen other than nasopharyngeal swab, presence of viral mutation(s) within the areas targeted by this assay, and inadequate number of viral copies (<250 copies / mL). A negative result must be combined with clinical observations, patient history, and epidemiological information.  Fact Sheet for Patients:   BoilerBrush.com.cyhttps://www.fda.gov/media/136312/download  Fact Sheet for Healthcare Providers: https://pope.com/https://www.fda.gov/media/136313/download  This test is not yet approved or  cleared by the [place] FDA and has been authorized for detection and/or diagnosis of SARS-CoV-2 by FDA under an Emergency Use Authorization (EUA).  This EUA will remain in effect (meaning this test can be used) for the duration of the COVID-19 declaration under Section 564(b)(1) of the Act, 21 U.S.C. section 360bbb-3(b)(1), unless the authorization is terminated or revoked sooner.  Performed at [facility], [address] [place], [place] [address]   Respiratory Panel by PCR     Status: None   Collection Time: 10/12/19  8:07 PM   Specimen: Nasopharyngeal Swab; Respiratory  Result Value Ref Range Status   Adenovirus NOT DETECTED NOT DETECTED Final   Coronavirus 229E NOT DETECTED NOT DETECTED Final    Comment: (NOTE) The Coronavirus on the Respiratory Panel, DOES NOT test for the novel  Coronavirus (2019 nCoV)    Coronavirus HKU1 NOT DETECTED  NOT DETECTED Final   Coronavirus NL63 NOT DETECTED NOT DETECTED Final   Coronavirus OC43 NOT DETECTED NOT DETECTED Final   Metapneumovirus NOT DETECTED NOT DETECTED Final   Rhinovirus /  Enterovirus NOT DETECTED NOT DETECTED Final   Influenza A NOT DETECTED NOT DETECTED Final   Influenza B NOT DETECTED NOT DETECTED Final   Parainfluenza Virus 1 NOT DETECTED NOT DETECTED Final   Parainfluenza Virus 2 NOT DETECTED NOT DETECTED Final   Parainfluenza Virus 3 NOT DETECTED NOT DETECTED Final   Parainfluenza Virus 4 NOT DETECTED NOT DETECTED Final   Respiratory Syncytial Virus NOT DETECTED NOT DETECTED Final   Bordetella pertussis NOT DETECTED NOT DETECTED Final   Chlamydophila pneumoniae NOT DETECTED NOT DETECTED Final   Mycoplasma pneumoniae NOT DETECTED NOT DETECTED Final    Comment: Performed at [facility], [address], [place], [place] [address]      Radiology Studies: ECHOCARDIOGRAM COMPLETE  Result Date: 10/11/2019    ECHOCARDIOGRAM REPORT   Patient Name:   EVERITT WENNER Date of Exam: 10/11/2019 Medical Rec #:  [id number]    Height:       73.0 in Accession #:    [id number]   Weight:       202.8 lb Date of Birth:  09-06-1975    BSA:          2.164 m Patient Age:    44 years     BP:           105/71 mmHg Patient Gender: M            HR:           65 bpm. Exam Location:  Inpatient Procedure: 2D Echo, Cardiac Doppler and Color Doppler Indications:    I50.23 Acute on chronic systolic (congestive) heart failure  History:        Patient has prior history of Echocardiogram examinations, most                 recent 12/30/2010. Signs/Symptoms:Syncope. Seizures. Alcoholism.                 HIV.  Sonographer:    Elmarie Shiley Dance Referring Phys: [id number] Deno Lunger SHALHOUB IMPRESSIONS  1. Left ventricular ejection fraction, by estimation, is 55 to 60%. The left ventricle has normal function. The left ventricle has no regional wall motion abnormalities. Left ventricular diastolic parameters were normal.  2. Right ventricular systolic function is normal. The right ventricular size is normal.  3. The mitral valve is normal in structure. No evidence of mitral valve regurgitation. No evidence of  mitral stenosis.  4. The aortic valve is normal in structure. Aortic valve regurgitation is not visualized. No aortic stenosis is present.  5. The inferior vena cava is normal in size with greater than 50% respiratory variability, suggesting right atrial pressure of 3 mmHg. FINDINGS  Left Ventricle: Left ventricular ejection fraction, by estimation, is 55 to 60%. The left ventricle has normal function. The left ventricle has no regional wall motion abnormalities. The left ventricular internal cavity size was normal in size. There is  no left ventricular hypertrophy. Left ventricular diastolic parameters were normal. Normal left ventricular filling pressure. Right Ventricle: The right ventricular size is normal. No increase in right ventricular wall thickness. Right ventricular systolic function is normal. Left Atrium: Left atrial size was normal in size. Right Atrium: Right atrial size was normal in size. Pericardium: There is no evidence of pericardial effusion. Mitral Valve: The mitral  valve is normal in structure. Normal mobility of the mitral valve leaflets. No evidence of mitral valve regurgitation. No evidence of mitral valve stenosis. Tricuspid Valve: The tricuspid valve is normal in structure. Tricuspid valve regurgitation is not demonstrated. No evidence of tricuspid stenosis. Aortic Valve: The aortic valve is normal in structure. Aortic valve regurgitation is not visualized. No aortic stenosis is present. Pulmonic Valve: The pulmonic valve was normal in structure. Pulmonic valve regurgitation is not visualized. No evidence of pulmonic stenosis. Aorta: The aortic root is normal in size and structure. Venous: The inferior vena cava is normal in size with greater than 50% respiratory variability, suggesting right atrial pressure of 3 mmHg. IAS/Shunts: No atrial level shunt detected by color flow Doppler.  LEFT VENTRICLE PLAX 2D LVIDd:         5.40 cm  Diastology LVIDs:         3.70 cm  LV e' lateral:   12.70  cm/s LV PW:         1.10 cm  LV E/e' lateral: 6.9 LV IVS:        0.90 cm  LV e' medial:    9.51 cm/s LVOT diam:     2.30 cm  LV E/e' medial:  9.2 LV SV:         85 LV SV Index:   39 LVOT Area:     4.15 cm  RIGHT VENTRICLE             IVC RV Basal diam:  2.70 cm     IVC diam: 1.60 cm RV S prime:     13.40 cm/s TAPSE (M-mode): 2.3 cm LEFT ATRIUM             Index       RIGHT ATRIUM           Index LA diam:        4.00 cm 1.85 cm/m  RA Area:     17.90 cm LA Vol (A2C):   88.5 ml 40.89 ml/m RA Volume:   44.40 ml  20.51 ml/m LA Vol (A4C):   50.2 ml 23.19 ml/m LA Biplane Vol: 67.0 ml 30.95 ml/m  AORTIC VALVE LVOT Vmax:   97.55 cm/s LVOT Vmean:  64.550 cm/s LVOT VTI:    0.204 m  AORTA Ao Root diam: 4.10 cm Ao Asc diam:  3.30 cm MITRAL VALVE MV Area (PHT): 2.66 cm    SHUNTS MV Decel Time: 285 msec    Systemic VTI:  0.20 m MV E velocity: 87.30 cm/s  Systemic Diam: 2.30 cm MV A velocity: 59.50 cm/s MV E/A ratio:  1.47 Mihai Croitoru MD Electronically signed by Thurmon Fair MD Signature Date/Time: 10/11/2019/2:47:45 PM    Final      Scheduled Meds: . bictegravir-emtricitabine-tenofovir AF  1 tablet Oral Daily  . enoxaparin (LOVENOX) injection  40 mg Subcutaneous Q24H  . folic acid  1 mg Oral Daily  . guaiFENesin  600 mg Oral BID  . ipratropium-albuterol  3 mL Nebulization Q6H  . multivitamin with minerals  1 tablet Oral Daily  . nicotine  7 mg Transdermal Daily  . pantoprazole  40 mg Oral Daily  . potassium chloride  40 mEq Oral BID  . tamsulosin  0.4 mg Oral Q1200  . thiamine  100 mg Oral Daily   Or  . thiamine  100 mg Intravenous Daily   Continuous Infusions: . sodium chloride 250 mL (10/12/19 0232)  . azithromycin 500 mg (10/12/19 1006)  .  ceFEPime (MAXIPIME) IV 2 g (10/13/19 0400)  . vancomycin 1,000 mg (10/13/19 0457)     LOS: 2 days    Time spent: 35 minutes spent in the coordination of care today.    Teddy Spike, DO [facility]  If 7PM-7AM, please contact  night-coverage [email or web address] 10/13/2019, 7:03 AM

## 2019-10-13 NOTE — Progress Notes (Signed)
Patient declines bed alarm. Education given. 

## 2019-10-13 NOTE — Progress Notes (Signed)
Critical Vanc Tr reported to pharmacy. Pharmacist to review.

## 2019-10-13 NOTE — Progress Notes (Signed)
Pharmacy Antibiotic Note  Thomas Atkins is a 44 y.o. male admitted on 10/11/2019 with pneumonia.  Pharmacy has been consulted for Vancomycin and Cefepime dosing.   Day #3 antibiotics. Afebrile, WBC down 10.5. Cultures negative to date. Creatinine increased from 0.82 on admit to 1.61 today. Hypotensive overnight and 1L fluid bolus given.   Lasix 40 mg IV BID begun 8/3 and stopped this am. Last dose 8/4 pm. Also received Ibuprofen 400 mg QID on 8/4. I/O -405 ml so far today.    Has been on Vancomycin 1gm IV q8h.  Last Vanc dose given at 5am. Checked Vanc trough level to assess status > supratherapeutic (30 mcg/ml) as expect with increased creatinine today. Estimated vanc level will drop to < 15 mcg/ml by am, but could differ based on renal trend.  Plan:  Hold further Vancomycin doses for now.  Vancomycin random level in am.  Re-dose when Vanc level < 15 mcg/ml.  Continue Cefepime 2 gm IV q8h - currently has 5-day stop time, thru 4am dose on 10/16/19.  Also on Azithromycin 500 mg IV q24hrs.  Follow renal function, culture data, clinical progress and antibiotic plans.  Height: 6\' 1"  (185.4 cm) Weight: 84.7 kg (186 lb 12.8 oz) IBW/kg (Calculated) : 79.9  Temp (24hrs), Avg:98.5 F (36.9 C), Min:97.4 F (36.3 C), Max:98.8 F (37.1 C)  Recent Labs  Lab 10/11/19 0136 10/11/19 0327 10/11/19 0812 10/12/19 0620 10/13/19 0536 10/13/19 1151  WBC 15.2*  --  15.8* 12.3* 10.5  --   CREATININE 0.82  --  0.92 1.00 1.61*  --   LATICACIDVEN 2.2* 1.2  --   --   --   --   VANCOTROUGH  --   --   --   --   --  30*    Estimated Creatinine Clearance: 64.8 mL/min (A) (by C-G formula based on SCr of 1.61 mg/dL (H)).    Allergies  Allergen Reactions  . Bee Venom Anaphylaxis  . Lactose Intolerance (Gi) Diarrhea, Nausea Only and Other (See Comments)    Abdominal pain    . Ultram [Tramadol Hcl] Hives         Antimicrobials this admission: Vanc 8/3>> Cefepime 8/3>> (8/8 at 4am) Azithro 8/3>>  Dose  adjustments this admission:  8/5: VT 30 mcg/ml on 1gm IV q8h > holding for now, Scr up today  Microbiology results: 8/3 blood x 2: no growth x 1 day to date 8/3 COVID: negative 8/4 respiratory panel: negative   Thank you for allowing pharmacy to be a part of this patient's care.  10/4, Dennie Fetters Phone: [phone] 10/13/2019 1:21 PM

## 2019-10-13 NOTE — Progress Notes (Signed)
Pt refusing bed alarm, states he would just like some peace and quiet, they woke me up to early.

## 2019-10-13 NOTE — Progress Notes (Signed)
Flutter taken to bedside, but patient wanted to sleep. Will instruct at next treatment

## 2019-10-14 LAB — COMPREHENSIVE METABOLIC PANEL
ALT: 20 U/L (ref 0–44)
AST: 41 U/L (ref 15–41)
Albumin: 2.7 g/dL — ABNORMAL LOW (ref 3.5–5.0)
Alkaline Phosphatase: 127 U/L — ABNORMAL HIGH (ref 38–126)
Anion gap: 11 (ref 5–15)
BUN: 16 mg/dL (ref 6–20)
CO2: 20 mmol/L — ABNORMAL LOW (ref 22–32)
Calcium: 9.2 mg/dL (ref 8.9–10.3)
Chloride: 107 mmol/L (ref 98–111)
Creatinine, Ser: 1.46 mg/dL — ABNORMAL HIGH (ref 0.61–1.24)
GFR calc Af Amer: >60 mL/min (ref >60)
GFR calc non Af Amer: 57 mL/min — ABNORMAL LOW (ref >60)
Glucose, Bld: 89 mg/dL (ref 70–99)
Potassium: 4.4 mmol/L (ref 3.5–5.1)
Sodium: 138 mmol/L (ref 135–145)
Total Bilirubin: 2.6 mg/dL — ABNORMAL HIGH (ref 0.3–1.2)
Total Protein: 7.4 g/dL (ref 6.5–8.1)

## 2019-10-14 LAB — CBC
HCT: 33.5 % — ABNORMAL LOW (ref 39.0–52.0)
Hemoglobin: 11 g/dL — ABNORMAL LOW (ref 13.0–17.0)
MCH: 31.3 pg (ref 26.0–34.0)
MCHC: 32.8 g/dL (ref 30.0–36.0)
MCV: 95.4 fL (ref 80.0–100.0)
Platelets: 177 10*3/uL (ref 150–400)
RBC: 3.51 MIL/uL — ABNORMAL LOW (ref 4.22–5.81)
RDW: 16.5 % — ABNORMAL HIGH (ref 11.5–15.5)
WBC: 10.2 10*3/uL (ref 4.0–10.5)
nRBC: 0 % (ref 0.0–0.2)

## 2019-10-14 LAB — MRSA PCR SCREENING: MRSA by PCR: NEGATIVE

## 2019-10-14 LAB — VANCOMYCIN, RANDOM: Vancomycin Rm: 11

## 2019-10-14 MED ORDER — VANCOMYCIN HCL 1500 MG/300ML IV SOLN
1500.0000 mg | Freq: Two times a day (BID) | INTRAVENOUS | Status: DC
  Administered 2019-10-14 (×2): 1500 mg via INTRAVENOUS
  Filled 2019-10-14 (×3): qty 300

## 2019-10-14 NOTE — Progress Notes (Signed)
   10/14/19 0109  Assess: MEWS Score  Level of Consciousness Alert  Assess: MEWS Score  MEWS Temp 0  MEWS Systolic 0  MEWS Pulse 1  MEWS RR 1  MEWS LOC 0  MEWS Score 2  MEWS Score Color Yellow  Assess: if the MEWS score is Yellow or Red  Were vital signs taken at a resting state? Yes  Focused Assessment No change from prior assessment  Early Detection of Sepsis Score *See Row Information* Low  MEWS guidelines implemented *See Row Information* No, previously yellow, continue vital signs every 4 hours  Document  Progress note created (see row info) Yes   Patient MEWs yellow due to RR.  Patient has been previously yellow this admission.  VS will continue every four hours.

## 2019-10-14 NOTE — Progress Notes (Signed)
RT note. Pt. Refused BIPAP. Pt. Sating at 91% on 5L HFNC. RT will continue to monitor.

## 2019-10-14 NOTE — Progress Notes (Signed)
PROGRESS NOTE    TURRELL SEVERT  OIZ:[id number] DOB: [birth year] DOA: 10/11/2019 PCP: [name], MD   Brief Narrative:   Mr. Thomas Atkins is a 44 year old male with past medical history of alcohol abuse, alcoholic cirrhosis with ascites, with multiple paracentesis last one 2 months ago, nicotine dependence, hypertension, depression, HIV(last CD4 1243, last viral load 166)gastroesophageal reflux disease who presents to ED department complaints of shortness of breath and fever.  8/6: Continuing to wean O2. Afebrile. WBC normal. Let's get him mobile. Check MRSA swab. If negative, switch abx to lvq 750mg  PO qday.    Assessment & Plan:  Severe sepsis secondary to PNA Acute respiratory failure with hypoxia secondary to PNA Multilobar PNA - Patient presenting with rapid onset of respiratory distress and hypoxia upon arrival to the emergency department requiring eventual placement on BiPAP by the emergency department provider. - COVID negative - sepsis: elevated WBC, tachypnea; source PNA, organ dysfxn: hypoxia - CTA chest reveals no evidence of pulmonary embolism, although it is worth noting that the contrast bolus was suboptimal. CT chest did reveal extensive bilateral infiltrates throughout both lungs.     -Radiology makes mention that the infiltrates look like possible pulmonary edema however clinically this is more likely secondary to an infectious process such as extensive pneumonia considering patient's substantial leukocytosis, fever of 102.23F at home, tachycardia and lactic acidosis. - Hx of HIV; CD4 count of 1243 therefore opportunistic infections such as PCP are unlikely - vanc, cefepime, azithro - c/o pleuritic pain: NSAIDs, duonebs, guaifenesin - RVP is negative; IS/FV added     - 8/6: Continuing to wean O2. Afebrile. WBC normal. Let's get him mobile. Check MRSA swab. If negative, switch abx to lvq 750mg  PO qday.   Hypotension     - BP is  better this AM; Follow  Lactic acidosis - Lactic acidosis on arrival likely secondary to acute hypoxic respiratory failure and pneumonia - lactic acid: 2.2 > 1.2; resolved  Alcoholic cirrhosis of liver with ascites Alcohol abuse Abdominal pain - heldlasix, aldactone d/t soft BP at admission -CT of abdomen revealed ascites: mesenteric and retroperitoneal edema was noted - IV lasix resumed, follow BP - CIWA  Hx of HIV - biktarvy - CD4 count: 1243  Tobacco abuse - counseled against further use - nicotine replacement  Cocaine abuse - counseled on abstinence  GERD without esophagitis - protonix  BPH without obstruction/lower urinary tract symptoms - flomax  AKI     - SCr is down to 1.46 today; follow. Watch nephrotoxins  Hypokalemia     - replace, monitor; Mg2+ is ok  DVT prophylaxis: lovenox Code Status: FULL Family Communication: None at bedside.   Status is: Inpatient  Remains inpatient appropriate because:Inpatient level of care appropriate due to severity of illness   Dispo: The patient is from: Home              Anticipated d/c is to: Home              Anticipated d/c date is: > 3 days              Patient currently is not medically stable to d/c.  Consultants:   Pulmonology  Antimicrobials:  . Cefepime, azithro, biktarvy   ROS:  Denies CP, N, V, ab pain. Reports improving ab pain. Remainder ROS is negative for all not previously mentioned.  Subjective: "I want to walk."  Objective: Vitals:   10/14/19 0024 10/14/19 0108 10/14/19 0500 10/14/19 0503  BP:  111/67   109/76  Pulse: (!) 105 (!) 107  98  Resp: 18 (!) 25  18  Temp: 99.5 F (37.5 C)   98.2 F (36.8 C)  TempSrc: Oral   Oral  SpO2: 90% 91%  94%  Weight:   83.6 kg   Height:        Intake/Output Summary (Last 24 hours) at 10/14/2019 2355 Last data filed at 10/14/2019 0500 Gross per 24 hour  Intake 1216.56 ml  Output 2045  ml  Net -828.44 ml   Filed Weights   10/12/19 0533 10/13/19 0404 10/14/19 0500  Weight: 82.8 kg 84.7 kg 83.6 kg    Examination:  General: 44 y.o. male resting in bed in NAD Cardiovascular: RRR, +S1, S2, no m/g/r Respiratory: CTABL, no w/r/r, normal WOB, on 3L [place] GI: BS+, NDNT, no masses noted, no organomegaly noted MSK: No e/c/c Neuro: Alert to name, follows commands Psyc: Appropriate interaction and affect, calm/cooperative   Data Reviewed: I have personally reviewed following labs and imaging studies.  CBC: Recent Labs  Lab 10/11/19 0136 10/11/19 0136 10/11/19 0450 10/11/19 0812 10/12/19 0620 10/13/19 0536 10/14/19 0527  WBC 15.2*  --   --  15.8* 12.3* 10.5 10.2  NEUTROABS 13.2*  --   --  13.1*  --   --   --   HGB 12.5*   < > 11.9* 12.1* 11.2* 10.8* 11.0*  HCT 37.6*   < > 35.0* 37.3* 34.2* 32.7* 33.5*  MCV 97.4  --   --  98.9 96.1 96.2 95.4  PLT 192  --   --  164 168 156 177   < > = values in this interval not displayed.   Basic Metabolic Panel: Recent Labs  Lab 10/11/19 0136 10/11/19 0450 10/11/19 0812 10/12/19 0620 10/13/19 0536  NA 136 142 137 137 137  K 3.3* 3.6 4.0 3.6 3.1*  CL 107  --  107 103 102  CO2 16*  --  21* 23 22  GLUCOSE 114*  --  110* 105* 119*  BUN 5*  --  7 15 18   CREATININE 0.82  --  0.92 1.00 1.61*  CALCIUM 8.9  --  8.8* 9.0 8.7*  MG  --   --   --  1.9 1.8   GFR: Estimated Creatinine Clearance: 64.8 mL/min (A) (by C-G formula based on SCr of 1.61 mg/dL (H)). Liver Function Tests: Recent Labs  Lab 10/11/19 0136 10/11/19 0812 10/12/19 0620 10/13/19 0536  AST 46* 44* 39 42*  ALT 23 21 20 20   ALKPHOS 148* 131* 112 117  BILITOT 2.5* 2.9* 2.7* 2.4*  PROT 8.3* 7.7 7.3 7.0  ALBUMIN 3.5 3.2* 3.0* 2.8*   Recent Labs  Lab 10/11/19 0142  LIPASE 31   No results for input(s): AMMONIA in the last 168 hours. Coagulation Profile: Recent Labs  Lab 10/11/19 0812  INR 1.5*   Cardiac Enzymes: No results for input(s): CKTOTAL,  CKMB, CKMBINDEX, TROPONINI in the last 168 hours. BNP (last 3 results) No results for input(s): PROBNP in the last 8760 hours. HbA1C: No results for input(s): HGBA1C in the last 72 hours. CBG: Recent Labs  Lab 10/11/19 0758  GLUCAP 106*  106*   Lipid Profile: No results for input(s): CHOL, HDL, LDLCALC, TRIG, CHOLHDL, LDLDIRECT in the last 72 hours. Thyroid Function Tests: No results for input(s): TSH, T4TOTAL, FREET4, T3FREE, THYROIDAB in the last 72 hours. Anemia Panel: No results for input(s): VITAMINB12, FOLATE, FERRITIN, TIBC, IRON, RETICCTPCT in the last 72  hours. Sepsis Labs: Recent Labs  Lab 10/11/19 0136 10/11/19 0327 10/11/19 0812  PROCALCITON  --   --  1.22  LATICACIDVEN 2.2* 1.2  --     Recent Results (from the past 240 hour(s))  Blood culture (routine x 2)     Status: None (Preliminary result)   Collection Time: 10/11/19  1:15 AM   Specimen: BLOOD RIGHT ARM  Result Value Ref Range Status   Specimen Description BLOOD RIGHT ARM  Final   Special Requests   Final    BOTTLES DRAWN AEROBIC AND ANAEROBIC Blood Culture adequate volume   Culture   Final    NO GROWTH 2 DAYS Performed at [facility], [address] [place], [place] [address]    Report Status PENDING  Incomplete  Blood culture (routine x 2)     Status: None (Preliminary result)   Collection Time: 10/11/19  1:33 AM   Specimen: BLOOD RIGHT HAND  Result Value Ref Range Status   Specimen Description BLOOD RIGHT HAND  Final   Special Requests AEROBIC BOTTLE ONLY Blood Culture adequate volume  Final   Culture   Final    NO GROWTH 2 DAYS Performed at [facility], [address] [place], [place] [address]    Report Status PENDING  Incomplete  SARS Coronavirus 2 by RT PCR (hospital order, performed in [facility] lab) Nasopharyngeal Nasopharyngeal Swab     Status: None   Collection Time: 10/11/19  2:03 AM   Specimen: Nasopharyngeal Swab  Result Value Ref Range Status   SARS  Coronavirus 2 NEGATIVE NEGATIVE Final    Comment: (NOTE) SARS-CoV-2 target nucleic acids are NOT DETECTED.  The SARS-CoV-2 RNA is generally detectable in upper and lower respiratory specimens during the acute phase of infection. The lowest concentration of SARS-CoV-2 viral copies this assay can detect is 250 copies / mL. A negative result does not preclude SARS-CoV-2 infection and should not be used as the sole basis for treatment or other patient management decisions.  A negative result may occur with improper specimen collection / handling, submission of specimen other than nasopharyngeal swab, presence of viral mutation(s) within the areas targeted by this assay, and inadequate number of viral copies (<250 copies / mL). A negative result must be combined with clinical observations, patient history, and epidemiological information.  Fact Sheet for Patients:   BoilerBrush.com.cyhttps://www.fda.gov/media/136312/download  Fact Sheet for Healthcare Providers: https://pope.com/https://www.fda.gov/media/136313/download  This test is not yet approved or  cleared by the [place] FDA and has been authorized for detection and/or diagnosis of SARS-CoV-2 by FDA under an Emergency Use Authorization (EUA).  This EUA will remain in effect (meaning this test can be used) for the duration of the COVID-19 declaration under Section 564(b)(1) of the Act, 21 U.S.C. section 360bbb-3(b)(1), unless the authorization is terminated or revoked sooner.  Performed at [facility], [address] [place], [place] [address]   Respiratory Panel by PCR     Status: None   Collection Time: 10/12/19  8:07 PM   Specimen: Nasopharyngeal Swab; Respiratory  Result Value Ref Range Status   Adenovirus NOT DETECTED NOT DETECTED Final   Coronavirus 229E NOT DETECTED NOT DETECTED Final    Comment: (NOTE) The Coronavirus on the Respiratory Panel, DOES NOT test for the novel  Coronavirus (2019 nCoV)    Coronavirus HKU1 NOT DETECTED NOT  DETECTED Final   Coronavirus NL63 NOT DETECTED NOT DETECTED Final   Coronavirus OC43 NOT DETECTED NOT DETECTED  Final   Metapneumovirus NOT DETECTED NOT DETECTED Final   Rhinovirus / Enterovirus NOT DETECTED NOT DETECTED Final   Influenza A NOT DETECTED NOT DETECTED Final   Influenza B NOT DETECTED NOT DETECTED Final   Parainfluenza Virus 1 NOT DETECTED NOT DETECTED Final   Parainfluenza Virus 2 NOT DETECTED NOT DETECTED Final   Parainfluenza Virus 3 NOT DETECTED NOT DETECTED Final   Parainfluenza Virus 4 NOT DETECTED NOT DETECTED Final   Respiratory Syncytial Virus NOT DETECTED NOT DETECTED Final   Bordetella pertussis NOT DETECTED NOT DETECTED Final   Chlamydophila pneumoniae NOT DETECTED NOT DETECTED Final   Mycoplasma pneumoniae NOT DETECTED NOT DETECTED Final    Comment: Performed at [facility], [address], [place], [place] [address]      Radiology Studies: No results found.   Scheduled Meds: . benzonatate  100 mg Oral TID  . bictegravir-emtricitabine-tenofovir AF  1 tablet Oral Daily  . enoxaparin (LOVENOX) injection  40 mg Subcutaneous Q24H  . folic acid  1 mg Oral Daily  . guaiFENesin  600 mg Oral BID  . ipratropium-albuterol  3 mL Nebulization Q6H  . multivitamin with minerals  1 tablet Oral Daily  . nicotine  7 mg Transdermal Daily  . pantoprazole  40 mg Oral Daily  . potassium chloride  40 mEq Oral BID  . tamsulosin  0.4 mg Oral Q1200  . thiamine  100 mg Oral Daily   Or  . thiamine  100 mg Intravenous Daily  . vancomycin variable dose per unstable renal function (pharmacist dosing)   Does not apply See admin instructions   Continuous Infusions: . sodium chloride 250 mL (10/12/19 0232)  . azithromycin 500 mg (10/13/19 0807)  . ceFEPime (MAXIPIME) IV 2 g (10/14/19 0500)     LOS: 3 days    Time spent: 35 minutes spent in the coordination of care today.    Teddy Spike, DO [facility]  If 7PM-7AM, please contact  night-coverage [email or web address] 10/14/2019, 7:12 AM

## 2019-10-14 NOTE — Progress Notes (Signed)
Pharmacy Antibiotic Note  Thomas Atkins is a 44 y.o. male admitted on 10/11/2019 with pneumonia.  Pharmacy has been consulted for Vancomycin and Cefepime dosing. Hx HIV and on Biktarvy.   Day #4 antibiotics. Tmax 99.5, WBC down 10.2. Cultures negative to date. Creatinine increased from 0.82 on admit to 1.61 on 8/5 . Was hypotensive overnight and 1L fluid bolus given.   Lasix 40 mg IV BID begun 8/3 and stopped. Last dose 8/4 pm. Also received Ibuprofen 400 mg QID on 8/4.    Had been on Vancomycin 1gm IV q8h. Checked Vanc trough level on 8/5 to assess status > supratherapeutic (30 mcg/ml) as expeced with increased creatinine today. Estimated vanc level will drop to < 15 mcg/ml by 8/6 am > down to 11 mcg/ml.  Scr improved to 1.46.  Good UOP.  Plan:  Resume Vancomycin with 1gm IV q12h.  Target troughs 15-20 mcg/ml  Continue Cefepime 2 gm IV q8h - currently has 5-day stop time, thru 4am dose on 10/16/19.  Also on Azithromycin 500 mg IV q24hrs.  Follow renal function, culture data, clinical progress and antibiotic plans.  Height: 6\' 1"  (185.4 cm) Weight: 83.6 kg (184 lb 4.8 oz) IBW/kg (Calculated) : 79.9  Temp (24hrs), Avg:98.6 F (37 C), Min:98.2 F (36.8 C), Max:99.5 F (37.5 C)  Recent Labs  Lab 10/11/19 0136 10/11/19 0327 10/11/19 0812 10/12/19 0620 10/13/19 0536 10/13/19 1151 10/14/19 0527  WBC 15.2*  --  15.8* 12.3* 10.5  --  10.2  CREATININE 0.82  --  0.92 1.00 1.61*  --  1.46*  LATICACIDVEN 2.2* 1.2  --   --   --   --   --   VANCOTROUGH  --   --   --   --   --  30*  --   VANCORANDOM  --   --   --   --   --   --  11    Estimated Creatinine Clearance: 71.4 mL/min (A) (by C-G formula based on SCr of 1.46 mg/dL (H)).    Allergies  Allergen Reactions  . Bee Venom Anaphylaxis  . Lactose Intolerance (Gi) Diarrhea, Nausea Only and Other (See Comments)    Abdominal pain    . Ultram [Tramadol Hcl] Hives         Antimicrobials this admission: Vanc 8/3>> Cefepime 8/3>> (8/8 at  4am) Azithro 8/3>>  Dose adjustments this admission:  8/5: VT 30 mcg/ml on 1gm IV q8h > holding for now, Scr up 1.61  8/6: VR 11 mcg/ml - Scr 1.46 > resuming Vanc with 1gm IV q12h  Microbiology results: 8/3 blood x 2: no growth x 2 days  to date 8/3 COVID: negative 8/4 respiratory panel: negative   Thank you for allowing pharmacy to be a part of this patient's care.  10/4, Dennie Fetters Phone: [phone] 10/14/2019 9:48 AM

## 2019-10-15 LAB — RENAL FUNCTION PANEL
Albumin: 2.7 g/dL — ABNORMAL LOW (ref 3.5–5.0)
Anion gap: 12 (ref 5–15)
BUN: 15 mg/dL (ref 6–20)
CO2: 16 mmol/L — ABNORMAL LOW (ref 22–32)
Calcium: 9.4 mg/dL (ref 8.9–10.3)
Chloride: 108 mmol/L (ref 98–111)
Creatinine, Ser: 1.37 mg/dL — ABNORMAL HIGH (ref 0.61–1.24)
GFR calc Af Amer: >60 mL/min (ref >60)
GFR calc non Af Amer: >60 mL/min (ref >60)
Glucose, Bld: 90 mg/dL (ref 70–99)
Phosphorus: 4.5 mg/dL (ref 2.5–4.6)
Potassium: 4.3 mmol/L (ref 3.5–5.1)
Sodium: 136 mmol/L (ref 135–145)

## 2019-10-15 LAB — CBC
HCT: 34.7 % — ABNORMAL LOW (ref 39.0–52.0)
Hemoglobin: 11.2 g/dL — ABNORMAL LOW (ref 13.0–17.0)
MCH: 30.9 pg (ref 26.0–34.0)
MCHC: 32.3 g/dL (ref 30.0–36.0)
MCV: 95.9 fL (ref 80.0–100.0)
Platelets: 173 10*3/uL (ref 150–400)
RBC: 3.62 MIL/uL — ABNORMAL LOW (ref 4.22–5.81)
RDW: 16.2 % — ABNORMAL HIGH (ref 11.5–15.5)
WBC: 9.4 10*3/uL (ref 4.0–10.5)
nRBC: 0 % (ref 0.0–0.2)

## 2019-10-15 LAB — MAGNESIUM: Magnesium: 2.2 mg/dL (ref 1.7–2.4)

## 2019-10-15 MED ORDER — LEVOFLOXACIN 750 MG PO TABS
750.0000 mg | ORAL_TABLET | Freq: Every day | ORAL | Status: DC
  Administered 2019-10-15: 750 mg via ORAL
  Filled 2019-10-15: qty 1

## 2019-10-15 MED ORDER — LEVOFLOXACIN 750 MG PO TABS: 750.0000 mg | ORAL_TABLET | Freq: Every day | ORAL | 0 refills | Status: AC

## 2019-10-15 MED ORDER — POTASSIUM CHLORIDE CRYS ER 20 MEQ PO TBCR
20.0000 meq | EXTENDED_RELEASE_TABLET | Freq: Every day | ORAL | Status: DC
  Administered 2019-10-15: 20 meq via ORAL
  Filled 2019-10-15: qty 1

## 2019-10-15 MED ORDER — SODIUM BICARBONATE 650 MG PO TABS
650.0000 mg | ORAL_TABLET | Freq: Three times a day (TID) | ORAL | Status: DC
  Administered 2019-10-15: 650 mg via ORAL
  Filled 2019-10-15: qty 1

## 2019-10-15 NOTE — Discharge Summary (Signed)
Physician Discharge Summary  Thomas Atkins:[id number] DOB: 1975-10-25 DOA: 10/11/2019  PCP: [name], MD  Admit date: 10/11/2019 Discharge date: 10/15/2019  Admitted From: Home Disposition:  Discharged to home.   Recommendations for Outpatient Follow-up:  1. Follow up with PCP in 1 week 2. Please obtain BMP/CBC in one week  Discharge Condition: Stable  CODE STATUS: FULL   Brief/Interim Summary: Mr. Thomas Atkins is a 44 year old male with past medical history of alcohol abuse, alcoholic cirrhosis with ascites, with multiple paracentesis last one 2 months ago, nicotine dependence, hypertension, depression, HIV(last CD4 1243, last viral load 166)gastroesophageal reflux disease who presents to ED department complaints of shortness of breath and fever.  8/7: Now on RA. Switch to lvq  qday to complete 7 days total abx. He is eager to go home. He needs to follow up with PCP in 1 week. Discharge plan and instructions explained in detail. He has voiced understanding and agreement. He will be discharged today.  Discharge Diagnoses:  Severe sepsis secondary to PNA Acute respiratory failure with hypoxia secondary to PNA Multilobar PNA - Patient presenting with rapid onset of respiratory distress and hypoxia upon arrival to the emergency department requiring eventual placement on BiPAP by the emergency department provider. - COVID negative - sepsis: elevated WBC, tachypnea; source PNA, organ dysfxn: hypoxia - CTA chest reveals no evidence of pulmonary embolism, although it is worth noting that the contrast bolus was suboptimal. CT chest did reveal extensive bilateral infiltrates throughout both lungs. -Radiology makes mention that the infiltrates look like possible pulmonary edema however clinically this is more likely secondary to an infectious process such as extensive pneumonia considering patient's substantial leukocytosis, fever of 102.24F at home, tachycardia  and lactic acidosis. - Hx of HIV; CD4 count of 1243 therefore opportunistic infections such as PCP are unlikely - c/o pleuritic pain: NSAIDs, duonebs, guaifenesin -RVP is negative; IS/FV added - 8/7: MRSA is negative, switch to lvq to complete 7 days total abx; on RA. Ok to discharge to home.   Hypotension -resolved  Lactic acidosis - Lactic acidosis on arrival likely secondary to acute hypoxic respiratory failure and pneumonia - lactic acid: 2.2 > 1.2; resolved  Alcoholic cirrhosis of liver with ascites Alcohol abuse Abdominal pain - heldlasix, aldactone d/t soft BP at admission -CT of abdomen revealed ascites: mesenteric and retroperitoneal edema was noted - IV lasix resumed, follow BP - CIWA  Hx of HIV - biktarvy - CD4 count: 1243  Tobacco abuse - counseled against further use - nicotine replacement  Cocaine abuse - counseled on abstinence  GERD without esophagitis - protonix  BPH without obstruction/lower urinary tract symptoms - flomax  AKI -SCr is down to 1.37 today; follow. Watch nephrotoxins  Hypokalemia - replace, monitor; Mg2+ is ok   Discharge Instructions  Follow up with PCP in 1 week.   Allergies as of 10/15/2019      Reactions   Bee Venom Anaphylaxis   Lactose Intolerance (gi) Diarrhea, Nausea Only, Other (See Comments)   Abdominal pain     Ultram [tramadol Hcl] Hives         Medication List    TAKE these medications   BC HEADACHE POWDER PO Take 1 packet by mouth 2 (two) times daily as needed (pain/headache).   bictegravir-emtricitabine-tenofovir AF 50-200-25 MG Tabs tablet Commonly known as: BIKTARVY Take 1 tablet by mouth daily.   EX-LAX PO Take 1 tablet by mouth daily as needed (constipation).   furosemide 20 MG tablet Commonly known  as: LASIX Take 1 tablet (20 mg total) by mouth daily. What changed: when to take this   levofloxacin  750 MG tablet Commonly known as: LEVAQUIN Take 1 tablet (750 mg total) by mouth daily for 2 doses.   multivitamin with minerals Tabs tablet Take 1 tablet by mouth daily.   pantoprazole 40 MG tablet Commonly known as: Protonix Take 1 tablet (40 mg total) by mouth daily.   PEPTO-BISMOL PO Take 1 tablet by mouth daily as needed (heartburn/indigestion).   spironolactone 50 MG tablet Commonly known as: Aldactone Take 1 tablet (50 mg total) by mouth daily. What changed: when to take this   tamsulosin 0.4 MG Caps capsule Commonly known as: FLOMAX Take 1 capsule (0.4 mg total) by mouth daily. What changed: when to take this   thiamine 100 MG tablet Take 1 tablet (100 mg total) by mouth daily.   traZODone 100 MG tablet Commonly known as: DESYREL Take 1.5 tablets (150 mg total) by mouth at bedtime as needed for sleep. What changed:   how much to take  when to take this       Follow-up Information    [name], MD Follow up in 1 week(s).   Specialty: Internal Medicine Contact information: [address] [place] [address] [phone]              Allergies  Allergen Reactions  . Bee Venom Anaphylaxis  . Lactose Intolerance (Gi) Diarrhea, Nausea Only and Other (See Comments)    Abdominal pain    . Ultram [Tramadol Hcl] Hives         Procedures/Studies: CT Angio Chest PE W and/or Wo Contrast  Result Date: 10/11/2019 CLINICAL DATA:  High probability of pulmonary embolus. Abdominal pain and fever. Shortness of breath. EXAM: CT ANGIOGRAPHY CHEST CT ABDOMEN AND PELVIS WITH CONTRAST TECHNIQUE: Multidetector CT imaging of the chest was performed using the standard protocol during bolus administration of intravenous contrast. Multiplanar CT image reconstructions and MIPs were obtained to evaluate the vascular anatomy. Multidetector CT imaging of the abdomen and pelvis was performed using the standard protocol during bolus administration of intravenous  contrast. CONTRAST:  OMNIPAQUE IOHEXOL 350 MG/ML SOLN COMPARISON:  CT abdomen and pelvis 08/27/2019 FINDINGS: CTA CHEST FINDINGS Cardiovascular: Suboptimal contrast bolus limits examination of the pulmonary arteries. However, there is moderately good opacification of the central and proximal segmental pulmonary arteries. No definite filling defects are identified suggesting no definite evidence of significant pulmonary embolus. Normal caliber thoracic aorta. Normal heart size. No pericardial effusions. Mediastinum/Nodes: No enlarged mediastinal, hilar, or axillary lymph nodes. Thyroid gland, trachea, and esophagus demonstrate no significant findings. Lungs/Pleura: Diffuse airspace infiltration throughout both lungs with perihilar distribution and sparing the periphery. Changes likely to represent edema. Mild emphysematous changes in the apices. No pleural effusions. Musculoskeletal: Pectus excavatum deformity. No destructive bone lesions. Review of the MIP images confirms the above findings. CT ABDOMEN and PELVIS FINDINGS Hepatobiliary: Moderate hepatic enlargement without focal lesion. Small amount of upper abdominal and pelvic ascites. Gallbladder and bile ducts are unremarkable. Pancreas: Unremarkable. No pancreatic ductal dilatation or surrounding inflammatory changes. Spleen: Spleen is enlarged.  No focal lesions. Adrenals/Urinary Tract: Adrenal glands are unremarkable. Kidneys are normal, without renal calculi, focal lesion, or hydronephrosis. Bladder is unremarkable. Stomach/Bowel: Stomach is within normal limits. Appendix appears normal. No evidence of bowel wall thickening, distention, or inflammatory changes. Vascular/Lymphatic: Aortic atherosclerosis. No enlarged abdominal or pelvic lymph nodes. Reproductive: Prostate is unremarkable. Other: Mesenteric and retroperitoneal edema.  No free air. Abdominal wall musculature appears intact. Musculoskeletal: No acute or significant osseous findings. Review  of the MIP images confirms the above findings. IMPRESSION: 1. Suboptimal contrast bolus limits examination of the pulmonary arteries. No definite evidence of significant pulmonary embolus. 2. Diffuse airspace infiltration throughout both lungs with perihilar distribution and sparing the periphery. Changes likely to represent edema. Diffuse pneumonia possible but less likely. 3. Hepatosplenomegaly.  Similar to previous study. 4. Small amount of upper abdominal and pelvic ascites. 5. Mesenteric and retroperitoneal edema. 6. Pectus excavatum deformity. 7. Emphysema and aortic atherosclerosis. Aortic Atherosclerosis (ICD10-I70.0) and Emphysema (ICD10-J43.9). Electronically Signed   By: Burman Nieves M.D.   On: 10/11/2019 03:37   CT ABDOMEN PELVIS W CONTRAST  Result Date: 10/11/2019 CLINICAL DATA:  High probability of pulmonary embolus. Abdominal pain and fever. Shortness of breath. EXAM: CT ANGIOGRAPHY CHEST CT ABDOMEN AND PELVIS WITH CONTRAST TECHNIQUE: Multidetector CT imaging of the chest was performed using the standard protocol during bolus administration of intravenous contrast. Multiplanar CT image reconstructions and MIPs were obtained to evaluate the vascular anatomy. Multidetector CT imaging of the abdomen and pelvis was performed using the standard protocol during bolus administration of intravenous contrast. CONTRAST:  OMNIPAQUE IOHEXOL 350 MG/ML SOLN COMPARISON:  CT abdomen and pelvis 08/27/2019 FINDINGS: CTA CHEST FINDINGS Cardiovascular: Suboptimal contrast bolus limits examination of the pulmonary arteries. However, there is moderately good opacification of the central and proximal segmental pulmonary arteries. No definite filling defects are identified suggesting no definite evidence of significant pulmonary embolus. Normal caliber thoracic aorta. Normal heart size. No pericardial effusions. Mediastinum/Nodes: No enlarged mediastinal, hilar, or axillary lymph nodes. Thyroid gland, trachea,  and esophagus demonstrate no significant findings. Lungs/Pleura: Diffuse airspace infiltration throughout both lungs with perihilar distribution and sparing the periphery. Changes likely to represent edema. Mild emphysematous changes in the apices. No pleural effusions. Musculoskeletal: Pectus excavatum deformity. No destructive bone lesions. Review of the MIP images confirms the above findings. CT ABDOMEN and PELVIS FINDINGS Hepatobiliary: Moderate hepatic enlargement without focal lesion. Small amount of upper abdominal and pelvic ascites. Gallbladder and bile ducts are unremarkable. Pancreas: Unremarkable. No pancreatic ductal dilatation or surrounding inflammatory changes. Spleen: Spleen is enlarged.  No focal lesions. Adrenals/Urinary Tract: Adrenal glands are unremarkable. Kidneys are normal, without renal calculi, focal lesion, or hydronephrosis. Bladder is unremarkable. Stomach/Bowel: Stomach is within normal limits. Appendix appears normal. No evidence of bowel wall thickening, distention, or inflammatory changes. Vascular/Lymphatic: Aortic atherosclerosis. No enlarged abdominal or pelvic lymph nodes. Reproductive: Prostate is unremarkable. Other: Mesenteric and retroperitoneal edema. No free air. Abdominal wall musculature appears intact. Musculoskeletal: No acute or significant osseous findings. Review of the MIP images confirms the above findings. IMPRESSION: 1. Suboptimal contrast bolus limits examination of the pulmonary arteries. No definite evidence of significant pulmonary embolus. 2. Diffuse airspace infiltration throughout both lungs with perihilar distribution and sparing the periphery. Changes likely to represent edema. Diffuse pneumonia possible but less likely. 3. Hepatosplenomegaly.  Similar to previous study. 4. Small amount of upper abdominal and pelvic ascites. 5. Mesenteric and retroperitoneal edema. 6. Pectus excavatum deformity. 7. Emphysema and aortic atherosclerosis. Aortic  Atherosclerosis (ICD10-I70.0) and Emphysema (ICD10-J43.9). Electronically Signed   By: Burman Nieves M.D.   On: 10/11/2019 03:37   DG Chest Port 1 View  Result Date: 10/11/2019 CLINICAL DATA:  Shortness of breath fever EXAM: PORTABLE CHEST 1 VIEW COMPARISON:  08/27/2019 FINDINGS: Low lung volumes. Mild diffuse ground-glass opacity most prominent at the lung bases. Normal heart size. No  pneumothorax. IMPRESSION: Low lung volumes with diffuse ground-glass opacity concerning for bilateral pneumonia, possible atypical or viral pneumonia. Electronically Signed   By: Jasmine Pang M.D.   On: 10/11/2019 02:16   ECHOCARDIOGRAM COMPLETE  Result Date: 10/11/2019    ECHOCARDIOGRAM REPORT   Patient Name:   Thomas Atkins Date of Exam: 10/11/2019 Medical Rec #:  [id number]    Height:       73.0 in Accession #:    [id number]   Weight:       202.8 lb Date of Birth:  [birth year]    BSA:          2.164 m Patient Age:    44 years     BP:           105/71 mmHg Patient Gender: M            HR:           65 bpm. Exam Location:  Inpatient Procedure: 2D Echo, Cardiac Doppler and Color Doppler Indications:    I50.23 Acute on chronic systolic (congestive) heart failure  History:        Patient has prior history of Echocardiogram examinations, most                 recent 12/30/2010. Signs/Symptoms:Syncope. Seizures. Alcoholism.                 HIV.  Sonographer:    Elmarie Shiley Dance Referring Phys: [id number] Deno Lunger SHALHOUB IMPRESSIONS  1. Left ventricular ejection fraction, by estimation, is 55 to 60%. The left ventricle has normal function. The left ventricle has no regional wall motion abnormalities. Left ventricular diastolic parameters were normal.  2. Right ventricular systolic function is normal. The right ventricular size is normal.  3. The mitral valve is normal in structure. No evidence of mitral valve regurgitation. No evidence of mitral stenosis.  4. The aortic valve is normal in structure. Aortic valve regurgitation is not  visualized. No aortic stenosis is present.  5. The inferior vena cava is normal in size with greater than 50% respiratory variability, suggesting right atrial pressure of 3 mmHg. FINDINGS  Left Ventricle: Left ventricular ejection fraction, by estimation, is 55 to 60%. The left ventricle has normal function. The left ventricle has no regional wall motion abnormalities. The left ventricular internal cavity size was normal in size. There is  no left ventricular hypertrophy. Left ventricular diastolic parameters were normal. Normal left ventricular filling pressure. Right Ventricle: The right ventricular size is normal. No increase in right ventricular wall thickness. Right ventricular systolic function is normal. Left Atrium: Left atrial size was normal in size. Right Atrium: Right atrial size was normal in size. Pericardium: There is no evidence of pericardial effusion. Mitral Valve: The mitral valve is normal in structure. Normal mobility of the mitral valve leaflets. No evidence of mitral valve regurgitation. No evidence of mitral valve stenosis. Tricuspid Valve: The tricuspid valve is normal in structure. Tricuspid valve regurgitation is not demonstrated. No evidence of tricuspid stenosis. Aortic Valve: The aortic valve is normal in structure. Aortic valve regurgitation is not visualized. No aortic stenosis is present. Pulmonic Valve: The pulmonic valve was normal in structure. Pulmonic valve regurgitation is not visualized. No evidence of pulmonic stenosis. Aorta: The aortic root is normal in size and structure. Venous: The inferior vena cava is normal in size with greater than 50% respiratory variability, suggesting right atrial pressure of 3 mmHg. IAS/Shunts: No atrial level shunt detected by color flow  Doppler.  LEFT VENTRICLE PLAX 2D LVIDd:         5.40 cm  Diastology LVIDs:         3.70 cm  LV e' lateral:   12.70 cm/s LV PW:         1.10 cm  LV E/e' lateral: 6.9 LV IVS:        0.90 cm  LV e' medial:    9.51  cm/s LVOT diam:     2.30 cm  LV E/e' medial:  9.2 LV SV:         85 LV SV Index:   39 LVOT Area:     4.15 cm  RIGHT VENTRICLE             IVC RV Basal diam:  2.70 cm     IVC diam: 1.60 cm RV S prime:     13.40 cm/s TAPSE (M-mode): 2.3 cm LEFT ATRIUM             Index       RIGHT ATRIUM           Index LA diam:        4.00 cm 1.85 cm/m  RA Area:     17.90 cm LA Vol (A2C):   88.5 ml 40.89 ml/m RA Volume:   44.40 ml  20.51 ml/m LA Vol (A4C):   50.2 ml 23.19 ml/m LA Biplane Vol: 67.0 ml 30.95 ml/m  AORTIC VALVE LVOT Vmax:   97.55 cm/s LVOT Vmean:  64.550 cm/s LVOT VTI:    0.204 m  AORTA Ao Root diam: 4.10 cm Ao Asc diam:  3.30 cm MITRAL VALVE MV Area (PHT): 2.66 cm    SHUNTS MV Decel Time: 285 msec    Systemic VTI:  0.20 m MV E velocity: 87.30 cm/s  Systemic Diam: 2.30 cm MV A velocity: 59.50 cm/s MV E/A ratio:  1.47 Mihai Croitoru MD Electronically signed by Thurmon Fair MD Signature Date/Time: 10/11/2019/2:47:45 PM    Final    IR ABDOMEN US LIMITED  Result Date: 09/28/2019 CLINICAL DATA:  History of alcoholic cirrhosis with recurrent ascites. Please perform ascites search ultrasound ultrasound-guided paracentesis for therapeutic purposes as indicated. EXAM: LIMITED ABDOMEN ULTRASOUND FOR ASCITES TECHNIQUE: Limited ultrasound survey for ascites was performed in all four abdominal quadrants. COMPARISON:  Multiple previous ultrasound-guided paracenteses, most recently on 08/24/2019 yielding 1.4 L of peritoneal fluid FINDINGS: Sonographic evaluation of the abdomen demonstrates a trace amount of perihepatic ascites, too small to allow for safe ultrasound-guided paracentesis. No paracentesis attempted. IMPRESSION: Trace amount of perihepatic ascites, too small to allow for safe ultrasound-guided paracentesis. Electronically Signed   By: Simonne Come M.D.   On: 09/28/2019 10:46      Subjective: "I need to get out of here."  Discharge Exam: Vitals:   10/15/19 0519 10/15/19 0904  BP: 122/83   Pulse: 84    Resp: 18   Temp: 98 F (36.7 C)   SpO2: 93% 93%   Vitals:   10/15/19 0015 10/15/19 0500 10/15/19 0519 10/15/19 0904  BP: 105/73  122/83   Pulse: (!) 107  84   Resp: 20  18   Temp: 100.3 F (37.9 C)  98 F (36.7 C)   TempSrc: Oral  Oral   SpO2: 91%  93% 93%  Weight:  82.1 kg    Height:        General: 44 y.o. male resting in bed in NAD Cardiovascular: RRR, +S1, S2, no m/g/r, equal pulses throughout  Respiratory: CTABL, no w/r/r, normal WOB GI: BS+, NDNT, no masses noted, no organomegaly noted MSK: No e/c/c Neuro: A&O x 3, no focal deficits Psyc: Appropriate interaction and affect, calm/cooperative     The results of significant diagnostics from this hospitalization (including imaging, microbiology, ancillary and laboratory) are listed below for reference.     Microbiology: Recent Results (from the past 240 hour(s))  Blood culture (routine x 2)     Status: None (Preliminary result)   Collection Time: 10/11/19  1:15 AM   Specimen: BLOOD RIGHT ARM  Result Value Ref Range Status   Specimen Description BLOOD RIGHT ARM  Final   Special Requests   Final    BOTTLES DRAWN AEROBIC AND ANAEROBIC Blood Culture adequate volume   Culture   Final    NO GROWTH 4 DAYS Performed at [facility], [address] [place], [place] [address]    Report Status PENDING  Incomplete  Blood culture (routine x 2)     Status: None (Preliminary result)   Collection Time: 10/11/19  1:33 AM   Specimen: BLOOD RIGHT HAND  Result Value Ref Range Status   Specimen Description BLOOD RIGHT HAND  Final   Special Requests AEROBIC BOTTLE ONLY Blood Culture adequate volume  Final   Culture   Final    NO GROWTH 4 DAYS Performed at [facility], [address] [place], [place] [address]    Report Status PENDING  Incomplete  SARS Coronavirus 2 by RT PCR (hospital order, performed in [facility] hospital lab) Nasopharyngeal Nasopharyngeal Swab     Status: None   Collection Time: 10/11/19   2:03 AM   Specimen: Nasopharyngeal Swab  Result Value Ref Range Status   SARS Coronavirus 2 NEGATIVE NEGATIVE Final    Comment: (NOTE) SARS-CoV-2 target nucleic acids are NOT DETECTED.  The SARS-CoV-2 RNA is generally detectable in upper and lower respiratory specimens during the acute phase of infection. The lowest concentration of SARS-CoV-2 viral copies this assay can detect is 250 copies / mL. A negative result does not preclude SARS-CoV-2 infection and should not be used as the sole basis for treatment or other patient management decisions.  A negative result may occur with improper specimen collection / handling, submission of specimen other than nasopharyngeal swab, presence of viral mutation(s) within the areas targeted by this assay, and inadequate number of viral copies (<250 copies / mL). A negative result must be combined with clinical observations, patient history, and epidemiological information.  Fact Sheet for Patients:   BoilerBrush.com.cyhttps://www.fda.gov/media/136312/download  Fact Sheet for Healthcare Providers: https://pope.com/https://www.fda.gov/media/136313/download  This test is not yet approved or  cleared by the [place] FDA and has been authorized for detection and/or diagnosis of SARS-CoV-2 by FDA under an Emergency Use Authorization (EUA).  This EUA will remain in effect (meaning this test can be used) for the duration of the COVID-19 declaration under Section 564(b)(1) of the Act, 21 U.S.C. section 360bbb-3(b)(1), unless the authorization is terminated or revoked sooner.  Performed at [facility], [address] [place], [place] [address]   Respiratory Panel by PCR     Status: None   Collection Time: 10/12/19  8:07 PM   Specimen: Nasopharyngeal Swab; Respiratory  Result Value Ref Range Status   Adenovirus NOT DETECTED NOT DETECTED Final   Coronavirus 229E NOT DETECTED NOT DETECTED Final    Comment: (NOTE) The Coronavirus on the Respiratory Panel, DOES NOT test  for the novel  Coronavirus (2019 nCoV)  Coronavirus HKU1 NOT DETECTED NOT DETECTED Final   Coronavirus NL63 NOT DETECTED NOT DETECTED Final   Coronavirus OC43 NOT DETECTED NOT DETECTED Final   Metapneumovirus NOT DETECTED NOT DETECTED Final   Rhinovirus / Enterovirus NOT DETECTED NOT DETECTED Final   Influenza A NOT DETECTED NOT DETECTED Final   Influenza B NOT DETECTED NOT DETECTED Final   Parainfluenza Virus 1 NOT DETECTED NOT DETECTED Final   Parainfluenza Virus 2 NOT DETECTED NOT DETECTED Final   Parainfluenza Virus 3 NOT DETECTED NOT DETECTED Final   Parainfluenza Virus 4 NOT DETECTED NOT DETECTED Final   Respiratory Syncytial Virus NOT DETECTED NOT DETECTED Final   Bordetella pertussis NOT DETECTED NOT DETECTED Final   Chlamydophila pneumoniae NOT DETECTED NOT DETECTED Final   Mycoplasma pneumoniae NOT DETECTED NOT DETECTED Final    Comment: Performed at [facility], [address], [place], [place] [address]  MRSA PCR Screening     Status: None   Collection Time: 10/14/19  4:58 PM   Specimen: Nasal Mucosa; Nasopharyngeal  Result Value Ref Range Status   MRSA by PCR NEGATIVE NEGATIVE Final    Comment:        The GeneXpert MRSA Assay (FDA approved for NASAL specimens only), is one component of a comprehensive MRSA colonization surveillance program. It is not intended to diagnose MRSA infection nor to guide or monitor treatment for MRSA infections. Performed at [facility], [address] [place], [place] [address]      Labs: BNP (last 3 results) Recent Labs    10/11/19 0327  BNP 37.2   Basic Metabolic Panel: Recent Labs  Lab 10/11/19 0812 10/12/19 0620 10/13/19 0536 10/14/19 0527 10/15/19 0513  NA 137 137 137 138 136  K 4.0 3.6 3.1* 4.4 4.3  CL 107 103 102 107 108  CO2 21* 23 22 20* 16*  GLUCOSE 110* 105* 119* 89 90  BUN 7 15 18 16 15   CREATININE 0.92 1.00 1.61* 1.46* 1.37*  CALCIUM 8.8* 9.0 8.7* 9.2 9.4  MG  --  1.9 1.8  --  2.2   PHOS  --   --   --   --  4.5   Liver Function Tests: Recent Labs  Lab 10/11/19 0136 10/11/19 0136 10/11/19 0812 10/12/19 0620 10/13/19 0536 10/14/19 0527 10/15/19 0513  AST 46*  --  44* 39 42* 41  --   ALT 23  --  21 20 20 20   --   ALKPHOS 148*  --  131* 112 117 127*  --   BILITOT 2.5*  --  2.9* 2.7* 2.4* 2.6*  --   PROT 8.3*  --  7.7 7.3 7.0 7.4  --   ALBUMIN 3.5   < > 3.2* 3.0* 2.8* 2.7* 2.7*   < > = values in this interval not displayed.   Recent Labs  Lab 10/11/19 0142  LIPASE 31   No results for input(s): AMMONIA in the last 168 hours. CBC: Recent Labs  Lab 10/11/19 0136 10/11/19 0450 10/11/19 0102 10/12/19 0620 10/13/19 0536 10/14/19 0527 10/15/19 0513  WBC 15.2*   < > 15.8* 12.3* 10.5 10.2 9.4  NEUTROABS 13.2*  --  13.1*  --   --   --   --   HGB 12.5*   < > 12.1* 11.2* 10.8* 11.0* 11.2*  HCT 37.6*   < > 37.3* 34.2* 32.7* 33.5* 34.7*  MCV 97.4   < > 98.9 96.1 96.2 95.4 95.9  PLT 192   < >  164 168 156 177 173   < > = values in this interval not displayed.   Cardiac Enzymes: No results for input(s): CKTOTAL, CKMB, CKMBINDEX, TROPONINI in the last 168 hours. BNP: Invalid input(s): POCBNP CBG: Recent Labs  Lab 10/11/19 0758  GLUCAP 106*  106*   D-Dimer No results for input(s): DDIMER in the last 72 hours. Hgb A1c No results for input(s): HGBA1C in the last 72 hours. Lipid Profile No results for input(s): CHOL, HDL, LDLCALC, TRIG, CHOLHDL, LDLDIRECT in the last 72 hours. Thyroid function studies No results for input(s): TSH, T4TOTAL, T3FREE, THYROIDAB in the last 72 hours.  Invalid input(s): FREET3 Anemia work up No results for input(s): VITAMINB12, FOLATE, FERRITIN, TIBC, IRON, RETICCTPCT in the last 72 hours. Urinalysis    Component Value Date/Time   COLORURINE YELLOW 10/11/2019 0812   APPEARANCEUR CLEAR 10/11/2019 0812   LABSPEC 1.043 (H) 10/11/2019 0812   PHURINE 6.0 10/11/2019 0812   GLUCOSEU NEGATIVE 10/11/2019 0812   HGBUR NEGATIVE  10/11/2019 0812   BILIRUBINUR NEGATIVE 10/11/2019 0812   KETONESUR NEGATIVE 10/11/2019 0812   PROTEINUR NEGATIVE 10/11/2019 0812   UROBILINOGEN 0.2 02/19/2013 1819   NITRITE NEGATIVE 10/11/2019 0812   LEUKOCYTESUR NEGATIVE 10/11/2019 1914   Sepsis Labs Invalid input(s): PROCALCITONIN,  WBC,  LACTICIDVEN Microbiology Recent Results (from the past 240 hour(s))  Blood culture (routine x 2)     Status: None (Preliminary result)   Collection Time: 10/11/19  1:15 AM   Specimen: BLOOD RIGHT ARM  Result Value Ref Range Status   Specimen Description BLOOD RIGHT ARM  Final   Special Requests   Final    BOTTLES DRAWN AEROBIC AND ANAEROBIC Blood Culture adequate volume   Culture   Final    NO GROWTH 4 DAYS Performed at [facility], [address], [place], [place] [address]    Report Status PENDING  Incomplete  Blood culture (routine x 2)     Status: None (Preliminary result)   Collection Time: 10/11/19  1:33 AM   Specimen: BLOOD RIGHT HAND  Result Value Ref Range Status   Specimen Description BLOOD RIGHT HAND  Final   Special Requests AEROBIC BOTTLE ONLY Blood Culture adequate volume  Final   Culture   Final    NO GROWTH 4 DAYS Performed at [facility], [address] [place], [place] [address]    Report Status PENDING  Incomplete  SARS Coronavirus 2 by RT PCR (hospital order, performed in [facility] hospital lab) Nasopharyngeal Nasopharyngeal Swab     Status: None   Collection Time: 10/11/19  2:03 AM   Specimen: Nasopharyngeal Swab  Result Value Ref Range Status   SARS Coronavirus 2 NEGATIVE NEGATIVE Final    Comment: (NOTE) SARS-CoV-2 target nucleic acids are NOT DETECTED.  The SARS-CoV-2 RNA is generally detectable in upper and lower respiratory specimens during the acute phase of infection. The lowest concentration of SARS-CoV-2 viral copies this assay can detect is 250 copies / mL. A negative result does not preclude SARS-CoV-2 infection and should not be used  as the sole basis for treatment or other patient management decisions.  A negative result may occur with improper specimen collection / handling, submission of specimen other than nasopharyngeal swab, presence of viral mutation(s) within the areas targeted by this assay, and inadequate number of viral copies (<250 copies / mL). A negative result must be combined with clinical observations, patient history, and epidemiological information.  Fact Sheet for Patients:   BoilerBrush.com.cy  Fact Sheet for Healthcare Providers: https://pope.com/  This test is not yet approved or  cleared by the [place] FDA and has been authorized for detection and/or diagnosis of SARS-CoV-2 by FDA under an Emergency Use Authorization (EUA).  This EUA will remain in effect (meaning this test can be used) for the duration of the COVID-19 declaration under Section 564(b)(1) of the Act, 21 U.S.C. section 360bbb-3(b)(1), unless the authorization is terminated or revoked sooner.  Performed at [facility], [address] [place], [place] [address]   Respiratory Panel by PCR     Status: None   Collection Time: 10/12/19  8:07 PM   Specimen: Nasopharyngeal Swab; Respiratory  Result Value Ref Range Status   Adenovirus NOT DETECTED NOT DETECTED Final   Coronavirus 229E NOT DETECTED NOT DETECTED Final    Comment: (NOTE) The Coronavirus on the Respiratory Panel, DOES NOT test for the novel  Coronavirus (2019 nCoV)    Coronavirus HKU1 NOT DETECTED NOT DETECTED Final   Coronavirus NL63 NOT DETECTED NOT DETECTED Final   Coronavirus OC43 NOT DETECTED NOT DETECTED Final   Metapneumovirus NOT DETECTED NOT DETECTED Final   Rhinovirus / Enterovirus NOT DETECTED NOT DETECTED Final   Influenza A NOT DETECTED NOT DETECTED Final   Influenza B NOT DETECTED NOT DETECTED Final   Parainfluenza Virus 1 NOT DETECTED NOT DETECTED Final   Parainfluenza Virus 2 NOT  DETECTED NOT DETECTED Final   Parainfluenza Virus 3 NOT DETECTED NOT DETECTED Final   Parainfluenza Virus 4 NOT DETECTED NOT DETECTED Final   Respiratory Syncytial Virus NOT DETECTED NOT DETECTED Final   Bordetella pertussis NOT DETECTED NOT DETECTED Final   Chlamydophila pneumoniae NOT DETECTED NOT DETECTED Final   Mycoplasma pneumoniae NOT DETECTED NOT DETECTED Final    Comment: Performed at [facility], [address], [place], [place] [address]  MRSA PCR Screening     Status: None   Collection Time: 10/14/19  4:58 PM   Specimen: Nasal Mucosa; Nasopharyngeal  Result Value Ref Range Status   MRSA by PCR NEGATIVE NEGATIVE Final    Comment:        The GeneXpert MRSA Assay (FDA approved for NASAL specimens only), is one component of a comprehensive MRSA colonization surveillance program. It is not intended to diagnose MRSA infection nor to guide or monitor treatment for MRSA infections. Performed at [facility], [address], [place], [place] [address]      Time coordinating discharge: 35 minutes  SIGNED:   Teddy Spike, DO  [facility] 10/15/2019, 10:48 AM   If 7PM-7AM, please contact night-coverage [email or web address]

## 2019-10-15 NOTE — Progress Notes (Signed)
Pt refusing tele monitoring states he is "going home anyway and doesn't want them to bother him anymore" Will continue to monitor

## 2019-10-16 LAB — CULTURE, BLOOD (ROUTINE X 2)
Culture: NO GROWTH
Culture: NO GROWTH
Special Requests: ADEQUATE
Special Requests: ADEQUATE

## 2019-10-17 ENCOUNTER — Telehealth: Payer: Self-pay

## 2019-10-17 NOTE — Telephone Encounter (Signed)
Transition Care Management Follow-up Telephone Call  Date of discharge and from where: 10/15/2019, [facility]  How have you been since you were released from the hospital? He said that he is feeling good.   Any questions or concerns? None at this time  Items Reviewed:  Did the pt receive and understand the discharge instructions provided?  yes  Medications obtained and verified?yes, he has all medications and did not have any questions about the med regime.   Any new allergies since your discharge?  none reported   Do you have support at home?  yes, his roommate  No home health or DME ordered  He said that he has a Recruitment consultant in [place] and needs to call to check on the status of his medicaid application. He also noted that he is considering applying for disability but has not yet applied.      Functional Questionnaire: (I = Independent and D = Dependent) ADLs:independent  Follow up appointments reviewed:   PCP Hospital f/u appt confirmed? Dr Laural Benes, 10/24/2019 @ 1050  Specialist Hospital f/u appt confirmed?[facility] 11/15/2019  Are transportation arrangements needed? no, he takes the bus  If their condition worsens, is the pt aware to call PCP or go to the Emergency Dept.?  yes  Was the patient provided with contact information for the PCP's office or ED? he has the clinic phone number  Was to pt encouraged to call back with questions or concerns?  yes

## 2019-10-24 ENCOUNTER — Encounter: Payer: Self-pay | Admitting: Internal Medicine

## 2019-10-24 ENCOUNTER — Ambulatory Visit: Payer: Self-pay | Attending: Internal Medicine | Admitting: Internal Medicine

## 2019-10-24 ENCOUNTER — Other Ambulatory Visit: Payer: Self-pay

## 2019-10-24 VITALS — BP 118/76 | HR 92 | Temp 97.9°F | Resp 16 | Wt 186.4 lb

## 2019-10-24 DIAGNOSIS — K7031 Alcoholic cirrhosis of liver with ascites: Secondary | ICD-10-CM

## 2019-10-24 DIAGNOSIS — R825 Elevated urine levels of drugs, medicaments and biological substances: Secondary | ICD-10-CM

## 2019-10-24 DIAGNOSIS — J189 Pneumonia, unspecified organism: Secondary | ICD-10-CM

## 2019-10-24 DIAGNOSIS — Z09 Encounter for follow-up examination after completed treatment for conditions other than malignant neoplasm: Secondary | ICD-10-CM

## 2019-10-24 DIAGNOSIS — F17200 Nicotine dependence, unspecified, uncomplicated: Secondary | ICD-10-CM

## 2019-10-24 NOTE — Progress Notes (Signed)
Patient ID: Thomas Atkins, male    DOB: Feb 11, 1976  MRN: [id number]  CC: Transition of care Date of hospitalization 8/3-09/2019 Date of telephone call from case worker: 10/17/2019  Subjective: Thomas Atkins is a 44 y.o. male who presents for transition of care visit His concerns today include:  Patient with history ofHTN,HIV,alcohol use disorder, alcoholic cirrhosis with ascites, insomnia,tob dep, anx/dep, ?seizure disorder.  Patient was hospitalized with acute respiratory failure with hypoxia secondary to multi lobar pneumonia.  He required the use of BiPAP in the emergency room.  Covid test was negative.  He presented with fever, shortness of breath, hypoxia and elevated WBC.  CTA of the chest was negative for PE but the contrast bolus was suboptimal.  It did reveal extensive bilateral infiltrates throughout both lungs.  CD4 count was 1243 therefore opportunistic infection such as PCP thought to be less likely.  MRSA negative.  He was switched to Levaquin on hospital discharge.  By discharge he was on room air.  Of note urine drug screen was positive for cocaine.  Today: Since discharge, he reports no shortness of breath at rest or on exertion.  He is not had any further fever.  He has completed the 7-day course of Levaquin.  In regards to his drinking he states that "I drink a couple of beers here and there but not like I was."  He has cut back on smoking to half a pack of cigarettes per day.  Abdomen remains distended but not increased from previous.  He endorses early satiety.  He has some soreness in the left and right upper quadrants.  He has hepatosplenomegaly on imaging.  In regards to the positive urine drug screen, patient states he was hanging out with friends several days before and smoked what he thought was some marijuana.  He thinks it was laced with cocaine.  He denies any ongoing use of cocaine but states he does smoke marijuana every now and then. He has been speaking with the  caseworker in [place] named Thomas Atkins [phone]) tried to get approval for Medicaid.  This past Friday he received a letter telling him he was approved for medication services.  He brings a copy of the letter with him because he is not sure whether it is approval for Medicaid or not. Patient Active Problem List   Diagnosis Date Noted  . Acute respiratory failure with hypoxia (HCC) 10/11/2019  . Pneumonia of both lungs due to infectious organism 10/11/2019  . GERD without esophagitis 10/11/2019  . BPH without obstruction/lower urinary tract symptoms 10/11/2019  . Lactic acidosis 10/11/2019  . Sepsis due to pneumonia (HCC) 10/11/2019  . Abdominal pain 08/27/2019  . Alcoholic cirrhosis of liver with ascites (HCC) 08/27/2019  . Healthcare maintenance 08/03/2019  . Rash and nonspecific skin eruption 07/22/2019  . Ascites of liver 07/04/2019  . Constipation 06/16/2019  . HIV (human immunodeficiency virus infection) (HCC) 06/16/2019  . GI bleed 05/26/2019  . History of hypertension 07/31/2017  . Alcohol use disorder, moderate, dependence (HCC) 07/31/2017  . Nicotine dependence, cigarettes, uncomplicated 07/31/2017  . Alcohol-induced insomnia (HCC) 07/31/2017  . Major depressive disorder, recurrent severe without psychotic features (HCC) 10/10/2015  . Alcohol abuse with alcohol-induced mood disorder (HCC) 05/31/2015  . Alcohol abuse 02/16/2013     Current Outpatient Medications on File Prior to Visit  Medication Sig Dispense Refill  . Aspirin-Salicylamide-Caffeine (BC HEADACHE POWDER PO) Take 1 packet by mouth 2 (two) times daily as needed (pain/headache).    Marland Kitchen  bictegravir-emtricitabine-tenofovir AF (BIKTARVY) 50-200-25 MG TABS tablet Take 1 tablet by mouth daily. 30 tablet 2  . Bismuth Subsalicylate (PEPTO-BISMOL PO) Take 1 tablet by mouth daily as needed (heartburn/indigestion).    . furosemide (LASIX) 20 MG tablet Take 1 tablet (20 mg total) by mouth daily. (Patient taking differently:  Take 20 mg by mouth daily at 12 noon. ) 30 tablet 4  . Multiple Vitamin (MULTIVITAMIN WITH MINERALS) TABS tablet Take 1 tablet by mouth daily. 30 tablet 0  . pantoprazole (PROTONIX) 40 MG tablet Take 1 tablet (40 mg total) by mouth daily. 30 tablet 0  . Sennosides (EX-LAX PO) Take 1 tablet by mouth daily as needed (constipation).    Marland Kitchen. spironolactone (ALDACTONE) 50 MG tablet Take 1 tablet (50 mg total) by mouth daily. (Patient taking differently: Take 50 mg by mouth daily at 12 noon. ) 30 tablet 4  . tamsulosin (FLOMAX) 0.4 MG CAPS capsule Take 1 capsule (0.4 mg total) by mouth daily. (Patient taking differently: Take 0.4 mg by mouth daily at 12 noon. ) 30 capsule 3  . thiamine 100 MG tablet Take 1 tablet (100 mg total) by mouth daily. (Patient not taking: Reported on 10/11/2019) 30 tablet 0  . traZODone (DESYREL) 100 MG tablet Take 1.5 tablets (150 mg total) by mouth at bedtime as needed for sleep. (Patient taking differently: Take 200 mg by mouth at bedtime. ) 45 tablet 4   No current facility-administered medications on file prior to visit.    Allergies  Allergen Reactions  . Bee Venom Anaphylaxis  . Lactose Intolerance (Gi) Diarrhea, Nausea Only and Other (See Comments)    Abdominal pain    . Ultram [Tramadol Hcl] Hives         Social History   Socioeconomic History  . Marital status: Divorced    Spouse name: Not on file  . Number of children: 2  . Years of education: Not on file  . Highest education level: Not on file  Occupational History  . Occupation: Unemployed  Tobacco Use  . Smoking status: Current Every Day Smoker    Packs/day: 1.00    Years: 30.00    Pack years: 30.00    Types: Cigarettes  . Smokeless tobacco: Never Used  . Tobacco comment: Patient refused cessation materials  Vaping Use  . Vaping Use: Never used  Substance and Sexual Activity  . Alcohol use: Yes    Alcohol/week: 24.0 standard drinks    Types: 24 Cans of beer per week    Comment: 6 pack per day   . Drug use: Yes    Types: Marijuana, Cocaine    Comment: on occasion  . Sexual activity: Not on file  Other Topics Concern  . Not on file  Social History Narrative  . Not on file   Social Determinants of Health   Financial Resource Strain:   . Difficulty of Paying Living Expenses:   Food Insecurity:   . Worried About Programme researcher, broadcasting/film/videounning Out of Food in the Last Year:   . Baristaan Out of Food in the Last Year:   Transportation Needs:   . Freight forwarderLack of Transportation (Medical):   Marland Kitchen. Lack of Transportation (Non-Medical):   Physical Activity:   . Days of Exercise per Week:   . Minutes of Exercise per Session:   Stress:   . Feeling of Stress :   Social Connections:   . Frequency of Communication with Friends and Family:   . Frequency of Social Gatherings with Friends and Family:   .  Attends Religious Services:   . Active Member of Clubs or Organizations:   . Attends Banker Meetings:   Marland Kitchen Marital Status:   Intimate Partner Violence:   . Fear of Current or Ex-Partner:   . Emotionally Abused:   Marland Kitchen Physically Abused:   . Sexually Abused:     Family History  Problem Relation Age of Onset  . Heart disease Paternal Grandmother   . Bipolar disorder Father     Past Surgical History:  Procedure Laterality Date  . IR PARACENTESIS  07/01/2019  . IR PARACENTESIS  07/15/2019  . IR PARACENTESIS  07/27/2019  . IR PARACENTESIS  08/03/2019  . IR PARACENTESIS  08/10/2019  . IR PARACENTESIS  08/24/2019  . SURGERY SCROTAL / TESTICULAR      ROS: Review of Systems Negative except as stated above  PHYSICAL EXAM: BP 118/76   Pulse 92   Temp 97.9 F (36.6 C)   Resp 16   Wt 186 lb 6.4 oz (84.6 kg)   SpO2 96%   BMI 24.59 kg/m   Physical Exam  General appearance - alert, middle-aged Caucasian male and in no distress Mental status - normal mood, behavior, speech, dress, motor activity, and thought processes Chest - clear to auscultation, no wheezes, rales or rhonchi, symmetric air entry Heart -  normal rate, regular rhythm, normal S1, S2, no murmurs, rubs, clicks or gallops Abdomen -extended.  No significant fluid wave.  Mild tenderness in both the left and right upper quadrant.  Liver is enlarged and palpated on exam.   Extremities -no lower extremity edema CMP Latest Ref Rng & Units 10/15/2019 10/14/2019 10/13/2019  Glucose 70 - 99 mg/dL 90 89 578(I)  BUN 6 - 20 mg/dL 15 16 18   Creatinine 0.61 - 1.24 mg/dL ) 6.96(E) 9.52(W)  Sodium 135 - 145 mmol/L 136 138 137  Potassium 3.5 - 5.1 mmol/L 4.3 4.4 3.1(L)  Chloride 98 - 111 mmol/L 108 107 102  CO2 22 - 32 mmol/L 16(L) 20(L) 22  Calcium 8.9 - 10.3 mg/dL 9.4 9.2 4.13(K)  Total Protein 6.5 - 8.1 g/dL - 7.4 7.0  Total Bilirubin 0.3 - 1.2 mg/dL - 2.6(H) 2.4(H)  Alkaline Phos 38 - 126 U/L - 127(H) 117  AST 15 - 41 U/L - 41 42(H)  ALT 0 - 44 U/L - 20 20   Lipid Panel     Component Value Date/Time   CHOL 167 06/08/2019 0925   TRIG 188 (H) 06/08/2019 0925   HDL 17 (L) 06/08/2019 0925   CHOLHDL 9.8 (H) 06/08/2019 0925   LDLCALC 120 (H) 06/08/2019 0925    CBC    Component Value Date/Time   WBC 9.4 10/15/2019 0513   RBC 3.62 (L) 10/15/2019 0513   HGB 11.2 (L) 10/15/2019 0513   HGB 15.7 09/01/2018 1146   HCT 34.7 (L) 10/15/2019 0513   HCT 46.2 09/01/2018 1146   PLT 173 10/15/2019 0513   PLT 262 09/01/2018 1146   MCV 95.9 10/15/2019 0513   MCV 96 09/01/2018 1146   MCH 30.9 10/15/2019 0513   MCHC 32.3 10/15/2019 0513   RDW 16.2 (H) 10/15/2019 0513   RDW 14.7 09/01/2018 1146   LYMPHSABS 1.3 10/11/2019 0812   MONOABS 1.1 (H) 10/11/2019 0812   EOSABS 0.1 10/11/2019 0812   BASOSABS 0.1 10/11/2019 0812    ASSESSMENT AND PLAN: 1. Hospital discharge follow-up 2. Pneumonia of both lungs due to infectious organism, unspecified part of lung -Patient back to his baseline clinically and  improved.  He has completed antibiotics.  3. Alcoholic cirrhosis of liver with ascites (HCC) -Strongly encouraged him to give up drinking  alcoholic beverages altogether. -Continue furosemide and spironolactone.  Continue to limit salt. -I have given his a letter in the case worker in [place]'s phone number to our case worker.  She will try to find out whether he has been approved for Medicaid.  4. Positive urine drug screen Patient acknowledges the cocaine in the urine.  He thinks it was laced with marijuana that he had smoked a few days earlier.  He denies ongoing issue with cocaine use and declines referral to our LCSW  5. Tobacco dependence Commended him on cutting back.  Encouraged him to continue working towards quitting.  Less than 5 minutes spent on counseling.   Patient was given the opportunity to ask questions.  Patient verbalized understanding of the plan and was able to repeat key elements of the plan.   No orders of the defined types were placed in this encounter.    Requested Prescriptions    No prescriptions requested or ordered in this encounter    No follow-ups on file.  [name], MD, FACP

## 2019-10-27 ENCOUNTER — Telehealth: Payer: Self-pay

## 2019-10-27 NOTE — Telephone Encounter (Signed)
Call placed to patient's medicaid for disability caseworker, Diannia Ruder # [phone] regarding a letter that the patient received about approval for medications.  She was not sure what the letter is referring to. She said that she has been working on his medicaid application and is currently waiting for documents from [facility].  She has last contacted the hospital 10/19/2019. She acknowledged recently speaking with the patient.   Call placed to the number at the bottom of the letter # [phone] for Purchase of Medical Care Services. Message left with call back requested to this CM.   Call received from [facility]. She explained that letter is just confirming that he has been re-approved for HIV meds.  This approval is done twice a year.  He is currently approved until 12/08/2019 and this new approval will pick up after that.  She did not need any additional information. She said that the patient could call her directly # [phone] if he has any questions.  Call placed to the patient and explained above.  He said that he understands the approval for the HIV meds and did not need to call [facility].  He has been working with Ms Martin/[facility]

## 2019-11-02 ENCOUNTER — Other Ambulatory Visit: Payer: Self-pay | Admitting: Internal Medicine

## 2019-11-02 MED FILL — TAMSULOSIN HCL 0.4 MG CAP: 0.4 | 30 days supply | Qty: 30 | Fill #3

## 2019-11-02 MED FILL — FUROSEMIDE 20 MG TABS: 20 | 30 days supply | Qty: 30 | Fill #2

## 2019-11-02 MED FILL — SPIRONOLACTONE 50 MG TABS: 50 | 30 days supply | Qty: 30 | Fill #2

## 2019-11-02 MED FILL — PANTOPRAZOLE SOD DR 40 MG T: 40 | 30 days supply | Qty: 30 | Fill #0

## 2019-11-10 ENCOUNTER — Encounter: Payer: Self-pay | Admitting: Family

## 2019-11-15 ENCOUNTER — Encounter: Payer: Self-pay | Admitting: Family

## 2019-11-15 ENCOUNTER — Ambulatory Visit (INDEPENDENT_AMBULATORY_CARE_PROVIDER_SITE_OTHER): Payer: Self-pay | Admitting: Family

## 2019-11-15 ENCOUNTER — Other Ambulatory Visit: Payer: Self-pay

## 2019-11-15 VITALS — BP 126/82 | HR 98 | Wt 182.0 lb

## 2019-11-15 DIAGNOSIS — K7031 Alcoholic cirrhosis of liver with ascites: Secondary | ICD-10-CM

## 2019-11-15 DIAGNOSIS — Z0000 Encounter for general adult medical examination without abnormal findings: Secondary | ICD-10-CM

## 2019-11-15 DIAGNOSIS — Z21 Asymptomatic human immunodeficiency virus [HIV] infection status: Secondary | ICD-10-CM

## 2019-11-15 MED ORDER — BICTEGRAVIR-EMTRICITAB-TENOFOV 50-200-25 MG PO TABS: 1.0000 | ORAL_TABLET | Freq: Every day | ORAL | 2 refills | Status: DC

## 2019-11-15 NOTE — Assessment & Plan Note (Signed)
Thomas Atkins continues to have adequately controlled HIV disease with good adherence and tolerance to his ART regimen of Biktarvy.  Previously with low level viremia.  No signs/symptoms of opportunistic infection or progressive HIV disease.  Unlikely Biktarvy playing a role in his current situation as opposed to assisting it.  Check blood work today.  Continue current dose of Biktarvy.  Plan for follow-up in 3 months or sooner if needed.

## 2019-11-15 NOTE — Patient Instructions (Signed)
Nice to see you.  We will check your lab work today.  Continue to take your [place] as prescribed.    Refills will be sent to the pharmacy.  Plan for follow up in 3 months or sooner if needed.   We will work on the financial situation.

## 2019-11-15 NOTE — Assessment & Plan Note (Signed)
   Discussed importance of safe sexual practice to reduce risk of STI.  Condoms declined. 

## 2019-11-15 NOTE — Assessment & Plan Note (Signed)
Maintained on furosemide and spironolactone with management per primary care and gastroenterology.  Likely a contributing factor to his current symptoms of feeling like he has a depressed immune system.  Continue management per primary care and gastroenterology.

## 2019-11-15 NOTE — Progress Notes (Signed)
Subjective:    Patient ID: Thomas Atkins, male    DOB: May 10, 1975, 44 y.o.   MRN: [id number]  Chief Complaint  Patient presents with  . HIV Positive/AIDS     HPI:  Thomas Atkins is a 44 y.o. male with HIV disease who was last seen in the office on 09/01/19 with good adherence and tolerance to his ART regimen of Biktarvy. Viral load at the time was 166 with CD4 count of 1,243. Was recently hospitalized from 8/3-8/7 with pneumonia.  Here today for routine follow-up.  Mr. Velazquez continues to take his Biktarvy daily as prescribed and has concerns that his Susanne Borders may be leading to worsening symptoms of other conditions as he feels like everything else is "blown up and to the extreme" since he was started on Biktarvy.  Overall feeling adequate today with concern for financial situation and Medicaid which he spoke with our financial counselors about.  Mr. Wilson has no problems obtaining his medication from the pharmacy and remains covered through financial assistance with [employer].  He expresses frustration today and denies depression or symptoms of depression.  No recreational or illicit drug use (although positive for cocaine during recent hospitalization), remains a current every day tobacco user and continues to drink alcohol at a rate of approximately 6 pack/day.   Allergies  Allergen Reactions  . Bee Venom Anaphylaxis  . Lactose Intolerance (Gi) Diarrhea, Nausea Only and Other (See Comments)    Abdominal pain    . Ultram [Tramadol Hcl] Hives           Outpatient Medications Prior to Visit  Medication Sig Dispense Refill  . Aspirin-Salicylamide-Caffeine (BC HEADACHE POWDER PO) Take 1 packet by mouth 2 (two) times daily as needed (pain/headache).    . Bismuth Subsalicylate (PEPTO-BISMOL PO) Take 1 tablet by mouth daily as needed (heartburn/indigestion).    . furosemide (LASIX) 20 MG tablet Take 1 tablet (20 mg total) by mouth daily. (Patient taking differently: Take 20 mg by mouth daily  at 12 noon. ) 30 tablet 4  . Multiple Vitamin (MULTIVITAMIN WITH MINERALS) TABS tablet Take 1 tablet by mouth daily. 30 tablet 0  . pantoprazole (PROTONIX) 40 MG tablet TAKE 1 TABLET (40 MG TOTAL) BY MOUTH DAILY. 30 tablet 0  . Sennosides (EX-LAX PO) Take 1 tablet by mouth daily as needed (constipation).    Marland Kitchen spironolactone (ALDACTONE) 50 MG tablet Take 1 tablet (50 mg total) by mouth daily. (Patient taking differently: Take 50 mg by mouth daily at 12 noon. ) 30 tablet 4  . tamsulosin (FLOMAX) 0.4 MG CAPS capsule Take 1 capsule (0.4 mg total) by mouth daily. (Patient taking differently: Take 0.4 mg by mouth daily at 12 noon. ) 30 capsule 3  . thiamine 100 MG tablet Take 1 tablet (100 mg total) by mouth daily. 30 tablet 0  . traZODone (DESYREL) 100 MG tablet Take 1.5 tablets (150 mg total) by mouth at bedtime as needed for sleep. (Patient taking differently: Take 200 mg by mouth at bedtime. ) 45 tablet 4  . bictegravir-emtricitabine-tenofovir AF (BIKTARVY) 50-200-25 MG TABS tablet Take 1 tablet by mouth daily. 30 tablet 2   No facility-administered medications prior to visit.     Past Medical History:  Diagnosis Date  . Alcoholism (HCC)   . Anxiety   . Depression   . HIV infection (HCC)   . Seizure disorder (HCC)   . Syncope      Past Surgical History:  Procedure Laterality Date  .  IR PARACENTESIS  07/01/2019  . IR PARACENTESIS  07/15/2019  . IR PARACENTESIS  07/27/2019  . IR PARACENTESIS  08/03/2019  . IR PARACENTESIS  08/10/2019  . IR PARACENTESIS  08/24/2019  . SURGERY SCROTAL / TESTICULAR         Review of Systems  Constitutional: Negative for appetite change, chills, fatigue, fever and unexpected weight change.  Eyes: Negative for visual disturbance.  Respiratory: Negative for cough, chest tightness, shortness of breath and wheezing.   Cardiovascular: Negative for chest pain and leg swelling.  Gastrointestinal: Negative for abdominal pain, constipation, diarrhea, nausea and  vomiting.  Genitourinary: Negative for dysuria, flank pain, frequency, genital sores, hematuria and urgency.  Skin: Negative for rash.  Allergic/Immunologic: Negative for immunocompromised state.  Neurological: Negative for dizziness and headaches.      Objective:    BP 126/82   Pulse 98   Wt 182 lb (82.6 kg)   SpO2 96%   BMI 24.01 kg/m  Nursing note and vital signs reviewed.  Physical Exam Constitutional:      General: He is not in acute distress.    Appearance: He is well-developed.  Eyes:     Conjunctiva/sclera: Conjunctivae normal.  Cardiovascular:     Rate and Rhythm: Normal rate and regular rhythm.     Heart sounds: Normal heart sounds. No murmur heard.  No friction rub. No gallop.   Pulmonary:     Effort: Pulmonary effort is normal. No respiratory distress.     Breath sounds: Normal breath sounds. No wheezing or rales.  Chest:     Chest wall: No tenderness.  Abdominal:     General: Bowel sounds are normal. There is distension.     Tenderness: There is no abdominal tenderness.     Comments: Ascites present  Musculoskeletal:     Cervical back: Neck supple.  Lymphadenopathy:     Cervical: No cervical adenopathy.  Skin:    General: Skin is warm and dry.     Findings: No rash.  Neurological:     Mental Status: He is alert and oriented to person, place, and time.  Psychiatric:        Mood and Affect: Mood normal.      Depression screen [facility] 2/9 11/15/2019 10/24/2019 08/09/2019 08/03/2019 07/22/2019  Decreased Interest 1 1 3  0 3  Down, Depressed, Hopeless 1 1 3 1 3   PHQ - 2 Score 2 2 6 1 6   Altered sleeping - 1 2 - 3  Tired, decreased energy - 0 3 - 3  Change in appetite - 1 2 - 0  Feeling bad or failure about yourself  - 1 0 - 0  Trouble concentrating - 1 1 - 1  Moving slowly or fidgety/restless - 0 0 - 3  Suicidal thoughts - 0 0 - 3  PHQ-9 Score - 6 14 - 19       Assessment & Plan:    Patient Active Problem List   Diagnosis Date Noted  . GERD without  esophagitis 10/11/2019  . BPH without obstruction/lower urinary tract symptoms 10/11/2019  . Abdominal pain 08/27/2019  . Alcoholic cirrhosis of liver with ascites (HCC) 08/27/2019  . Healthcare maintenance 08/03/2019  . Rash and nonspecific skin eruption 07/22/2019  . Constipation 06/16/2019  . HIV (human immunodeficiency virus infection) (HCC) 06/16/2019  . GI bleed 05/26/2019  . History of hypertension 07/31/2017  . Alcohol use disorder, moderate, dependence (HCC) 07/31/2017  . Nicotine dependence, cigarettes, uncomplicated 07/31/2017  . Alcohol-induced insomnia (HCC)  07/31/2017  . Major depressive disorder, recurrent severe without psychotic features (HCC) 10/10/2015  . Alcohol abuse with alcohol-induced mood disorder (HCC) 05/31/2015  . Alcohol abuse 02/16/2013     Problem List Items Addressed This Visit      Digestive   Alcoholic cirrhosis of liver with ascites (HCC)    Maintained on furosemide and spironolactone with management per primary care and gastroenterology.  Likely a contributing factor to his current symptoms of feeling like he has a depressed immune system.  Continue management per primary care and gastroenterology.        Other   HIV (human immunodeficiency virus infection) (HCC) - Primary    Mr. Jacuinde continues to have adequately controlled HIV disease with good adherence and tolerance to his ART regimen of Biktarvy.  Previously with low level viremia.  No signs/symptoms of opportunistic infection or progressive HIV disease.  Unlikely Biktarvy playing a role in his current situation as opposed to assisting it.  Check blood work today.  Continue current dose of Biktarvy.  Plan for follow-up in 3 months or sooner if needed.      Relevant Medications   bictegravir-emtricitabine-tenofovir AF (BIKTARVY) 50-200-25 MG TABS tablet   Other Relevant Orders   HIV-1 RNA quant-no reflex-bld   T-helper cell (CD4)- ([facility] clinic only)   Healthcare maintenance     Discussed  importance of safe sexual practice to reduce risk of STI.  Condoms declined.          I am having Hyatt P. Granja maintain his tamsulosin, traZODone, spironolactone, furosemide, Sennosides (EX-LAX PO), Bismuth Subsalicylate (PEPTO-BISMOL PO), Aspirin-Salicylamide-Caffeine (BC HEADACHE POWDER PO), multivitamin with minerals, thiamine, pantoprazole, and bictegravir-emtricitabine-tenofovir AF.   Meds ordered this encounter  Medications  . bictegravir-emtricitabine-tenofovir AF (BIKTARVY) 50-200-25 MG TABS tablet    Sig: Take 1 tablet by mouth daily.    Dispense:  30 tablet    Refill:  2    Order Specific Question:   Supervising Provider    Answer:   Judyann Munson [[id number]]     Follow-up: Return in about 3 months (around 02/14/2020).   Marcos Eke, MSN, FNP-C Nurse Practitioner [facility] for Infectious Disease [facility] Main number: [phone]

## 2019-11-16 LAB — T-HELPER CELL (CD4) - (RCID CLINIC ONLY)
CD4 % Helper T Cell: 64 % (ref 33–65)
CD4 T Cell Abs: 1163 /uL (ref 400–1790)

## 2019-11-17 ENCOUNTER — Inpatient Hospital Stay: Payer: Self-pay | Admitting: Internal Medicine

## 2019-11-18 LAB — HIV-1 RNA QUANT-NO REFLEX-BLD
HIV 1 RNA Quant: 47 Copies/mL — ABNORMAL HIGH
HIV-1 RNA Quant, Log: 1.67 Log cps/mL — ABNORMAL HIGH

## 2019-11-29 ENCOUNTER — Telehealth: Payer: Self-pay | Admitting: Internal Medicine

## 2019-11-29 DIAGNOSIS — K7031 Alcoholic cirrhosis of liver with ascites: Secondary | ICD-10-CM

## 2019-11-29 NOTE — Telephone Encounter (Signed)
Please contact pt and see what he is needing a radiology referral for.

## 2019-11-30 NOTE — Telephone Encounter (Signed)
Pt has been schedule for lab and paracentesis

## 2019-12-05 ENCOUNTER — Ambulatory Visit: Payer: Self-pay | Attending: Internal Medicine

## 2019-12-05 ENCOUNTER — Other Ambulatory Visit: Payer: Self-pay

## 2019-12-05 DIAGNOSIS — K7031 Alcoholic cirrhosis of liver with ascites: Secondary | ICD-10-CM

## 2019-12-06 ENCOUNTER — Other Ambulatory Visit: Payer: Self-pay | Admitting: Internal Medicine

## 2019-12-06 ENCOUNTER — Ambulatory Visit (HOSPITAL_COMMUNITY)
Admission: RE | Admit: 2019-12-06 | Discharge: 2019-12-06 | Disposition: A | Discharge status: Home / Self Care | Payer: Self-pay | Source: Ambulatory Visit | Attending: Internal Medicine | Admitting: Internal Medicine

## 2019-12-06 DIAGNOSIS — K7031 Alcoholic cirrhosis of liver with ascites: Secondary | ICD-10-CM

## 2019-12-06 LAB — PROTIME-INR
INR: 1.1 (ref 0.9–1.2)
Prothrombin Time: 11.5 s (ref 9.1–12.0)

## 2019-12-06 LAB — APTT: aPTT: 33 s (ref 24–33)

## 2019-12-06 MED FILL — TRAZODONE HCL 100 MG TABS: 100 | 30 days supply | Qty: 45 | Fill #1

## 2019-12-20 ENCOUNTER — Other Ambulatory Visit: Payer: Self-pay | Admitting: Internal Medicine

## 2019-12-20 DIAGNOSIS — R3911 Hesitancy of micturition: Secondary | ICD-10-CM

## 2019-12-20 MED FILL — FUROSEMIDE 20 MG TABS: 20 | 30 days supply | Qty: 30 | Fill #3

## 2019-12-20 MED FILL — PANTOPRAZOLE SOD DR 40 MG T: 40 | 30 days supply | Qty: 30 | Fill #0

## 2019-12-20 MED FILL — TAMSULOSIN HCL 0.4 MG CAP: 0.4 | 30 days supply | Qty: 30 | Fill #0

## 2019-12-20 MED FILL — SPIRONOLACTONE 50 MG TABS: 50 | 30 days supply | Qty: 30 | Fill #3

## 2019-12-20 NOTE — Telephone Encounter (Signed)
Requested Prescriptions  Pending Prescriptions Disp Refills  . pantoprazole (PROTONIX) 40 MG tablet [Pharmacy Med Name: PANTOPRAZOLE SOD DR 40 MG T 40 Tablet] 90 tablet 0    Sig: TAKE 1 TABLET (40 MG TOTAL) BY MOUTH DAILY.     Gastroenterology: Proton Pump Inhibitors Passed - 12/20/2019  9:33 AM      Passed - Valid encounter within last 12 months    Recent Outpatient Visits          1 month ago Hospital discharge follow-up   [facility] [name] B, MD   2 months ago Alcoholic cirrhosis of liver with ascites [facility])   [facility] [name], MD   4 months ago Ascites of liver   [facility] [name], MD   5 months ago Ascites of liver   [facility] [name], MD   5 months ago Appointment canceled by hospital   [facility] [name], MD      Future Appointments            In 3 days [name], MD [facility]   In 1 month [place], Tama Headings, FNP [facility] for [facility]ious Disease, [facility]           . tamsulosin (FLOMAX) 0.4 MG CAPS capsule [Pharmacy Med Name: TAMSULOSIN HCL 0.4 MG CAP 0.4 Capsule] 90 capsule 0    Sig: TAKE 1 CAPSULE (0.4 MG TOTAL) BY MOUTH DAILY.     Urology: Alpha-Adrenergic Blocker Passed - 12/20/2019  9:33 AM      Passed - Last BP in normal range    BP Readings from Last 1 Encounters:  11/15/19 126/82         Passed - Valid encounter within last 12 months    Recent Outpatient Visits          1 month ago Hospital discharge follow-up   [facility] [name] B, MD   2 months ago Alcoholic cirrhosis of liver with ascites [facility])   [facility] [name], MD   4 months ago Ascites of liver   [facility] [name], MD   5 months ago Ascites of liver   [facility] [name], MD   5 months ago Appointment canceled by hospital   [facility] [name], MD      Future Appointments            In 3 days [name], MD [facility]   In 1 month [place], Tama Headings, FNP [facility] for Infectious Disease, [facility]

## 2019-12-23 ENCOUNTER — Ambulatory Visit: Payer: Self-pay | Admitting: Internal Medicine

## 2019-12-26 ENCOUNTER — Ambulatory Visit: Payer: MEDICAID | Attending: Internal Medicine | Admitting: Internal Medicine

## 2019-12-26 ENCOUNTER — Other Ambulatory Visit: Payer: Self-pay

## 2019-12-26 DIAGNOSIS — Z2821 Immunization not carried out because of patient refusal: Secondary | ICD-10-CM

## 2019-12-26 DIAGNOSIS — F5104 Psychophysiologic insomnia: Secondary | ICD-10-CM

## 2019-12-26 DIAGNOSIS — K7031 Alcoholic cirrhosis of liver with ascites: Secondary | ICD-10-CM

## 2019-12-26 NOTE — Progress Notes (Signed)
Virtual Visit via Telephone Note Due to current restrictions/limitations of in-office visits due to the COVID-19 pandemic, this scheduled clinical appointment was converted to a telehealth visit  I connected with Thomas Atkins on 12/26/19 at 10:04 a.m by telephone and verified that I am speaking with the correct person using two identifiers. I am in my office.  The patient is at home.  Only the patient, CMA Julius Bowels and myself participated in this encounter.  I discussed the limitations, risks, security and privacy concerns of performing an evaluation and management service by telephone and the availability of in person appointments. I also discussed with the patient that there may be a patient responsible charge related to this service. The patient expressed understanding and agreed to proceed.   History of Present Illness: Patient with history ofHTN,HIV,alcohol use disorder, alcoholic cirrhosis with ascites, insomnia,tob dep, anx/dep, ?seizure disorder. Last seen 10/2019.  He had rescheduled appt from last wk.  Pt requesting higher dose of Trazodone to help him sleep better.  On 150 mg daily but some nights, he has to take 200 mg.  Finds that when he takes the 200 mg he is able to get 2-3 more hours of restful sleep.  Denies any daytime drowsiness or other significant side effects.  Pt denied Medicaid; was approved only for his HIV meds.  Spoke with a Clinical research associate and was told that he needs to apply for SSI and be denied to get Medicaid.  In the mean time he plans to apply for [facility] discount.  Cirrhosis:  Taking Spironolactone and Lasix.  No LE edema.  Reports early satiety. Went for paracentesis the end of last mth but again US showed only scant ascites.  HM: declines flu shot.  Completed COVID vaccine Outpatient Encounter Medications as of 12/26/2019  Medication Sig  . Aspirin-Salicylamide-Caffeine (BC HEADACHE POWDER PO) Take 1 packet by mouth 2 (two) times daily as needed (pain/headache).   . bictegravir-emtricitabine-tenofovir AF (BIKTARVY) 50-200-25 MG TABS tablet Take 1 tablet by mouth daily.  . Bismuth Subsalicylate (PEPTO-BISMOL PO) Take 1 tablet by mouth daily as needed (heartburn/indigestion).  . furosemide (LASIX) 20 MG tablet Take 1 tablet (20 mg total) by mouth daily. (Patient taking differently: Take 20 mg by mouth daily at 12 noon. )  . Multiple Vitamin (MULTIVITAMIN WITH MINERALS) TABS tablet Take 1 tablet by mouth daily.  . pantoprazole (PROTONIX) 40 MG tablet TAKE 1 TABLET (40 MG TOTAL) BY MOUTH DAILY.  Marland Kitchen Sennosides (EX-LAX PO) Take 1 tablet by mouth daily as needed (constipation).  Marland Kitchen spironolactone (ALDACTONE) 50 MG tablet Take 1 tablet (50 mg total) by mouth daily. (Patient taking differently: Take 50 mg by mouth daily at 12 noon. )  . tamsulosin (FLOMAX) 0.4 MG CAPS capsule TAKE 1 CAPSULE (0.4 MG TOTAL) BY MOUTH DAILY.  Marland Kitchen thiamine 100 MG tablet Take 1 tablet (100 mg total) by mouth daily.  . traZODone (DESYREL) 100 MG tablet Take 1.5 tablets (150 mg total) by mouth at bedtime as needed for sleep. (Patient taking differently: Take 200 mg by mouth at bedtime. )   No facility-administered encounter medications on file as of 12/26/2019.      Observations/Objective: Results for orders placed or performed in visit on 12/05/19  APTT  Result Value Ref Range   aPTT 33 24 - 33 sec  Protime-INR  Result Value Ref Range   INR 1.1 0.9 - 1.2   Prothrombin Time 11.5 9.1 - 12.0 sec     Assessment and Plan: 1. Alcoholic  cirrhosis of liver with ascites (HCC) Advised patient to remain free of alcohol. I will have our caseworker refer him to legal aid to see if they can assist with him doing an appeal on his Medicaid denial  2. Chronic insomnia Will increase trazodone to 200 mg but advised patient that according to UpToDate, we need to be cautious.  We should not increase any further.  3. Influenza vaccination declined   Follow Up Instructions: 3 mths   I  discussed the assessment and treatment plan with the patient. The patient was provided an opportunity to ask questions and all were answered. The patient agreed with the plan and demonstrated an understanding of the instructions.   The patient was advised to call back or seek an in-person evaluation if the symptoms worsen or if the condition fails to improve as anticipated.  I provided 16 minutes of non-face-to-face time during this encounter.   Thomas Blue, MD

## 2020-01-04 ENCOUNTER — Other Ambulatory Visit: Payer: Self-pay | Admitting: Internal Medicine

## 2020-01-04 DIAGNOSIS — F10982 Alcohol use, unspecified with alcohol-induced sleep disorder: Secondary | ICD-10-CM

## 2020-01-04 MED ORDER — TRAZODONE HCL 100 MG PO TABS: 150.0000 mg | ORAL_TABLET | Freq: Every evening | ORAL | 2 refills | Status: DC | PRN

## 2020-01-04 MED FILL — TRAZODONE HCL 100 MG TABS: 100 | 30 days supply | Qty: 45 | Fill #2

## 2020-01-04 NOTE — Telephone Encounter (Signed)
Medication Refill - Medication: traZODone (DESYREL) 100 MG tablet     Preferred Pharmacy (with phone number or street name):  [facility] [place], [place] - [address] Phone:  [phone]  Fax:  [phone]       Agent: Please be advised that RX refills may take up to 3 business days. We ask that you follow-up with your pharmacy.

## 2020-01-26 MED FILL — FUROSEMIDE 20 MG TABS: 20 | 30 days supply | Qty: 30 | Fill #4

## 2020-01-26 MED FILL — SPIRONOLACTONE 50 MG TABS: 50 | 30 days supply | Qty: 30 | Fill #4

## 2020-01-26 MED FILL — TAMSULOSIN HCL 0.4 MG CAP: 0.4 | 30 days supply | Qty: 30 | Fill #1

## 2020-01-26 MED FILL — PANTOPRAZOLE SOD DR 40 MG T: 40 | 30 days supply | Qty: 30 | Fill #1

## 2020-02-07 MED FILL — TRAZODONE HCL 100 MG TABS: 100 | 30 days supply | Qty: 45 | Fill #3

## 2020-02-14 ENCOUNTER — Ambulatory Visit (INDEPENDENT_AMBULATORY_CARE_PROVIDER_SITE_OTHER): Payer: Self-pay | Admitting: Family

## 2020-02-14 ENCOUNTER — Encounter: Payer: Self-pay | Admitting: Family

## 2020-02-14 ENCOUNTER — Other Ambulatory Visit: Payer: Self-pay

## 2020-02-14 VITALS — BP 121/77 | HR 76 | Temp 98.2°F | Wt 196.0 lb

## 2020-02-14 DIAGNOSIS — Z113 Encounter for screening for infections with a predominantly sexual mode of transmission: Secondary | ICD-10-CM

## 2020-02-14 DIAGNOSIS — B20 Human immunodeficiency virus [HIV] disease: Secondary | ICD-10-CM

## 2020-02-14 DIAGNOSIS — Z0000 Encounter for general adult medical examination without abnormal findings: Secondary | ICD-10-CM

## 2020-02-14 MED ORDER — BICTEGRAVIR-EMTRICITAB-TENOFOV 50-200-25 MG PO TABS: 1.0000 | ORAL_TABLET | Freq: Every day | ORAL | 5 refills | Status: AC

## 2020-02-14 NOTE — Assessment & Plan Note (Addendum)
   Declines influenza vaccine.  Discussed importance of safe sexual practice to reduce risk of STI.  Condoms declined.  Due for routine dental care.  Will place referral to [facility].

## 2020-02-14 NOTE — Patient Instructions (Addendum)
Nice to see you.  We will continue your current dose of Biktarvy.  We will check your lab work today.   Refills have been sent to the pharmacy.  Plan for follow up in 3 months or sooner if needed.  Will need to renew your [employer] after January 1st.   Have a great day and stay safe!

## 2020-02-14 NOTE — Progress Notes (Signed)
Subjective:    Patient ID: Thomas Atkins, male    DOB: [birth year], 44 y.o.   MRN: [id number]  Chief Complaint  Patient presents with  . Follow-up    requesting albs     HPI:  Thomas Atkins is a 44 y.o. male with HIV disease and alcoholic cirrhosis of the liver who was last seen on 11/15/2019 with good adherence and tolerance to his ART regimen of Biktarvy.  Viral load at the time was undetectable with CD4 count of 1163.  Here today for routine follow-up.  Mr. Burnsworth continues to take his Biktarvy daily as prescribed with no adverse side effects or missed doses since his last office visit.  He is feeling okay today but feels like he has fluid present. Denies fevers, chills, night sweats, headaches, changes in vision, neck pain/stiffness, nausea, diarrhea, vomiting, lesions or rashes.  Mr. Elnora Morrison has no problems obtaining his medication from the pharmacy and remains covered through [employer].  Denies feelings of being down, depressed, or hopeless recently.  No recreational or illicit drug use, tobacco use, and continues to drink alcohol.  Declines vaccines although is interested in receiving his booster vaccine for Covid.  Declines condoms.  Working on getting Medicaid and possibly disability.  No problems [employer] renewed up to date;    Allergies  Allergen Reactions  . Bee Venom Anaphylaxis  . Lactose Intolerance (Gi) Diarrhea, Nausea Only and Other (See Comments)    Abdominal pain    . Ultram [Tramadol Hcl] Hives           Outpatient Medications Prior to Visit  Medication Sig Dispense Refill  . Aspirin-Salicylamide-Caffeine (BC HEADACHE POWDER PO) Take 1 packet by mouth 2 (two) times daily as needed (pain/headache).    . furosemide (LASIX) 20 MG tablet Take 1 tablet (20 mg total) by mouth daily. (Patient taking differently: Take 20 mg by mouth daily at 12 noon. ) 30 tablet 4  . Multiple Vitamin (MULTIVITAMIN WITH MINERALS) TABS tablet Take 1 tablet by mouth daily. 30 tablet 0  .  pantoprazole (PROTONIX) 40 MG tablet TAKE 1 TABLET (40 MG TOTAL) BY MOUTH DAILY. 90 tablet 0  . tamsulosin (FLOMAX) 0.4 MG CAPS capsule TAKE 1 CAPSULE (0.4 MG TOTAL) BY MOUTH DAILY. 90 capsule 0  . traZODone (DESYREL) 100 MG tablet Take 1.5 tablets (150 mg total) by mouth at bedtime as needed for sleep. 45 tablet 2  . bictegravir-emtricitabine-tenofovir AF (BIKTARVY) 50-200-25 MG TABS tablet Take 1 tablet by mouth daily. 30 tablet 2  . Bismuth Subsalicylate (PEPTO-BISMOL PO) Take 1 tablet by mouth daily as needed (heartburn/indigestion). (Patient not taking: Reported on 02/14/2020)    . Sennosides (EX-LAX PO) Take 1 tablet by mouth daily as needed (constipation). (Patient not taking: Reported on 02/14/2020)    . spironolactone (ALDACTONE) 50 MG tablet Take 1 tablet (50 mg total) by mouth daily. (Patient not taking: Reported on 02/14/2020) 30 tablet 4  . thiamine 100 MG tablet Take 1 tablet (100 mg total) by mouth daily. (Patient not taking: Reported on 02/14/2020) 30 tablet 0   No facility-administered medications prior to visit.     Past Medical History:  Diagnosis Date  . Alcoholism (HCC)   . Anxiety   . Depression   . HIV infection (HCC)   . Seizure disorder (HCC)   . Syncope      Past Surgical History:  Procedure Laterality Date  . IR PARACENTESIS  07/01/2019  . IR PARACENTESIS  07/15/2019  . IR  PARACENTESIS  07/27/2019  . IR PARACENTESIS  08/03/2019  . IR PARACENTESIS  08/10/2019  . IR PARACENTESIS  08/24/2019  . SURGERY SCROTAL / TESTICULAR         Review of Systems  Constitutional: Negative for appetite change, chills, fatigue, fever and unexpected weight change.  Eyes: Negative for visual disturbance.  Respiratory: Negative for cough, chest tightness, shortness of breath and wheezing.   Cardiovascular: Negative for chest pain and leg swelling.  Gastrointestinal: Negative for abdominal pain, constipation, diarrhea, nausea and vomiting.  Genitourinary: Negative for dysuria,  flank pain, frequency, genital sores, hematuria and urgency.  Skin: Negative for rash.  Allergic/Immunologic: Negative for immunocompromised state.  Neurological: Negative for dizziness and headaches.      Objective:    BP 121/77   Pulse 76   Temp 98.2 F (36.8 C) (Oral)   Wt 196 lb (88.9 kg)   BMI 25.86 kg/m  Nursing note and vital signs reviewed.  Physical Exam Constitutional:      General: He is not in acute distress.    Appearance: He is well-developed.  Eyes:     Conjunctiva/sclera: Conjunctivae normal.  Cardiovascular:     Rate and Rhythm: Normal rate and regular rhythm.     Heart sounds: Normal heart sounds. No murmur heard.  No friction rub. No gallop.   Pulmonary:     Effort: Pulmonary effort is normal. No respiratory distress.     Breath sounds: Normal breath sounds. No wheezing or rales.  Chest:     Chest wall: No tenderness.  Abdominal:     General: Bowel sounds are normal.     Palpations: Abdomen is soft.     Tenderness: There is no abdominal tenderness.  Musculoskeletal:     Cervical back: Neck supple.  Lymphadenopathy:     Cervical: No cervical adenopathy.  Skin:    General: Skin is warm and dry.     Findings: No rash.  Neurological:     Mental Status: He is alert and oriented to person, place, and time.  Psychiatric:        Behavior: Behavior normal.        Thought Content: Thought content normal.        Judgment: Judgment normal.      Depression screen [facility] 2/9 11/15/2019 10/24/2019 08/09/2019 08/03/2019 07/22/2019  Decreased Interest 1 1 3  0 3  Down, Depressed, Hopeless 1 1 3 1 3   PHQ - 2 Score 2 2 6 1 6   Altered sleeping - 1 2 - 3  Tired, decreased energy - 0 3 - 3  Change in appetite - 1 2 - 0  Feeling bad or failure about yourself  - 1 0 - 0  Trouble concentrating - 1 1 - 1  Moving slowly or fidgety/restless - 0 0 - 3  Suicidal thoughts - 0 0 - 3  PHQ-9 Score - 6 14 - 19       Assessment & Plan:    Patient Active Problem List    Diagnosis Date Noted  . GERD without esophagitis 10/11/2019  . BPH without obstruction/lower urinary tract symptoms 10/11/2019  . Abdominal pain 08/27/2019  . Alcoholic cirrhosis of liver with ascites (HCC) 08/27/2019  . Healthcare maintenance 08/03/2019  . Rash and nonspecific skin eruption 07/22/2019  . Constipation 06/16/2019  . HIV (human immunodeficiency virus infection) (HCC) 06/16/2019  . GI bleed 05/26/2019  . History of hypertension 07/31/2017  . Alcohol use disorder, moderate, dependence (HCC) 07/31/2017  . Nicotine  dependence, cigarettes, uncomplicated 07/31/2017  . Alcohol-induced insomnia (HCC) 07/31/2017  . Major depressive disorder, recurrent severe without psychotic features (HCC) 10/10/2015  . Alcohol abuse with alcohol-induced mood disorder (HCC) 05/31/2015  . Alcohol abuse 02/16/2013     Problem List Items Addressed This Visit      Other   HIV (human immunodeficiency virus infection) (HCC) - Primary    Mr. Spagnolo continues to have well-controlled HIV disease with good adherence and tolerance to his ART regimen of Biktarvy.  No signs/symptoms of opportunistic infection or progressive HIV disease.  Reviewed previous lab work and discussed plan of care.  Check blood work today.  Continue current dose of Biktarvy.  Plan for follow-up in 3 months or sooner if needed.  Will need to renew financial assistance after January 1.      Relevant Medications   bictegravir-emtricitabine-tenofovir AF (BIKTARVY) 50-200-25 MG TABS tablet   Other Relevant Orders   COMPLETE METABOLIC PANEL WITH GFR   HIV-1 RNA quant-no reflex-bld   T-helper cell (CD4)- ([facility] clinic only)   CBC   Healthcare maintenance     Declines influenza vaccine.  Discussed importance of safe sexual practice to reduce risk of STI.  Condoms declined.  Due for routine dental care.  Will place referral to [facility].        Other Visit Diagnoses    Screening for STDs (sexually transmitted diseases)        Relevant Orders   RPR       I am having Biruk P. Barg maintain his spironolactone, furosemide, Sennosides (EX-LAX PO), Bismuth Subsalicylate (PEPTO-BISMOL PO), Aspirin-Salicylamide-Caffeine (BC HEADACHE POWDER PO), multivitamin with minerals, thiamine, pantoprazole, tamsulosin, traZODone, and bictegravir-emtricitabine-tenofovir AF.   Meds ordered this encounter  Medications  . bictegravir-emtricitabine-tenofovir AF (BIKTARVY) 50-200-25 MG TABS tablet    Sig: Take 1 tablet by mouth daily.    Dispense:  30 tablet    Refill:  5    Order Specific Question:   Supervising Provider    Answer:   Judyann Munson [[id number]]     Follow-up: Return in about 3 months (around 05/14/2020), or if symptoms worsen or fail to improve.   Marcos Eke, MSN, FNP-C Nurse Practitioner [facility] Main number: [phone]

## 2020-02-14 NOTE — Assessment & Plan Note (Signed)
Thomas Atkins continues to have well-controlled HIV disease with good adherence and tolerance to his ART regimen of Biktarvy.  No signs/symptoms of opportunistic infection or progressive HIV disease.  Reviewed previous lab work and discussed plan of care.  Check blood work today.  Continue current dose of Biktarvy.  Plan for follow-up in 3 months or sooner if needed.  Will need to renew financial assistance after January 1.

## 2020-02-15 LAB — T-HELPER CELL (CD4) - (RCID CLINIC ONLY)
CD4 % Helper T Cell: 58 % (ref 33–65)
CD4 T Cell Abs: 1145 /uL (ref 400–1790)

## 2020-02-16 ENCOUNTER — Telehealth: Payer: Self-pay

## 2020-02-16 ENCOUNTER — Ambulatory Visit: Payer: Self-pay | Admitting: Internal Medicine

## 2020-02-16 LAB — COMPLETE METABOLIC PANEL WITH GFR
AG Ratio: 1.1 (calc) (ref 1.0–2.5)
ALT: 21 U/L (ref 9–46)
AST: 42 U/L — ABNORMAL HIGH (ref 10–40)
Albumin: 4.5 g/dL (ref 3.6–5.1)
Alkaline phosphatase (APISO): 176 U/L — ABNORMAL HIGH (ref 36–130)
BUN: 8 mg/dL (ref 7–25)
CO2: 24 mmol/L (ref 20–32)
Calcium: 9.8 mg/dL (ref 8.6–10.3)
Chloride: 103 mmol/L (ref 98–110)
Creat: 1 mg/dL (ref 0.60–1.35)
GFR, Est African American: 104 mL/min/{1.73_m2} (ref >=60)
GFR, Est Non African American: 90 mL/min/{1.73_m2} (ref >=60)
Globulin: 4.2 g/dL (calc) — ABNORMAL HIGH (ref 1.9–3.7)
Glucose, Bld: 88 mg/dL (ref 65–99)
Potassium: 4.2 mmol/L (ref 3.5–5.3)
Sodium: 137 mmol/L (ref 135–146)
Total Bilirubin: 1.2 mg/dL (ref 0.2–1.2)
Total Protein: 8.7 g/dL — ABNORMAL HIGH (ref 6.1–8.1)

## 2020-02-16 LAB — CBC
HCT: 32 % — ABNORMAL LOW (ref 38.5–50.0)
Hemoglobin: 10.3 g/dL — ABNORMAL LOW (ref 13.2–17.1)
MCH: 27.1 pg (ref 27.0–33.0)
MCHC: 32.2 g/dL (ref 32.0–36.0)
MCV: 84.2 fL (ref 80.0–100.0)
MPV: 9.3 fL (ref 7.5–12.5)
Platelets: 183 10*3/uL (ref 140–400)
RBC: 3.8 10*6/uL — ABNORMAL LOW (ref 4.20–5.80)
RDW: 15.7 % — ABNORMAL HIGH (ref 11.0–15.0)
WBC: 6.8 10*3/uL (ref 3.8–10.8)

## 2020-02-16 LAB — RPR: RPR Ser Ql: NONREACTIVE

## 2020-02-16 LAB — HIV-1 RNA QUANT-NO REFLEX-BLD
HIV 1 RNA Quant: 73 Copies/mL — ABNORMAL HIGH
HIV-1 RNA Quant, Log: 1.87 Log cps/mL — ABNORMAL HIGH

## 2020-02-16 NOTE — Telephone Encounter (Signed)
Spoke with the patient to relay per Marcos Eke, NP that his CD4 count is 1145 and he is fine to get his booster as planned. Patient verbalized understanding and has no further questions.   Sandie Ano, RN

## 2020-02-16 NOTE — Telephone Encounter (Signed)
-----   Message from Veryl Speak, FNP sent at 02/16/2020  9:07 AM EST ----- Please inform Mr. Mankins that his CD4 count is 1145 which means he is fine to get his booster as he planned.

## 2020-03-06 ENCOUNTER — Other Ambulatory Visit: Payer: Self-pay | Admitting: Internal Medicine

## 2020-03-06 DIAGNOSIS — R188 Other ascites: Secondary | ICD-10-CM

## 2020-03-06 MED FILL — TAMSULOSIN HCL 0.4 MG CAP: 0.4 | 30 days supply | Qty: 30 | Fill #2

## 2020-03-06 MED FILL — SPIRONOLACTONE 50 MG TABS: 50 | 30 days supply | Qty: 30 | Fill #0

## 2020-03-06 MED FILL — FUROSEMIDE 20 MG TABS: 20 | 30 days supply | Qty: 30 | Fill #0

## 2020-03-06 MED FILL — PANTOPRAZOLE SOD DR 40 MG T: 40 | 30 days supply | Qty: 30 | Fill #2

## 2020-03-06 NOTE — Telephone Encounter (Signed)
Requested medication (s) are due for refill today:yes  Requested medication (s) are on the active medication list:yes  Last refill:  reported not taking 02/14/20  Future visit scheduled: No  Notes to clinic:  Note states patient reported not taking. OV with infectious Dx  states to continue. Please review    Requested Prescriptions  Pending Prescriptions Disp Refills   spironolactone (ALDACTONE) 50 MG tablet [Pharmacy Med Name: SPIRONOLACTONE 50 MG TABS 50 Tablet] 30 tablet 4    Sig: TAKE 1 TABLET (50 MG TOTAL) BY MOUTH DAILY.      Cardiovascular: Diuretics - Aldosterone Antagonist Passed - 03/06/2020 11:29 AM      Passed - Cr in normal range and within 360 days    Creat  Date Value Ref Range Status  02/14/2020 1.00 0.60 - 1.35 mg/dL Final          Passed - K in normal range and within 360 days    Potassium  Date Value Ref Range Status  02/14/2020 4.2 3.5 - 5.3 mmol/L Final          Passed - Na in normal range and within 360 days    Sodium  Date Value Ref Range Status  02/14/2020 137 135 - 146 mmol/L Final  07/22/2019 134 134 - 144 mmol/L Final          Passed - Last BP in normal range    BP Readings from Last 1 Encounters:  02/14/20 121/77          Passed - Valid encounter within last 6 months    Recent Outpatient Visits           2 months ago Alcoholic cirrhosis of liver with ascites [facility])   [place] [facility] [name], MD   4 months ago Hospital discharge follow-up   [facility] [name] B, MD   5 months ago Alcoholic cirrhosis of liver with ascites [facility])   [place] [facility] [name], MD   7 months ago Ascites of liver   [facility] [name], MD   7 months ago Ascites of liver   [facility] [name], MD       Future Appointments             In 2 months Carver Fila,  Tama Headings, FNP [facility] for [facility], [facility]              Signed Prescriptions Disp Refills   furosemide (LASIX) 20 MG tablet 90 tablet 1    Sig: TAKE 1 TABLET (20 MG TOTAL) BY MOUTH DAILY.      Cardiovascular:  Diuretics - Loop Passed - 03/06/2020 11:29 AM      Passed - K in normal range and within 360 days    Potassium  Date Value Ref Range Status  02/14/2020 4.2 3.5 - 5.3 mmol/L Final          Passed - Ca in normal range and within 360 days    Calcium  Date Value Ref Range Status  02/14/2020 9.8 8.6 - 10.3 mg/dL Final   Calcium, Ion  Date Value Ref Range Status  10/11/2019 1.20 1.15 - 1.40 mmol/L Final          Passed - Na in normal range and within 360 days    Sodium  Date Value Ref Range Status  02/14/2020 137 135 - 146 mmol/L Final  07/22/2019 134 134 - 144 mmol/L Final          Passed - Cr in normal range and within 360 days    Creat  Date Value Ref Range Status  02/14/2020 1.00 0.60 - 1.35 mg/dL Final          Passed - Last BP in normal range    BP Readings from Last 1 Encounters:  02/14/20 121/77          Passed - Valid encounter within last 6 months    Recent Outpatient Visits           2 months ago Alcoholic cirrhosis of liver with ascites [facility])   [place] [facility] [name], MD   4 months ago Hospital discharge follow-up   [facility] [name], MD   5 months ago Alcoholic cirrhosis of liver with ascites [facility])   [facility] [name], MD   7 months ago Ascites of liver   [facility] [name], MD   7 months ago Ascites of liver   [facility] [name], MD       Future Appointments             In 2 months Calone, Tama Headings, FNP [facility] for [facility]ious Disease, [facility]

## 2020-03-06 NOTE — Telephone Encounter (Signed)
Requested Prescriptions  Pending Prescriptions Disp Refills  . spironolactone (ALDACTONE) 50 MG tablet [Pharmacy Med Name: SPIRONOLACTONE 50 MG TABS 50 Tablet] 30 tablet 4    Sig: TAKE 1 TABLET (50 MG TOTAL) BY MOUTH DAILY.     Cardiovascular: Diuretics - Aldosterone Antagonist Passed - 03/06/2020 11:29 AM      Passed - Cr in normal range and within 360 days    Creat  Date Value Ref Range Status  02/14/2020 1.00 0.60 - 1.35 mg/dL Final         Passed - K in normal range and within 360 days    Potassium  Date Value Ref Range Status  02/14/2020 4.2 3.5 - 5.3 mmol/L Final         Passed - Na in normal range and within 360 days    Sodium  Date Value Ref Range Status  02/14/2020 137 135 - 146 mmol/L Final  07/22/2019 134 134 - 144 mmol/L Final         Passed - Last BP in normal range    BP Readings from Last 1 Encounters:  02/14/20 121/77         Passed - Valid encounter within last 6 months    Recent Outpatient Visits          2 months ago Alcoholic cirrhosis of liver with ascites [facility])   [place] [facility] [name], MD   4 months ago Hospital discharge follow-up   [facility] [name], MD   5 months ago Alcoholic cirrhosis of liver with ascites [facility])   [facility] [name], MD   7 months ago Ascites of liver   [facility] [name], MD   7 months ago Ascites of liver   [facility] [name], MD      Future Appointments            In 2 months Calone, Tama Headings, FNP [facility] for Infectious Disease, [facility]           . furosemide (LASIX) 20 MG tablet [Pharmacy Med Name: FUROSEMIDE 20 MG TABS 20 Tablet] 90 tablet 1    Sig: TAKE 1 TABLET (20 MG TOTAL) BY MOUTH DAILY.     Cardiovascular:  Diuretics - Loop Passed - 03/06/2020 11:29 AM      Passed - K in normal range  and within 360 days    Potassium  Date Value Ref Range Status  02/14/2020 4.2 3.5 - 5.3 mmol/L Final         Passed - Ca in normal range and within 360 days    Calcium  Date Value Ref Range Status  02/14/2020 9.8 8.6 - 10.3 mg/dL Final   Calcium, Ion  Date Value Ref Range Status  10/11/2019 1.20 1.15 - 1.40 mmol/L Final         Passed - Na in normal range and within 360 days    Sodium  Date Value Ref Range Status  02/14/2020 137 135 - 146 mmol/L Final  07/22/2019 134 134 - 144 mmol/L Final         Passed - Cr in normal range and within 360 days    Creat  Date Value Ref Range Status  02/14/2020 1.00 0.60 - 1.35 mg/dL Final         Passed - Last BP in normal  range    BP Readings from Last 1 Encounters:  02/14/20 121/77         Passed - Valid encounter within last 6 months    Recent Outpatient Visits          2 months ago Alcoholic cirrhosis of liver with ascites [facility])   [facility] [name], MD   4 months ago Hospital discharge follow-up   [facility] [name], MD   5 months ago Alcoholic cirrhosis of liver with ascites [facility])   [place] [facility] [name], MD   7 months ago Ascites of liver   [facility] [name], MD   7 months ago Ascites of liver   [facility] [name], MD      Future Appointments            In 2 months Calone, Tama Headings, FNP [facility] for [facility], [facility]

## 2020-03-14 MED FILL — TRAZODONE HCL 100 MG TABS: 100 | 30 days supply | Qty: 45 | Fill #4

## 2020-03-21 ENCOUNTER — Ambulatory Visit: Payer: Self-pay

## 2020-03-21 ENCOUNTER — Other Ambulatory Visit: Payer: Self-pay

## 2020-03-23 ENCOUNTER — Encounter: Payer: Self-pay | Admitting: Family

## 2020-03-30 ENCOUNTER — Other Ambulatory Visit: Payer: Self-pay

## 2020-03-30 ENCOUNTER — Inpatient Hospital Stay (HOSPITAL_COMMUNITY)
Admission: EM | Admit: 2020-03-30 | Discharge: 2020-04-03 | DRG: 377 | Disposition: A | Discharge status: Home / Self Care | Payer: Self-pay | Attending: Family Medicine | Admitting: Family Medicine

## 2020-03-30 ENCOUNTER — Encounter (HOSPITAL_COMMUNITY): Payer: Self-pay

## 2020-03-30 ENCOUNTER — Emergency Department (HOSPITAL_COMMUNITY): Payer: Self-pay

## 2020-03-30 DIAGNOSIS — Z7982 Long term (current) use of aspirin: Secondary | ICD-10-CM

## 2020-03-30 DIAGNOSIS — I8511 Secondary esophageal varices with bleeding: Secondary | ICD-10-CM | POA: Diagnosis present

## 2020-03-30 DIAGNOSIS — R109 Unspecified abdominal pain: Secondary | ICD-10-CM | POA: Diagnosis present

## 2020-03-30 DIAGNOSIS — Z20822 Contact with and (suspected) exposure to covid-19: Secondary | ICD-10-CM | POA: Diagnosis present

## 2020-03-30 DIAGNOSIS — K766 Portal hypertension: Secondary | ICD-10-CM | POA: Diagnosis present

## 2020-03-30 DIAGNOSIS — Z91030 Bee allergy status: Secondary | ICD-10-CM

## 2020-03-30 DIAGNOSIS — R3915 Urgency of urination: Secondary | ICD-10-CM | POA: Diagnosis present

## 2020-03-30 DIAGNOSIS — Z818 Family history of other mental and behavioral disorders: Secondary | ICD-10-CM

## 2020-03-30 DIAGNOSIS — R1084 Generalized abdominal pain: Secondary | ICD-10-CM

## 2020-03-30 DIAGNOSIS — F1020 Alcohol dependence, uncomplicated: Secondary | ICD-10-CM | POA: Diagnosis present

## 2020-03-30 DIAGNOSIS — F419 Anxiety disorder, unspecified: Secondary | ICD-10-CM | POA: Diagnosis present

## 2020-03-30 DIAGNOSIS — I8500 Esophageal varices without bleeding: Secondary | ICD-10-CM

## 2020-03-30 DIAGNOSIS — K922 Gastrointestinal hemorrhage, unspecified: Secondary | ICD-10-CM

## 2020-03-30 DIAGNOSIS — Z888 Allergy status to other drugs, medicaments and biological substances status: Secondary | ICD-10-CM

## 2020-03-30 DIAGNOSIS — K2971 Gastritis, unspecified, with bleeding: Principal | ICD-10-CM | POA: Diagnosis present

## 2020-03-30 DIAGNOSIS — R188 Other ascites: Secondary | ICD-10-CM

## 2020-03-30 DIAGNOSIS — K7031 Alcoholic cirrhosis of liver with ascites: Secondary | ICD-10-CM | POA: Diagnosis present

## 2020-03-30 DIAGNOSIS — B20 Human immunodeficiency virus [HIV] disease: Secondary | ICD-10-CM | POA: Diagnosis present

## 2020-03-30 DIAGNOSIS — Z79899 Other long term (current) drug therapy: Secondary | ICD-10-CM

## 2020-03-30 DIAGNOSIS — D62 Acute posthemorrhagic anemia: Secondary | ICD-10-CM | POA: Diagnosis present

## 2020-03-30 DIAGNOSIS — R400 Somnolence: Secondary | ICD-10-CM | POA: Diagnosis present

## 2020-03-30 DIAGNOSIS — Z8249 Family history of ischemic heart disease and other diseases of the circulatory system: Secondary | ICD-10-CM

## 2020-03-30 DIAGNOSIS — K219 Gastro-esophageal reflux disease without esophagitis: Secondary | ICD-10-CM | POA: Diagnosis present

## 2020-03-30 DIAGNOSIS — F17210 Nicotine dependence, cigarettes, uncomplicated: Secondary | ICD-10-CM | POA: Diagnosis present

## 2020-03-30 DIAGNOSIS — G40909 Epilepsy, unspecified, not intractable, without status epilepticus: Secondary | ICD-10-CM | POA: Diagnosis present

## 2020-03-30 DIAGNOSIS — K3189 Other diseases of stomach and duodenum: Secondary | ICD-10-CM | POA: Diagnosis present

## 2020-03-30 DIAGNOSIS — Z21 Asymptomatic human immunodeficiency virus [HIV] infection status: Secondary | ICD-10-CM | POA: Diagnosis present

## 2020-03-30 DIAGNOSIS — D649 Anemia, unspecified: Secondary | ICD-10-CM

## 2020-03-30 LAB — COMPREHENSIVE METABOLIC PANEL
ALT: 23 U/L (ref 0–44)
AST: 38 U/L (ref 15–41)
Albumin: 4.1 g/dL (ref 3.5–5.0)
Alkaline Phosphatase: 157 U/L — ABNORMAL HIGH (ref 38–126)
Anion gap: 13 (ref 5–15)
BUN: 8 mg/dL (ref 6–20)
CO2: 20 mmol/L — ABNORMAL LOW (ref 22–32)
Calcium: 9.3 mg/dL (ref 8.9–10.3)
Chloride: 101 mmol/L (ref 98–111)
Creatinine, Ser: 0.9 mg/dL (ref 0.61–1.24)
GFR, Estimated: >60 mL/min (ref >60)
Glucose, Bld: 102 mg/dL — ABNORMAL HIGH (ref 70–99)
Potassium: 3.6 mmol/L (ref 3.5–5.1)
Sodium: 134 mmol/L — ABNORMAL LOW (ref 135–145)
Total Bilirubin: 1.4 mg/dL — ABNORMAL HIGH (ref 0.3–1.2)
Total Protein: 8.7 g/dL — ABNORMAL HIGH (ref 6.5–8.1)

## 2020-03-30 LAB — CBC WITH DIFFERENTIAL/PLATELET
Abs Immature Granulocytes: 0.03 10*3/uL (ref 0.00–0.07)
Basophils Absolute: 0.1 10*3/uL (ref 0.0–0.1)
Basophils Relative: 1 %
Eosinophils Absolute: 0.4 10*3/uL (ref 0.0–0.5)
Eosinophils Relative: 3 %
HCT: 24.7 % — ABNORMAL LOW (ref 39.0–52.0)
Hemoglobin: 7.5 g/dL — ABNORMAL LOW (ref 13.0–17.0)
Immature Granulocytes: 0 %
Lymphocytes Relative: 16 %
Lymphs Abs: 2.1 10*3/uL (ref 0.7–4.0)
MCH: 23.7 pg — ABNORMAL LOW (ref 26.0–34.0)
MCHC: 30.4 g/dL (ref 30.0–36.0)
MCV: 77.9 fL — ABNORMAL LOW (ref 80.0–100.0)
Monocytes Absolute: 1.1 10*3/uL — ABNORMAL HIGH (ref 0.1–1.0)
Monocytes Relative: 9 %
Neutro Abs: 8.9 10*3/uL — ABNORMAL HIGH (ref 1.7–7.7)
Neutrophils Relative %: 71 %
Platelets: 192 10*3/uL (ref 150–400)
RBC: 3.17 MIL/uL — ABNORMAL LOW (ref 4.22–5.81)
RDW: 18.3 % — ABNORMAL HIGH (ref 11.5–15.5)
WBC: 12.6 10*3/uL — ABNORMAL HIGH (ref 4.0–10.5)
nRBC: 0 % (ref 0.0–0.2)

## 2020-03-30 LAB — LIPASE, BLOOD: Lipase: 32 U/L (ref 11–51)

## 2020-03-30 LAB — POC OCCULT BLOOD, ED: Fecal Occult Bld: POSITIVE — AB

## 2020-03-30 MED ORDER — IOHEXOL 300 MG/ML  SOLN
100.0000 mL | Freq: Once | INTRAMUSCULAR | Status: AC | PRN
  Administered 2020-03-30: 100 mL via INTRAVENOUS

## 2020-03-30 MED ORDER — ONDANSETRON HCL 4 MG/2ML IJ SOLN
4.0000 mg | Freq: Once | INTRAMUSCULAR | Status: AC
  Administered 2020-03-30: 4 mg via INTRAVENOUS
  Filled 2020-03-30: qty 2

## 2020-03-30 MED ORDER — PANTOPRAZOLE SODIUM 40 MG IV SOLR
40.0000 mg | Freq: Once | INTRAVENOUS | Status: AC
  Administered 2020-03-30: 40 mg via INTRAVENOUS
  Filled 2020-03-30: qty 40

## 2020-03-30 MED ORDER — FENTANYL CITRATE (PF) 100 MCG/2ML IJ SOLN
50.0000 ug | Freq: Once | INTRAMUSCULAR | Status: AC
  Administered 2020-03-30: 50 ug via INTRAVENOUS
  Filled 2020-03-30: qty 2

## 2020-03-30 NOTE — ED Triage Notes (Signed)
Patient BIB [facility] from home. History of liver disease. Patient has had decreased urine output today. Nauseous and vomiting at home. Patient had 2 40oz beers today. Patient thinks his kidneys are failing.

## 2020-03-30 NOTE — ED Notes (Signed)
Patient transported to X-ray 

## 2020-03-30 NOTE — ED Notes (Signed)
Bladder scan results: 45 mL.

## 2020-03-30 NOTE — ED Provider Notes (Signed)
[facility]-EMERGENCY DEPT Provider Note   CSN: [id number] Arrival date & time: 03/30/20  2223     History Chief Complaint  Patient presents with  . Back Pain  . Emesis    Thomas Atkins is a 45 y.o. male.   45 y/o male with hx of HIV (CD4 count 1145), alcoholic cirrhosis w/ascites, anxiety, depression presents to the ED for c/o abdominal pain. States that symptoms began acutely a few hours ago. Pain has remained constant and is diffuse, radiating around to his b/l flanks. Continues to voice that his "kidneys hurt". Pain aggravated with movement and palpation. Symptoms associated with NB/NB emesis x 5. Also reporting urinary urgency with hesitancy; feels that his stream is just a "trickle". Has noted some black stools today. Drank a 24 ounce beer and a 40 ounce beer today. Last paracentesis was in 08/2019 which he attributes to compliance with Lasix, Aldactone. Does not feel that his abdomen is more distended than baseline or that he has been retaining abdominal fluid. Denies recent fever.   The history is provided by the patient. No language interpreter was used.       Past Medical History:  Diagnosis Date  . Alcoholism (HCC)   . Anxiety   . Depression   . HIV infection (HCC)   . Seizure disorder (HCC)   . Syncope     Patient Active Problem List   Diagnosis Date Noted  . GERD without esophagitis 10/11/2019  . BPH without obstruction/lower urinary tract symptoms 10/11/2019  . Abdominal pain 08/27/2019  . Alcoholic cirrhosis of liver with ascites (HCC) 08/27/2019  . Healthcare maintenance 08/03/2019  . Rash and nonspecific skin eruption 07/22/2019  . Constipation 06/16/2019  . HIV (human immunodeficiency virus infection) (HCC) 06/16/2019  . GI bleed 05/26/2019  . History of hypertension 07/31/2017  . Alcohol use disorder, moderate, dependence (HCC) 07/31/2017  . Nicotine dependence, cigarettes, uncomplicated 07/31/2017  . Alcohol-induced insomnia (HCC)  07/31/2017  . Major depressive disorder, recurrent severe without psychotic features (HCC) 10/10/2015  . Alcohol abuse with alcohol-induced mood disorder (HCC) 05/31/2015  . Alcohol abuse 02/16/2013    Past Surgical History:  Procedure Laterality Date  . IR PARACENTESIS  07/01/2019  . IR PARACENTESIS  07/15/2019  . IR PARACENTESIS  07/27/2019  . IR PARACENTESIS  08/03/2019  . IR PARACENTESIS  08/10/2019  . IR PARACENTESIS  08/24/2019  . SURGERY SCROTAL / TESTICULAR         Family History  Problem Relation Age of Onset  . Heart disease Paternal Grandmother   . Bipolar disorder Father     Social History   Tobacco Use  . Smoking status: Current Every Day Smoker    Packs/day: 1.00    Years: 30.00    Pack years: 30.00    Types: Cigarettes  . Smokeless tobacco: Never Used  . Tobacco comment: Patient refused cessation materials  Vaping Use  . Vaping Use: Never used  Substance Use Topics  . Alcohol use: Yes    Alcohol/week: 24.0 standard drinks    Types: 24 Cans of beer per week    Comment: 6 pack per day  . Drug use: Yes    Types: Marijuana, Cocaine    Comment: on occasion    Home Medications Prior to Admission medications   Medication Sig Start Date End Date Taking? Authorizing Provider  Aspirin-Salicylamide-Caffeine (BC HEADACHE POWDER PO) Take 1 packet by mouth 2 (two) times daily as needed (pain/headache).    [provider]  bictegravir-emtricitabine-tenofovir AF (BIKTARVY) 50-200-25 MG TABS tablet Take 1 tablet by mouth daily. 02/14/20   Veryl Speak, FNP  Bismuth Subsalicylate (PEPTO-BISMOL PO) Take 1 tablet by mouth daily as needed (heartburn/indigestion). Patient not taking: Reported on 02/14/2020    [provider]  furosemide (LASIX) 20 MG tablet TAKE 1 TABLET (20 MG TOTAL) BY MOUTH DAILY. 03/06/20   [name], MD  Multiple Vitamin (MULTIVITAMIN WITH MINERALS) TABS tablet Take 1 tablet by mouth daily. 08/29/19   Azucena Fallen, MD   pantoprazole (PROTONIX) 40 MG tablet TAKE 1 TABLET (40 MG TOTAL) BY MOUTH DAILY. 12/20/19 12/19/20  [name], MD  Sennosides (EX-LAX PO) Take 1 tablet by mouth daily as needed (constipation). Patient not taking: Reported on 02/14/2020    [provider]  spironolactone (ALDACTONE) 50 MG tablet TAKE 1 TABLET (50 MG TOTAL) BY MOUTH DAILY. 03/06/20   [name], MD  tamsulosin (FLOMAX) 0.4 MG CAPS capsule TAKE 1 CAPSULE (0.4 MG TOTAL) BY MOUTH DAILY. 12/20/19   [name], MD  thiamine 100 MG tablet Take 1 tablet (100 mg total) by mouth daily. Patient not taking: Reported on 02/14/2020 08/29/19   Azucena Fallen, MD  traZODone (DESYREL) 100 MG tablet Take 1.5 tablets (150 mg total) by mouth at bedtime as needed for sleep. 01/04/20   [name], MD    Allergies    Bee venom, Lactose intolerance (gi), and Ultram [tramadol hcl]  Review of Systems   Review of Systems  Ten systems reviewed and are negative for acute change, except as noted in the HPI.    Physical Exam Updated Vital Signs BP 127/82 (BP Location: Right Arm)   Pulse 97   Temp 98.2 F (36.8 C) (Oral)   Resp 15   Ht 6\' 1"  (1.854 m)   Wt 89.1 kg   SpO2 99%   BMI 25.91 kg/m   Physical Exam Vitals and nursing note reviewed.  Constitutional:      General: He is not in acute distress.    Appearance: He is well-developed and well-nourished. He is not diaphoretic.     Comments: Nontoxic appearing and in NAD  HENT:     Head: Normocephalic and atraumatic.  Eyes:     General: No scleral icterus.    Extraocular Movements: EOM normal.     Conjunctiva/sclera: Conjunctivae normal.  Cardiovascular:     Rate and Rhythm: Normal rate and regular rhythm.     Pulses: Normal pulses.     Comments: HR in 90's on monitor. Pulmonary:     Effort: Pulmonary effort is normal. No respiratory distress.     Comments: Respirations even and unlabored Abdominal:     Comments: Hypoactive bowel sounds  all quadrants. Abdomen is soft and distended. No appreciable fluid wave. There is diffuse TTP. No involuntary guarding or peritoneal signs.  Genitourinary:    Comments: Exam chaperoned by RN. No gross blood or melena on DRE. Normal rectal tone. Musculoskeletal:        General: Normal range of motion.     Cervical back: Normal range of motion.  Skin:    General: Skin is warm and dry.     Coloration: Skin is not pale.     Findings: No erythema or rash.  Neurological:     Mental Status: He is alert and oriented to person, place, and time.     Coordination: Coordination normal.  Psychiatric:  Mood and Affect: Mood and affect normal.        Behavior: Behavior normal.     ED Results / Procedures / Treatments   Labs (all labs ordered are listed, but only abnormal results are displayed) Labs Reviewed  CBC WITH DIFFERENTIAL/PLATELET - Abnormal; Notable for the following components:      Result Value   WBC 12.6 (*)    RBC 3.17 (*)    Hemoglobin 7.5 (*)    HCT 24.7 (*)    MCV 77.9 (*)    MCH 23.7 (*)    RDW 18.3 (*)    Neutro Abs 8.9 (*)    Monocytes Absolute 1.1 (*)    All other components within normal limits  COMPREHENSIVE METABOLIC PANEL - Abnormal; Notable for the following components:   Sodium 134 (*)    CO2 20 (*)    Glucose, Bld 102 (*)    Total Protein 8.7 (*)    Alkaline Phosphatase 157 (*)    Total Bilirubin 1.4 (*)    All other components within normal limits  URINALYSIS, ROUTINE W REFLEX MICROSCOPIC - Abnormal; Notable for the following components:   Specific Gravity, Urine 1.033 (*)    All other components within normal limits  IRON AND TIBC - Abnormal; Notable for the following components:   Iron 18 (*)    TIBC 585 (*)    Saturation Ratios 3 (*)    All other components within normal limits  FERRITIN - Abnormal; Notable for the following components:   Ferritin 5 (*)    All other components within normal limits  RETICULOCYTES - Abnormal; Notable for the  following components:   RBC. 3.03 (*)    Immature Retic Fract 39.7 (*)    All other components within normal limits  POC OCCULT BLOOD, ED - Abnormal; Notable for the following components:   Fecal Occult Bld POSITIVE (*)    All other components within normal limits  SARS CORONAVIRUS 2 (TAT 6-24 HRS)  LIPASE, BLOOD  MAGNESIUM  PHOSPHORUS  VITAMIN B12  FOLATE  CBC  COMPREHENSIVE METABOLIC PANEL  TYPE AND SCREEN    EKG None  Radiology DG Chest 2 View  Result Date: 03/30/2020 CLINICAL DATA:  Dyspnea EXAM: CHEST - 2 VIEW COMPARISON:  10/11/2019 FINDINGS: Lungs are well expanded, symmetric, and clear. No pneumothorax or pleural effusion. Cardiac size within normal limits. Pulmonary vascularity is normal. Osseous structures are age-appropriate. No acute bone abnormality. IMPRESSION: No active cardiopulmonary disease. Electronically Signed   By: Helyn NumbersAshesh  Parikh MD   On: 03/30/2020 23:48   CT ABDOMEN PELVIS W CONTRAST  Result Date: 03/31/2020 CLINICAL DATA:  Acute nonlocalized abdominal pain. Nausea, vomiting. EXAM: CT ABDOMEN AND PELVIS WITH CONTRAST TECHNIQUE: Multidetector CT imaging of the abdomen and pelvis was performed using the standard protocol following bolus administration of intravenous contrast. CONTRAST:  100mL OMNIPAQUE IOHEXOL 300 MG/ML  SOLN COMPARISON:  10/11/2019 FINDINGS: Lower chest: The visualized lung bases are clear. Pectus excavatum deformity partially visualized. Cardiac size within normal limits. Hepatobiliary: Mild hepatomegaly is again seen. Heterogeneous enhancement of the liver but noted on prior examination is not well appreciated on this examination; no focal intrahepatic masses are identified. Recanalization of the umbilical vein and tiny gastroesophageal varices are again identified in keeping with changes of portal venous hypertension. No intra or extrahepatic biliary ductal dilation. Portal vein is patent. Gallbladder is unremarkable. There is mild periportal  edema identified, similar to that noted on prior examination. Pancreas: Unremarkable. Spleen: Mild splenomegaly  is stable. No intrasplenic lesions are identified. The splenic vein is patent. Adrenals/Urinary Tract: The adrenal glands are unremarkable. The kidneys are normal in size and position. Preserved cortical thickness and symmetric cortical enhancement. No hydronephrosis. No intrarenal or ureteral calculi. Tiny probable cortical cyst within the right kidney. The bladder is unremarkable. Stomach/Bowel: Trace ascites again noted. This appears improved since prior examination. The stomach, small bowel, and large bowel are otherwise unremarkable. Appendix normal. No free intraperitoneal gas. Vascular/Lymphatic: No pathologic adenopathy within the abdomen and pelvis. The abdominal vasculature is age-appropriate with mild aortoiliac atherosclerotic calcification. No aortic aneurysm. Reproductive: Prostate is unremarkable. Other: Retroperitoneal edema is again identified, similar to that noted on prior examination. Musculoskeletal: No acute bone abnormality. No lytic or blastic bone lesion identified. IMPRESSION: Stable hepatosplenomegaly. Recanalization of the umbilical vein and small gastroesophageal varices in keeping with changes of portal venous hypertension. Mild ascites, slightly improved, and retroperitoneal edema, stable. This may reflect changes of portal venous hypertension, but is nonspecific. Aortic Atherosclerosis (ICD10-I70.0). Electronically Signed   By: Helyn NumbersAshesh  Parikh MD   On: 03/31/2020 00:13    Procedures Procedures (including critical care time)  Medications Ordered in ED Medications  fentaNYL (SUBLIMAZE) injection 50 mcg (has no administration in time range)  pantoprazole (PROTONIX) injection 40 mg (has no administration in time range)  ondansetron (ZOFRAN) injection 4 mg (4 mg Intravenous Given 03/30/20 2307)    ED Course  I have reviewed the triage vital signs and the nursing  notes.  Pertinent labs & imaging results that were available during my care of the patient were reviewed by me and considered in my medical decision making (see chart for details).  Clinical Course as of 03/31/20 [id number]  Caleen EssexFri Mar 30, 2020  2306 Hemoccult positive [KH]  2354 Bladder scan with only 45mL. No present concern for retention given hx of decreased urinary stream. Creatinine normal.   Hgb today is 7.5 down from 10.3 one month ago. While chronic anemia is likely related to chronic alcoholism, this is quite different from his baseline. With positive hemoccult and hx of melena, likely to require observation for trending of CBC +/- endoscopy. IV protonix has been given. Symptomatic anemia could account for patient's c/o SOB. He has no dyspnea or tachypnea currently. Not hypoxic. Lungs grossly clear.   CT abdomen/pelvis pending. [KH]    Clinical Course User Index [KH] Antony MaduraHumes, Chelsei Mcchesney, PA-C   MDM Rules/Calculators/A&P                          45 year old male with a history of alcohol abuse and alcoholic cirrhosis with ascites presents to the ED for complaints of abdominal pain, nausea, vomiting.  Also reporting melanotic stool.  His evaluation in the ED is concerning for suspected GI bleed as his Hemoccult is positive.  He has had a 3 g hemoglobin drop in the past month from his baseline.  His chest x-ray and abdominal CT are without acute abnormality today.  He has remained hemodynamically stable without need for IV fluid resuscitation.  Pain well controlled with fentanyl, Protonix.  No vomiting since receiving Zofran.  Patient to be admitted by the hospitalist service for ongoing assessment.   Final Clinical Impression(s) / ED Diagnoses Final diagnoses:  Symptomatic anemia  Gastrointestinal hemorrhage, unspecified gastrointestinal hemorrhage type  Generalized abdominal pain    Rx / DC Orders ED Discharge Orders    None       Antony MaduraHumes, Merica Prell, PA-C 03/31/20 [id number]  Dione Booze,  MD 03/31/20 [id number]

## 2020-03-31 DIAGNOSIS — K922 Gastrointestinal hemorrhage, unspecified: Secondary | ICD-10-CM

## 2020-03-31 DIAGNOSIS — F1020 Alcohol dependence, uncomplicated: Secondary | ICD-10-CM

## 2020-03-31 DIAGNOSIS — K7031 Alcoholic cirrhosis of liver with ascites: Secondary | ICD-10-CM

## 2020-03-31 DIAGNOSIS — D62 Acute posthemorrhagic anemia: Secondary | ICD-10-CM

## 2020-03-31 DIAGNOSIS — Z21 Asymptomatic human immunodeficiency virus [HIV] infection status: Secondary | ICD-10-CM

## 2020-03-31 LAB — VITAMIN B12: Vitamin B-12: 297 pg/mL (ref 180–914)

## 2020-03-31 LAB — RETICULOCYTES
Immature Retic Fract: 39.7 % — ABNORMAL HIGH (ref 2.3–15.9)
RBC.: 3.03 MIL/uL — ABNORMAL LOW (ref 4.22–5.81)
Retic Count, Absolute: 81.2 10*3/uL (ref 19.0–186.0)
Retic Ct Pct: 2.7 % (ref 0.4–3.1)

## 2020-03-31 LAB — COMPREHENSIVE METABOLIC PANEL
ALT: 20 U/L (ref 0–44)
AST: 33 U/L (ref 15–41)
Albumin: 4 g/dL (ref 3.5–5.0)
Alkaline Phosphatase: 147 U/L — ABNORMAL HIGH (ref 38–126)
Anion gap: 13 (ref 5–15)
BUN: 8 mg/dL (ref 6–20)
CO2: 20 mmol/L — ABNORMAL LOW (ref 22–32)
Calcium: 9 mg/dL (ref 8.9–10.3)
Chloride: 103 mmol/L (ref 98–111)
Creatinine, Ser: 1.03 mg/dL (ref 0.61–1.24)
GFR, Estimated: >60 mL/min (ref >60)
Glucose, Bld: 105 mg/dL — ABNORMAL HIGH (ref 70–99)
Potassium: 4.4 mmol/L (ref 3.5–5.1)
Sodium: 136 mmol/L (ref 135–145)
Total Bilirubin: 1.6 mg/dL — ABNORMAL HIGH (ref 0.3–1.2)
Total Protein: 8.2 g/dL — ABNORMAL HIGH (ref 6.5–8.1)

## 2020-03-31 LAB — URINALYSIS, ROUTINE W REFLEX MICROSCOPIC
Bilirubin Urine: NEGATIVE
Glucose, UA: NEGATIVE mg/dL
Hgb urine dipstick: NEGATIVE
Ketones, ur: NEGATIVE mg/dL
Leukocytes,Ua: NEGATIVE
Nitrite: NEGATIVE
Protein, ur: NEGATIVE mg/dL
Specific Gravity, Urine: 1.033 — ABNORMAL HIGH (ref 1.005–1.030)
pH: 5 (ref 5.0–8.0)

## 2020-03-31 LAB — PHOSPHORUS: Phosphorus: 3.8 mg/dL (ref 2.5–4.6)

## 2020-03-31 LAB — CBC
HCT: 22.7 % — ABNORMAL LOW (ref 39.0–52.0)
HCT: 23.5 % — ABNORMAL LOW (ref 39.0–52.0)
Hemoglobin: 6.7 g/dL — CL (ref 13.0–17.0)
Hemoglobin: 6.7 g/dL — CL (ref 13.0–17.0)
MCH: 23.3 pg — ABNORMAL LOW (ref 26.0–34.0)
MCH: 22.9 pg — ABNORMAL LOW (ref 26.0–34.0)
MCHC: 29.5 g/dL — ABNORMAL LOW (ref 30.0–36.0)
MCHC: 28.5 g/dL — ABNORMAL LOW (ref 30.0–36.0)
MCV: 78.8 fL — ABNORMAL LOW (ref 80.0–100.0)
MCV: 80.5 fL (ref 80.0–100.0)
Platelets: 152 10*3/uL (ref 150–400)
Platelets: 160 10*3/uL (ref 150–400)
RBC: 2.88 MIL/uL — ABNORMAL LOW (ref 4.22–5.81)
RBC: 2.92 MIL/uL — ABNORMAL LOW (ref 4.22–5.81)
RDW: 18.3 % — ABNORMAL HIGH (ref 11.5–15.5)
RDW: 18.5 % — ABNORMAL HIGH (ref 11.5–15.5)
WBC: 8.5 10*3/uL (ref 4.0–10.5)
WBC: 7.7 10*3/uL (ref 4.0–10.5)
nRBC: 0 % (ref 0.0–0.2)
nRBC: 0 % (ref 0.0–0.2)

## 2020-03-31 LAB — IRON AND TIBC
Iron: 18 ug/dL — ABNORMAL LOW (ref 45–182)
Saturation Ratios: 3 % — ABNORMAL LOW (ref 17.9–39.5)
TIBC: 585 ug/dL — ABNORMAL HIGH (ref 250–450)
UIBC: 567 ug/dL

## 2020-03-31 LAB — FOLATE: Folate: 17.7 ng/mL (ref >5.9)

## 2020-03-31 LAB — PREPARE RBC (CROSSMATCH)

## 2020-03-31 LAB — SARS CORONAVIRUS 2 (TAT 6-24 HRS): SARS Coronavirus 2: NEGATIVE

## 2020-03-31 LAB — MAGNESIUM: Magnesium: 2 mg/dL (ref 1.7–2.4)

## 2020-03-31 LAB — FERRITIN: Ferritin: 5 ng/mL — ABNORMAL LOW (ref 24–336)

## 2020-03-31 MED ORDER — SODIUM CHLORIDE 0.9 % IV SOLN
8.0000 mg/h | INTRAVENOUS | Status: DC
  Administered 2020-03-31 – 2020-04-02 (×5): 8 mg/h via INTRAVENOUS
  Filled 2020-03-31 (×8): qty 80

## 2020-03-31 MED ORDER — PANTOPRAZOLE SODIUM 40 MG IV SOLR
40.0000 mg | Freq: Once | INTRAVENOUS | Status: AC
  Administered 2020-03-31: 40 mg via INTRAVENOUS
  Filled 2020-03-31: qty 40

## 2020-03-31 MED ORDER — ADULT MULTIVITAMIN W/MINERALS CH
1.0000 | ORAL_TABLET | Freq: Every day | ORAL | Status: DC
  Administered 2020-03-31 – 2020-04-02 (×4): 1 via ORAL
  Filled 2020-03-31 (×3): qty 1

## 2020-03-31 MED ORDER — ONDANSETRON HCL 4 MG PO TABS: 4.0000 mg | ORAL_TABLET | Freq: Four times a day (QID) | ORAL | Status: DC | PRN

## 2020-03-31 MED ORDER — SODIUM CHLORIDE 0.9 % IV SOLN
50.0000 ug/h | INTRAVENOUS | Status: DC
  Administered 2020-03-31 – 2020-04-03 (×6): 50 ug/h via INTRAVENOUS
  Filled 2020-03-31 (×10): qty 1

## 2020-03-31 MED ORDER — THIAMINE HCL 100 MG/ML IJ SOLN: 100.0000 mg | Freq: Every day | INTRAMUSCULAR | Status: DC

## 2020-03-31 MED ORDER — PANTOPRAZOLE SODIUM 40 MG IV SOLR: 40.0000 mg | Freq: Two times a day (BID) | INTRAVENOUS | Status: DC

## 2020-03-31 MED ORDER — LORAZEPAM 1 MG PO TABS
1.0000 mg | ORAL_TABLET | ORAL | Status: AC | PRN
  Administered 2020-03-31 – 2020-04-02 (×6): 2 mg via ORAL
  Filled 2020-03-31 (×7): qty 2

## 2020-03-31 MED ORDER — LORAZEPAM 2 MG/ML IJ SOLN
1.0000 mg | INTRAMUSCULAR | Status: AC | PRN
  Administered 2020-03-31 – 2020-04-02 (×2): 2 mg via INTRAVENOUS
  Filled 2020-03-31 (×2): qty 1

## 2020-03-31 MED ORDER — ACETAMINOPHEN 650 MG RE SUPP: 650.0000 mg | Freq: Four times a day (QID) | RECTAL | Status: DC | PRN

## 2020-03-31 MED ORDER — SODIUM CHLORIDE 0.9% IV SOLUTION: Freq: Once | INTRAVENOUS | Status: AC

## 2020-03-31 MED ORDER — ONDANSETRON HCL 4 MG/2ML IJ SOLN: 4.0000 mg | Freq: Four times a day (QID) | INTRAMUSCULAR | Status: DC | PRN

## 2020-03-31 MED ORDER — NICOTINE 21 MG/24HR TD PT24
21.0000 mg | MEDICATED_PATCH | Freq: Every day | TRANSDERMAL | Status: DC
  Administered 2020-03-31 – 2020-04-03 (×3): 21 mg via TRANSDERMAL
  Filled 2020-03-31 (×5): qty 1

## 2020-03-31 MED ORDER — FERROUS SULFATE 325 (65 FE) MG PO TABS: 325.0000 mg | ORAL_TABLET | Freq: Three times a day (TID) | ORAL | Status: DC

## 2020-03-31 MED ORDER — BICTEGRAVIR-EMTRICITAB-TENOFOV 50-200-25 MG PO TABS
1.0000 | ORAL_TABLET | Freq: Every day | ORAL | Status: DC
  Administered 2020-03-31 – 2020-04-03 (×4): 1 via ORAL
  Filled 2020-03-31 (×4): qty 1

## 2020-03-31 MED ORDER — THIAMINE HCL 100 MG PO TABS
100.0000 mg | ORAL_TABLET | Freq: Every day | ORAL | Status: DC
  Administered 2020-03-31 – 2020-04-03 (×4): 100 mg via ORAL
  Filled 2020-03-31 (×5): qty 1

## 2020-03-31 MED ORDER — FOLIC ACID 1 MG PO TABS
1.0000 mg | ORAL_TABLET | Freq: Every day | ORAL | Status: DC
  Administered 2020-03-31 – 2020-04-03 (×4): 1 mg via ORAL
  Filled 2020-03-31 (×5): qty 1

## 2020-03-31 MED ORDER — SODIUM CHLORIDE 0.9 % IV SOLN
2.0000 g | INTRAVENOUS | Status: DC
  Administered 2020-03-31: 2 g via INTRAVENOUS
  Filled 2020-03-31: qty 20

## 2020-03-31 MED ORDER — ACETAMINOPHEN 325 MG PO TABS
650.0000 mg | ORAL_TABLET | Freq: Four times a day (QID) | ORAL | Status: DC | PRN
  Administered 2020-04-02: 650 mg via ORAL
  Filled 2020-03-31: qty 2

## 2020-03-31 MED ORDER — OCTREOTIDE LOAD VIA INFUSION
50.0000 ug | Freq: Once | INTRAVENOUS | Status: AC
  Administered 2020-03-31: 50 ug via INTRAVENOUS
  Filled 2020-03-31: qty 25

## 2020-03-31 NOTE — Progress Notes (Addendum)
PROGRESS NOTE    Thomas Atkins  XAJ:[id number] DOB: [birth year] DOA: 03/30/2020 PCP: [name], MD   Brief Narrative: Thomas Atkins is a 45 y.o. male with a history of alcohol abuse, cirrhosis, ascites and HIV. He presented secondary to coffee ground emesis x3 in addition to abdominal pain with concern for upper GI bleeding.   Assessment & Plan:   Principal Problem:   GIB (gastrointestinal bleeding) Active Problems:   Alcohol use disorder, moderate, dependence (HCC)   HIV (human immunodeficiency virus infection) (HCC)   Abdominal pain   Alcoholic cirrhosis of liver with ascites (HCC)   Acute blood loss anemia   Coffee ground emesis Multiple episodes the day prior to admission. GI consulted on admission. Patient started on Protonix IV, octreotide IV -Continue Protonix/octreotide -GI recommendations: plan for EGD once alcohol withdrawal improved or if worsening bleeding  Abdominal pain Patient has had this pain for over one year. Unlikely SBP. No mental status changes. No leukocytosis. Likely related to above and likely underlying gastritis. -Discontinue Ceftriaxone  Alcohol abuse Patient drinks about 6 12oz bottles of beer per day. Patient has had alcohol withdrawal in the past with no prior ICU admissions but has had alcohol withdrawal seizures as an outpatient. Patient does not have any desire to quit drinking alcohol at this time; risks of continued alcohol consumption explained at bedside. -Continue CIWA  Cirrhosis with ascites Total bilirubin of 1.6. No significant ascites on CT abdomen/pelvis. -INR  HIV infection Last CD4 count of 1145 and viral load of 73 from 02/2020 -Continue Biktarvy   DVT prophylaxis: SCDs Code Status:   Code Status: Full Code Family Communication: None at bedside Disposition Plan: Discharge likely in several days pending workup for coffee ground emesis, stabilization of anemia, improvement of alcohol withdrawal  symptoms   Consultants:   [facility]  Procedures:   None  Antimicrobials:  Ceftriaxone IV    Subjective: Thirsty. Some abdominal pain but it has improved. No recurrent emesis since admission. No bloody stools. Had some nausea last night but none this morning.  Objective: Vitals:   03/31/20 0100 03/31/20 0230 03/31/20 0300 03/31/20 0325  BP: 104/69 103/61  107/74  Pulse: 86 100 100 96  Resp: 18 16  19   Temp:  98.4 F (36.9 C)  98.6 F (37 C)  TempSrc:  Oral  Oral  SpO2: 95% 100%  98%  Weight:      Height:        Intake/Output Summary (Last 24 hours) at 03/31/2020 0831 Last data filed at 03/31/2020 0600 Gross per 24 hour  Intake 0 ml  Output --  Net 0 ml   Filed Weights   03/30/20 2238  Weight: 89.1 kg    Examination:  General exam: Appears calm and comfortable Respiratory system: Clear to auscultation. Respiratory effort normal. Cardiovascular system: S1 & S2 heard, RRR. No murmurs, rubs, gallops or clicks. Gastrointestinal system: Abdomen is nondistended, soft and tender in epigastric region. Normal bowel sounds heard. Central nervous system: Alert and oriented. No focal neurological deficits. Musculoskeletal: No edema. No calf tenderness Skin: No cyanosis. No rashes Psychiatry: Judgement and insight appear normal. Mood & affect appropriate.     Data Reviewed: I have personally reviewed following labs and imaging studies  CBC Lab Results  Component Value Date   WBC 8.5 03/31/2020   RBC 2.88 (L) 03/31/2020   HGB 6.7 (LL) 03/31/2020   HCT 22.7 (L) 03/31/2020   MCV 78.8 (L) 03/31/2020   MCH  23.3 (L) 03/31/2020   PLT 152 03/31/2020   MCHC 29.5 (L) 03/31/2020   RDW 18.3 (H) 03/31/2020   LYMPHSABS 2.1 03/30/2020   MONOABS 1.1 (H) 03/30/2020   EOSABS 0.4 03/30/2020   BASOSABS 0.1 03/30/2020     Last metabolic panel Lab Results  Component Value Date   NA 136 03/31/2020   K 4.4 03/31/2020   CL 103 03/31/2020   CO2 20 (L) 03/31/2020   BUN 8  03/31/2020   CREATININE 1.03 03/31/2020   GLUCOSE 105 (H) 03/31/2020   GFRNONAA >60 03/31/2020   GFRAA 104 02/14/2020   CALCIUM 9.0 03/31/2020   PHOS 3.8 03/30/2020   PROT 8.2 (H) 03/31/2020   ALBUMIN 4.0 03/31/2020   LABGLOB 4.6 (H) 07/22/2019   AGRATIO 0.7 (L) 07/22/2019   BILITOT 1.6 (H) 03/31/2020   ALKPHOS 147 (H) 03/31/2020   AST 33 03/31/2020   ALT 20 03/31/2020   ANIONGAP 13 03/31/2020    CBG (last 3)  No results for input(s): GLUCAP in the last 72 hours.   GFR: Estimated Creatinine Clearance: 101.3 mL/min (by C-G formula based on SCr of 1.03 mg/dL).  Coagulation Profile: No results for input(s): INR, PROTIME in the last 168 hours.  No results found for this or any previous visit (from the past 240 hour(s)).      Radiology Studies: DG Chest 2 View  Result Date: 03/30/2020 CLINICAL DATA:  Dyspnea EXAM: CHEST - 2 VIEW COMPARISON:  10/11/2019 FINDINGS: Lungs are well expanded, symmetric, and clear. No pneumothorax or pleural effusion. Cardiac size within normal limits. Pulmonary vascularity is normal. Osseous structures are age-appropriate. No acute bone abnormality. IMPRESSION: No active cardiopulmonary disease. Electronically Signed   By: Helyn Numbers MD   On: 03/30/2020 23:48   CT ABDOMEN PELVIS W CONTRAST  Result Date: 03/31/2020 CLINICAL DATA:  Acute nonlocalized abdominal pain. Nausea, vomiting. EXAM: CT ABDOMEN AND PELVIS WITH CONTRAST TECHNIQUE: Multidetector CT imaging of the abdomen and pelvis was performed using the standard protocol following bolus administration of intravenous contrast. CONTRAST:  OMNIPAQUE IOHEXOL 300 MG/ML  SOLN COMPARISON:  10/11/2019 FINDINGS: Lower chest: The visualized lung bases are clear. Pectus excavatum deformity partially visualized. Cardiac size within normal limits. Hepatobiliary: Mild hepatomegaly is again seen. Heterogeneous enhancement of the liver but noted on prior examination is not well appreciated on this  examination; no focal intrahepatic masses are identified. Recanalization of the umbilical vein and tiny gastroesophageal varices are again identified in keeping with changes of portal venous hypertension. No intra or extrahepatic biliary ductal dilation. Portal vein is patent. Gallbladder is unremarkable. There is mild periportal edema identified, similar to that noted on prior examination. Pancreas: Unremarkable. Spleen: Mild splenomegaly is stable. No intrasplenic lesions are identified. The splenic vein is patent. Adrenals/Urinary Tract: The adrenal glands are unremarkable. The kidneys are normal in size and position. Preserved cortical thickness and symmetric cortical enhancement. No hydronephrosis. No intrarenal or ureteral calculi. Tiny probable cortical cyst within the right kidney. The bladder is unremarkable. Stomach/Bowel: Trace ascites again noted. This appears improved since prior examination. The stomach, small bowel, and large bowel are otherwise unremarkable. Appendix normal. No free intraperitoneal gas. Vascular/Lymphatic: No pathologic adenopathy within the abdomen and pelvis. The abdominal vasculature is age-appropriate with mild aortoiliac atherosclerotic calcification. No aortic aneurysm. Reproductive: Prostate is unremarkable. Other: Retroperitoneal edema is again identified, similar to that noted on prior examination. Musculoskeletal: No acute bone abnormality. No lytic or blastic bone lesion identified. IMPRESSION: Stable hepatosplenomegaly. Recanalization of the umbilical  vein and small gastroesophageal varices in keeping with changes of portal venous hypertension. Mild ascites, slightly improved, and retroperitoneal edema, stable. This may reflect changes of portal venous hypertension, but is nonspecific. Aortic Atherosclerosis (ICD10-I70.0). Electronically Signed   By: Helyn Numbers MD   On: 03/31/2020 00:13        Scheduled Meds: . bictegravir-emtricitabine-tenofovir AF  1 tablet  Oral Daily  . folic acid  1 mg Oral Daily  . multivitamin with minerals  1 tablet Oral Daily  . [START ON 04/03/2020] pantoprazole  40 mg Intravenous Q12H  . thiamine  100 mg Oral Daily   Or  . thiamine  100 mg Intravenous Daily   Continuous Infusions: . cefTRIAXone (ROCEPHIN)  IV 2 g (03/31/20 0228)  . octreotide  (SANDOSTATIN)    IV infusion    . pantoprozole (PROTONIX) infusion 8 mg/hr (03/31/20 0302)     LOS: 0 days     Jacquelin Hawking, MD [facility] 03/31/2020, 8:31 AM  If 7PM-7AM, please contact night-coverage [email or web address]

## 2020-03-31 NOTE — Consult Note (Signed)
[facility] Consultation Note  Referring Provider: [facility] Primary Care Physician:  [name], MD  Reason for Consultation:  Anemia, HPS  HPI: Thomas Atkins is a 45 y.o. male presenting anemia, HPS, weakness.  Has seen [place] about one year ago in hospital, once in office, and then has since missed his last appointments.  Patient is somnolent and can't carry on much of a conversation.  Patient does admit to ongoing alcohol use.  Has had intermittent melena and GI bleeding, dating back to about one year ago; endoscopy was recommended at that time which he declined at that time.  Has prior history of recurrent paracenteses, which he's fortunately not required of late. Question of recent prior melena; not in hospital.  Some vomiting but no hematemesis.   Past Medical History:  Diagnosis Date  . Alcoholism (HCC)   . Anxiety   . Depression   . HIV infection (HCC)   . Seizure disorder (HCC)   . Syncope     Past Surgical History:  Procedure Laterality Date  . IR PARACENTESIS  07/01/2019  . IR PARACENTESIS  07/15/2019  . IR PARACENTESIS  07/27/2019  . IR PARACENTESIS  08/03/2019  . IR PARACENTESIS  08/10/2019  . IR PARACENTESIS  08/24/2019  . SURGERY SCROTAL / TESTICULAR      Prior to Admission medications   Medication Sig Start Date End Date Taking? Authorizing Provider  Aspirin-Salicylamide-Caffeine (BC HEADACHE POWDER PO) Take 1 packet by mouth 2 (two) times daily as needed (pain/headache).   Yes [provider]  bictegravir-emtricitabine-tenofovir AF (BIKTARVY) 50-200-25 MG TABS tablet Take 1 tablet by mouth daily. 02/14/20  Yes Veryl Speak, FNP  Bismuth Subsalicylate (PEPTO-BISMOL PO) Take 1 tablet by mouth daily as needed (heartburn/indigestion).   Yes [provider]  furosemide (LASIX) 20 MG tablet TAKE 1 TABLET (20 MG TOTAL) BY MOUTH DAILY. 03/06/20  Yes [name], MD  Multiple Vitamin (MULTIVITAMIN WITH MINERALS) TABS tablet  Take 1 tablet by mouth daily. 08/29/19  Yes Azucena Fallen, MD  pantoprazole (PROTONIX) 40 MG tablet TAKE 1 TABLET (40 MG TOTAL) BY MOUTH DAILY. 12/20/19 12/19/20 Yes [name], MD  Sennosides (EX-LAX PO) Take 1 tablet by mouth daily as needed (constipation).   Yes [provider]  spironolactone (ALDACTONE) 50 MG tablet TAKE 1 TABLET (50 MG TOTAL) BY MOUTH DAILY. 03/06/20  Yes [name], MD  tamsulosin (FLOMAX) 0.4 MG CAPS capsule TAKE 1 CAPSULE (0.4 MG TOTAL) BY MOUTH DAILY. 12/20/19  Yes [name], MD  traZODone (DESYREL) 100 MG tablet Take 1.5 tablets (150 mg total) by mouth at bedtime as needed for sleep. 01/04/20  Yes [name], MD  thiamine 100 MG tablet Take 1 tablet (100 mg total) by mouth daily. Patient not taking: No sig reported 08/29/19   Azucena Fallen, MD    Current Facility-Administered Medications  Medication Dose Route Frequency Provider Last Rate Last Admin  . acetaminophen (TYLENOL) tablet 650 mg  650 mg Oral Q6H PRN Hillary Bow, DO       Or  . acetaminophen (TYLENOL) suppository 650 mg  650 mg Rectal Q6H PRN Hillary Bow, DO      . bictegravir-emtricitabine-tenofovir AF (BIKTARVY) 50-200-25 MG per tablet 1 tablet  1 tablet Oral Daily Hillary Bow, DO   1 tablet at 03/31/20 2536  . cefTRIAXone (ROCEPHIN) 2 g in sodium chloride 0.9 % 100 mL IVPB  2 g Intravenous Q24H Julian Reil,  Heywood Iles, DO 200 mL/hr at 03/31/20 0228 2 g at 03/31/20 0228  . folic acid (FOLVITE) tablet 1 mg  1 mg Oral Daily Lyda Perone M, DO   1 mg at 03/31/20 6606  . LORazepam (ATIVAN) tablet 1-4 mg  1-4 mg Oral Q1H PRN Hillary Bow, DO   2 mg at 03/31/20 3016   Or  . LORazepam (ATIVAN) injection 1-4 mg  1-4 mg Intravenous Q1H PRN Hillary Bow, DO   2 mg at 03/31/20 0413  . multivitamin with minerals tablet 1 tablet  1 tablet Oral Daily Lyda Perone M, DO   1 tablet at 03/31/20 0946  . nicotine (NICODERM CQ - dosed in mg/24 hours)  patch 21 mg  21 mg Transdermal Daily Narda Bonds, MD   21 mg at 03/31/20 0945  . octreotide (SANDOSTATIN) 500 mcg in sodium chloride 0.9 % 250 mL (2 mcg/mL) infusion  50 mcg/hr Intravenous Continuous Hillary Bow, DO      . ondansetron [facility]) tablet 4 mg  4 mg Oral Q6H PRN Hillary Bow, DO       Or  . ondansetron [facility]) injection 4 mg  4 mg Intravenous Q6H PRN Hillary Bow, DO      . pantoprazole (PROTONIX) 80 mg in sodium chloride 0.9 % 100 mL (0.8 mg/mL) infusion  8 mg/hr Intravenous Continuous Hillary Bow, DO 10 mL/hr at 03/31/20 0302 8 mg/hr at 03/31/20 0302  . [START ON 04/03/2020] pantoprazole (PROTONIX) injection 40 mg  40 mg Intravenous Q12H Lyda Perone M, DO      . thiamine tablet 100 mg  100 mg Oral Daily Lyda Perone M, DO   100 mg at 03/31/20 0109   Or  . thiamine (B-1) injection 100 mg  100 mg Intravenous Daily Hillary Bow, DO        Allergies as of 03/30/2020 - Review Complete 03/30/2020  Allergen Reaction Noted  . Bee venom Anaphylaxis 12/19/2015  . Lactose intolerance (gi) Diarrhea, Nausea Only, and Other (See Comments) 12/01/2013  . Ultram [tramadol hcl] Hives 12/13/2010    Family History  Problem Relation Age of Onset  . Heart disease Paternal Grandmother   . Bipolar disorder Father     Social History   Socioeconomic History  . Marital status: Divorced    Spouse name: Not on file  . Number of children: 2  . Years of education: Not on file  . Highest education level: Not on file  Occupational History  . Occupation: Unemployed  Tobacco Use  . Smoking status: Current Every Day Smoker    Packs/day: 1.00    Years: 30.00    Pack years: 30.00    Types: Cigarettes  . Smokeless tobacco: Never Used  . Tobacco comment: Patient refused cessation materials  Vaping Use  . Vaping Use: Never used  Substance and Sexual Activity  . Alcohol use: Yes    Alcohol/week: 24.0 standard drinks    Types: 24 Cans of beer per week    Comment: 6  pack per day  . Drug use: Yes    Types: Marijuana, Cocaine    Comment: on occasion  . Sexual activity: Not on file  Other Topics Concern  . Not on file  Social History Narrative  . Not on file   Social Determinants of Health   Financial Resource Strain: Not on file  Food Insecurity: Not on file  Transportation Needs: Not on file  Physical Activity: Not on file  Stress: Not on file  Social Connections: Not on file  Intimate Partner Violence: Not on file    Review of Systems: Unable to obtain due to altered mental status  Physical Exam: Vital signs in last 24 hours: Temp:  [98.2 F (36.8 C)-98.6 F (37 C)] 98.6 F (37 C) (01/22 0325) Pulse Rate:  [86-102] 96 (01/22 0934) Resp:  [13-19] 19 (01/22 0325) BP: (103-128)/(61-85) 106/66 (01/22 0934) SpO2:  [95 %-100 %] 98 % (01/22 0325) Weight:  [89.1 kg] 89.1 kg (01/21 2238) Last BM Date: 03/30/20 General:  Somnolent, arouseable but can't carry on a conversation Head:  Normocephalic and atraumatic. Eyes:  Sclera clear, no icterus.   Conjunctiva pink. Ears:  Normal auditory acuity. Nose:  No deformity, discharge,  or lesions. Mouth:  No deformity or lesions.  Oropharynx pink & moist. Neck:  Supple; no masses or thyromegaly. Abdomen:  Soft, mild tenderness RUQ. No masses, hepatosplenomegaly or hernias noted. Normal bowel sounds, without guarding, and without rebound.     Msk:  Symmetrical without gross deformities. Normal posture. Pulses:  Normal pulses noted. Extremities:  Without clubbing or edema. Neurologic: Somnolent, arouseable but readily falls back to sleep Skin:  Intact without significant lesions or rashes. Scattered ecchymoses and telangiectasias Cervical Nodes:  No significant cervical adenopathy. Psych:  Somnolent, arouseable   Lab Results: Recent Labs    03/30/20 2246 03/31/20 0717 03/31/20 0852  WBC 12.6* 8.5 7.7  HGB 7.5* 6.7* 6.7*  HCT 24.7* 22.7* 23.5*  PLT 192 152 160   BMET Recent Labs     03/30/20 2246 03/31/20 0717  NA 134* 136  K 3.6 4.4  CL 101 103  CO2 20* 20*  GLUCOSE 102* 105*  BUN 8 8  CREATININE 0.90 1.03  CALCIUM 9.3 9.0   LFT Recent Labs    03/31/20 0717  PROT 8.2*  ALBUMIN 4.0  AST 33  ALT 20  ALKPHOS 147*  BILITOT 1.6*   PT/INR No results for input(s): LABPROT, INR in the last 72 hours.  Studies/Results: DG Chest 2 View  Result Date: 03/30/2020 CLINICAL DATA:  Dyspnea EXAM: CHEST - 2 VIEW COMPARISON:  10/11/2019 FINDINGS: Lungs are well expanded, symmetric, and clear. No pneumothorax or pleural effusion. Cardiac size within normal limits. Pulmonary vascularity is normal. Osseous structures are age-appropriate. No acute bone abnormality. IMPRESSION: No active cardiopulmonary disease. Electronically Signed   By: Helyn Numbers MD   On: 03/30/2020 23:48   CT ABDOMEN PELVIS W CONTRAST  Result Date: 03/31/2020 CLINICAL DATA:  Acute nonlocalized abdominal pain. Nausea, vomiting. EXAM: CT ABDOMEN AND PELVIS WITH CONTRAST TECHNIQUE: Multidetector CT imaging of the abdomen and pelvis was performed using the standard protocol following bolus administration of intravenous contrast. CONTRAST:  OMNIPAQUE IOHEXOL 300 MG/ML  SOLN COMPARISON:  10/11/2019 FINDINGS: Lower chest: The visualized lung bases are clear. Pectus excavatum deformity partially visualized. Cardiac size within normal limits. Hepatobiliary: Mild hepatomegaly is again seen. Heterogeneous enhancement of the liver but noted on prior examination is not well appreciated on this examination; no focal intrahepatic masses are identified. Recanalization of the umbilical vein and tiny gastroesophageal varices are again identified in keeping with changes of portal venous hypertension. No intra or extrahepatic biliary ductal dilation. Portal vein is patent. Gallbladder is unremarkable. There is mild periportal edema identified, similar to that noted on prior examination. Pancreas: Unremarkable. Spleen: Mild  splenomegaly is stable. No intrasplenic lesions are identified. The splenic vein is patent. Adrenals/Urinary Tract: The adrenal glands are unremarkable. The kidneys  are normal in size and position. Preserved cortical thickness and symmetric cortical enhancement. No hydronephrosis. No intrarenal or ureteral calculi. Tiny probable cortical cyst within the right kidney. The bladder is unremarkable. Stomach/Bowel: Trace ascites again noted. This appears improved since prior examination. The stomach, small bowel, and large bowel are otherwise unremarkable. Appendix normal. No free intraperitoneal gas. Vascular/Lymphatic: No pathologic adenopathy within the abdomen and pelvis. The abdominal vasculature is age-appropriate with mild aortoiliac atherosclerotic calcification. No aortic aneurysm. Reproductive: Prostate is unremarkable. Other: Retroperitoneal edema is again identified, similar to that noted on prior examination. Musculoskeletal: No acute bone abnormality. No lytic or blastic bone lesion identified. IMPRESSION: Stable hepatosplenomegaly. Recanalization of the umbilical vein and small gastroesophageal varices in keeping with changes of portal venous hypertension. Mild ascites, slightly improved, and retroperitoneal edema, stable. This may reflect changes of portal venous hypertension, but is nonspecific. Aortic Atherosclerosis (ICD10-I70.0). Electronically Signed   By: Helyn NumbersAshesh  Parikh MD   On: 03/31/2020 00:13   Impression:  1.  Anemia.  Hemoccult positive.  Hgb 6.7.  No active GI bleeding. 2.  Ongoing alcohol use. 3.  Alcohol mediated cirrhosis. 4.  Ascites with prior paracentesis.  Minimal ascites on current scan.  Plan:  1.  Patient somnolent; was given oral lorazepam a couple hours before I saw him, CIWA protocol.   2.  For the weekend, medical management as below... 3.  Serial CBCs, consider 1 U prbcs (judiciously given his cirrhosis and portal hypertension) if Hgb keeps dropping. 4.  Clear  liquid diet today. 5.  Continue PPI and octreotide and antibiotics. 6.  If overt GI bleeding over weekend, would do endoscopy tomorrow; otherwise, would try to get patient out of alcohol withdrawal and into more of an alert mental status before consideration of endoscopy (most likely Monday). 7.  [facility] will follow.   LOS: 0 days   Simonne Boulos M  03/31/2020, 11:45 AM  Cell [phone] If no answer or after 5 PM call [phone]

## 2020-03-31 NOTE — Progress Notes (Signed)
CRITICAL VALUE ALERT  Critical Value:  6.7  Date & Time Notied:  03/31/2020 0750  Provider Notified: Jodean Lima  Orders Received/Actions taken: awaiting follow up from MD

## 2020-03-31 NOTE — Progress Notes (Signed)
Blood consent signed. Attepmted to access SL. Pt reports it hurts and burns. IV Team consult placed. Assessed by 2 RNs for access. Will await IV team consult

## 2020-03-31 NOTE — H&P (Signed)
History and Physical    Thomas Atkins WGN:[id number] DOB: September 16, 1975 DOA: 03/30/2020  PCP: [name], MD  Patient coming from: Home  I have personally briefly reviewed patient's old medical records in [facility]  Chief Complaint: Abd pain  HPI: Thomas Atkins is a 45 y.o. male with medical history significant of EtOH abuse, cirrhosis with ascites, HIV.  Pt previously required frequent paracentesis but hasnt needed to have one since June 21, he attributes this to being complaint with diuretics.  Today he had acute onset of abd pain a few hours PTA to the ED.  Pain has been constant, diffuse, radiating to B flanks.  Associated reduced urine output.  Says that his "kidneys hurt".  Pain worse with movement and palpation.  Associated NBNB emesis x5 episodes.  Also had some black stools today.  Did drink beer today.  No fever, abd not more distended than baseline.   ED Course: Pain resolved after just 1 dose of fentanyl in ED.  HGB 7.5 down from 10.x last month.  Heme occult+, though no gross melena on exam.  CT abd/pelvis: 1) minimal ascites 2) portal venous HTN, small gastric and esophageal varices.   Review of Systems: As per HPI, otherwise all review of systems negative.  Past Medical History:  Diagnosis Date  . Alcoholism (HCC)   . Anxiety   . Depression   . HIV infection (HCC)   . Seizure disorder (HCC)   . Syncope     Past Surgical History:  Procedure Laterality Date  . IR PARACENTESIS  07/01/2019  . IR PARACENTESIS  07/15/2019  . IR PARACENTESIS  07/27/2019  . IR PARACENTESIS  08/03/2019  . IR PARACENTESIS  08/10/2019  . IR PARACENTESIS  08/24/2019  . SURGERY SCROTAL / TESTICULAR       reports that he has been smoking cigarettes. He has a 30.00 pack-year smoking history. He has never used smokeless tobacco. He reports current alcohol use of about 24.0 standard drinks of alcohol per week. He reports current drug use. Drugs: Marijuana and  Cocaine.  Allergies  Allergen Reactions  . Bee Venom Anaphylaxis  . Lactose Intolerance (Gi) Diarrhea, Nausea Only and Other (See Comments)    Abdominal pain    . Ultram [Tramadol Hcl] Hives         Family History  Problem Relation Age of Onset  . Heart disease Paternal Grandmother   . Bipolar disorder Father      Prior to Admission medications   Medication Sig Start Date End Date Taking? Authorizing Provider  Aspirin-Salicylamide-Caffeine (BC HEADACHE POWDER PO) Take 1 packet by mouth 2 (two) times daily as needed (pain/headache).    [provider]  bictegravir-emtricitabine-tenofovir AF (BIKTARVY) 50-200-25 MG TABS tablet Take 1 tablet by mouth daily. 02/14/20   Veryl Speak, FNP  Bismuth Subsalicylate (PEPTO-BISMOL PO) Take 1 tablet by mouth daily as needed (heartburn/indigestion). Patient not taking: Reported on 02/14/2020    [provider]  furosemide (LASIX) 20 MG tablet TAKE 1 TABLET (20 MG TOTAL) BY MOUTH DAILY. 03/06/20   [name], MD  Multiple Vitamin (MULTIVITAMIN WITH MINERALS) TABS tablet Take 1 tablet by mouth daily. 08/29/19   Azucena Fallen, MD  pantoprazole (PROTONIX) 40 MG tablet TAKE 1 TABLET (40 MG TOTAL) BY MOUTH DAILY. 12/20/19 12/19/20  [name], MD  Sennosides (EX-LAX PO) Take 1 tablet by mouth daily as needed (constipation). Patient not taking: Reported on 02/14/2020    [provider]  spironolactone (ALDACTONE) 50 MG tablet TAKE 1 TABLET (50 MG TOTAL) BY MOUTH DAILY. 03/06/20   [name], MD  tamsulosin (FLOMAX) 0.4 MG CAPS capsule TAKE 1 CAPSULE (0.4 MG TOTAL) BY MOUTH DAILY. 12/20/19   [name], MD  thiamine 100 MG tablet Take 1 tablet (100 mg total) by mouth daily. Patient not taking: Reported on 02/14/2020 08/29/19   Azucena Fallen, MD  traZODone (DESYREL) 100 MG tablet Take 1.5 tablets (150 mg total) by mouth at bedtime as needed for sleep. 01/04/20   [name], MD     Physical Exam: Vitals:   03/31/20 0100 03/31/20 0230 03/31/20 0300 03/31/20 0325  BP: 104/69 103/61  107/74  Pulse: 86 100 100 96  Resp: 18 16  19   Temp:  98.4 F (36.9 C)  98.6 F (37 C)  TempSrc:  Oral  Oral  SpO2: 95% 100%  98%  Weight:      Height:        Constitutional: NAD, calm, comfortable Eyes: PERRL, lids and conjunctivae normal ENMT: Mucous membranes are moist. Posterior pharynx clear of any exudate or lesions.Normal dentition.  Neck: normal, supple, no masses, no thyromegaly Respiratory: clear to auscultation bilaterally, no wheezing, no crackles. Normal respiratory effort. No accessory muscle use.  Cardiovascular: Regular rate and rhythm, no murmurs / rubs / gallops. No extremity edema. 2+ pedal pulses. No carotid bruits.  Abdomen: TTP3 Musculoskeletal: no clubbing / cyanosis. No joint deformity upper and lower extremities. Good ROM, no contractures. Normal muscle tone.  Skin: no rashes, lesions, ulcers. No induration Neurologic: CN 2-12 grossly intact. Sensation intact, DTR normal. Strength 5/5 in all 4.  Psychiatric: Normal judgment and insight. Alert and oriented x 3. Normal mood.    Labs on Admission: I have personally reviewed following labs and imaging studies  CBC: Recent Labs  Lab 03/30/20 2246  WBC 12.6*  NEUTROABS 8.9*  HGB 7.5*  HCT 24.7*  MCV 77.9*  PLT 192   Basic Metabolic Panel: Recent Labs  Lab 03/30/20 2246  NA 134*  K 3.6  CL 101  CO2 20*  GLUCOSE 102*  BUN 8  CREATININE 0.90  CALCIUM 9.3  MG 2.0  PHOS 3.8   GFR: Estimated Creatinine Clearance: 115.9 mL/min (by C-G formula based on SCr of 0.9 mg/dL). Liver Function Tests: Recent Labs  Lab 03/30/20 2246  AST 38  ALT 23  ALKPHOS 157*  BILITOT 1.4*  PROT 8.7*  ALBUMIN 4.1   Recent Labs  Lab 03/30/20 2246  LIPASE 32   No results for input(s): AMMONIA in the last 168 hours. Coagulation Profile: No results for input(s): INR, PROTIME in the last 168  hours. Cardiac Enzymes: No results for input(s): CKTOTAL, CKMB, CKMBINDEX, TROPONINI in the last 168 hours. BNP (last 3 results) No results for input(s): PROBNP in the last 8760 hours. HbA1C: No results for input(s): HGBA1C in the last 72 hours. CBG: No results for input(s): GLUCAP in the last 168 hours. Lipid Profile: No results for input(s): CHOL, HDL, LDLCALC, TRIG, CHOLHDL, LDLDIRECT in the last 72 hours. Thyroid Function Tests: No results for input(s): TSH, T4TOTAL, FREET4, T3FREE, THYROIDAB in the last 72 hours. Anemia Panel: Recent Labs    03/31/20 0144  VITAMINB12 297  FOLATE 17.7  FERRITIN 5*  TIBC 585*  IRON 18*  RETICCTPCT 2.7   Urine analysis:    Component Value Date/Time   COLORURINE YELLOW 03/30/2020 2246   APPEARANCEUR CLEAR 03/30/2020 2246   LABSPEC  1.033 (H) 03/30/2020 2246   PHURINE 5.0 03/30/2020 2246   GLUCOSEU NEGATIVE 03/30/2020 2246   HGBUR NEGATIVE 03/30/2020 2246   BILIRUBINUR NEGATIVE 03/30/2020 2246   KETONESUR NEGATIVE 03/30/2020 2246   PROTEINUR NEGATIVE 03/30/2020 2246   UROBILINOGEN 0.2 02/19/2013 1819   NITRITE NEGATIVE 03/30/2020 2246   LEUKOCYTESUR NEGATIVE 03/30/2020 2246    Radiological Exams on Admission: DG Chest 2 View  Result Date: 03/30/2020 CLINICAL DATA:  Dyspnea EXAM: CHEST - 2 VIEW COMPARISON:  10/11/2019 FINDINGS: Lungs are well expanded, symmetric, and clear. No pneumothorax or pleural effusion. Cardiac size within normal limits. Pulmonary vascularity is normal. Osseous structures are age-appropriate. No acute bone abnormality. IMPRESSION: No active cardiopulmonary disease. Electronically Signed   By: Helyn Numbers MD   On: 03/30/2020 23:48   CT ABDOMEN PELVIS W CONTRAST  Result Date: 03/31/2020 CLINICAL DATA:  Acute nonlocalized abdominal pain. Nausea, vomiting. EXAM: CT ABDOMEN AND PELVIS WITH CONTRAST TECHNIQUE: Multidetector CT imaging of the abdomen and pelvis was performed using the standard protocol following bolus  administration of intravenous contrast. CONTRAST:  OMNIPAQUE IOHEXOL 300 MG/ML  SOLN COMPARISON:  10/11/2019 FINDINGS: Lower chest: The visualized lung bases are clear. Pectus excavatum deformity partially visualized. Cardiac size within normal limits. Hepatobiliary: Mild hepatomegaly is again seen. Heterogeneous enhancement of the liver but noted on prior examination is not well appreciated on this examination; no focal intrahepatic masses are identified. Recanalization of the umbilical vein and tiny gastroesophageal varices are again identified in keeping with changes of portal venous hypertension. No intra or extrahepatic biliary ductal dilation. Portal vein is patent. Gallbladder is unremarkable. There is mild periportal edema identified, similar to that noted on prior examination. Pancreas: Unremarkable. Spleen: Mild splenomegaly is stable. No intrasplenic lesions are identified. The splenic vein is patent. Adrenals/Urinary Tract: The adrenal glands are unremarkable. The kidneys are normal in size and position. Preserved cortical thickness and symmetric cortical enhancement. No hydronephrosis. No intrarenal or ureteral calculi. Tiny probable cortical cyst within the right kidney. The bladder is unremarkable. Stomach/Bowel: Trace ascites again noted. This appears improved since prior examination. The stomach, small bowel, and large bowel are otherwise unremarkable. Appendix normal. No free intraperitoneal gas. Vascular/Lymphatic: No pathologic adenopathy within the abdomen and pelvis. The abdominal vasculature is age-appropriate with mild aortoiliac atherosclerotic calcification. No aortic aneurysm. Reproductive: Prostate is unremarkable. Other: Retroperitoneal edema is again identified, similar to that noted on prior examination. Musculoskeletal: No acute bone abnormality. No lytic or blastic bone lesion identified. IMPRESSION: Stable hepatosplenomegaly. Recanalization of the umbilical vein and small  gastroesophageal varices in keeping with changes of portal venous hypertension. Mild ascites, slightly improved, and retroperitoneal edema, stable. This may reflect changes of portal venous hypertension, but is nonspecific. Aortic Atherosclerosis (ICD10-I70.0). Electronically Signed   By: Helyn Numbers MD   On: 03/31/2020 00:13    EKG: Independently reviewed.  Assessment/Plan Principal Problem:   GIB (gastrointestinal bleeding) Active Problems:   Alcohol use disorder, moderate, dependence (HCC)   HIV (human immunodeficiency virus infection) (HCC)   Abdominal pain   Alcoholic cirrhosis of liver with ascites (HCC)   Acute blood loss anemia    1. GIB - with blood loss anemia 1. Not clear if acute vs a more chronic bleed and just iron def anemia at this point 2. Start PO iron since iron low 3. Presence of esophageal varices on CT scan is somewhat worrisome though. 1. PPI bolus and gtt 2. Octreotide bolus and gtt 3. Empiric rocephin for SBP ppx 4. SBP  considered with the abd pain, but doesn't sound like enough fluid to tap at this point on CT result 4. GI consult in AM 5. Tele monitor 6. NPO 7. Repeat CBC this morning 8. Type and screen 9. Transfuse if needed 2. EtOH cirrhosis of liver - 1. Holding diuretics in setting of GIB 3. HIV - 1. Cont HAART 4. EtOH abuse - 1. CIWA  DVT prophylaxis: SCDs Code Status: Full Family Communication: No family in room Disposition Plan: Home after work up for GIB Consults called: Sent message to Dr. Dulce Sellarutlaw for GI eval in AM Admission status: Place in 86obs    Renald Haithcock M. DO [facility]  How to contact the [facility] Attending or Consulting provider 7A - 7P or covering provider during after hours 7P -7A, for this patient?  1. Check the care team in [facility] and look for a) attending/consulting [facility] provider listed and b) the [facility] team listed 2. Log into [email or web address]  Amion Physician Scheduling and messaging for groups and whole hospitals   On call and physician scheduling software for group practices, residents, hospitalists and other medical providers for call, clinic, rotation and shift schedules. OnCall Enterprise is a hospital-wide system for scheduling doctors and paging doctors on call. EasyPlot is for scientific plotting and data analysis.  [email or web address]  and use [facility]'s universal password to access. If you do not have the password, please contact the hospital operator.  3. Locate the [facility] provider you are looking for under [facility] and page to a number that you can be directly reached. 4. If you still have difficulty reaching the provider, please page the [facility] (Director on Call) for the Hospitalists listed on amion for assistance.  03/31/2020, 4:16 AM

## 2020-04-01 LAB — CBC
HCT: 25 % — ABNORMAL LOW (ref 39.0–52.0)
Hemoglobin: 7.4 g/dL — ABNORMAL LOW (ref 13.0–17.0)
MCH: 23.5 pg — ABNORMAL LOW (ref 26.0–34.0)
MCHC: 29.6 g/dL — ABNORMAL LOW (ref 30.0–36.0)
MCV: 79.4 fL — ABNORMAL LOW (ref 80.0–100.0)
Platelets: 162 10*3/uL (ref 150–400)
RBC: 3.15 MIL/uL — ABNORMAL LOW (ref 4.22–5.81)
RDW: 18.5 % — ABNORMAL HIGH (ref 11.5–15.5)
WBC: 6.3 10*3/uL (ref 4.0–10.5)
nRBC: 0 % (ref 0.0–0.2)

## 2020-04-01 LAB — PROTIME-INR
INR: 1.2 (ref 0.8–1.2)
Prothrombin Time: 15.2 seconds (ref 11.4–15.2)

## 2020-04-01 NOTE — Progress Notes (Signed)
PROGRESS NOTE    Thomas Atkins  FUX:[id number] DOB: [birth year] DOA: 03/30/2020 PCP: [name], MD   Brief Narrative: Thomas Atkins is a 45 y.o. male with a history of alcohol abuse, cirrhosis, ascites and HIV. He presented secondary to coffee ground emesis x3 in addition to abdominal pain with concern for upper GI bleeding.   Assessment & Plan:   Principal Problem:   GIB (gastrointestinal bleeding) Active Problems:   Alcohol use disorder, moderate, dependence (HCC)   HIV (human immunodeficiency virus infection) (HCC)   Abdominal pain   Alcoholic cirrhosis of liver with ascites (HCC)   Acute blood loss anemia   Coffee ground emesis Multiple episodes the day prior to admission. GI consulted on admission. Patient started on Protonix IV, octreotide IV. Fecal occult positive stool -Continue Protonix/octreotide -GI recommendations: plan for EGD once alcohol withdrawal improved or if worsening bleeding  Acute blood loss anemia Secondary to GI bleeding Received 1 unit of PRBC -Daily CBC; repeat sooner if recurrent bleeding  Abdominal pain Patient has had this pain for over one year. Unlikely SBP. No mental status changes. No leukocytosis. Likely related to above and likely underlying gastritis. Ceftriaxone discontinued.  Alcohol abuse Patient drinks about 6 12oz bottles of beer per day. Patient has had alcohol withdrawal in the past with no prior ICU admissions but has had alcohol withdrawal seizures as an outpatient. Patient does not have any desire to quit drinking alcohol at this time; risks of continued alcohol consumption explained at bedside. -Continue CIWA  Cirrhosis with ascites Total bilirubin of 1.6. No significant ascites on CT abdomen/pelvis. INR of 1.2.  HIV infection Last CD4 count of 1145 and viral load of 73 from 02/2020 -Continue Biktarvy   DVT prophylaxis: SCDs Code Status:   Code Status: Full Code Family Communication: None at bedside Disposition  Plan: Discharge likely in several days pending workup for coffee ground emesis, stabilization of anemia, improvement of alcohol withdrawal symptoms   Consultants:   [facility]  Procedures:   None  Antimicrobials:  Ceftriaxone IV    Subjective: No issues overnight. No bowel movements. No emesis.  Objective: Vitals:   03/31/20 1945 03/31/20 2210 03/31/20 2212 04/01/20 0449  BP: 110/76  106/70 99/69  Pulse: 80  73 73  Resp: 16  17 20   Temp: 98.1 F (36.7 C)  98 F (36.7 C) 97.9 F (36.6 C)  TempSrc: Oral Oral Oral   SpO2: 96%  94% 96%  Weight:      Height:        Intake/Output Summary (Last 24 hours) at 04/01/2020 0858 Last data filed at 04/01/2020 0500 Gross per 24 hour  Intake 533.3 ml  Output 1000 ml  Net -466.7 ml   Filed Weights   03/30/20 2238  Weight: 89.1 kg    Examination:  General exam: Appears calm and comfortable Respiratory system: Clear to auscultation. Respiratory effort normal. Cardiovascular system: S1 & S2 heard, RRR. No murmurs, rubs, gallops or clicks. Gastrointestinal system: Abdomen is nondistended, soft and nontender. No organomegaly or masses felt. Normal bowel sounds heard. Central nervous system: Alert and oriented. No focal neurological deficits. No tremors Musculoskeletal: No edema. No calf tenderness Skin: No cyanosis. No rashes Psychiatry: Judgement and insight appear normal. Mood & affect appropriate.     Data Reviewed: I have personally reviewed following labs and imaging studies  CBC Lab Results  Component Value Date   WBC 6.3 04/01/2020   RBC 3.15 (L) 04/01/2020   HGB  7.4 (L) 04/01/2020   HCT 25.0 (L) 04/01/2020   MCV 79.4 (L) 04/01/2020   MCH 23.5 (L) 04/01/2020   PLT 162 04/01/2020   MCHC 29.6 (L) 04/01/2020   RDW 18.5 (H) 04/01/2020   LYMPHSABS 2.1 03/30/2020   MONOABS 1.1 (H) 03/30/2020   EOSABS 0.4 03/30/2020   BASOSABS 0.1 03/30/2020     Last metabolic panel Lab Results  Component Value Date   NA 136  03/31/2020   K 4.4 03/31/2020   CL 103 03/31/2020   CO2 20 (L) 03/31/2020   BUN 8 03/31/2020   CREATININE 1.03 03/31/2020   GLUCOSE 105 (H) 03/31/2020   GFRNONAA >60 03/31/2020   GFRAA 104 02/14/2020   CALCIUM 9.0 03/31/2020   PHOS 3.8 03/30/2020   PROT 8.2 (H) 03/31/2020   ALBUMIN 4.0 03/31/2020   LABGLOB 4.6 (H) 07/22/2019   AGRATIO 0.7 (L) 07/22/2019   BILITOT 1.6 (H) 03/31/2020   ALKPHOS 147 (H) 03/31/2020   AST 33 03/31/2020   ALT 20 03/31/2020   ANIONGAP 13 03/31/2020    CBG (last 3)  No results for input(s): GLUCAP in the last 72 hours.   GFR: Estimated Creatinine Clearance: 101.3 mL/min (by C-G formula based on SCr of 1.03 mg/dL).  Coagulation Profile: Recent Labs  Lab 04/01/20 0551  INR 1.2    Recent Results (from the past 240 hour(s))  SARS CORONAVIRUS 2 (TAT 6-24 HRS) Nasopharyngeal Nasopharyngeal Swab     Status: None   Collection Time: 03/31/20  1:32 AM   Specimen: Nasopharyngeal Swab  Result Value Ref Range Status   SARS Coronavirus 2 NEGATIVE NEGATIVE Final    Comment: (NOTE) SARS-CoV-2 target nucleic acids are NOT DETECTED.  The SARS-CoV-2 RNA is generally detectable in upper and lower respiratory specimens during the acute phase of infection. Negative results do not preclude SARS-CoV-2 infection, do not rule out co-infections with other pathogens, and should not be used as the sole basis for treatment or other patient management decisions. Negative results must be combined with clinical observations, patient history, and epidemiological information. The expected result is Negative.  Fact Sheet for Patients: HairSlick.no  Fact Sheet for Healthcare Providers: quierodirigir.com  This test is not yet approved or cleared by the [place] FDA and  has been authorized for detection and/or diagnosis of SARS-CoV-2 by FDA under an Emergency Use Authorization (EUA). This EUA will remain  in  effect (meaning this test can be used) for the duration of the COVID-19 declaration under Se ction 564(b)(1) of the Act, 21 U.S.C. section 360bbb-3(b)(1), unless the authorization is terminated or revoked sooner.  Performed at [facility], [address], [place], [place] [address]         Radiology Studies: DG Chest 2 View  Result Date: 03/30/2020 CLINICAL DATA:  Dyspnea EXAM: CHEST - 2 VIEW COMPARISON:  10/11/2019 FINDINGS: Lungs are well expanded, symmetric, and clear. No pneumothorax or pleural effusion. Cardiac size within normal limits. Pulmonary vascularity is normal. Osseous structures are age-appropriate. No acute bone abnormality. IMPRESSION: No active cardiopulmonary disease. Electronically Signed   By: Helyn Numbers MD   On: 03/30/2020 23:48   CT ABDOMEN PELVIS W CONTRAST  Result Date: 03/31/2020 CLINICAL DATA:  Acute nonlocalized abdominal pain. Nausea, vomiting. EXAM: CT ABDOMEN AND PELVIS WITH CONTRAST TECHNIQUE: Multidetector CT imaging of the abdomen and pelvis was performed using the standard protocol following bolus administration of intravenous contrast. CONTRAST:  OMNIPAQUE IOHEXOL 300 MG/ML  SOLN COMPARISON:  10/11/2019 FINDINGS: Lower  chest: The visualized lung bases are clear. Pectus excavatum deformity partially visualized. Cardiac size within normal limits. Hepatobiliary: Mild hepatomegaly is again seen. Heterogeneous enhancement of the liver but noted on prior examination is not well appreciated on this examination; no focal intrahepatic masses are identified. Recanalization of the umbilical vein and tiny gastroesophageal varices are again identified in keeping with changes of portal venous hypertension. No intra or extrahepatic biliary ductal dilation. Portal vein is patent. Gallbladder is unremarkable. There is mild periportal edema identified, similar to that noted on prior examination. Pancreas: Unremarkable. Spleen: Mild splenomegaly is stable. No  intrasplenic lesions are identified. The splenic vein is patent. Adrenals/Urinary Tract: The adrenal glands are unremarkable. The kidneys are normal in size and position. Preserved cortical thickness and symmetric cortical enhancement. No hydronephrosis. No intrarenal or ureteral calculi. Tiny probable cortical cyst within the right kidney. The bladder is unremarkable. Stomach/Bowel: Trace ascites again noted. This appears improved since prior examination. The stomach, small bowel, and large bowel are otherwise unremarkable. Appendix normal. No free intraperitoneal gas. Vascular/Lymphatic: No pathologic adenopathy within the abdomen and pelvis. The abdominal vasculature is age-appropriate with mild aortoiliac atherosclerotic calcification. No aortic aneurysm. Reproductive: Prostate is unremarkable. Other: Retroperitoneal edema is again identified, similar to that noted on prior examination. Musculoskeletal: No acute bone abnormality. No lytic or blastic bone lesion identified. IMPRESSION: Stable hepatosplenomegaly. Recanalization of the umbilical vein and small gastroesophageal varices in keeping with changes of portal venous hypertension. Mild ascites, slightly improved, and retroperitoneal edema, stable. This may reflect changes of portal venous hypertension, but is nonspecific. Aortic Atherosclerosis (ICD10-I70.0). Electronically Signed   By: Helyn Numbers MD   On: 03/31/2020 00:13        Scheduled Meds: . bictegravir-emtricitabine-tenofovir AF  1 tablet Oral Daily  . folic acid  1 mg Oral Daily  . multivitamin with minerals  1 tablet Oral Daily  . nicotine  21 mg Transdermal Daily  . [START ON 04/03/2020] pantoprazole  40 mg Intravenous Q12H  . thiamine  100 mg Oral Daily   Or  . thiamine  100 mg Intravenous Daily   Continuous Infusions: . octreotide  (SANDOSTATIN)    IV infusion 50 mcg/hr (03/31/20 2359)  . pantoprozole (PROTONIX) infusion 8 mg/hr (04/01/20 1610)     LOS: 1 day      Jacquelin Hawking, MD [facility] 04/01/2020, 8:58 AM  If 7PM-7AM, please contact night-coverage [email or web address]

## 2020-04-01 NOTE — Progress Notes (Signed)
Subjective: Abdominal pain improving. No overt GI bleeding. Received 1 Unit PRBCs yesterday.  Objective: Vital signs in last 24 hours: Temp:  [97.9 F (36.6 C)-98.9 F (37.2 C)] 97.9 F (36.6 C) (01/23 0449) Pulse Rate:  [66-80] 66 (01/23 0950) Resp:  [12-20] 20 (01/23 0449) BP: (99-116)/(68-78) 105/78 (01/23 0950) SpO2:  [94 %-96 %] 96 % (01/23 0449) Weight change:  Last BM Date: 03/30/20  PE: GEN:  NAD ABD: Soft, mild epigastric tenderness.  Lab Results: CBC    Component Value Date/Time   WBC 6.3 04/01/2020 0551   RBC 3.15 (L) 04/01/2020 0551   HGB 7.4 (L) 04/01/2020 0551   HGB 15.7 09/01/2018 1146   HCT 25.0 (L) 04/01/2020 0551   HCT 46.2 09/01/2018 1146   PLT 162 04/01/2020 0551   PLT 262 09/01/2018 1146   MCV 79.4 (L) 04/01/2020 0551   MCV 96 09/01/2018 1146   MCH 23.5 (L) 04/01/2020 0551   MCHC 29.6 (L) 04/01/2020 0551   RDW 18.5 (H) 04/01/2020 0551   RDW 14.7 09/01/2018 1146   LYMPHSABS 2.1 03/30/2020 2246   MONOABS 1.1 (H) 03/30/2020 2246   EOSABS 0.4 03/30/2020 2246   BASOSABS 0.1 03/30/2020 2246   CMP     Component Value Date/Time   NA 136 03/31/2020 0717   NA 134 07/22/2019 1158   K 4.4 03/31/2020 0717   CL 103 03/31/2020 0717   CO2 20 (L) 03/31/2020 0717   GLUCOSE 105 (H) 03/31/2020 0717   BUN 8 03/31/2020 0717   BUN 6 07/22/2019 1158   CREATININE 1.03 03/31/2020 0717   CREATININE 1.00 02/14/2020 1537   CALCIUM 9.0 03/31/2020 0717   PROT 8.2 (H) 03/31/2020 0717   PROT 7.9 07/22/2019 1158   ALBUMIN 4.0 03/31/2020 0717   ALBUMIN 3.3 (L) 07/22/2019 1158   AST 33 03/31/2020 0717   ALT 20 03/31/2020 0717   ALKPHOS 147 (H) 03/31/2020 0717   BILITOT 1.6 (H) 03/31/2020 0717   BILITOT 1.6 (H) 07/22/2019 1158   GFRNONAA >60 03/31/2020 0717   GFRNONAA 90 02/14/2020 1537   GFRAA 104 02/14/2020 1537   Assessment:  1.  Anemia.  Hemoccult positive.  No active GI bleeding. 2.  Ongoing alcohol use. 3.  Alcohol mediated cirrhosis. 4.  Ascites  with prior paracentesis.  Minimal ascites on current scan.  Plan:  1.  Supportive management (IVF, octreotide, PPI). 2.  Soft diet, NPO after midnight. 3.  Endoscopy tomorrow.  If unrevealing, consider colonoscopy. 4.  Risks (bleeding, infection, bowel perforation that could require surgery, sedation-related changes in cardiopulmonary systems), benefits (identification and possible treatment of source of symptoms, exclusion of certain causes of symptoms), and alternatives (watchful waiting, radiographic imaging studies, empiric medical treatment) of upper endoscopy (EGD) were explained to patient/family in detail and patient wishes to proceed. 5.  [facility] will follow.   Freddy Jaksch 04/01/2020, 1:13 PM   Cell [phone] If no answer or after 5 PM call [phone]

## 2020-04-01 NOTE — H&P (View-Only) (Signed)
Subjective: Abdominal pain improving. No overt GI bleeding. Received 1 Unit PRBCs yesterday.  Objective: Vital signs in last 24 hours: Temp:  [97.9 F (36.6 C)-98.9 F (37.2 C)] 97.9 F (36.6 C) (01/23 0449) Pulse Rate:  [66-80] 66 (01/23 0950) Resp:  [12-20] 20 (01/23 0449) BP: (99-116)/(68-78) 105/78 (01/23 0950) SpO2:  [94 %-96 %] 96 % (01/23 0449) Weight change:  Last BM Date: 03/30/20  PE: GEN:  NAD ABD: Soft, mild epigastric tenderness.  Lab Results: CBC    Component Value Date/Time   WBC 6.3 04/01/2020 0551   RBC 3.15 (L) 04/01/2020 0551   HGB 7.4 (L) 04/01/2020 0551   HGB 15.7 09/01/2018 1146   HCT 25.0 (L) 04/01/2020 0551   HCT 46.2 09/01/2018 1146   PLT 162 04/01/2020 0551   PLT 262 09/01/2018 1146   MCV 79.4 (L) 04/01/2020 0551   MCV 96 09/01/2018 1146   MCH 23.5 (L) 04/01/2020 0551   MCHC 29.6 (L) 04/01/2020 0551   RDW 18.5 (H) 04/01/2020 0551   RDW 14.7 09/01/2018 1146   LYMPHSABS 2.1 03/30/2020 2246   MONOABS 1.1 (H) 03/30/2020 2246   EOSABS 0.4 03/30/2020 2246   BASOSABS 0.1 03/30/2020 2246   CMP     Component Value Date/Time   NA 136 03/31/2020 0717   NA 134 07/22/2019 1158   K 4.4 03/31/2020 0717   CL 103 03/31/2020 0717   CO2 20 (L) 03/31/2020 0717   GLUCOSE 105 (H) 03/31/2020 0717   BUN 8 03/31/2020 0717   BUN 6 07/22/2019 1158   CREATININE 1.03 03/31/2020 0717   CREATININE 1.00 02/14/2020 1537   CALCIUM 9.0 03/31/2020 0717   PROT 8.2 (H) 03/31/2020 0717   PROT 7.9 07/22/2019 1158   ALBUMIN 4.0 03/31/2020 0717   ALBUMIN 3.3 (L) 07/22/2019 1158   AST 33 03/31/2020 0717   ALT 20 03/31/2020 0717   ALKPHOS 147 (H) 03/31/2020 0717   BILITOT 1.6 (H) 03/31/2020 0717   BILITOT 1.6 (H) 07/22/2019 1158   GFRNONAA >60 03/31/2020 0717   GFRNONAA 90 02/14/2020 1537   GFRAA 104 02/14/2020 1537   Assessment:  1.  Anemia.  Hemoccult positive.  No active GI bleeding. 2.  Ongoing alcohol use. 3.  Alcohol mediated cirrhosis. 4.  Ascites  with prior paracentesis.  Minimal ascites on current scan.  Plan:  1.  Supportive management (IVF, octreotide, PPI). 2.  Soft diet, NPO after midnight. 3.  Endoscopy tomorrow.  If unrevealing, consider colonoscopy. 4.  Risks (bleeding, infection, bowel perforation that could require surgery, sedation-related changes in cardiopulmonary systems), benefits (identification and possible treatment of source of symptoms, exclusion of certain causes of symptoms), and alternatives (watchful waiting, radiographic imaging studies, empiric medical treatment) of upper endoscopy (EGD) were explained to patient/family in detail and patient wishes to proceed. 5.  [facility] will follow.   Tiffiney Sparrow M 04/01/2020, 1:13 PM   Cell [phone] If no answer or after 5 PM call [phone]  

## 2020-04-02 ENCOUNTER — Encounter (HOSPITAL_COMMUNITY): Payer: Self-pay | Admitting: Internal Medicine

## 2020-04-02 ENCOUNTER — Inpatient Hospital Stay (HOSPITAL_COMMUNITY): Payer: Self-pay | Admitting: Certified Registered Nurse Anesthetist

## 2020-04-02 ENCOUNTER — Encounter (HOSPITAL_COMMUNITY)
Admission: EM | Disposition: A | Discharge status: Home / Self Care | Payer: Self-pay | Source: Home / Self Care | Attending: Family Medicine

## 2020-04-02 HISTORY — PX: ESOPHAGOGASTRODUODENOSCOPY (EGD) WITH PROPOFOL: SHX5813

## 2020-04-02 HISTORY — PX: ESOPHAGEAL BANDING: SHX5518

## 2020-04-02 LAB — TYPE AND SCREEN
ABO/RH(D): O POS
Antibody Screen: NEGATIVE
Unit division: 0

## 2020-04-02 LAB — COMPREHENSIVE METABOLIC PANEL
ALT: 17 U/L (ref 0–44)
AST: 30 U/L (ref 15–41)
Albumin: 3.8 g/dL (ref 3.5–5.0)
Alkaline Phosphatase: 152 U/L — ABNORMAL HIGH (ref 38–126)
Anion gap: 8 (ref 5–15)
BUN: 13 mg/dL (ref 6–20)
CO2: 24 mmol/L (ref 22–32)
Calcium: 9.1 mg/dL (ref 8.9–10.3)
Chloride: 106 mmol/L (ref 98–111)
Creatinine, Ser: 0.97 mg/dL (ref 0.61–1.24)
GFR, Estimated: >60 mL/min (ref >60)
Glucose, Bld: 131 mg/dL — ABNORMAL HIGH (ref 70–99)
Potassium: 4 mmol/L (ref 3.5–5.1)
Sodium: 138 mmol/L (ref 135–145)
Total Bilirubin: 1.7 mg/dL — ABNORMAL HIGH (ref 0.3–1.2)
Total Protein: 8.1 g/dL (ref 6.5–8.1)

## 2020-04-02 LAB — CBC
HCT: 26.1 % — ABNORMAL LOW (ref 39.0–52.0)
Hemoglobin: 7.7 g/dL — ABNORMAL LOW (ref 13.0–17.0)
MCH: 23.3 pg — ABNORMAL LOW (ref 26.0–34.0)
MCHC: 29.5 g/dL — ABNORMAL LOW (ref 30.0–36.0)
MCV: 79.1 fL — ABNORMAL LOW (ref 80.0–100.0)
Platelets: 169 10*3/uL (ref 150–400)
RBC: 3.3 MIL/uL — ABNORMAL LOW (ref 4.22–5.81)
RDW: 18.5 % — ABNORMAL HIGH (ref 11.5–15.5)
WBC: 6 10*3/uL (ref 4.0–10.5)
nRBC: 0 % (ref 0.0–0.2)

## 2020-04-02 LAB — BPAM RBC
Blood Product Expiration Date: 202202192359
ISSUE DATE / TIME: 202201221922
Unit Type and Rh: 5100

## 2020-04-02 SURGERY — ESOPHAGOGASTRODUODENOSCOPY (EGD) WITH PROPOFOL
Anesthesia: Monitor Anesthesia Care | Laterality: Left

## 2020-04-02 MED ORDER — CALCIUM CARBONATE ANTACID 500 MG PO CHEW
1.0000 | CHEWABLE_TABLET | Freq: Three times a day (TID) | ORAL | Status: DC | PRN
  Administered 2020-04-02: 200 mg via ORAL
  Filled 2020-04-02: qty 1

## 2020-04-02 MED ORDER — SENNOSIDES 15 MG PO TABS: ORAL_TABLET | Freq: Every day | ORAL | Status: DC | PRN

## 2020-04-02 MED ORDER — ADULT MULTIVITAMIN W/MINERALS CH
1.0000 | ORAL_TABLET | Freq: Every day | ORAL | Status: DC
  Filled 2020-04-02: qty 1

## 2020-04-02 MED ORDER — MIDAZOLAM HCL 2 MG/2ML IJ SOLN
INTRAMUSCULAR | Status: AC
  Filled 2020-04-02: qty 2

## 2020-04-02 MED ORDER — PROPOFOL 500 MG/50ML IV EMUL
INTRAVENOUS | Status: DC | PRN
  Administered 2020-04-02: 150 ug/kg/min via INTRAVENOUS

## 2020-04-02 MED ORDER — PROPOFOL 10 MG/ML IV BOLUS
INTRAVENOUS | Status: DC | PRN
  Administered 2020-04-02: 40 mg via INTRAVENOUS
  Administered 2020-04-02: 20 mg via INTRAVENOUS
  Administered 2020-04-02 (×2): 30 mg via INTRAVENOUS

## 2020-04-02 MED ORDER — SENNA 8.6 MG PO TABS: 1.0000 | ORAL_TABLET | Freq: Every evening | ORAL | Status: DC | PRN

## 2020-04-02 MED ORDER — FUROSEMIDE 20 MG PO TABS
20.0000 mg | ORAL_TABLET | Freq: Every day | ORAL | Status: DC
  Administered 2020-04-02 – 2020-04-03 (×2): 20 mg via ORAL
  Filled 2020-04-02 (×2): qty 1

## 2020-04-02 MED ORDER — TAMSULOSIN HCL 0.4 MG PO CAPS
0.4000 mg | ORAL_CAPSULE | Freq: Every day | ORAL | Status: DC
  Administered 2020-04-02 – 2020-04-03 (×2): 0.4 mg via ORAL
  Filled 2020-04-02 (×2): qty 1

## 2020-04-02 MED ORDER — SPIRONOLACTONE 25 MG PO TABS
50.0000 mg | ORAL_TABLET | Freq: Every day | ORAL | Status: DC
  Administered 2020-04-02 – 2020-04-03 (×2): 50 mg via ORAL
  Filled 2020-04-02 (×2): qty 2

## 2020-04-02 MED ORDER — PROPOFOL 10 MG/ML IV BOLUS
INTRAVENOUS | Status: AC
  Filled 2020-04-02: qty 20

## 2020-04-02 MED ORDER — PANTOPRAZOLE SODIUM 40 MG PO TBEC: 40.0000 mg | DELAYED_RELEASE_TABLET | Freq: Every day | ORAL | Status: DC

## 2020-04-02 MED ORDER — TRAZODONE HCL 50 MG PO TABS
150.0000 mg | ORAL_TABLET | Freq: Every evening | ORAL | Status: DC | PRN
  Administered 2020-04-02: 150 mg via ORAL
  Filled 2020-04-02: qty 1

## 2020-04-02 MED ORDER — PROPOFOL 500 MG/50ML IV EMUL
INTRAVENOUS | Status: AC
  Filled 2020-04-02: qty 50

## 2020-04-02 MED ORDER — LACTATED RINGERS IV SOLN: INTRAVENOUS | Status: DC

## 2020-04-02 MED ORDER — MIDAZOLAM HCL 2 MG/2ML IJ SOLN
INTRAMUSCULAR | Status: DC | PRN
  Administered 2020-04-02: 2 mg via INTRAVENOUS

## 2020-04-02 MED ORDER — LIDOCAINE HCL (CARDIAC) PF 100 MG/5ML IV SOSY
PREFILLED_SYRINGE | INTRAVENOUS | Status: DC | PRN
  Administered 2020-04-02: 100 mg via INTRAVENOUS

## 2020-04-02 MED ORDER — OXYCODONE HCL 5 MG PO TABS: 5.0000 mg | ORAL_TABLET | ORAL | Status: DC | PRN

## 2020-04-02 MED ORDER — PANTOPRAZOLE SODIUM 40 MG IV SOLR
40.0000 mg | Freq: Two times a day (BID) | INTRAVENOUS | Status: DC
  Administered 2020-04-02 (×2): 40 mg via INTRAVENOUS
  Filled 2020-04-02 (×4): qty 40

## 2020-04-02 SURGICAL SUPPLY — 15 items

## 2020-04-02 NOTE — TOC Initial Note (Signed)
Transition of Care [facility]) - Initial/Assessment Note    Patient Details  Name: Thomas Atkins MRN: [id number] Date of Birth: Sep 05, 1975  [facility]) CM/SW Contact:    Lanier Clam, RN Phone Number: 04/02/2020, 1:44 PM  Clinical Narrative: referral for etoh resources-patient declines resources. No further CM needs.                  Expected Discharge Plan: Home/Self Care Barriers to Discharge: Continued Medical Work up   Patient Goals and CMS Choice Patient states their goals for this hospitalization and ongoing recovery are:: go home CMS Medicare.gov Compare Post Acute Care list provided to:: Patient Choice offered to / list presented to : Patient  Expected Discharge Plan and Services Expected Discharge Plan: Home/Self Care   Discharge Planning Services: CM Consult   Living arrangements for the past 2 months: Apartment                                      Prior Living Arrangements/Services Living arrangements for the past 2 months: Apartment Lives with:: Parents Patient language and need for interpreter reviewed:: Yes Do you feel safe going back to the place where you live?: Yes      Need for Family Participation in Patient Care: No (Comment) Care giver support system in place?: Yes (comment)   Criminal Activity/Legal Involvement Pertinent to Current Situation/Hospitalization: No - Comment as needed  Activities of Daily Living Home Assistive Devices/Equipment: Eyeglasses ADL Screening (condition at time of admission) Patient's cognitive ability adequate to safely complete daily activities?: Yes Is the patient deaf or have difficulty hearing?: No Does the patient have difficulty seeing, even when wearing glasses/contacts?: No Does the patient have difficulty concentrating, remembering, or making decisions?: No Patient able to express need for assistance with ADLs?: Yes Does the patient have difficulty dressing or bathing?: No Independently performs  ADLs?: Yes (appropriate for developmental age) Does the patient have difficulty walking or climbing stairs?: No Weakness of Legs: None Weakness of Arms/Hands: None  Permission Sought/Granted Permission sought to share information with : Case Manager Permission granted to share information with : Yes, Verbal Permission Granted  Share Information with NAME: Case manager           Emotional Assessment Appearance:: Appears stated age Attitude/Demeanor/Rapport: Gracious Affect (typically observed): Accepting Orientation: : Oriented to Self,Oriented to Place,Oriented to  Time,Oriented to Situation Alcohol / Substance Use: Alcohol Use Psych Involvement: No (comment)  Admission diagnosis:  Generalized abdominal pain [R10.84] GIB (gastrointestinal bleeding) [K92.2] Symptomatic anemia [D64.9] Gastrointestinal hemorrhage, unspecified gastrointestinal hemorrhage type [K92.2] Patient Active Problem List   Diagnosis Date Noted  . Acute blood loss anemia 03/31/2020  . GERD without esophagitis 10/11/2019  . BPH without obstruction/lower urinary tract symptoms 10/11/2019  . Abdominal pain 08/27/2019  . Alcoholic cirrhosis of liver with ascites (HCC) 08/27/2019  . Healthcare maintenance 08/03/2019  . Rash and nonspecific skin eruption 07/22/2019  . Constipation 06/16/2019  . HIV (human immunodeficiency virus infection) (HCC) 06/16/2019  . GIB (gastrointestinal bleeding) 05/26/2019  . History of hypertension 07/31/2017  . Alcohol use disorder, moderate, dependence (HCC) 07/31/2017  . Nicotine dependence, cigarettes, uncomplicated 07/31/2017  . Alcohol-induced insomnia (HCC) 07/31/2017  . Major depressive disorder, recurrent severe without psychotic features (HCC) 10/10/2015  . Alcohol abuse with alcohol-induced mood disorder (HCC) 05/31/2015  . Alcohol abuse 02/16/2013   PCP:  [name], MD Pharmacy:  [facility] [place] [place] [address] [place] [address] Phone: [phone] Fax: [phone]  [id number] [place], [place] - [address] AT [facility] [address] [place] [address] Phone: [phone] Fax: [phone]     Social Determinants of Health (SDOH) Interventions    Readmission Risk Interventions No flowsheet data found.

## 2020-04-02 NOTE — Progress Notes (Signed)
PROGRESS NOTE    Thomas Atkins  WCH:[id number] DOB: 1975-06-05 DOA: 03/30/2020 PCP: [name], MD   Brief Narrative: Thomas Atkins is a 45 y.o. male with a history of alcohol abuse, cirrhosis, ascites and HIV. He presented secondary to coffee ground emesis x3 in addition to abdominal pain with concern for upper GI bleeding.   Assessment & Plan:   Principal Problem:   GIB (gastrointestinal bleeding) Active Problems:   Alcohol use disorder, moderate, dependence (HCC)   HIV (human immunodeficiency virus infection) (HCC)   Abdominal pain   Alcoholic cirrhosis of liver with ascites (HCC)   Acute blood loss anemia   Coffee ground emesis Multiple episodes the day prior to admission. GI consulted on admission. Patient started on Protonix IV, octreotide IV. Fecal occult positive stool -Continue Protonix/octreotide -GI recommendations: plan for EGD today  Acute blood loss anemia Secondary to GI bleeding received 1 unit of PRBC. Hemoglobin stable. -Daily CBC; repeat sooner if recurrent bleeding  Abdominal pain Patient has had this pain for over one year. Unlikely SBP. No mental status changes. No leukocytosis. Likely related to above and likely underlying gastritis. Ceftriaxone discontinued.  Alcohol abuse Patient drinks about 6 12oz bottles of beer per day. Patient has had alcohol withdrawal in the past with no prior ICU admissions but has had alcohol withdrawal seizures as an outpatient. Patient does not have any desire to quit drinking alcohol at this time; risks of continued alcohol consumption explained at bedside. CIWA scores moderate. -Continue CIWA  Cirrhosis with ascites Total bilirubin of 1.6 and stable. No significant ascites on CT abdomen/pelvis. INR of 1.2.  HIV infection Last CD4 count of 1145 and viral load of 73 from 02/2020 -Continue Biktarvy   DVT prophylaxis: SCDs Code Status:   Code Status: Full Code Family Communication: None at bedside Disposition  Plan: Discharge likely in 2-4 days pending workup for coffee ground emesis, stabilization of anemia, improvement of alcohol withdrawal symptoms   Consultants:   [facility]  Procedures:   None  Antimicrobials:  Ceftriaxone IV    Subjective: Did not sleep until 5 AM this morning. No other issues.  Objective: Vitals:   04/01/20 0950 04/01/20 1334 04/01/20 2057 04/02/20 0436  BP: 105/78 113/80 103/87 112/73  Pulse: 66 64 66 68  Resp:    16  Temp:  98.4 F (36.9 C) 98.9 F (37.2 C) 98.9 F (37.2 C)  TempSrc:  Oral Oral   SpO2:  98% 96% 95%  Weight:      Height:        Intake/Output Summary (Last 24 hours) at 04/02/2020 0903 Last data filed at 04/02/2020 0815 Gross per 24 hour  Intake 975.33 ml  Output 1100 ml  Net -124.67 ml   Filed Weights   03/30/20 2238  Weight: 89.1 kg    Examination:  General exam: Appears calm and comfortable Respiratory system: Clear to auscultation. Respiratory effort normal. Cardiovascular system: S1 & S2 heard, RRR. No murmurs, rubs, gallops or clicks. Gastrointestinal system: Abdomen is nondistended, soft and tender in epigastric region. No organomegaly or masses felt. Normal bowel sounds heard. Central nervous system: Alert and oriented. No focal neurological deficits. Musculoskeletal: No edema. No calf tenderness Skin: No cyanosis. No rashes Psychiatry: Judgement and insight appear normal. Mood & affect appropriate.     Data Reviewed: I have personally reviewed following labs and imaging studies  CBC Lab Results  Component Value Date   WBC 6.0 04/02/2020   RBC 3.30 (L)  04/02/2020   HGB 7.7 (L) 04/02/2020   HCT 26.1 (L) 04/02/2020   MCV 79.1 (L) 04/02/2020   MCH 23.3 (L) 04/02/2020   PLT 169 04/02/2020   MCHC 29.5 (L) 04/02/2020   RDW 18.5 (H) 04/02/2020   LYMPHSABS 2.1 03/30/2020   MONOABS 1.1 (H) 03/30/2020   EOSABS 0.4 03/30/2020   BASOSABS 0.1 03/30/2020     Last metabolic panel Lab Results  Component Value  Date   NA 138 04/02/2020   K 4.0 04/02/2020   CL 106 04/02/2020   CO2 24 04/02/2020   BUN 13 04/02/2020   CREATININE 0.97 04/02/2020   GLUCOSE 131 (H) 04/02/2020   GFRNONAA >60 04/02/2020   GFRAA 104 02/14/2020   CALCIUM 9.1 04/02/2020   PHOS 3.8 03/30/2020   PROT 8.1 04/02/2020   ALBUMIN 3.8 04/02/2020   LABGLOB 4.6 (H) 07/22/2019   AGRATIO 0.7 (L) 07/22/2019   BILITOT 1.7 (H) 04/02/2020   ALKPHOS 152 (H) 04/02/2020   AST 30 04/02/2020   ALT 17 04/02/2020   ANIONGAP 8 04/02/2020    CBG (last 3)  No results for input(s): GLUCAP in the last 72 hours.   GFR: Estimated Creatinine Clearance: 107.5 mL/min (by C-G formula based on SCr of 0.97 mg/dL).  Coagulation Profile: Recent Labs  Lab 04/01/20 0551  INR 1.2    Recent Results (from the past 240 hour(s))  SARS CORONAVIRUS 2 (TAT 6-24 HRS) Nasopharyngeal Nasopharyngeal Swab     Status: None   Collection Time: 03/31/20  1:32 AM   Specimen: Nasopharyngeal Swab  Result Value Ref Range Status   SARS Coronavirus 2 NEGATIVE NEGATIVE Final    Comment: (NOTE) SARS-CoV-2 target nucleic acids are NOT DETECTED.  The SARS-CoV-2 RNA is generally detectable in upper and lower respiratory specimens during the acute phase of infection. Negative results do not preclude SARS-CoV-2 infection, do not rule out co-infections with other pathogens, and should not be used as the sole basis for treatment or other patient management decisions. Negative results must be combined with clinical observations, patient history, and epidemiological information. The expected result is Negative.  Fact Sheet for Patients: HairSlick.no  Fact Sheet for Healthcare Providers: quierodirigir.com  This test is not yet approved or cleared by the [place] FDA and  has been authorized for detection and/or diagnosis of SARS-CoV-2 by FDA under an Emergency Use Authorization (EUA). This EUA will remain   in effect (meaning this test can be used) for the duration of the COVID-19 declaration under Se ction 564(b)(1) of the Act, 21 U.S.C. section 360bbb-3(b)(1), unless the authorization is terminated or revoked sooner.  Performed at [facility], [address], [place], [place] [address]         Radiology Studies: No results found.      Scheduled Meds: . bictegravir-emtricitabine-tenofovir AF  1 tablet Oral Daily  . folic acid  1 mg Oral Daily  . multivitamin with minerals  1 tablet Oral Daily  . nicotine  21 mg Transdermal Daily  . [START ON 04/03/2020] pantoprazole  40 mg Intravenous Q12H  . thiamine  100 mg Oral Daily   Or  . thiamine  100 mg Intravenous Daily   Continuous Infusions: . octreotide  (SANDOSTATIN)    IV infusion 50 mcg/hr (04/02/20 0823)  . pantoprozole (PROTONIX) infusion 8 mg/hr (04/02/20 0305)     LOS: 2 days     Jacquelin Hawking, MD [facility] 04/02/2020, 9:03 AM  If 7PM-7AM, please contact night-coverage [email or web address]

## 2020-04-02 NOTE — Progress Notes (Signed)
Placed 5 bands on 2 esophageal varices. Last band placed @ 35 on scope. Banding kit lot#  [id number].

## 2020-04-02 NOTE — Anesthesia Preprocedure Evaluation (Signed)
Anesthesia Evaluation  Patient identified by MRN, date of birth, ID band Patient awake    Reviewed: Allergy & Precautions, NPO status , Patient's Chart, lab work & pertinent test results  Airway Mallampati: I       Dental  (+) Poor Dentition   Pulmonary Current Smoker and Patient abstained from smoking.,    Pulmonary exam normal        Cardiovascular negative cardio ROS Normal cardiovascular exam     Neuro/Psych Seizures -,  PSYCHIATRIC DISORDERS Anxiety Depression    GI/Hepatic GERD  Medicated,(+) Cirrhosis       ,   Endo/Other  negative endocrine ROS  Renal/GU negative Renal ROS  negative genitourinary   Musculoskeletal negative musculoskeletal ROS (+)   Abdominal Normal abdominal exam  (+)   Peds  Hematology  (+) anemia ,   Anesthesia Other Findings   Reproductive/Obstetrics                             Anesthesia Physical Anesthesia Plan  ASA: III  Anesthesia Plan: MAC   Post-op Pain Management:    Induction:   PONV Risk Score and Plan: 0 and Propofol infusion and TIVA  Airway Management Planned: Natural Airway and Mask  Additional Equipment: None  Intra-op Plan:   Post-operative Plan:   Informed Consent: I have reviewed the patients History and Physical, chart, labs and discussed the procedure including the risks, benefits and alternatives for the proposed anesthesia with the patient or authorized representative who has indicated his/her understanding and acceptance.     Dental advisory given  Plan Discussed with: CRNA  Anesthesia Plan Comments:         Anesthesia Quick Evaluation

## 2020-04-02 NOTE — Anesthesia Postprocedure Evaluation (Signed)
Anesthesia Post Note  Patient: Thomas Atkins  Procedure(s) Performed: ESOPHAGOGASTRODUODENOSCOPY (EGD) WITH PROPOFOL (Left ) ESOPHAGEAL BANDING     Patient location during evaluation: Endoscopy Anesthesia Type: MAC Level of consciousness: awake and alert Pain management: pain level controlled Vital Signs Assessment: post-procedure vital signs reviewed and stable Respiratory status: spontaneous breathing, nonlabored ventilation, respiratory function stable and patient connected to nasal cannula oxygen Cardiovascular status: blood pressure returned to baseline and stable Postop Assessment: no apparent nausea or vomiting Anesthetic complications: no   No complications documented.  Last Vitals:  Vitals:   04/02/20 0929 04/02/20 1008  BP: 112/74 (!) 141/95  Pulse: 66 75  Resp: 19 (!) 26  Temp: 36.7 C 36.7 C  SpO2: 97% 100%    Last Pain:  Vitals:   04/02/20 1008  TempSrc: Axillary  PainSc:                  Barnet Glasgow

## 2020-04-02 NOTE — Interval H&P Note (Signed)
History and Physical Interval Note: 44/male with significant alcohol use, portal hypertension, iron deficiency anemia and black stools for EGD with possible banding with propofol.  04/02/2020 9:44 AM  Thomas Atkins  has presented today for EGD with possible banding, with the diagnosis of abdominal pain, melena, anemia, cirrhosis.  The various methods of treatment have been discussed with the patient and family. After consideration of risks, benefits and other options for treatment, the patient has consented to  Procedure(s): ESOPHAGOGASTRODUODENOSCOPY (EGD) WITH PROPOFOL (Left) as a surgical intervention.  The patient's history has been reviewed, patient examined, no change in status, stable for surgery.  I have reviewed the patient's chart and labs.  Questions were answered to the patient's satisfaction.     Kerin Salen

## 2020-04-02 NOTE — Transfer of Care (Signed)
Immediate Anesthesia Transfer of Care Note  Patient: Thomas Atkins  Procedure(s) Performed: ESOPHAGOGASTRODUODENOSCOPY (EGD) WITH PROPOFOL (Left ) ESOPHAGEAL BANDING  Patient Location: Endoscopy Unit  Anesthesia Type:MAC  Level of Consciousness: drowsy  Airway & Oxygen Therapy: Patient Spontanous Breathing and Patient connected to face mask oxygen  Post-op Assessment: Report given to RN and Post -op Vital signs reviewed and stable  Post vital signs: Reviewed and stable  Last Vitals:  Vitals Value Taken Time  BP 141/95 1008  Temp    Pulse 72   Resp    SpO2 100     Last Pain:  Vitals:   04/02/20 0929  TempSrc: Oral  PainSc: 9       Patients Stated Pain Goal: 3 (81/44/81 8563)  Complications: No complications documented.

## 2020-04-02 NOTE — Brief Op Note (Signed)
03/30/2020 - 04/02/2020  10:15 AM  PATIENT:  Thomas Atkins  45 y.o. male  PRE-OPERATIVE DIAGNOSIS:  abdominal pain, melena, anemia, cirrhosis  POST-OPERATIVE DIAGNOSIS:  5 bands placed on 2 on esophageal varices  PROCEDURE:  Procedure(s): ESOPHAGOGASTRODUODENOSCOPY (EGD) WITH PROPOFOL (Left) ESOPHAGEAL BANDING  SURGEON:  Surgeon(s) and Role:    Ronnette Juniper, MD - Primary  PHYSICIAN ASSISTANT:   ASSISTANTS: Lajean Silvius Proctor,Tech  ANESTHESIA:  MAC  EBL:  Minimal  BLOOD ADMINISTERED:none  DRAINS: none   LOCAL MEDICATIONS USED:  NONE  SPECIMEN:  No Specimen  DISPOSITION OF SPECIMEN:  N/A  COUNTS:  YES  TOURNIQUET:  * No tourniquets in log *  DICTATION: .Dragon Dictation  PLAN OF CARE: Admit to inpatient   PATIENT DISPOSITION:  PACU - hemodynamically stable.   Delay start of Pharmacological VTE agent (>24hrs) due to surgical blood loss or risk of bleeding: no

## 2020-04-02 NOTE — Op Note (Signed)
[facility] Patient Name: Thomas MawMark Atkins Procedure Date: 04/02/2020 MRN: [id number] Attending MD: Kerin SalenArya Lizandro Spellman , MD Date of Birth: 1975-08-29 CSN: [id number] Age: 45 Admit Type: Outpatient Procedure:                Upper GI endoscopy Indications:              Melena, Suspected upper gastrointestinal bleeding,                            Cirrhosis with suspected esophageal varices Providers:                Kerin SalenArya Lakisa Lotz, MD, Rogue JurySandy Andrews, RN, Brion AlimentShayla Proctor,                            Technician Referring MD:             [facility], PCP- Maretta Loseborah Johnson,MD Medicines:                Monitored Anesthesia Care Complications:            No immediate complications. Estimated blood loss:                            Minimal. Estimated Blood Loss:     Estimated blood loss was minimal. Procedure:                Pre-Anesthesia Assessment:                           - Prior to the procedure, a History and Physical                            was performed, and patient medications and                            allergies were reviewed. The patient's tolerance of                            previous anesthesia was also reviewed. The risks                            and benefits of the procedure and the sedation                            options and risks were discussed with the patient.                            All questions were answered, and informed consent                            was obtained. Prior Anticoagulants: The patient has                            taken no previous anticoagulant or antiplatelet  agents. ASA Grade Assessment: III - A patient with                            severe systemic disease. After reviewing the risks                            and benefits, the patient was deemed in                            satisfactory condition to undergo the procedure.                           After obtaining informed consent, the endoscope was                             passed under direct vision. Throughout the                            procedure, the patient's blood pressure, pulse, and                            oxygen saturations were monitored continuously. The                            [id number]-[id number] ([id number]) was introduced through the                            mouth, and advanced to the second part of duodenum.                            The upper GI endoscopy was accomplished without                            difficulty. The patient tolerated the procedure                            well. Scope In: Scope Out: Findings:      Grade II varices were found in the lower third of the esophagus. They       were medium in size. Five bands were successfully placed with complete       eradication, resulting in deflation of varices. There was no bleeding       during and at the end of the procedure.      The Z-line was regular.      A large amount of food (residue) was found in the gastric body.      Severe portal hypertensive gastropathy was found in the entire examined       stomach. Friability and self limited oozing was noted during passage of       scope due underlying severe portal hypertensive gastropathy.      The cardia and gastric fundus were normal on retroflexion. Evaluation       for gastric varices was limited due to presence of food residue.      The examined duodenum was normal. Impression:               -  Grade II esophageal varices. Completely                            eradicated. Banded.                           - Z-line regular.                           - A large amount of food (residue) in the stomach.                           - Portal hypertensive gastropathy.                           - Normal examined duodenum.                           - No specimens collected. Moderate Sedation:      Patient did not receive moderate sedation for this procedure, but       instead received monitored anesthesia  care. Recommendation:           - Clear liquid diet today.                           - Continue present medications. Change protonix to                            40 mg IV BID and continue octreotide drip for today                            with the intention to stop tomorrow. Procedure Code(s):        --- Professional ---                           [phone], Esophagogastroduodenoscopy, flexible,                            transoral; with band ligation of esophageal/gastric                            varices Diagnosis Code(s):        --- Professional ---                           K74.60, Unspecified cirrhosis of liver                           I85.10, Secondary esophageal varices without                            bleeding                           K76.6, Portal hypertension                           K31.89, Other diseases of  stomach and duodenum                           K92.1, Melena (includes Hematochezia) CPT copyright 2019 American Medical Association. All rights reserved. The codes documented in this report are preliminary and upon coder review may  be revised to meet current compliance requirements. Kerin Salen, MD 04/02/2020 10:14:57 AM This report has been signed electronically. Number of Addenda: 0

## 2020-04-03 ENCOUNTER — Other Ambulatory Visit (HOSPITAL_COMMUNITY): Payer: Self-pay | Admitting: Family Medicine

## 2020-04-03 DIAGNOSIS — I8500 Esophageal varices without bleeding: Secondary | ICD-10-CM

## 2020-04-03 DIAGNOSIS — I8511 Secondary esophageal varices with bleeding: Secondary | ICD-10-CM

## 2020-04-03 LAB — CBC
HCT: 26.1 % — ABNORMAL LOW (ref 39.0–52.0)
Hemoglobin: 7.9 g/dL — ABNORMAL LOW (ref 13.0–17.0)
MCH: 23.7 pg — ABNORMAL LOW (ref 26.0–34.0)
MCHC: 30.3 g/dL (ref 30.0–36.0)
MCV: 78.4 fL — ABNORMAL LOW (ref 80.0–100.0)
Platelets: 179 10*3/uL (ref 150–400)
RBC: 3.33 MIL/uL — ABNORMAL LOW (ref 4.22–5.81)
RDW: 18.7 % — ABNORMAL HIGH (ref 11.5–15.5)
WBC: 5.7 10*3/uL (ref 4.0–10.5)
nRBC: 0 % (ref 0.0–0.2)

## 2020-04-03 MED ORDER — FUROSEMIDE 20 MG PO TABS: 20.0000 mg | ORAL_TABLET | Freq: Every day | ORAL | 0 refills | Status: DC

## 2020-04-03 MED ORDER — PANTOPRAZOLE SODIUM 40 MG PO TBEC
40.0000 mg | DELAYED_RELEASE_TABLET | Freq: Every day | ORAL | Status: DC
  Administered 2020-04-03: 40 mg via ORAL
  Filled 2020-04-03: qty 1

## 2020-04-03 MED ORDER — SPIRONOLACTONE 50 MG PO TABS: 50.0000 mg | ORAL_TABLET | Freq: Every day | ORAL | 0 refills | Status: DC

## 2020-04-03 MED ORDER — HYDROXYZINE HCL 25 MG PO TABS
25.0000 mg | ORAL_TABLET | Freq: Three times a day (TID) | ORAL | Status: DC | PRN
  Administered 2020-04-03: 25 mg via ORAL
  Filled 2020-04-03: qty 1

## 2020-04-03 MED ORDER — PROPRANOLOL HCL 10 MG PO TABS
10.0000 mg | ORAL_TABLET | Freq: Two times a day (BID) | ORAL | Status: DC
  Administered 2020-04-03: 10 mg via ORAL
  Filled 2020-04-03: qty 1

## 2020-04-03 MED ORDER — FOLIC ACID 1 MG PO TABS: 1.0000 mg | ORAL_TABLET | Freq: Every day | ORAL | Status: DC

## 2020-04-03 MED ORDER — PANTOPRAZOLE SODIUM 40 MG PO TBEC: 40.0000 mg | DELAYED_RELEASE_TABLET | Freq: Every day | ORAL | 0 refills | Status: DC

## 2020-04-03 MED ORDER — PROPRANOLOL HCL 10 MG PO TABS: 10.0000 mg | ORAL_TABLET | Freq: Two times a day (BID) | ORAL | 0 refills | Status: DC

## 2020-04-03 MED ORDER — SODIUM CHLORIDE 0.9 % IV SOLN
510.0000 mg | Freq: Once | INTRAVENOUS | Status: AC
  Administered 2020-04-03: 510 mg via INTRAVENOUS
  Filled 2020-04-03: qty 17
  Filled 2020-04-03: qty 510

## 2020-04-03 MED ORDER — HYDROXYZINE HCL 25 MG PO TABS: 25.0000 mg | ORAL_TABLET | Freq: Three times a day (TID) | ORAL | 0 refills | Status: DC | PRN

## 2020-04-03 MED FILL — PROPRANOLOL 10 MG TABLET: 10 | 30 days supply | Qty: 60 | Fill #0

## 2020-04-03 MED FILL — PANTOPRAZOLE SOD DR 40 MG T: 40 | 30 days supply | Qty: 30 | Fill #0

## 2020-04-03 MED FILL — hydrOXYzine HCL 25 MG TABS: 25 | 10 days supply | Qty: 30 | Fill #0

## 2020-04-03 MED FILL — SPIRONOLACTONE 50 MG TABS: 50 | 30 days supply | Qty: 30 | Fill #0

## 2020-04-03 MED FILL — FUROSEMIDE 20 MG TABS: 20 | 30 days supply | Qty: 30 | Fill #0

## 2020-04-03 NOTE — Progress Notes (Signed)
Subjective: Patient was sleeping when I entered the room. After waking him up, patient states that he has been experiencing heartburn and acid reflux. He wants to eat and go home. Reports not having a bowel movement for 5 days.  Objective: Vital signs in last 24 hours: Temp:  [97.5 F (36.4 C)-98.8 F (37.1 C)] 98.8 F (37.1 C) (01/24 2019) Pulse Rate:  [60-75] 70 (01/24 2019) Resp:  [16-26] 19 (01/24 2019) BP: (109-142)/(76-103) 109/76 (01/24 2019) SpO2:  [95 %-100 %] 96 % (01/24 2019) Weight change:  Last BM Date: 04/01/20  PE: No obvious icterus, mild pallor GENERAL: Alert, oriented x3 ABDOMEN: Nondistended, nontender, normoactive bowel sounds, mild ascites EXTREMITIES: No edema  Lab Results: Results for orders placed or performed during the hospital encounter of 03/30/20 (from the past 48 hour(s))  CBC     Status: Abnormal   Collection Time: 04/02/20  5:33 AM  Result Value Ref Range   WBC 6.0 4.0 - 10.5 K/uL   RBC 3.30 (L) 4.22 - 5.81 MIL/uL   Hemoglobin 7.7 (L) 13.0 - 17.0 g/dL   HCT 17.6 (L) 16.0 - 73.7 %   MCV 79.1 (L) 80.0 - 100.0 fL   MCH 23.3 (L) 26.0 - 34.0 pg   MCHC 29.5 (L) 30.0 - 36.0 g/dL   RDW 10.6 (H) 26.9 - 48.5 %   Platelets 169 150 - 400 K/uL   nRBC 0.0 0.0 - 0.2 %    Comment: Performed at [facility], [address] [place], [place] [address]  Comprehensive metabolic panel     Status: Abnormal   Collection Time: 04/02/20  5:33 AM  Result Value Ref Range   Sodium 138 135 - 145 mmol/L   Potassium 4.0 3.5 - 5.1 mmol/L   Chloride 106 98 - 111 mmol/L   CO2 24 22 - 32 mmol/L   Glucose, Bld 131 (H) 70 - 99 mg/dL    Comment: Glucose reference range applies only to samples taken after fasting for at least 8 hours.   BUN 13 6 - 20 mg/dL   Creatinine, Ser 3.50 0.61 - 1.24 mg/dL   Calcium 9.1 8.9 - 09.3 mg/dL   Total Protein 8.1 6.5 - 8.1 g/dL   Albumin 3.8 3.5 - 5.0 g/dL   AST 30 15 - 41 U/L   ALT 17 0 - 44 U/L   Alkaline  Phosphatase 152 (H) 38 - 126 U/L   Total Bilirubin 1.7 (H) 0.3 - 1.2 mg/dL   GFR, Estimated >81 >82 mL/min    Comment: (NOTE) Calculated using the CKD-EPI Creatinine Equation (2021)    Anion gap 8 5 - 15    Comment: Performed at [facility], [address] [place], [place] [address]  CBC     Status: Abnormal   Collection Time: 04/03/20  5:41 AM  Result Value Ref Range   WBC 5.7 4.0 - 10.5 K/uL   RBC 3.33 (L) 4.22 - 5.81 MIL/uL   Hemoglobin 7.9 (L) 13.0 - 17.0 g/dL   HCT 69.6 (L) 78.9 - 38.1 %   MCV 78.4 (L) 80.0 - 100.0 fL   MCH 23.7 (L) 26.0 - 34.0 pg   MCHC 30.3 30.0 - 36.0 g/dL   RDW 01.7 (H) 51.0 - 25.8 %   Platelets 179 150 - 400 K/uL   nRBC 0.0 0.0 - 0.2 %    Comment: Performed at [facility], [address] [place], [place] [address]    Studies/Results:  No results found.  Medications: I have reviewed the patient's current medications.  Assessment: Cirrhosis, portal hypertension, mild ascites, no encephalopathy Anemia, hemoglobin 6.7 on presentation Status post EGD which showed grade 2 esophageal varices, 5 bands placed, severe portal hypertensive gastropathy with friability and oozing, retained food in the stomach Hemoglobin stable at 7.9 MELD sodium score around 10 History of HIV  Plan: Start regular diet DC IV octreotide We will start on propanolol 10 mg twice daily(last documented blood pressure 109/76, heart rate 70) DC IV Protonix, continue Protonix 40 mg p.o. daily indefinitely Continue spironolactone 50 mg daily, along with furosemide 20 mg daily Continue folic acid 1 mg, multivitamin 1 tablet daily, thiamine 100 mg daily for another 90 days on discharge.  I had a detailed discussion with the patient that he needs to stop drinking alcohol. Patient to follow-up with Dr. Dulce Sellar on discharge in the next 4 to 6 weeks, ideally will need repeat EGD for banding of esophageal varices, however, most important part is  abstinence from alcohol use.  Okay to DC home later today afternoon. Please recall GI if needed.  Kerin Salen, MD 04/03/2020, 9:57 AM

## 2020-04-03 NOTE — Discharge Summary (Signed)
Physician Discharge Summary  Thomas Atkins MGN:[id number] DOB: [birth year] DOA: 03/30/2020  PCP: [name], MD  Admit date: 03/30/2020 Discharge date: 04/03/2020  Admitted From: Home Disposition: Home  Recommendations for Outpatient Follow-up:  1. Follow up with PCP in 1 week 2. Please obtain BMP/CBC in one week 3. Please follow up on the following pending results: None  Home Health: None Equipment/Devices: None  Discharge Condition: Stable CODE STATUS: Full code Diet recommendation: Low sodium   Brief/Interim Summary:  Admission HPI written by Hillary Bow, DO   Chief Complaint: Abd pain  HPI: Thomas Atkins is a 46 y.o. male with medical history significant of EtOH abuse, cirrhosis with ascites, HIV.  Pt previously required frequent paracentesis but hasnt needed to have one since June 21, he attributes this to being complaint with diuretics.  Today he had acute onset of abd pain a few hours PTA to the ED.  Pain has been constant, diffuse, radiating to B flanks.  Associated reduced urine output.  Says that his "kidneys hurt".  Pain worse with movement and palpation.  Associated NBNB emesis x5 episodes.  Also had some black stools today.  Did drink beer today.  No fever, abd not more distended than baseline.   Hospital course:  Coffee ground emesis Multiple episodes the day prior to admission. GI consulted on admission. Patient started on Protonix IV, octreotide IV. Fecal occult positive stool. EGD performed on 1/24 and significant for Grade II esophageal varices which were eradicated/banded in addition to portal hypertension gastropathy. Recommendations for Protonix daily.  Acute blood loss anemia Secondary to GI bleeding received 1 unit of PRBC. Hemoglobin stable.  Abdominal pain Patient has had this pain for over one year. Unlikely SBP. No mental status changes. No leukocytosis. Likely related to above and likely underlying gastritis. Ceftriaxone  discontinued.   Alcohol abuse Patient drinks about 6 12oz bottles of beer per day. Patient has had alcohol withdrawal in the past with no prior ICU admissions but has had alcohol withdrawal seizures as an outpatient. Patient does not have any desire to quit drinking alcohol at this time; risks of continued alcohol consumption explained at bedside. CIWA scores improved.  Esophageal varices Propranolol on discharge.  Cirrhosis with ascites Total bilirubin of 1.6 and stable. No significant ascites on CT abdomen/pelvis. INR of 1.2. Continue Lasix and spironolactone.  HIV infection Last CD4 count of 1145 and viral load of 73 from 02/2020. Continue Biktarvy  Discharge Diagnoses:  Principal Problem:   GIB (gastrointestinal bleeding) Active Problems:   Alcohol use disorder, moderate, dependence (HCC)   HIV (human immunodeficiency virus infection) (HCC)   Abdominal pain   Alcoholic cirrhosis of liver with ascites (HCC)   Acute blood loss anemia    Discharge Instructions   Allergies as of 04/03/2020      Reactions   Bee Venom Anaphylaxis   Lactose Intolerance (gi) Diarrhea, Nausea Only, Other (See Comments)   Abdominal pain     Ultram [tramadol Hcl] Hives         Medication List    TAKE these medications   BC HEADACHE POWDER PO Take 1 packet by mouth 2 (two) times daily as needed (pain/headache).   bictegravir-emtricitabine-tenofovir AF 50-200-25 MG Tabs tablet Commonly known as: BIKTARVY Take 1 tablet by mouth daily.   EX-LAX PO Take 1 tablet by mouth daily as needed (constipation).   folic acid 1 MG tablet Commonly known as: FOLVITE Take 1 tablet (1 mg total) by mouth  daily. Start taking on: April 04, 2020   furosemide 20 MG tablet Commonly known as: LASIX Take 1 tablet (20 mg total) by mouth daily.   hydrOXYzine 25 MG tablet Commonly known as: ATARAX/VISTARIL Take 1 tablet (25 mg total) by mouth 3 (three) times daily as needed for anxiety.   multivitamin  with minerals Tabs tablet Take 1 tablet by mouth daily.   pantoprazole 40 MG tablet Commonly known as: PROTONIX Take 1 tablet (40 mg total) by mouth daily.   PEPTO-BISMOL PO Take 1 tablet by mouth daily as needed (heartburn/indigestion).   propranolol 10 MG tablet Commonly known as: INDERAL Take 1 tablet (10 mg total) by mouth 2 (two) times daily.   spironolactone 50 MG tablet Commonly known as: ALDACTONE Take 1 tablet (50 mg total) by mouth daily.   tamsulosin 0.4 MG Caps capsule Commonly known as: FLOMAX TAKE 1 CAPSULE (0.4 MG TOTAL) BY MOUTH DAILY.   thiamine 100 MG tablet Take 1 tablet (100 mg total) by mouth daily.   traZODone 100 MG tablet Commonly known as: DESYREL Take 1.5 tablets (150 mg total) by mouth at bedtime as needed for sleep.       Allergies  Allergen Reactions  . Bee Venom Anaphylaxis  . Lactose Intolerance (Gi) Diarrhea, Nausea Only and Other (See Comments)    Abdominal pain    . Ultram [Tramadol Hcl] Hives         Consultations:  Gastroenterology   Procedures/Studies: DG Chest 2 View  Result Date: 03/30/2020 CLINICAL DATA:  Dyspnea EXAM: CHEST - 2 VIEW COMPARISON:  10/11/2019 FINDINGS: Lungs are well expanded, symmetric, and clear. No pneumothorax or pleural effusion. Cardiac size within normal limits. Pulmonary vascularity is normal. Osseous structures are age-appropriate. No acute bone abnormality. IMPRESSION: No active cardiopulmonary disease. Electronically Signed   By: Helyn NumbersAshesh  Parikh MD   On: 03/30/2020 23:48   CT ABDOMEN PELVIS W CONTRAST  Result Date: 03/31/2020 CLINICAL DATA:  Acute nonlocalized abdominal pain. Nausea, vomiting. EXAM: CT ABDOMEN AND PELVIS WITH CONTRAST TECHNIQUE: Multidetector CT imaging of the abdomen and pelvis was performed using the standard protocol following bolus administration of intravenous contrast. CONTRAST:  100mL OMNIPAQUE IOHEXOL 300 MG/ML  SOLN COMPARISON:  10/11/2019 FINDINGS: Lower chest: The  visualized lung bases are clear. Pectus excavatum deformity partially visualized. Cardiac size within normal limits. Hepatobiliary: Mild hepatomegaly is again seen. Heterogeneous enhancement of the liver but noted on prior examination is not well appreciated on this examination; no focal intrahepatic masses are identified. Recanalization of the umbilical vein and tiny gastroesophageal varices are again identified in keeping with changes of portal venous hypertension. No intra or extrahepatic biliary ductal dilation. Portal vein is patent. Gallbladder is unremarkable. There is mild periportal edema identified, similar to that noted on prior examination. Pancreas: Unremarkable. Spleen: Mild splenomegaly is stable. No intrasplenic lesions are identified. The splenic vein is patent. Adrenals/Urinary Tract: The adrenal glands are unremarkable. The kidneys are normal in size and position. Preserved cortical thickness and symmetric cortical enhancement. No hydronephrosis. No intrarenal or ureteral calculi. Tiny probable cortical cyst within the right kidney. The bladder is unremarkable. Stomach/Bowel: Trace ascites again noted. This appears improved since prior examination. The stomach, small bowel, and large bowel are otherwise unremarkable. Appendix normal. No free intraperitoneal gas. Vascular/Lymphatic: No pathologic adenopathy within the abdomen and pelvis. The abdominal vasculature is age-appropriate with mild aortoiliac atherosclerotic calcification. No aortic aneurysm. Reproductive: Prostate is unremarkable. Other: Retroperitoneal edema is again identified, similar to that noted on prior  examination. Musculoskeletal: No acute bone abnormality. No lytic or blastic bone lesion identified. IMPRESSION: Stable hepatosplenomegaly. Recanalization of the umbilical vein and small gastroesophageal varices in keeping with changes of portal venous hypertension. Mild ascites, slightly improved, and retroperitoneal edema,  stable. This may reflect changes of portal venous hypertension, but is nonspecific. Aortic Atherosclerosis (ICD10-I70.0). Electronically Signed   By: Helyn Numbers MD   On: 03/31/2020 00:13      UPPER ENDOSCOPY (04/02/2020) Impression:               - Grade II esophageal varices. Completely                            eradicated. Banded.                           - Z-line regular.                           - A large amount of food (residue) in the stomach.                           - Portal hypertensive gastropathy.                           - Normal examined duodenum.                           - No specimens collected.  Recommendation:           - Clear liquid diet today.                           - Continue present medications. Change protonix to                            40 mg IV BID and continue octreotide drip for today                            with the intention to stop tomorrow.   Subjective: No issues overnight.  Discharge Exam: Vitals:   04/02/20 2019 04/03/20 1018  BP:  115/82  Pulse: 70 68  Resp: 19   Temp: 98.8 F (37.1 C)   SpO2: 96%    Vitals:   04/02/20 1111 04/02/20 1307 04/02/20 2019 04/03/20 1018  BP: 138/83 132/85 109/76 115/82  Pulse: 60 62 70 68  Resp: 18  19   Temp: (!) 97.5 F (36.4 C) 97.8 F (36.6 C) 98.8 F (37.1 C)   TempSrc: Oral Oral Oral   SpO2: 99% 95% 96%   Weight:      Height:        General: Pt is alert, awake, not in acute distress Cardiovascular: RRR, S1/S2 +, no rubs, no gallops Respiratory: CTA bilaterally, no wheezing, no rhonchi Abdominal: Soft, NT, ND, bowel sounds + Extremities: no edema, no cyanosis    The results of significant diagnostics from this hospitalization (including imaging, microbiology, ancillary and laboratory) are listed below for reference.     Microbiology: Recent Results (from the past 240 hour(s))  SARS CORONAVIRUS 2 (TAT 6-24 HRS) Nasopharyngeal Nasopharyngeal Swab  Status: None    Collection Time: 03/31/20  1:32 AM   Specimen: Nasopharyngeal Swab  Result Value Ref Range Status   SARS Coronavirus 2 NEGATIVE NEGATIVE Final    Comment: (NOTE) SARS-CoV-2 target nucleic acids are NOT DETECTED.  The SARS-CoV-2 RNA is generally detectable in upper and lower respiratory specimens during the acute phase of infection. Negative results do not preclude SARS-CoV-2 infection, do not rule out co-infections with other pathogens, and should not be used as the sole basis for treatment or other patient management decisions. Negative results must be combined with clinical observations, patient history, and epidemiological information. The expected result is Negative.  Fact Sheet for Patients: HairSlick.no  Fact Sheet for Healthcare Providers: quierodirigir.com  This test is not yet approved or cleared by the [place] FDA and  has been authorized for detection and/or diagnosis of SARS-CoV-2 by FDA under an Emergency Use Authorization (EUA). This EUA will remain  in effect (meaning this test can be used) for the duration of the COVID-19 declaration under Se ction 564(b)(1) of the Act, 21 U.S.C. section 360bbb-3(b)(1), unless the authorization is terminated or revoked sooner.  Performed at [facility], [address], [place], [place] [address]      Labs: BNP (last 3 results) Recent Labs    10/11/19 0327  BNP 37.2   Basic Metabolic Panel: Recent Labs  Lab 03/30/20 2246 03/31/20 0717 04/02/20 0533  NA 134* 136 138  K 3.6 4.4 4.0  CL 101 103 106  CO2 20* 20* 24  GLUCOSE 102* 105* 131*  BUN 8 8 13   CREATININE 0.90 1.03 0.97  CALCIUM 9.3 9.0 9.1  MG 2.0  --   --   PHOS 3.8  --   --    Liver Function Tests: Recent Labs  Lab 03/30/20 2246 03/31/20 0717 04/02/20 0533  AST 38 33 30  ALT 23 20 17   ALKPHOS 157* 147* 152*  BILITOT 1.4* 1.6* 1.7*  PROT 8.7* 8.2* 8.1  ALBUMIN 4.1 4.0 3.8    Recent Labs  Lab 03/30/20 2246  LIPASE 32   No results for input(s): AMMONIA in the last 168 hours. CBC: Recent Labs  Lab 03/30/20 2246 03/31/20 0717 03/31/20 0852 04/01/20 0551 04/02/20 0533 04/03/20 0541  WBC 12.6* 8.5 7.7 6.3 6.0 5.7  NEUTROABS 8.9*  --   --   --   --   --   HGB 7.5* 6.7* 6.7* 7.4* 7.7* 7.9*  HCT 24.7* 22.7* 23.5* 25.0* 26.1* 26.1*  MCV 77.9* 78.8* 80.5 79.4* 79.1* 78.4*  PLT 192 152 160 162 169 179   Cardiac Enzymes: No results for input(s): CKTOTAL, CKMB, CKMBINDEX, TROPONINI in the last 168 hours. BNP: Invalid input(s): POCBNP CBG: No results for input(s): GLUCAP in the last 168 hours. D-Dimer No results for input(s): DDIMER in the last 72 hours. Hgb A1c No results for input(s): HGBA1C in the last 72 hours. Lipid Profile No results for input(s): CHOL, HDL, LDLCALC, TRIG, CHOLHDL, LDLDIRECT in the last 72 hours. Thyroid function studies No results for input(s): TSH, T4TOTAL, T3FREE, THYROIDAB in the last 72 hours.  Invalid input(s): FREET3 Anemia work up No results for input(s): VITAMINB12, FOLATE, FERRITIN, TIBC, IRON, RETICCTPCT in the last 72 hours. Urinalysis    Component Value Date/Time   COLORURINE YELLOW 03/30/2020 2246   APPEARANCEUR CLEAR 03/30/2020 2246   LABSPEC 1.033 (H) 03/30/2020 2246   PHURINE 5.0 03/30/2020 2246   GLUCOSEU NEGATIVE 03/30/2020 2246   HGBUR NEGATIVE 03/30/2020 2246  BILIRUBINUR NEGATIVE 03/30/2020 2246   KETONESUR NEGATIVE 03/30/2020 2246   PROTEINUR NEGATIVE 03/30/2020 2246   UROBILINOGEN 0.2 02/19/2013 1819   NITRITE NEGATIVE 03/30/2020 2246   LEUKOCYTESUR NEGATIVE 03/30/2020 2246   Sepsis Labs Invalid input(s): PROCALCITONIN,  WBC,  LACTICIDVEN Microbiology Recent Results (from the past 240 hour(s))  SARS CORONAVIRUS 2 (TAT 6-24 HRS) Nasopharyngeal Nasopharyngeal Swab     Status: None   Collection Time: 03/31/20  1:32 AM   Specimen: Nasopharyngeal Swab  Result Value Ref Range Status   SARS  Coronavirus 2 NEGATIVE NEGATIVE Final    Comment: (NOTE) SARS-CoV-2 target nucleic acids are NOT DETECTED.  The SARS-CoV-2 RNA is generally detectable in upper and lower respiratory specimens during the acute phase of infection. Negative results do not preclude SARS-CoV-2 infection, do not rule out co-infections with other pathogens, and should not be used as the sole basis for treatment or other patient management decisions. Negative results must be combined with clinical observations, patient history, and epidemiological information. The expected result is Negative.  Fact Sheet for Patients: HairSlick.nohttps://www.fda.gov/media/138098/download  Fact Sheet for Healthcare Providers: quierodirigir.comhttps://www.fda.gov/media/138095/download  This test is not yet approved or cleared by the [place] FDA and  has been authorized for detection and/or diagnosis of SARS-CoV-2 by FDA under an Emergency Use Authorization (EUA). This EUA will remain  in effect (meaning this test can be used) for the duration of the COVID-19 declaration under Se ction 564(b)(1) of the Act, 21 U.S.C. section 360bbb-3(b)(1), unless the authorization is terminated or revoked sooner.  Performed at [facility], [address] [place], [place] [address]      Time coordinating discharge: 35 minutes  SIGNED:   Jacquelin Hawkingalph Anely Spiewak, MD [facility] 04/03/2020, 12:37 PM

## 2020-04-03 NOTE — Progress Notes (Signed)
Went over discharge papers with patient.  All questions answered.  Iron infusion given, monitored for signs of reaction.  Vitals stable.

## 2020-04-03 NOTE — Discharge Instructions (Signed)
Thomas Atkins,  You were in the hospital because of vomiting blood. You had an EGD with treatment of bleeding vessels. Please abstain completely from alcohol. Please follow-up with your PCP and GI physician.

## 2020-04-03 NOTE — Plan of Care (Signed)
  Problem: Education: Goal: Knowledge of General Education information will improve Description: Including pain rating scale, medication(s)/side effects and non-pharmacologic comfort measures Outcome: Progressing   Problem: Activity: Goal: Risk for activity intolerance will decrease Outcome: Progressing   Problem: Elimination: Goal: Will not experience complications related to urinary retention Outcome: Progressing   Problem: Pain Managment: Goal: General experience of comfort will improve Outcome: Progressing   Problem: Safety: Goal: Ability to remain free from injury will improve Outcome: Progressing   Problem: Skin Integrity: Goal: Risk for impaired skin integrity will decrease Outcome: Progressing   

## 2020-04-04 ENCOUNTER — Encounter (HOSPITAL_COMMUNITY): Payer: Self-pay | Admitting: Gastroenterology

## 2020-04-04 ENCOUNTER — Telehealth: Payer: Self-pay

## 2020-04-04 NOTE — Telephone Encounter (Signed)
Transition Care Management Follow-up Telephone Call  Date of discharge and from where: 04/03/2020, [facility]   How have you been since you were released from the hospital? He said he is "taking it light" and recovering from the medications that he received in the hospital   Any questions or concerns? Yes -he said he has been denied medicaid twice and has been trying to apply for SSI.  He has not contacted an attorney yet.  He was agreeable to having this CM submit a referral to [employer] [place] for assistance with medicaid appeals.  Referral sent to Donavan Burnet, attorney/LANC via [facility] email   Items Reviewed:  Did the pt receive and understand the discharge instructions provided? Yes   Medications obtained and verified? he said that he has all of the medications he was taking prior to his hospitalization and needs to pick up the 3 new medications. He said that he needs to call [facility] to find out the cost of his meds that he needs to pick up.  he did not have any questions about the med regime.   Other? No   Any new allergies since your discharge? No   Do you have support at home? Yes , a roommate  Home Care and Equipment/Supplies: Were home health services ordered? no If so, what is the name of the agency? n/a  Has the agency set up a time to come to the patient's home?n/a Were any new equipment or medical supplies ordered?  No What is the name of the medical supply agency? n/a Were you able to get the supplies/equipment? n/a Do you have any questions related to the use of the equipment or supplies? No, n/a  Functional Questionnaire: (I = Independent and D = Dependent) ADLs: independent  Follow up appointments reviewed:   PCP Hospital f/u appt confirmed? Yes  Dr Laural Benes 04/12/2020  Specialist Hospital f/u appt confirmed? Yes  [facility] 06/12/2020  Are transportation arrangements needed? No   If their condition worsens, is the pt aware to call PCP or go to the  Emergency Dept.? Yes  Was the patient provided with contact information for the PCP's office or ED? He has the phone number for the clinic  Was to pt encouraged to call back with questions or concerns?yes

## 2020-04-12 ENCOUNTER — Ambulatory Visit: Payer: Self-pay | Attending: Internal Medicine | Admitting: Internal Medicine

## 2020-04-12 ENCOUNTER — Encounter: Payer: Self-pay | Admitting: Internal Medicine

## 2020-04-12 ENCOUNTER — Other Ambulatory Visit: Payer: Self-pay

## 2020-04-12 VITALS — BP 122/72 | HR 72 | Temp 98.1°F | Resp 16 | Wt 193.8 lb

## 2020-04-12 DIAGNOSIS — F17200 Nicotine dependence, unspecified, uncomplicated: Secondary | ICD-10-CM

## 2020-04-12 DIAGNOSIS — Z1331 Encounter for screening for depression: Secondary | ICD-10-CM

## 2020-04-12 DIAGNOSIS — K922 Gastrointestinal hemorrhage, unspecified: Secondary | ICD-10-CM

## 2020-04-12 DIAGNOSIS — K7031 Alcoholic cirrhosis of liver with ascites: Secondary | ICD-10-CM

## 2020-04-12 DIAGNOSIS — I8511 Secondary esophageal varices with bleeding: Secondary | ICD-10-CM

## 2020-04-12 DIAGNOSIS — Z09 Encounter for follow-up examination after completed treatment for conditions other than malignant neoplasm: Secondary | ICD-10-CM

## 2020-04-12 NOTE — Progress Notes (Signed)
Patient ID: Thomas Atkins, male    DOB: Feb 01, 1976  MRN: [id number]  CC: Transition of care Date of hospitalization: 1/21-25/2022 Date of call from case worker: 04/04/2020  Subjective: Thomas Atkins is a 45 y.o. male who presents for transition of care. His concerns today include:  Patient with history ofHTN,HIV,alcohol use disorder, alcoholic cirrhosis with ascites, insomnia,tob dep, anx/dep, ?seizure disorder.  Patient hospitalized with acute onset of abdominal pain and multiple episodes of coffee-ground emesis.  Fecal occult positive.  EGD revealed grade 2 esophageal varices and portal hypertension gastropathy.  The varices were banded.  He was transfused 1 unit of packed red blood cell and also transfused iron.  H&H at the time of discharge was 7.9/26.1.  Patient strongly advised to discontinue drinking.  He had told him that he was drinking 6 to 12 ounce beers a day.  He was continued on lisinopril and spironolactone.  Today: Patient reports that he feels much better.  His abdominal pain has resolved.  He has not had any further hematemesis.  No black stools.  He states that he has cut back some on drinking but has not quit completely.  No swelling in the legs.  Slight increased abdominal girth.  Appetite is good.  He reports compliance with taking spironolactone, furosemide and propranolol.  He is trying to quit smoking.  He smokes about a half a pack a day.   Patient Active Problem List   Diagnosis Date Noted  . Esophageal varices (HCC) 04/03/2020  . Acute blood loss anemia 03/31/2020  . GERD without esophagitis 10/11/2019  . BPH without obstruction/lower urinary tract symptoms 10/11/2019  . Abdominal pain 08/27/2019  . Alcoholic cirrhosis of liver with ascites (HCC) 08/27/2019  . Healthcare maintenance 08/03/2019  . Rash and nonspecific skin eruption 07/22/2019  . Constipation 06/16/2019  . HIV (human immunodeficiency virus infection) (HCC) 06/16/2019  . GIB  (gastrointestinal bleeding) 05/26/2019  . History of hypertension 07/31/2017  . Alcohol use disorder, moderate, dependence (HCC) 07/31/2017  . Nicotine dependence, cigarettes, uncomplicated 07/31/2017  . Alcohol-induced insomnia (HCC) 07/31/2017  . Major depressive disorder, recurrent severe without psychotic features (HCC) 10/10/2015  . Alcohol abuse with alcohol-induced mood disorder (HCC) 05/31/2015  . Alcohol abuse 02/16/2013     Current Outpatient Medications on File Prior to Visit  Medication Sig Dispense Refill  . Aspirin-Salicylamide-Caffeine (BC HEADACHE POWDER PO) Take 1 packet by mouth 2 (two) times daily as needed (pain/headache).    . bictegravir-emtricitabine-tenofovir AF (BIKTARVY) 50-200-25 MG TABS tablet Take 1 tablet by mouth daily. 30 tablet 5  . Bismuth Subsalicylate (PEPTO-BISMOL PO) Take 1 tablet by mouth daily as needed (heartburn/indigestion).    . folic acid (FOLVITE) 1 MG tablet Take 1 tablet (1 mg total) by mouth daily.    . furosemide (LASIX) 20 MG tablet Take 1 tablet (20 mg total) by mouth daily. 90 tablet 0  . hydrOXYzine (ATARAX/VISTARIL) 25 MG tablet Take 1 tablet (25 mg total) by mouth 3 (three) times daily as needed for anxiety. 30 tablet 0  . Multiple Vitamin (MULTIVITAMIN WITH MINERALS) TABS tablet Take 1 tablet by mouth daily. 30 tablet 0  . pantoprazole (PROTONIX) 40 MG tablet Take 1 tablet (40 mg total) by mouth daily. 90 tablet 0  . propranolol (INDERAL) 10 MG tablet Take 1 tablet (10 mg total) by mouth 2 (two) times daily. 180 tablet 0  . Sennosides (EX-LAX PO) Take 1 tablet by mouth daily as needed (constipation).    Marland Kitchen spironolactone (ALDACTONE)  50 MG tablet Take 1 tablet (50 mg total) by mouth daily. 90 tablet 0  . tamsulosin (FLOMAX) 0.4 MG CAPS capsule TAKE 1 CAPSULE (0.4 MG TOTAL) BY MOUTH DAILY. 90 capsule 0  . thiamine 100 MG tablet Take 1 tablet (100 mg total) by mouth daily. (Patient not taking: No sig reported) 30 tablet 0  . traZODone  (DESYREL) 100 MG tablet Take 1.5 tablets (150 mg total) by mouth at bedtime as needed for sleep. 45 tablet 2   No current facility-administered medications on file prior to visit.    Allergies  Allergen Reactions  . Bee Venom Anaphylaxis  . Lactose Intolerance (Gi) Diarrhea, Nausea Only and Other (See Comments)    Abdominal pain    . Ultram [Tramadol Hcl] Hives         Social History   Socioeconomic History  . Marital status: Divorced    Spouse name: Not on file  . Number of children: 2  . Years of education: Not on file  . Highest education level: Not on file  Occupational History  . Occupation: Unemployed  Tobacco Use  . Smoking status: Current Every Day Smoker    Packs/day: 1.00    Years: 30.00    Pack years: 30.00    Types: Cigarettes  . Smokeless tobacco: Never Used  . Tobacco comment: Patient refused cessation materials  Vaping Use  . Vaping Use: Never used  Substance and Sexual Activity  . Alcohol use: Yes    Alcohol/week: 24.0 standard drinks    Types: 24 Cans of beer per week    Comment: 6 pack per day  . Drug use: Yes    Types: Marijuana, Cocaine    Comment: on occasion  . Sexual activity: Not on file  Other Topics Concern  . Not on file  Social History Narrative  . Not on file   Social Determinants of Health   Financial Resource Strain: Not on file  Food Insecurity: Not on file  Transportation Needs: Not on file  Physical Activity: Not on file  Stress: Not on file  Social Connections: Not on file  Intimate Partner Violence: Not on file    Family History  Problem Relation Age of Onset  . Heart disease Paternal Grandmother   . Bipolar disorder Father     Past Surgical History:  Procedure Laterality Date  . ESOPHAGEAL BANDING  04/02/2020   Procedure: ESOPHAGEAL BANDING;  Surgeon: Kerin Salen, MD;  Location: [facility];  Service: Gastroenterology;;  placed 5 bands  . ESOPHAGOGASTRODUODENOSCOPY (EGD) WITH PROPOFOL Left 04/02/2020    Procedure: ESOPHAGOGASTRODUODENOSCOPY (EGD) WITH PROPOFOL;  Surgeon: Kerin Salen, MD;  Location: [facility];  Service: Gastroenterology;  Laterality: Left;  . IR PARACENTESIS  07/01/2019  . IR PARACENTESIS  07/15/2019  . IR PARACENTESIS  07/27/2019  . IR PARACENTESIS  08/03/2019  . IR PARACENTESIS  08/10/2019  . IR PARACENTESIS  08/24/2019  . SURGERY SCROTAL / TESTICULAR      ROS: Review of Systems Negative except as stated above  PHYSICAL EXAM: BP 122/72   Pulse 72   Temp 98.1 F (36.7 C)   Resp 16   Wt 193 lb 12.8 oz (87.9 kg)   SpO2 98%   BMI 25.57 kg/m   Physical Exam General appearance - alert, well appearing, and in no distress Mental status - normal mood, behavior, speech, dress, motor activity, and thought processes Chest - clear to auscultation, no wheezes, rales or rhonchi, symmetric air entry Heart - normal rate,  regular rhythm, normal S1, S2, no murmurs, rubs, clicks or gallops Abdomen -no significant ascites appreciated at this time.   Extremities -no lower extremity edema. Depression screen [facility] 2/9 04/12/2020 11/15/2019 10/24/2019  Decreased Interest 3 1 1   Down, Depressed, Hopeless 2 1 1   PHQ - 2 Score 5 2 2   Altered sleeping 0 - 1  Tired, decreased energy 2 - 0  Change in appetite 0 - 1  Feeling bad or failure about yourself  2 - 1  Trouble concentrating 1 - 1  Moving slowly or fidgety/restless 0 - 0  Suicidal thoughts 0 - 0  PHQ-9 Score 10 - 6     CMP Latest Ref Rng & Units 04/02/2020 03/31/2020 03/30/2020  Glucose 70 - 99 mg/dL 04/04/2020) 04/02/2020) 04/01/2020)  BUN 6 - 20 mg/dL 13 8 8   Creatinine 0.61 - 1.24 mg/dL 865(H 846(N 629(B  Sodium 135 - 145 mmol/L 138 136 134(L)  Potassium 3.5 - 5.1 mmol/L 4.0 4.4 3.6  Chloride 98 - 111 mmol/L 106 103 101  CO2 22 - 32 mmol/L 24 20(L) 20(L)  Calcium 8.9 - 10.3 mg/dL 9.1 9.0 9.3  Total Protein 6.5 - 8.1 g/dL 8.1 ) 2.84)  Total Bilirubin 0.3 - 1.2 mg/dL 1.32) 4.40) 1.0(U)  Alkaline Phos 38 - 126 U/L 152(H) 147(H) 157(H)   AST 15 - 41 U/L 30 33 38  ALT 0 - 44 U/L 17 20 23    Lipid Panel     Component Value Date/Time   CHOL 167 06/08/2019 0925   TRIG 188 (H) 06/08/2019 0925   HDL 17 (L) 06/08/2019 0925   CHOLHDL 9.8 (H) 06/08/2019 0925   LDLCALC 120 (H) 06/08/2019 0925    CBC    Component Value Date/Time   WBC 5.7 04/03/2020 0541   RBC 3.33 (L) 04/03/2020 0541   HGB 7.9 (L) 04/03/2020 0541   HGB 15.7 09/01/2018 1146   HCT 26.1 (L) 04/03/2020 0541   HCT 46.2 09/01/2018 1146   PLT 179 04/03/2020 0541   PLT 262 09/01/2018 1146   MCV 78.4 (L) 04/03/2020 0541   MCV 96 09/01/2018 1146   MCH 23.7 (L) 04/03/2020 0541   MCHC 30.3 04/03/2020 0541   RDW 18.7 (H) 04/03/2020 0541   RDW 14.7 09/01/2018 1146   LYMPHSABS 2.1 03/30/2020 2246   MONOABS 1.1 (H) 03/30/2020 2246   EOSABS 0.4 03/30/2020 2246   BASOSABS 0.1 03/30/2020 2246    ASSESSMENT AND PLAN: 1. Hospital discharge follow-up 2. Upper gastrointestinal bleed 3. Secondary esophageal varices with bleeding (HCC) 4. Alcoholic cirrhosis of liver with ascites (HCC) -Strongly advised him to quit drinking.  There is no way around this. Continue current medications including furosemide, spironolactone, propranolol. - CBC   5. Tobacco dependence Advised to quit.  Patient is wanting to quit.  He states that he will continue to cut back on his own.  Encouraged him to set a quit date.  6.  Positive depression screen Will have LCSW follow-up with him.  Patient was given the opportunity to ask questions.  Patient verbalized understanding of the plan and was able to repeat key elements of the plan.   No orders of the defined types were placed in this encounter.    Requested Prescriptions    No prescriptions requested or ordered in this encounter    No follow-ups on file.  09/03/2018, MD, FACP

## 2020-04-13 LAB — CBC
Hematocrit: 32.3 % — ABNORMAL LOW (ref 37.5–51.0)
Hemoglobin: 9.8 g/dL — ABNORMAL LOW (ref 13.0–17.7)
MCH: 24.4 pg — ABNORMAL LOW (ref 26.6–33.0)
MCHC: 30.3 g/dL — ABNORMAL LOW (ref 31.5–35.7)
MCV: 81 fL (ref 79–97)
Platelets: 273 10*3/uL (ref 150–450)
RBC: 4.01 x10E6/uL — ABNORMAL LOW (ref 4.14–5.80)
RDW: 22.1 % — ABNORMAL HIGH (ref 11.6–15.4)
WBC: 10.8 10*3/uL (ref 3.4–10.8)

## 2020-04-17 ENCOUNTER — Telehealth: Payer: Self-pay

## 2020-04-17 NOTE — Telephone Encounter (Signed)
Contacted pt to go over lab results pt is aware and doesn't have any questions or concerns 

## 2020-04-20 MED FILL — TRAZODONE HCL 100 MG TABS: 100 | 30 days supply | Qty: 45 | Fill #0

## 2020-04-23 ENCOUNTER — Ambulatory Visit: Payer: Self-pay | Attending: Internal Medicine | Admitting: Licensed Clinical Social Worker

## 2020-04-23 ENCOUNTER — Other Ambulatory Visit: Payer: Self-pay

## 2020-04-23 DIAGNOSIS — F331 Major depressive disorder, recurrent, moderate: Secondary | ICD-10-CM

## 2020-04-23 NOTE — BH Specialist Note (Signed)
Integrated Behavioral Health via Telemedicine Visit  04/23/20 Thomas Atkins [id number]  Number of Integrated Behavioral Health visits: 1 Session Start time: 2:25 PM  Session End time: 2:40 PM Total time: 15  Referring Provider: Dr. Laural Benes Patient/Family location: [facility] Provider location: Office All persons participating in visit: LCSW and patient Types of Service: Individual psychotherapy  I connected with Thomas Atkins by Telephone  (Video is Caregility application) and verified that I am speaking with the correct person using two identifiers.Discussed confidentiality: Yes   I discussed the limitations of telemedicine and the availability of in person appointments.  Discussed there is a possibility of technology failure and discussed alternative modes of communication if that failure occurs.  I discussed that engaging in this telemedicine visit, they consent to the provision of behavioral healthcare and the services will be billed under their insurance.  Patient and/or legal guardian expressed understanding and consented to Telemedicine visit: Yes   Presenting Concerns: Patient and/or family reports the following symptoms/concerns: Pt reports long hx of depression noting that he "does not feel depressed, I just get down every now and then" Pt identified psychosocial stressors (couch surfing and financial strain) as triggers to these feelings. Duration of problem: Ongoing; Severity of problem: mild  Patient and/or Family's Strengths/Protective Factors: Social connections and Social and Emotional competence  Goals Addressed: Patient will: 1.  Demonstrate ability to: Increase healthy adjustment to current life circumstances and Increase adequate support systems for patient/family  Pt agreed to continue following up with support systems to assist with obtaining goals (housing, income, etc)  Progress towards Goals: Ongoing  Interventions: Interventions utilized:   Solution-Focused Strategies Standardized Assessments completed: Not Needed  Patient Response: Pt was engaged and was successful in identifying strategies to assist with management of symptoms  Assessment: Pt reports long hx of depression noting that he "does not feel depressed, I just get down every now and then" Pt identified psychosocial stressors (couch surfing and financial strain) as triggers to these feelings. LCSW provided validation and encouragement. Pt reports speaking with friends as a coping skills, denies SI/HI.   Pt reports that he is doing well completing items from his to-do list. Pt was referred to Legal Aid and has filed for SSI through [facility] with [employer]. Pt is provided additional support through [employer], who will assist pt with transportation and housing. Pt states that he feels supported in the community and will continue to follow up with clinic, should any additional needs arise. No additional concerns noted.   Plan: 1. Follow up with behavioral health clinician on : Follow up with LCSW when needed 2. Behavioral recommendations: Continue utilizing strategies discussed 3. Referral(s): Integrated [employer] (In Clinic)  I discussed the assessment and treatment plan with the patient and/or parent/guardian. They were provided an opportunity to ask questions and all were answered. They agreed with the plan and demonstrated an understanding of the instructions.   They were advised to call back or seek an in-person evaluation if the symptoms worsen or if the condition fails to improve as anticipated.  Thomas Larsson, LCSW  05/03/20 8:43 AM

## 2020-05-07 ENCOUNTER — Other Ambulatory Visit: Payer: Self-pay | Admitting: Internal Medicine

## 2020-05-07 MED FILL — hydrOXYzine HCL 25 MG TABS: 25 | 10 days supply | Qty: 30 | Fill #0

## 2020-05-07 MED FILL — PANTOPRAZOLE SOD DR 40 MG T: 40 | 30 days supply | Qty: 30 | Fill #1

## 2020-05-07 MED FILL — PROPRANOLOL 10 MG TABLET: 10 | 30 days supply | Qty: 60 | Fill #1

## 2020-05-07 NOTE — Telephone Encounter (Signed)
Requested medication (s) are due for refill today: yes  Requested medication (s) are on the active medication list: yes  Last refill:  04/03/20 #30  Future visit scheduled: yes   07/10/20  Notes to clinic:  last prescribed at hospital discharge   Requested Prescriptions  Pending Prescriptions Disp Refills   hydrOXYzine (ATARAX/VISTARIL) 25 MG tablet [Pharmacy Med Name: hydrOXYzine HCL 25 MG TABS 25 Tablet] 30 tablet 0    Sig: Take 1 tablet (25 mg total) by mouth 3 (three) times daily as needed for anxiety.      Ear, Nose, and Throat:  Antihistamines Passed - 05/07/2020  9:07 AM      Passed - Valid encounter within last 12 months    Recent Outpatient Visits           3 weeks ago Hospital discharge follow-up   [facility] [name], MD   4 months ago Alcoholic cirrhosis of liver with ascites [facility])   [facility] [name], MD   6 months ago Hospital discharge follow-up   [facility] [name], MD   7 months ago Alcoholic cirrhosis of liver with ascites [facility])    [facility] [name], MD   9 months ago Ascites of liver   [facility] [name], MD       Future Appointments             In 3 weeks Calone, Tama Headings, FNP [facility] for Infectious Disease, [facility]   In 2 months [name], MD [facility]

## 2020-05-08 ENCOUNTER — Telehealth: Payer: Self-pay | Admitting: Internal Medicine

## 2020-05-08 DIAGNOSIS — F10982 Alcohol use, unspecified with alcohol-induced sleep disorder: Secondary | ICD-10-CM

## 2020-05-08 DIAGNOSIS — R3911 Hesitancy of micturition: Secondary | ICD-10-CM

## 2020-05-08 DIAGNOSIS — R188 Other ascites: Secondary | ICD-10-CM

## 2020-05-08 NOTE — Telephone Encounter (Signed)
Returned pt call. Pt states he is needing all medications sent to [facility]

## 2020-05-08 NOTE — Telephone Encounter (Signed)
Copied from CRM [id number]. Topic: General - Other >> May 08, 2020  1:33 PM Gwenlyn Fudge wrote: Reason for CRM: PT called and is requesting to have his aid give him a call back. He states that he is needing to discuss moving his medications from office to [facility]. Please advise.

## 2020-05-09 MED ORDER — PROPRANOLOL HCL 10 MG PO TABS
10.0000 mg | ORAL_TABLET | Freq: Two times a day (BID) | ORAL | 3 refills | Status: AC
Start: 2020-05-09 — End: ?

## 2020-05-09 MED ORDER — FUROSEMIDE 20 MG PO TABS: 20.0000 mg | ORAL_TABLET | Freq: Every day | ORAL | 3 refills | Status: AC

## 2020-05-09 MED ORDER — FOLIC ACID 1 MG PO TABS: 1.0000 mg | ORAL_TABLET | Freq: Every day | ORAL | Status: AC

## 2020-05-09 MED ORDER — PANTOPRAZOLE SODIUM 40 MG PO TBEC: 40.0000 mg | DELAYED_RELEASE_TABLET | Freq: Every day | ORAL | 3 refills | Status: AC

## 2020-05-09 MED ORDER — HYDROXYZINE HCL 25 MG PO TABS: 25.0000 mg | ORAL_TABLET | Freq: Three times a day (TID) | ORAL | 3 refills | Status: AC | PRN

## 2020-05-09 MED ORDER — TRAZODONE HCL 100 MG PO TABS: 150.0000 mg | ORAL_TABLET | Freq: Every evening | ORAL | 3 refills | Status: AC | PRN

## 2020-05-09 MED ORDER — TAMSULOSIN HCL 0.4 MG PO CAPS: 0.4000 mg | ORAL_CAPSULE | Freq: Every day | ORAL | 3 refills | Status: AC

## 2020-05-09 MED ORDER — SPIRONOLACTONE 50 MG PO TABS: 50.0000 mg | ORAL_TABLET | Freq: Every day | ORAL | 3 refills | Status: AC

## 2020-05-09 NOTE — Telephone Encounter (Signed)
Meds sent to [facility] per pt's request.

## 2020-05-15 NOTE — Telephone Encounter (Signed)
Pt is calling and only 6 of his medication was sent to [facility] . I was trying to go over pt med list and send the medication to office that needed to go to [facility]. Pt is out and request for md to call him

## 2020-05-15 NOTE — Telephone Encounter (Signed)
Returned pt call. Pt states he only received 4 medications. Made pt aware that he has 8 medications. Pt states he will contact [facility] and I made pt aware that if they have any questions for them to give [place] a call

## 2020-05-29 ENCOUNTER — Inpatient Hospital Stay (HOSPITAL_COMMUNITY): Payer: Medicaid Other

## 2020-05-29 ENCOUNTER — Emergency Department (HOSPITAL_COMMUNITY): Payer: Medicaid Other

## 2020-05-29 ENCOUNTER — Ambulatory Visit: Payer: Self-pay | Admitting: Family

## 2020-05-29 ENCOUNTER — Inpatient Hospital Stay (HOSPITAL_COMMUNITY)
Admission: EM | Admit: 2020-05-29 | Discharge: 2020-06-08 | DRG: 917 | Disposition: E | Payer: Medicaid Other | Attending: Emergency Medicine | Admitting: Emergency Medicine

## 2020-05-29 ENCOUNTER — Other Ambulatory Visit: Payer: Self-pay

## 2020-05-29 ENCOUNTER — Encounter (HOSPITAL_COMMUNITY): Payer: Self-pay

## 2020-05-29 DIAGNOSIS — Q898 Other specified congenital malformations: Secondary | ICD-10-CM

## 2020-05-29 DIAGNOSIS — E872 Acidosis: Secondary | ICD-10-CM | POA: Diagnosis present

## 2020-05-29 DIAGNOSIS — J9602 Acute respiratory failure with hypercapnia: Secondary | ICD-10-CM | POA: Diagnosis present

## 2020-05-29 DIAGNOSIS — Z20822 Contact with and (suspected) exposure to covid-19: Secondary | ICD-10-CM | POA: Diagnosis present

## 2020-05-29 DIAGNOSIS — Z885 Allergy status to narcotic agent status: Secondary | ICD-10-CM

## 2020-05-29 DIAGNOSIS — I468 Cardiac arrest due to other underlying condition: Secondary | ICD-10-CM | POA: Diagnosis present

## 2020-05-29 DIAGNOSIS — Z91030 Bee allergy status: Secondary | ICD-10-CM

## 2020-05-29 DIAGNOSIS — E876 Hypokalemia: Secondary | ICD-10-CM | POA: Diagnosis not present

## 2020-05-29 DIAGNOSIS — E8729 Other acidosis: Secondary | ICD-10-CM | POA: Diagnosis present

## 2020-05-29 DIAGNOSIS — T50901A Poisoning by unspecified drugs, medicaments and biological substances, accidental (unintentional), initial encounter: Secondary | ICD-10-CM | POA: Diagnosis present

## 2020-05-29 DIAGNOSIS — N179 Acute kidney failure, unspecified: Secondary | ICD-10-CM | POA: Diagnosis present

## 2020-05-29 DIAGNOSIS — H5704 Mydriasis: Secondary | ICD-10-CM | POA: Diagnosis present

## 2020-05-29 DIAGNOSIS — G253 Myoclonus: Secondary | ICD-10-CM | POA: Diagnosis present

## 2020-05-29 DIAGNOSIS — Z66 Do not resuscitate: Secondary | ICD-10-CM | POA: Diagnosis not present

## 2020-05-29 DIAGNOSIS — Z515 Encounter for palliative care: Secondary | ICD-10-CM | POA: Diagnosis not present

## 2020-05-29 DIAGNOSIS — Z818 Family history of other mental and behavioral disorders: Secondary | ICD-10-CM

## 2020-05-29 DIAGNOSIS — K7031 Alcoholic cirrhosis of liver with ascites: Secondary | ICD-10-CM | POA: Diagnosis present

## 2020-05-29 DIAGNOSIS — J9690 Respiratory failure, unspecified, unspecified whether with hypoxia or hypercapnia: Secondary | ICD-10-CM

## 2020-05-29 DIAGNOSIS — F419 Anxiety disorder, unspecified: Secondary | ICD-10-CM | POA: Diagnosis present

## 2020-05-29 DIAGNOSIS — R570 Cardiogenic shock: Secondary | ICD-10-CM | POA: Diagnosis present

## 2020-05-29 DIAGNOSIS — B20 Human immunodeficiency virus [HIV] disease: Secondary | ICD-10-CM | POA: Diagnosis present

## 2020-05-29 DIAGNOSIS — R6521 Severe sepsis with septic shock: Secondary | ICD-10-CM | POA: Diagnosis present

## 2020-05-29 DIAGNOSIS — I8501 Esophageal varices with bleeding: Secondary | ICD-10-CM | POA: Diagnosis present

## 2020-05-29 DIAGNOSIS — Z8249 Family history of ischemic heart disease and other diseases of the circulatory system: Secondary | ICD-10-CM

## 2020-05-29 DIAGNOSIS — Z79899 Other long term (current) drug therapy: Secondary | ICD-10-CM

## 2020-05-29 DIAGNOSIS — F332 Major depressive disorder, recurrent severe without psychotic features: Secondary | ICD-10-CM | POA: Diagnosis present

## 2020-05-29 DIAGNOSIS — J690 Pneumonitis due to inhalation of food and vomit: Secondary | ICD-10-CM | POA: Diagnosis present

## 2020-05-29 DIAGNOSIS — F17210 Nicotine dependence, cigarettes, uncomplicated: Secondary | ICD-10-CM | POA: Diagnosis present

## 2020-05-29 DIAGNOSIS — E875 Hyperkalemia: Secondary | ICD-10-CM | POA: Diagnosis present

## 2020-05-29 DIAGNOSIS — A419 Sepsis, unspecified organism: Secondary | ICD-10-CM | POA: Diagnosis present

## 2020-05-29 DIAGNOSIS — K7040 Alcoholic hepatic failure without coma: Secondary | ICD-10-CM | POA: Diagnosis not present

## 2020-05-29 DIAGNOSIS — I469 Cardiac arrest, cause unspecified: Secondary | ICD-10-CM

## 2020-05-29 DIAGNOSIS — R404 Transient alteration of awareness: Secondary | ICD-10-CM | POA: Diagnosis present

## 2020-05-29 DIAGNOSIS — D6489 Other specified anemias: Secondary | ICD-10-CM | POA: Diagnosis present

## 2020-05-29 DIAGNOSIS — E739 Lactose intolerance, unspecified: Secondary | ICD-10-CM | POA: Diagnosis present

## 2020-05-29 DIAGNOSIS — Z9911 Dependence on respirator [ventilator] status: Secondary | ICD-10-CM | POA: Diagnosis not present

## 2020-05-29 DIAGNOSIS — F1910 Other psychoactive substance abuse, uncomplicated: Secondary | ICD-10-CM | POA: Diagnosis present

## 2020-05-29 DIAGNOSIS — Z01818 Encounter for other preprocedural examination: Secondary | ICD-10-CM

## 2020-05-29 DIAGNOSIS — R069 Unspecified abnormalities of breathing: Secondary | ICD-10-CM

## 2020-05-29 DIAGNOSIS — F1010 Alcohol abuse, uncomplicated: Secondary | ICD-10-CM | POA: Diagnosis present

## 2020-05-29 DIAGNOSIS — D62 Acute posthemorrhagic anemia: Secondary | ICD-10-CM | POA: Diagnosis present

## 2020-05-29 DIAGNOSIS — F1024 Alcohol dependence with alcohol-induced mood disorder: Secondary | ICD-10-CM | POA: Diagnosis present

## 2020-05-29 DIAGNOSIS — K219 Gastro-esophageal reflux disease without esophagitis: Secondary | ICD-10-CM | POA: Diagnosis present

## 2020-05-29 DIAGNOSIS — N170 Acute kidney failure with tubular necrosis: Secondary | ICD-10-CM | POA: Diagnosis present

## 2020-05-29 DIAGNOSIS — Z21 Asymptomatic human immunodeficiency virus [HIV] infection status: Secondary | ICD-10-CM | POA: Diagnosis present

## 2020-05-29 DIAGNOSIS — J9601 Acute respiratory failure with hypoxia: Secondary | ICD-10-CM | POA: Diagnosis present

## 2020-05-29 DIAGNOSIS — Z452 Encounter for adjustment and management of vascular access device: Secondary | ICD-10-CM

## 2020-05-29 DIAGNOSIS — G931 Anoxic brain damage, not elsewhere classified: Secondary | ICD-10-CM | POA: Diagnosis present

## 2020-05-29 DIAGNOSIS — R000 Tachycardia, unspecified: Secondary | ICD-10-CM | POA: Diagnosis present

## 2020-05-29 LAB — I-STAT ARTERIAL BLOOD GAS, ED
Acid-base deficit: 10 mmol/L — ABNORMAL HIGH (ref 0.0–2.0)
Bicarbonate: 18.7 mmol/L — ABNORMAL LOW (ref 20.0–28.0)
Calcium, Ion: 1.04 mmol/L — ABNORMAL LOW (ref 1.15–1.40)
HCT: 30 % — ABNORMAL LOW (ref 39.0–52.0)
Hemoglobin: 10.2 g/dL — ABNORMAL LOW (ref 13.0–17.0)
O2 Saturation: 100 %
Patient temperature: 98.2
Potassium: 6.7 mmol/L (ref 3.5–5.1)
Sodium: 139 mmol/L (ref 135–145)
TCO2: 20 mmol/L — ABNORMAL LOW (ref 22–32)
pCO2 arterial: 56.3 mmHg — ABNORMAL HIGH (ref 32.0–48.0)
pH, Arterial: 7.129 — CL (ref 7.350–7.450)
pO2, Arterial: 545 mmHg — ABNORMAL HIGH (ref 83.0–108.0)

## 2020-05-29 LAB — COMPREHENSIVE METABOLIC PANEL
ALT: 109 U/L — ABNORMAL HIGH (ref 0–44)
AST: 178 U/L — ABNORMAL HIGH (ref 15–41)
Albumin: 3.4 g/dL — ABNORMAL LOW (ref 3.5–5.0)
Alkaline Phosphatase: 144 U/L — ABNORMAL HIGH (ref 38–126)
Anion gap: 15 (ref 5–15)
BUN: 22 mg/dL — ABNORMAL HIGH (ref 6–20)
CO2: 18 mmol/L — ABNORMAL LOW (ref 22–32)
Calcium: 8.5 mg/dL — ABNORMAL LOW (ref 8.9–10.3)
Chloride: 107 mmol/L (ref 98–111)
Creatinine, Ser: 2.26 mg/dL — ABNORMAL HIGH (ref 0.61–1.24)
GFR, Estimated: 35 mL/min — ABNORMAL LOW (ref >60)
Glucose, Bld: 75 mg/dL (ref 70–99)
Potassium: 6.5 mmol/L (ref 3.5–5.1)
Sodium: 140 mmol/L (ref 135–145)
Total Bilirubin: 0.9 mg/dL (ref 0.3–1.2)
Total Protein: 7.3 g/dL (ref 6.5–8.1)

## 2020-05-29 LAB — CBC WITH DIFFERENTIAL/PLATELET
Abs Immature Granulocytes: 0.23 10*3/uL — ABNORMAL HIGH (ref 0.00–0.07)
Abs Immature Granulocytes: 0 10*3/uL (ref 0.00–0.07)
Basophils Absolute: 0.1 10*3/uL (ref 0.0–0.1)
Basophils Absolute: 0 10*3/uL (ref 0.0–0.1)
Basophils Relative: 1 %
Basophils Relative: 0 %
Eosinophils Absolute: 0.1 10*3/uL (ref 0.0–0.5)
Eosinophils Absolute: 0 10*3/uL (ref 0.0–0.5)
Eosinophils Relative: 1 %
Eosinophils Relative: 0 %
HCT: 29.3 % — ABNORMAL LOW (ref 39.0–52.0)
HCT: 38 % — ABNORMAL LOW (ref 39.0–52.0)
Hemoglobin: 8.1 g/dL — ABNORMAL LOW (ref 13.0–17.0)
Hemoglobin: 11.6 g/dL — ABNORMAL LOW (ref 13.0–17.0)
Immature Granulocytes: 1 %
Lymphocytes Relative: 13 %
Lymphocytes Relative: 4 %
Lymphs Abs: 2.3 10*3/uL (ref 0.7–4.0)
Lymphs Abs: 0.4 10*3/uL — ABNORMAL LOW (ref 0.7–4.0)
MCH: 24.4 pg — ABNORMAL LOW (ref 26.0–34.0)
MCH: 24.8 pg — ABNORMAL LOW (ref 26.0–34.0)
MCHC: 27.6 g/dL — ABNORMAL LOW (ref 30.0–36.0)
MCHC: 30.5 g/dL (ref 30.0–36.0)
MCV: 88.3 fL (ref 80.0–100.0)
MCV: 81.2 fL (ref 80.0–100.0)
Monocytes Absolute: 0.8 10*3/uL (ref 0.1–1.0)
Monocytes Absolute: 0.4 10*3/uL (ref 0.1–1.0)
Monocytes Relative: 5 %
Monocytes Relative: 4 %
Neutro Abs: 14.1 10*3/uL — ABNORMAL HIGH (ref 1.7–7.7)
Neutro Abs: 9.4 10*3/uL — ABNORMAL HIGH (ref 1.7–7.7)
Neutrophils Relative %: 79 %
Neutrophils Relative %: 92 %
Platelets: 241 10*3/uL (ref 150–400)
Platelets: 187 10*3/uL (ref 150–400)
RBC: 3.32 MIL/uL — ABNORMAL LOW (ref 4.22–5.81)
RBC: 4.68 MIL/uL (ref 4.22–5.81)
RDW: 22.5 % — ABNORMAL HIGH (ref 11.5–15.5)
RDW: 21.7 % — ABNORMAL HIGH (ref 11.5–15.5)
WBC: 17.7 10*3/uL — ABNORMAL HIGH (ref 4.0–10.5)
WBC: 10.2 10*3/uL (ref 4.0–10.5)
nRBC: 0 % (ref 0.0–0.2)
nRBC: 0 % (ref 0.0–0.2)
nRBC: 0 /100 WBC

## 2020-05-29 LAB — OCCULT BLOOD GASTRIC / DUODENUM (SPECIMEN CUP): Occult Blood, Gastric: POSITIVE — AB

## 2020-05-29 LAB — HEPATIC FUNCTION PANEL
ALT: 297 U/L — ABNORMAL HIGH (ref 0–44)
AST: 502 U/L — ABNORMAL HIGH (ref 15–41)
Albumin: 3.2 g/dL — ABNORMAL LOW (ref 3.5–5.0)
Alkaline Phosphatase: 150 U/L — ABNORMAL HIGH (ref 38–126)
Bilirubin, Direct: 1.1 mg/dL — ABNORMAL HIGH (ref 0.0–0.2)
Indirect Bilirubin: 0.9 mg/dL (ref 0.3–0.9)
Total Bilirubin: 2 mg/dL — ABNORMAL HIGH (ref 0.3–1.2)
Total Protein: 7.5 g/dL (ref 6.5–8.1)

## 2020-05-29 LAB — CK
Total CK: 52 U/L (ref 49–397)
Total CK: 236 U/L (ref 49–397)

## 2020-05-29 LAB — PREPARE RBC (CROSSMATCH)

## 2020-05-29 LAB — HEMOGLOBIN A1C
Hgb A1c MFr Bld: 5.8 % — ABNORMAL HIGH (ref 4.8–5.6)
Mean Plasma Glucose: 119.76 mg/dL

## 2020-05-29 LAB — BASIC METABOLIC PANEL
Anion gap: 13 (ref 5–15)
BUN: 29 mg/dL — ABNORMAL HIGH (ref 6–20)
CO2: 17 mmol/L — ABNORMAL LOW (ref 22–32)
Calcium: 7.4 mg/dL — ABNORMAL LOW (ref 8.9–10.3)
Chloride: 104 mmol/L (ref 98–111)
Creatinine, Ser: 1.81 mg/dL — ABNORMAL HIGH (ref 0.61–1.24)
GFR, Estimated: 46 mL/min — ABNORMAL LOW (ref >60)
Glucose, Bld: 140 mg/dL — ABNORMAL HIGH (ref 70–99)
Potassium: 4.8 mmol/L (ref 3.5–5.1)
Sodium: 134 mmol/L — ABNORMAL LOW (ref 135–145)

## 2020-05-29 LAB — PROTIME-INR
INR: 1.5 — ABNORMAL HIGH (ref 0.8–1.2)
INR: 1.5 — ABNORMAL HIGH (ref 0.8–1.2)
Prothrombin Time: 17.7 seconds — ABNORMAL HIGH (ref 11.4–15.2)
Prothrombin Time: 17.6 seconds — ABNORMAL HIGH (ref 11.4–15.2)

## 2020-05-29 LAB — RAPID URINE DRUG SCREEN, HOSP PERFORMED
Amphetamines: NOT DETECTED
Barbiturates: NOT DETECTED
Benzodiazepines: NOT DETECTED
Cocaine: POSITIVE — AB
Opiates: POSITIVE — AB
Tetrahydrocannabinol: NOT DETECTED

## 2020-05-29 LAB — POC OCCULT BLOOD, ED: Fecal Occult Bld: POSITIVE — AB

## 2020-05-29 LAB — I-STAT CHEM 8, ED
BUN: 27 mg/dL — ABNORMAL HIGH (ref 6–20)
Calcium, Ion: 1.08 mmol/L — ABNORMAL LOW (ref 1.15–1.40)
Chloride: 111 mmol/L (ref 98–111)
Creatinine, Ser: 2.1 mg/dL — ABNORMAL HIGH (ref 0.61–1.24)
Glucose, Bld: 74 mg/dL (ref 70–99)
HCT: 28 % — ABNORMAL LOW (ref 39.0–52.0)
Hemoglobin: 9.5 g/dL — ABNORMAL LOW (ref 13.0–17.0)
Potassium: 6.4 mmol/L (ref 3.5–5.1)
Sodium: 141 mmol/L (ref 135–145)
TCO2: 22 mmol/L (ref 22–32)

## 2020-05-29 LAB — POCT I-STAT 7, (LYTES, BLD GAS, ICA,H+H)
Acid-base deficit: 9 mmol/L — ABNORMAL HIGH (ref 0.0–2.0)
Bicarbonate: 19.3 mmol/L — ABNORMAL LOW (ref 20.0–28.0)
Calcium, Ion: 1 mmol/L — ABNORMAL LOW (ref 1.15–1.40)
HCT: 38 % — ABNORMAL LOW (ref 39.0–52.0)
Hemoglobin: 12.9 g/dL — ABNORMAL LOW (ref 13.0–17.0)
O2 Saturation: 95 %
Patient temperature: 34.5
Potassium: 5.8 mmol/L — ABNORMAL HIGH (ref 3.5–5.1)
Sodium: 141 mmol/L (ref 135–145)
TCO2: 21 mmol/L — ABNORMAL LOW (ref 22–32)
pCO2 arterial: 44.1 mmHg (ref 32.0–48.0)
pH, Arterial: 7.234 — ABNORMAL LOW (ref 7.350–7.450)
pO2, Arterial: 80 mmHg — ABNORMAL LOW (ref 83.0–108.0)

## 2020-05-29 LAB — MRSA PCR SCREENING: MRSA by PCR: NEGATIVE

## 2020-05-29 LAB — GLUCOSE, CAPILLARY
Glucose-Capillary: 127 mg/dL — ABNORMAL HIGH (ref 70–99)
Glucose-Capillary: 140 mg/dL — ABNORMAL HIGH (ref 70–99)

## 2020-05-29 LAB — RESP PANEL BY RT-PCR (FLU A&B, COVID) ARPGX2
Influenza A by PCR: NEGATIVE
Influenza B by PCR: NEGATIVE
SARS Coronavirus 2 by RT PCR: NEGATIVE

## 2020-05-29 LAB — TROPONIN I (HIGH SENSITIVITY)
Troponin I (High Sensitivity): 56 ng/L — ABNORMAL HIGH (ref <18)
Troponin I (High Sensitivity): 87 ng/L — ABNORMAL HIGH (ref <18)

## 2020-05-29 LAB — APTT: aPTT: 40 seconds — ABNORMAL HIGH (ref 24–36)

## 2020-05-29 LAB — LACTIC ACID, PLASMA
Lactic Acid, Venous: 7.5 mmol/L (ref 0.5–1.9)
Lactic Acid, Venous: 3.3 mmol/L (ref 0.5–1.9)

## 2020-05-29 LAB — GLUCOSE, RANDOM: Glucose, Bld: 145 mg/dL — ABNORMAL HIGH (ref 70–99)

## 2020-05-29 MED ORDER — POLYETHYLENE GLYCOL 3350 17 G PO PACK
17.0000 g | PACK | Freq: Every day | ORAL | Status: DC
  Administered 2020-05-30 – 2020-06-01 (×3): 17 g
  Filled 2020-05-29 (×4): qty 1

## 2020-05-29 MED ORDER — SODIUM CHLORIDE 0.9% FLUSH
10.0000 mL | Freq: Two times a day (BID) | INTRAVENOUS | Status: DC
  Administered 2020-05-29 – 2020-06-01 (×6): 10 mL

## 2020-05-29 MED ORDER — DOCUSATE SODIUM 50 MG/5ML PO LIQD
100.0000 mg | Freq: Two times a day (BID) | ORAL | Status: DC
  Filled 2020-05-29 (×2): qty 10

## 2020-05-29 MED ORDER — FENTANYL CITRATE (PF) 100 MCG/2ML IJ SOLN: 50.0000 ug | INTRAMUSCULAR | Status: DC | PRN

## 2020-05-29 MED ORDER — PANTOPRAZOLE SODIUM 40 MG IV SOLR
40.0000 mg | Freq: Two times a day (BID) | INTRAVENOUS | Status: DC
  Administered 2020-05-29 – 2020-06-01 (×7): 40 mg via INTRAVENOUS
  Filled 2020-05-29 (×7): qty 40

## 2020-05-29 MED ORDER — INSULIN ASPART 100 UNIT/ML ~~LOC~~ SOLN
0.0000 [IU] | SUBCUTANEOUS | Status: DC
  Administered 2020-05-29 – 2020-05-30 (×3): 1 [IU] via SUBCUTANEOUS
  Administered 2020-05-30 (×3): 2 [IU] via SUBCUTANEOUS
  Administered 2020-05-30: 3 [IU] via SUBCUTANEOUS
  Administered 2020-05-31 (×2): 2 [IU] via SUBCUTANEOUS

## 2020-05-29 MED ORDER — SODIUM BICARBONATE 8.4 % IV SOLN
50.0000 meq | Freq: Once | INTRAVENOUS | Status: AC
  Administered 2020-05-29: 50 meq via INTRAVENOUS
  Filled 2020-05-29: qty 50

## 2020-05-29 MED ORDER — FOLIC ACID 5 MG/ML IJ SOLN
1.0000 mg | Freq: Every day | INTRAMUSCULAR | Status: DC
  Administered 2020-05-29 – 2020-06-01 (×4): 1 mg via INTRAVENOUS
  Filled 2020-05-29 (×4): qty 0.2

## 2020-05-29 MED ORDER — POLYETHYLENE GLYCOL 3350 17 G PO PACK: 17.0000 g | PACK | Freq: Every day | ORAL | Status: DC

## 2020-05-29 MED ORDER — SODIUM CHLORIDE 0.9 % IV SOLN
1.5000 g | Freq: Four times a day (QID) | INTRAVENOUS | Status: DC
  Administered 2020-05-29 – 2020-06-01 (×13): 1.5 g via INTRAVENOUS
  Filled 2020-05-29 (×4): qty 4
  Filled 2020-05-29: qty 1.5
  Filled 2020-05-29 (×14): qty 4

## 2020-05-29 MED ORDER — DOCUSATE SODIUM 50 MG/5ML PO LIQD
100.0000 mg | Freq: Two times a day (BID) | ORAL | Status: DC
  Administered 2020-05-29 – 2020-06-01 (×6): 100 mg
  Filled 2020-05-29 (×8): qty 10

## 2020-05-29 MED ORDER — DEXTROSE 50 % IV SOLN
1.0000 | Freq: Once | INTRAVENOUS | Status: AC
  Administered 2020-05-29: 50 mL via INTRAVENOUS
  Filled 2020-05-29: qty 50

## 2020-05-29 MED ORDER — DOCUSATE SODIUM 50 MG/5ML PO LIQD: 100.0000 mg | Freq: Two times a day (BID) | ORAL | Status: DC

## 2020-05-29 MED ORDER — SODIUM CHLORIDE 0.9 % IV SOLN: INTRAVENOUS | Status: DC

## 2020-05-29 MED ORDER — CHLORHEXIDINE GLUCONATE CLOTH 2 % EX PADS
6.0000 | MEDICATED_PAD | Freq: Every day | CUTANEOUS | Status: DC
  Administered 2020-05-29 – 2020-06-01 (×3): 6 via TOPICAL

## 2020-05-29 MED ORDER — NOREPINEPHRINE 4 MG/250ML-% IV SOLN
0.0000 ug/min | INTRAVENOUS | Status: DC
  Administered 2020-05-29: 15 ug/min via INTRAVENOUS

## 2020-05-29 MED ORDER — MIDAZOLAM HCL 2 MG/2ML IJ SOLN: 2.0000 mg | INTRAMUSCULAR | Status: DC | PRN

## 2020-05-29 MED ORDER — INSULIN ASPART 100 UNIT/ML IV SOLN
5.0000 [IU] | Freq: Once | INTRAVENOUS | Status: AC
  Administered 2020-05-29: 5 [IU] via INTRAVENOUS

## 2020-05-29 MED ORDER — ORAL CARE MOUTH RINSE
15.0000 mL | OROMUCOSAL | Status: DC
  Administered 2020-05-29 – 2020-06-01 (×30): 15 mL via OROMUCOSAL

## 2020-05-29 MED ORDER — SODIUM CHLORIDE 0.9 % IV SOLN: 10.0000 mL/h | Freq: Once | INTRAVENOUS | Status: DC

## 2020-05-29 MED ORDER — FAMOTIDINE IN NACL 20-0.9 MG/50ML-% IV SOLN: 20.0000 mg | Freq: Two times a day (BID) | INTRAVENOUS | Status: DC

## 2020-05-29 MED ORDER — SODIUM CHLORIDE 0.9 % IV SOLN
50.0000 ug/h | INTRAVENOUS | Status: DC
  Administered 2020-05-29 – 2020-05-30 (×3): 50 ug/h via INTRAVENOUS
  Filled 2020-05-29 (×3): qty 1

## 2020-05-29 MED ORDER — OCTREOTIDE LOAD VIA INFUSION
50.0000 ug | Freq: Once | INTRAVENOUS | Status: AC
  Administered 2020-05-29: 50 ug via INTRAVENOUS
  Filled 2020-05-29: qty 25

## 2020-05-29 MED ORDER — THIAMINE HCL 100 MG/ML IJ SOLN
100.0000 mg | Freq: Every day | INTRAMUSCULAR | Status: DC
  Administered 2020-05-29 – 2020-06-01 (×4): 100 mg via INTRAVENOUS
  Filled 2020-05-29 (×4): qty 2

## 2020-05-29 MED ORDER — SODIUM CHLORIDE 0.9% FLUSH
10.0000 mL | INTRAVENOUS | Status: DC | PRN
Start: 2020-05-29 — End: 2020-06-02

## 2020-05-29 MED ORDER — MIDAZOLAM HCL 2 MG/2ML IJ SOLN
2.0000 mg | INTRAMUSCULAR | Status: DC | PRN
  Administered 2020-05-29 – 2020-06-01 (×2): 2 mg via INTRAVENOUS
  Filled 2020-05-29 (×2): qty 2

## 2020-05-29 MED ORDER — NOREPINEPHRINE 4 MG/250ML-% IV SOLN
0.0000 ug/min | INTRAVENOUS | Status: DC
  Administered 2020-05-29: 20 ug/min via INTRAVENOUS

## 2020-05-29 MED ORDER — SODIUM BICARBONATE 8.4 % IV SOLN
INTRAVENOUS | Status: DC
  Filled 2020-05-29 (×5): qty 850

## 2020-05-29 MED ORDER — FENTANYL CITRATE (PF) 100 MCG/2ML IJ SOLN
50.0000 ug | Freq: Once | INTRAMUSCULAR | Status: AC
  Administered 2020-05-29: 50 ug via INTRAVENOUS
  Filled 2020-05-29: qty 2

## 2020-05-29 MED ORDER — PROPOFOL 1000 MG/100ML IV EMUL
0.0000 ug/kg/min | INTRAVENOUS | Status: DC
  Administered 2020-05-29: 10 ug/kg/min via INTRAVENOUS
  Administered 2020-05-29: 30 ug/kg/min via INTRAVENOUS
  Administered 2020-05-30: 25 ug/kg/min via INTRAVENOUS
  Administered 2020-05-30: 30 ug/kg/min via INTRAVENOUS
  Administered 2020-05-30: 25 ug/kg/min via INTRAVENOUS
  Administered 2020-05-31: 50 ug/kg/min via INTRAVENOUS
  Administered 2020-05-31 (×2): 35 ug/kg/min via INTRAVENOUS
  Administered 2020-05-31: 50 ug/kg/min via INTRAVENOUS
  Administered 2020-05-31: 35 ug/kg/min via INTRAVENOUS
  Administered 2020-05-31 – 2020-06-01 (×5): 50 ug/kg/min via INTRAVENOUS
  Administered 2020-06-01: 30 ug/kg/min via INTRAVENOUS
  Administered 2020-06-01: 50 ug/kg/min via INTRAVENOUS
  Filled 2020-05-29 (×16): qty 100

## 2020-05-29 MED ORDER — CHLORHEXIDINE GLUCONATE 0.12% ORAL RINSE (MEDLINE KIT)
15.0000 mL | Freq: Two times a day (BID) | OROMUCOSAL | Status: DC
  Administered 2020-05-29 – 2020-06-01 (×6): 15 mL via OROMUCOSAL

## 2020-05-29 MED ORDER — SODIUM CHLORIDE 0.9 % IV SOLN: 50.0000 ug/h | INTRAVENOUS | Status: DC

## 2020-05-29 NOTE — Progress Notes (Signed)
LTM EEG hooked up and running - no initial skin breakdown - push button tested - neuro notified.  

## 2020-05-29 NOTE — ED Notes (Signed)
At this time ICU provider is at the bedside starting central line

## 2020-05-29 NOTE — ED Notes (Addendum)
Report given to CN on 82M

## 2020-05-29 NOTE — Progress Notes (Signed)
EEG complete - results pending 

## 2020-05-29 NOTE — Progress Notes (Signed)
Patient transported to CT & back to trauma room B on the ventilator with no problems.

## 2020-05-29 NOTE — ED Notes (Signed)
ICU at bedside

## 2020-05-29 NOTE — ED Notes (Addendum)
Per 83M CN  The pt is unable to go upstairs at this time.

## 2020-05-29 NOTE — Procedures (Signed)
Central Venous Catheter Insertion Procedure Note  Thomas Atkins  [id number]  12/20/1975  Date:05/08/2020  Time:3:04 PM   Provider Performing:Pete Bea Laura Tanja Port   Procedure: Insertion of Non-tunneled Central Venous [phone]) with US guidance ([id number])   Indication(s) Medication administration  Consent Risks of the procedure as well as the alternatives and risks of each were explained to the patient and/or caregiver.  Consent for the procedure was obtained and is signed in the bedside chart  Anesthesia Topical only with 1% lidocaine   Timeout Verified patient identification, verified procedure, site/side was marked, verified correct patient position, special equipment/implants available, medications/allergies/relevant history reviewed, required imaging and test results available.  Sterile Technique Maximal sterile technique including full sterile barrier drape, hand hygiene, sterile gown, sterile gloves, mask, hair covering, sterile ultrasound probe cover (if used).  Procedure Description Area of catheter insertion was cleaned with chlorhexidine and draped in sterile fashion.  With real-time ultrasound guidance a central venous catheter was placed into the left internal jugular vein. Nonpulsatile blood flow and easy flushing noted in all ports.  The catheter was sutured in place and sterile dressing applied.  Complications/Tolerance None; patient tolerated the procedure well. Chest X-ray is ordered to verify placement for internal jugular or subclavian cannulation.   Chest x-ray is not ordered for femoral cannulation.  EBL Minimal  Specimen(s) None  Simonne Martinet ACNP-BC [facility] Pulmonary/Critical Care Pager # [phone] OR # [phone] if no answer

## 2020-05-29 NOTE — ED Notes (Signed)
1 amp of sodium bicarb

## 2020-05-29 NOTE — ED Triage Notes (Addendum)
PT BIB EMS from home. Pt LKN was 3am. Pt roommate found him in the bed unresponsive. Fire arrived and started [employer]. Pt was warm on arrival. Pt was asystole on monitor and 2 rounds of epi given and pulses regain. Drugs was found at scene. 20 mins for cpr done by fire and EMS before pulse regain. Unknown downtime.

## 2020-05-29 NOTE — Progress Notes (Signed)
Patient transported to 3M05 from the ED with no apparent complications.

## 2020-05-29 NOTE — ED Provider Notes (Signed)
[facility] EMERGENCY DEPARTMENT Provider Note   CSN: [id number] Arrival date & time: 05/14/2020  1209     History Chief Complaint  Patient presents with  . Cardiac Arrest    Thomas Atkins is a 45 y.o. male.  45 year old male with prior medical history as detailed below presents post cardiac arrest.  Patient was found by his roommate in his bed.  Patient with unknown last well time.  Patient was without evidence of trauma.  EMS reports that initial providers on scene found him to be in asystole with no pulse.  CPR performed for approximately 20 to 25 minutes.  Patient was given 2 doses of epinephrine.  King airway was placed.  Patient regained pulses after approximately 25 minutes.  Patient is now post arrest.  Ventilations in progress through [place] tube.  Patient without spontaneous respiration attempts.  Patient noted to be both tachycardic and hypotensive.  EMS reports that there was a baggy full of "white powder" on scene.  The history is provided by medical records and the EMS personnel. The history is limited by the condition of the patient.  Cardiac Arrest Witnessed by:  Not witnessed Incident location:  Home Condition upon EMS arrival:  Unresponsive Pulse:  Present Initial cardiac rhythm per EMS:  Asystole Airway:  Combitube Rhythm on admission to ED:  Sinus tachycardia      Past Medical History:  Diagnosis Date  . Alcoholism (HCC)   . Anxiety   . Depression   . HIV infection (HCC)   . Seizure disorder (HCC)   . Syncope     Patient Active Problem List   Diagnosis Date Noted  . Esophageal varices (HCC) 04/03/2020  . Acute blood loss anemia 03/31/2020  . GERD without esophagitis 10/11/2019  . BPH without obstruction/lower urinary tract symptoms 10/11/2019  . Abdominal pain 08/27/2019  . Alcoholic cirrhosis of liver with ascites (HCC) 08/27/2019  . Healthcare maintenance 08/03/2019  . Rash and nonspecific skin eruption 07/22/2019  .  Constipation 06/16/2019  . HIV (human immunodeficiency virus infection) (HCC) 06/16/2019  . GIB (gastrointestinal bleeding) 05/26/2019  . History of hypertension 07/31/2017  . Alcohol use disorder, moderate, dependence (HCC) 07/31/2017  . Nicotine dependence, cigarettes, uncomplicated 07/31/2017  . Alcohol-induced insomnia (HCC) 07/31/2017  . Major depressive disorder, recurrent severe without psychotic features (HCC) 10/10/2015  . Alcohol abuse with alcohol-induced mood disorder (HCC) 05/31/2015  . Alcohol abuse 02/16/2013    Past Surgical History:  Procedure Laterality Date  . ESOPHAGEAL BANDING  04/02/2020   Procedure: ESOPHAGEAL BANDING;  Surgeon: Kerin Salen, MD;  Location: [facility];  Service: Gastroenterology;;  placed 5 bands  . ESOPHAGOGASTRODUODENOSCOPY (EGD) WITH PROPOFOL Left 04/02/2020   Procedure: ESOPHAGOGASTRODUODENOSCOPY (EGD) WITH PROPOFOL;  Surgeon: Kerin Salen, MD;  Location: [facility];  Service: Gastroenterology;  Laterality: Left;  . IR PARACENTESIS  07/01/2019  . IR PARACENTESIS  07/15/2019  . IR PARACENTESIS  07/27/2019  . IR PARACENTESIS  08/03/2019  . IR PARACENTESIS  08/10/2019  . IR PARACENTESIS  08/24/2019  . SURGERY SCROTAL / TESTICULAR         Family History  Problem Relation Age of Onset  . Heart disease Paternal Grandmother   . Bipolar disorder Father     Social History   Tobacco Use  . Smoking status: Current Every Day Smoker    Packs/day: 1.00    Years: 30.00    Pack years: 30.00    Types: Cigarettes  . Smokeless tobacco: Never Used  .  Tobacco comment: Patient refused cessation materials  Vaping Use  . Vaping Use: Never used  Substance Use Topics  . Alcohol use: Yes    Alcohol/week: 24.0 standard drinks    Types: 24 Cans of beer per week    Comment: 6 pack per day  . Drug use: Yes    Types: Marijuana, Cocaine    Comment: on occasion    Home Medications Prior to Admission medications   Medication Sig Start Date End Date Taking?  Authorizing Provider  Aspirin-Salicylamide-Caffeine (BC HEADACHE POWDER PO) Take 1 packet by mouth 2 (two) times daily as needed (pain/headache).    [provider]  bictegravir-emtricitabine-tenofovir AF (BIKTARVY) 50-200-25 MG TABS tablet Take 1 tablet by mouth daily. 02/14/20   Veryl Speak, FNP  Bismuth Subsalicylate (PEPTO-BISMOL PO) Take 1 tablet by mouth daily as needed (heartburn/indigestion).    [provider]  folic acid (FOLVITE) 1 MG tablet Take 1 tablet (1 mg total) by mouth daily. 05/09/20   [name], MD  furosemide (LASIX) 20 MG tablet Take 1 tablet (20 mg total) by mouth daily. 05/09/20   [name], MD  hydrOXYzine (ATARAX/VISTARIL) 25 MG tablet Take 1 tablet (25 mg total) by mouth 3 (three) times daily as needed for anxiety. 05/09/20   [name], MD  Multiple Vitamin (MULTIVITAMIN WITH MINERALS) TABS tablet Take 1 tablet by mouth daily. 08/29/19   Azucena Fallen, MD  pantoprazole (PROTONIX) 40 MG tablet Take 1 tablet (40 mg total) by mouth daily. 05/09/20   [name], MD  propranolol (INDERAL) 10 MG tablet Take 1 tablet (10 mg total) by mouth 2 (two) times daily. 05/09/20   [name], MD  Sennosides (EX-LAX PO) Take 1 tablet by mouth daily as needed (constipation).    [provider]  spironolactone (ALDACTONE) 50 MG tablet Take 1 tablet (50 mg total) by mouth daily. 05/09/20   [name], MD  tamsulosin (FLOMAX) 0.4 MG CAPS capsule Take 1 capsule (0.4 mg total) by mouth daily. 05/09/20   [name], MD  thiamine 100 MG tablet Take 1 tablet (100 mg total) by mouth daily. Patient not taking: No sig reported 08/29/19   Azucena Fallen, MD  traZODone (DESYREL) 100 MG tablet Take 1.5 tablets (150 mg total) by mouth at bedtime as needed for sleep. 05/09/20   [name], MD    Allergies    Bee venom, Lactose intolerance (gi), and Ultram [tramadol hcl]  Review of Systems   Review of Systems   Unable to perform ROS: Acuity of condition    Physical Exam Updated Vital Signs BP 90/60   Pulse (!) 53   Resp (!) 7   Ht 6\' 1"  (1.854 m)   SpO2 100%   BMI 25.57 kg/m   Physical Exam Vitals and nursing note reviewed.  Constitutional:      General: He is not in acute distress.    Appearance: He is well-developed.     Comments: Obtunded, postarrest.  Ventilations through .  Patient is noted to be tachycardic and hypotensive.  No clear evidence of trauma.    HENT:     Head: Normocephalic and atraumatic.  Eyes:     Conjunctiva/sclera: Conjunctivae normal.     Comments: Pupils fixed, dilated --post epi,  Cardiovascular:     Rate and Rhythm: Regular rhythm. Tachycardia present.     Heart sounds: Normal heart sounds.  Pulmonary:     Effort: No  respiratory distress.     Comments: Ventilations through [employer] Combitube  Equal breath sounds bilaterally Abdominal:     General: There is no distension.     Palpations: Abdomen is soft.     Tenderness: There is no abdominal tenderness.  Musculoskeletal:        General: No deformity.     Cervical back: Normal range of motion and neck supple.     Comments: Flaccid throughout all 4 extremities  Skin:    General: Skin is warm and dry.  Neurological:     Comments: Obtunded, no spontaneous respiration attempts     ED Results / Procedures / Treatments   Labs (all labs ordered are listed, but only abnormal results are displayed) Labs Reviewed  I-STAT CHEM 8, ED - Abnormal; Notable for the following components:      Result Value   Potassium 6.4 (*)    BUN 27 (*)    Creatinine, Ser 2.10 (*)    Calcium, Ion 1.08 (*)    Hemoglobin 9.5 (*)    HCT 28.0 (*)    All other components within normal limits  POC OCCULT BLOOD, ED - Abnormal; Notable for the following components:   Fecal Occult Bld POSITIVE (*)    All other components within normal limits  RESP PANEL BY RT-PCR (FLU A&B, COVID) ARPGX2  SARS CORONAVIRUS 2  (TAT 6-24 HRS)  COMPREHENSIVE METABOLIC PANEL  CBC WITH DIFFERENTIAL/PLATELET  LACTIC ACID, PLASMA  LACTIC ACID, PLASMA  CK  PROTIME-INR  RAPID URINE DRUG SCREEN, HOSP PERFORMED  GLUCOSE, RANDOM  GASTRIC OCCULT BLOOD (1-CARD TO LAB)  TYPE AND SCREEN  PREPARE RBC (CROSSMATCH)  TROPONIN I (HIGH SENSITIVITY)    EKG None  Radiology DG Chest Port 1 View  Result Date: 05/24/2020 CLINICAL DATA:  Recent cardiac arrest, status post intubation EXAM: PORTABLE CHEST 1 VIEW COMPARISON:  03/30/2020 FINDINGS: Cardiac shadow is stable. Endotracheal tube and gastric catheter are noted in satisfactory position. Right basilar consolidation is noted medially with small right pleural effusion. No definitive rib abnormality is noted. Mild vascular congestion is seen as well. IMPRESSION: Mild vascular congestion. Right lower lobe consolidation with associated effusion. Tubes and lines as described in satisfactory position. Electronically Signed   By: Alcide Clever M.D.   On: 05/09/2020 13:02    Procedures Procedure Name: Intubation Date/Time: 05/16/2020 1:19 PM Performed by: Wynetta Fines, MD Pre-anesthesia Checklist: Patient identified, Emergency Drugs available, Suction available, Patient being monitored and Timeout performed Oxygen Delivery Method: Ambu bag Preoxygenation: Pre-oxygenation with 100% oxygen Induction Type: Cricoid Pressure applied Ventilation: Oral airway inserted - appropriate to patient size Laryngoscope Size: Glidescope and 3 Grade View: Grade I Tube size: 8.0 mm Number of attempts: 1 Intubation method: Glidescope. Placement Confirmation: ETT inserted through vocal cords under direct vision,  Positive ETCO2,  CO2 detector and Breath sounds checked- equal and bilateral Tube secured with: ETT holder Dental Injury: Teeth and Oropharynx as per pre-operative assessment         CRITICAL CARE Performed by: Wynetta Fines   Total critical care time: 55 minutes  Critical  care time was exclusive of separately billable procedures and treating other patients.  Critical care was necessary to treat or prevent imminent or life-threatening deterioration.  Critical care was time spent personally by me on the following activities: development of treatment plan with patient and/or surrogate as well as nursing, discussions with consultants, evaluation of patient's response to treatment, examination of patient, obtaining history from patient or surrogate,  ordering and performing treatments and interventions, ordering and review of laboratory studies, ordering and review of radiographic studies, pulse oximetry and re-evaluation of patient's condition.   Medications Ordered in ED Medications  norepinephrine (LEVOPHED) 4mg  in 250mL premix infusion (20 mcg/min Intravenous Rate/Dose Change 11/27/20 1245)  0.9 %  sodium chloride infusion (0 mL/hr Intravenous Hold 11/27/20 1300)  sodium bicarbonate injection 50 mEq (50 mEq Intravenous Given 11/27/20 1304)  insulin aspart (novoLOG) injection 5 Units (5 Units Intravenous Given 11/27/20 1304)    And  dextrose 50 % solution 50 mL (50 mLs Intravenous Given 11/27/20 1304)    ED Course  I have reviewed the triage vital signs and the nursing notes.  Pertinent labs & imaging results that were available during my care of the patient were reviewed by me and considered in my medical decision making (see chart for details).    MDM Rules/Calculators/A&P                          MDM  Screen complete  Thomas Atkins was evaluated in Emergency Department on 12-05-20 for the symptoms described in the history of present illness. He was evaluated in the context of the global COVID-19 pandemic, which necessitated consideration that the patient might be at risk for infection with the SARS-CoV-2 virus that causes COVID-19. Institutional protocols and algorithms that pertain to the evaluation of patients at risk for COVID-19 are in a state of rapid  change based on information released by regulatory bodies including the CDC and federal and state organizations. These policies and algorithms were followed during the patient's care in the ED.   Patient is presenting post arrest.  Initial presenting rhythm with EMS was asystole.  Patient with regained a pulse at approximately 25 minutes of CPR.  Patient was noted to be tachycardic and hypotensive on arrival to the ED.  Additional IV fluids, Levophed drip through peripheral IV, and 1 amp of bicarb administered prior to changing King tube to ET tube.  Patient's BP did improve prior to tube change.  Patient's mother is aware of case progression.  She agrees with plan for aggressive work-up.  Patient is full code at this time.  Critical care aware of case.  Care transferred to Dr. Craige CottaSood (CC) in the patient's room at bedside.    Final Clinical Impression(s) / ED Diagnoses Final diagnoses:  Cardiac arrest [facility](HCC)    Rx / DC Orders ED Discharge Orders    None       Wynetta FinesMessick, Sevyn Paredez C, MD 009/20/22 1321

## 2020-05-29 NOTE — Progress Notes (Signed)
Patient presented from ED with blood infusion hanging. Blood completed at 1620 vitals signs documented and no reaction noted. Unable to complete blood product due to blood not signed off in computer. I called the ED spoke with Nurse and she states that she signed off blood in computer with a second nurse. Paper work for chart completed. Blood completed at 1620 vitals documented.

## 2020-05-29 NOTE — ED Notes (Signed)
Tried calling to give report again but at this time [facility] states that still don't have the staff to take the patient.   Made CN Jess aware and at this time she called [facility] and they stated they are unable to take pt until after 4pm.  Will make never RN aware

## 2020-05-29 NOTE — H&P (Signed)
NAME:  Thomas Atkins, MRN:  [id number], DOB:  1975-12-03, LOS: 0 ADMISSION DATE:  05/31/2020, CONSULTATION DATE:  05/12/2020 REFERRING MD:  Dr. Rodena Medin, ER, CHIEF COMPLAINT:  Cardiac arrest   History of Present Illness:  45 yo male smoker found by roommate unresponsive in his bedroom  Last seen normal at 3 am on 05/12/2020.  EMS noted pt had white bag of powder next to him in bed.  He was found to be in asystole.  He had CPR for approximately 25 minutes before ROSC.  He has hx of ETOH with cirrhosis and varices, and HIV.  Hx from family, medical team and chart review  Pertinent  Medical History  ETOH with cirrhosis and varices, HIV, Depression, Anxiety, Substance abuse, Pectus deformity  Significant Hospital Events: Including procedures, antibiotic start and stop dates in addition to other pertinent events   . Admit, TTM  Interim History / Subjective:    Objective   Blood pressure 105/60, pulse (!) 47, temperature (!) 92.8 F (33.8 C), resp. rate (!) 29, height 6\' 1"  (1.854 m), SpO2 100 %.    Vent Mode: PRVC FiO2 (%):  [100 %] 100 % Set Rate:  [16 bmp] 16 bmp Vt Set:  [630 mL] 630 mL PEEP:  [5 cmH20] 5 cmH20 Plateau Pressure:  [32 cmH20] 32 cmH20  No intake or output data in the 24 hours ending 05/12/2020 1330 There were no vitals filed for this visit.  Examination:  General - seen in ER, unresponsive Eyes - pupils dilated and non reactive ENT - ETT in place Cardiac - regular, bradycardic, pectus deformity Chest - b/l crackles Abdomen - mild distention, decreased bowel sounds, no tenderness Extremities - no cyanosis, clubbing, or edema Skin - no rashes Neuro - has gag reflex, breaths over set rate on vent, no response to other stimuli GU - no lesions noted, foley in place     Labs/imaging that I havepersonally reviewed    CMP Latest Ref Rng & Units 05/09/2020 04/02/2020 03/31/2020  Glucose 70 - 99 mg/dL 74 04/02/2020) 585(I)  BUN 6 - 20 mg/dL 778(E) 13 8  Creatinine 0.61 - 1.24  mg/dL 42(P) 5.36(R 4.43  Sodium 135 - 145 mmol/L 141 138 136  Potassium 3.5 - 5.1 mmol/L 6.4(HH) 4.0 4.4  Chloride 98 - 111 mmol/L 111 106 103  CO2 22 - 32 mmol/L - 24 20(L)  Calcium 8.9 - 10.3 mg/dL - 9.1 9.0  Total Protein 6.5 - 8.1 g/dL - 8.1 1.54)  Total Bilirubin 0.3 - 1.2 mg/dL - 1.7(H) 1.6(H)  Alkaline Phos 38 - 126 U/L - 152(H) 147(H)  AST 15 - 41 U/L - 30 33  ALT 0 - 44 U/L - 17 20    CBC Latest Ref Rng & Units 06/05/2020 04/12/2020 04/03/2020  WBC 3.4 - 10.8 x10E3/uL - 10.8 5.7  Hemoglobin 13.0 - 17.0 g/dL 04/05/2020) 6.7(Y) 7.9(L)  Hematocrit 39.0 - 52.0 % 28.0(L) 32.3(L) 26.1(L)  Platelets 150 - 450 x10E3/uL - 273 179    ABG    Component Value Date/Time   PHART 7.384 10/11/2019 0450   PCO2ART 35.5 10/11/2019 0450   PO2ART 163 (H) 10/11/2019 0450   HCO3 21.1 10/11/2019 0450   TCO2 22 06/07/2020 1240   ACIDBASEDEF 3.0 (H) 10/11/2019 0450   O2SAT 99.0 10/11/2019 0450    Resolved Hospital Problem list     Assessment & Plan:   Asystolic cardiac arrest likely from accidental drug overdose. 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 Hx of anxiety, depression, ETOH, polysubstance abuse. - f/u CT head - thiamine, folic acid - hold outpt trazodone - f/u UDS  Aspiration pneumonitis. - add ABx, check cultures  Alcoholic cirrhosis with varices and ascites. - has coffee ground material in NG tube aspirate in ER - add protonix, octreotide - hold outpt lasix, inderal, aldactone  AKI from ATN 2nd to cardiac arrest. Metabolic acidosis with lactic acidosis. Hyperkalemia. - add HCO3 in IV fluid - f/u BMET, ABG, CPK - monitor urine outpt  Acute hypoxic respiratory failure in setting of cardiac arrest. - full vent support - f/u CXR, ABG - goal SpO2 > 92%  Shock. - septic, cardiogenic - pressors to keep MAP > 65 - continue IV fluids  Anemia of critical illness, chronic disease, and possible recurrent  upper GI bleeding. - f/u CBC - transfuse for Hb < 7 or significant bleeding  Hx of HIV. - hold outpt biktarvy  Best practice (evaluated daily)  Diet:  NPO Pain/Anxiety/Delirium protocol (if indicated): Yes (RASS goal 0) VAP protocol (if indicated): Yes DVT prophylaxis: SCD GI prophylaxis: PPI Glucose control:  SSI Yes Central venous access:  N/A Arterial line:  N/A Foley:  Yes, and it is still needed Mobility:  bed rest  PT consulted: N/A Last date of multidisciplinary goals of care discussion [05/19/2020] Code Status:  full code Disposition: ICU  Labs   CBC: Recent Labs  Lab 05/31/2020 1240  HGB 9.5*  HCT 28.0*    Basic Metabolic Panel: Recent Labs  Lab 05/09/2020 1240  NA 141  K 6.4*  CL 111  GLUCOSE 74  BUN 27*  CREATININE 2.10*   GFR: CrCl cannot be calculated (Unknown ideal weight.). No results for input(s): PROCALCITON, WBC, LATICACIDVEN in the last 168 hours.  Liver Function Tests: No results for input(s): AST, ALT, ALKPHOS, BILITOT, PROT, ALBUMIN in the last 168 hours. No results for input(s): LIPASE, AMYLASE in the last 168 hours. No results for input(s): AMMONIA in the last 168 hours.  ABG    Component Value Date/Time   PHART 7.384 10/11/2019 0450   PCO2ART 35.5 10/11/2019 0450   PO2ART 163 (H) 10/11/2019 0450   HCO3 21.1 10/11/2019 0450   TCO2 22 05/08/2020 1240   ACIDBASEDEF 3.0 (H) 10/11/2019 0450   O2SAT 99.0 10/11/2019 0450     Coagulation Profile: No results for input(s): INR, PROTIME in the last 168 hours.  Cardiac Enzymes: No results for input(s): CKTOTAL, CKMB, CKMBINDEX, TROPONINI in the last 168 hours.  HbA1C: Hgb A1c MFr Bld  Date/Time Value Ref Range Status  05/26/2019 07:21 AM 5.9 (H) 4.8 - 5.6 % Final    Comment:    (NOTE) Pre diabetes:          5.7%-6.4% Diabetes:              >6.4% Glycemic control for   <7.0% adults with diabetes     CBG: No results for input(s): GLUCAP in the last 168 hours.  Review of  Systems:   Unable to obtain  Surgical History:   Past Surgical History:  Procedure Laterality Date  . ESOPHAGEAL BANDING  04/02/2020   Procedure: ESOPHAGEAL BANDING;  Surgeon: Kerin Salen, MD;  Location: [facility];  Service: Gastroenterology;;  placed 5 bands  . ESOPHAGOGASTRODUODENOSCOPY (EGD) WITH PROPOFOL Left 04/02/2020   Procedure: ESOPHAGOGASTRODUODENOSCOPY (EGD) WITH PROPOFOL;  Surgeon: Kerin Salen, MD;  Location: [facility];  Service: Gastroenterology;  Laterality: Left;  .  IR PARACENTESIS  07/01/2019  . IR PARACENTESIS  07/15/2019  . IR PARACENTESIS  07/27/2019  . IR PARACENTESIS  08/03/2019  . IR PARACENTESIS  08/10/2019  . IR PARACENTESIS  08/24/2019  . SURGERY SCROTAL / TESTICULAR       Social History:   reports that he has been smoking cigarettes. He has a 30.00 pack-year smoking history. He has never used smokeless tobacco. He reports current alcohol use of about 24.0 standard drinks of alcohol per week. He reports current drug use. Drugs: Marijuana and Cocaine.   Family History:  His family history includes Bipolar disorder in his father; Heart disease in his paternal grandmother.   Allergies Allergies  Allergen Reactions  . Bee Venom Anaphylaxis  . Lactose Intolerance (Gi) Diarrhea, Nausea Only and Other (See Comments)    Abdominal pain    . Ultram [Tramadol Hcl] Hives          Home Medications  Prior to Admission medications   Medication Sig Start Date End Date Taking? Authorizing Provider  Aspirin-Salicylamide-Caffeine (BC HEADACHE POWDER PO) Take 1 packet by mouth 2 (two) times daily as needed (pain/headache).    [provider]  bictegravir-emtricitabine-tenofovir AF (BIKTARVY) 50-200-25 MG TABS tablet Take 1 tablet by mouth daily. 02/14/20   Veryl Speak, FNP  Bismuth Subsalicylate (PEPTO-BISMOL PO) Take 1 tablet by mouth daily as needed (heartburn/indigestion).    [provider]  folic acid (FOLVITE) 1 MG tablet Take 1 tablet (1 mg  total) by mouth daily. 05/09/20   [name], MD  furosemide (LASIX) 20 MG tablet Take 1 tablet (20 mg total) by mouth daily. 05/09/20   [name], MD  hydrOXYzine (ATARAX/VISTARIL) 25 MG tablet Take 1 tablet (25 mg total) by mouth 3 (three) times daily as needed for anxiety. 05/09/20   [name], MD  Multiple Vitamin (MULTIVITAMIN WITH MINERALS) TABS tablet Take 1 tablet by mouth daily. 08/29/19   Azucena Fallen, MD  pantoprazole (PROTONIX) 40 MG tablet Take 1 tablet (40 mg total) by mouth daily. 05/09/20   [name], MD  propranolol (INDERAL) 10 MG tablet Take 1 tablet (10 mg total) by mouth 2 (two) times daily. 05/09/20   [name], MD  Sennosides (EX-LAX PO) Take 1 tablet by mouth daily as needed (constipation).    [provider]  spironolactone (ALDACTONE) 50 MG tablet Take 1 tablet (50 mg total) by mouth daily. 05/09/20   [name], MD  tamsulosin (FLOMAX) 0.4 MG CAPS capsule Take 1 capsule (0.4 mg total) by mouth daily. 05/09/20   [name], MD  thiamine 100 MG tablet Take 1 tablet (100 mg total) by mouth daily. Patient not taking: No sig reported 08/29/19   Azucena Fallen, MD  traZODone (DESYREL) 100 MG tablet Take 1.5 tablets (150 mg total) by mouth at bedtime as needed for sleep. 05/09/20   [name], MD     Critical care time: 41 minutes  Coralyn Helling, MD [facility] Pulmonary/Critical Care Pager - [phone] 05/21/2020, 1:48 PM

## 2020-05-30 ENCOUNTER — Inpatient Hospital Stay (HOSPITAL_COMMUNITY): Payer: Medicaid Other

## 2020-05-30 DIAGNOSIS — R0603 Acute respiratory distress: Secondary | ICD-10-CM

## 2020-05-30 LAB — POCT I-STAT 7, (LYTES, BLD GAS, ICA,H+H)
Acid-base deficit: 2 mmol/L (ref 0.0–2.0)
Bicarbonate: 21.9 mmol/L (ref 20.0–28.0)
Calcium, Ion: 0.95 mmol/L — ABNORMAL LOW (ref 1.15–1.40)
HCT: 35 % — ABNORMAL LOW (ref 39.0–52.0)
Hemoglobin: 11.9 g/dL — ABNORMAL LOW (ref 13.0–17.0)
O2 Saturation: 98 %
Patient temperature: 96.6
Potassium: 3.8 mmol/L (ref 3.5–5.1)
Sodium: 142 mmol/L (ref 135–145)
TCO2: 23 mmol/L (ref 22–32)
pCO2 arterial: 31.5 mmHg — ABNORMAL LOW (ref 32.0–48.0)
pH, Arterial: 7.445 (ref 7.350–7.450)
pO2, Arterial: 102 mmHg (ref 83.0–108.0)

## 2020-05-30 LAB — CBC WITH DIFFERENTIAL/PLATELET
Abs Immature Granulocytes: 0.03 10*3/uL (ref 0.00–0.07)
Abs Immature Granulocytes: 0 10*3/uL (ref 0.00–0.07)
Basophils Absolute: 0 10*3/uL (ref 0.0–0.1)
Basophils Absolute: 0.1 10*3/uL (ref 0.0–0.1)
Basophils Relative: 0 %
Basophils Relative: 1 %
Eosinophils Absolute: 0 10*3/uL (ref 0.0–0.5)
Eosinophils Absolute: 0 10*3/uL (ref 0.0–0.5)
Eosinophils Relative: 0 %
Eosinophils Relative: 0 %
HCT: 35.3 % — ABNORMAL LOW (ref 39.0–52.0)
HCT: 32.5 % — ABNORMAL LOW (ref 39.0–52.0)
Hemoglobin: 10.9 g/dL — ABNORMAL LOW (ref 13.0–17.0)
Hemoglobin: 10.7 g/dL — ABNORMAL LOW (ref 13.0–17.0)
Immature Granulocytes: 0 %
Lymphocytes Relative: 11 %
Lymphocytes Relative: 13 %
Lymphs Abs: 1.1 10*3/uL (ref 0.7–4.0)
Lymphs Abs: 1.4 10*3/uL (ref 0.7–4.0)
MCH: 24.7 pg — ABNORMAL LOW (ref 26.0–34.0)
MCH: 25.7 pg — ABNORMAL LOW (ref 26.0–34.0)
MCHC: 30.9 g/dL (ref 30.0–36.0)
MCHC: 32.9 g/dL (ref 30.0–36.0)
MCV: 79.9 fL — ABNORMAL LOW (ref 80.0–100.0)
MCV: 78.1 fL — ABNORMAL LOW (ref 80.0–100.0)
Monocytes Absolute: 0.4 10*3/uL (ref 0.1–1.0)
Monocytes Absolute: 0 10*3/uL — ABNORMAL LOW (ref 0.1–1.0)
Monocytes Relative: 4 %
Monocytes Relative: 0 %
Neutro Abs: 8.4 10*3/uL — ABNORMAL HIGH (ref 1.7–7.7)
Neutro Abs: 9.1 10*3/uL — ABNORMAL HIGH (ref 1.7–7.7)
Neutrophils Relative %: 85 %
Neutrophils Relative %: 86 %
Platelets: 142 10*3/uL — ABNORMAL LOW (ref 150–400)
Platelets: 135 10*3/uL — ABNORMAL LOW (ref 150–400)
RBC: 4.42 MIL/uL (ref 4.22–5.81)
RBC: 4.16 MIL/uL — ABNORMAL LOW (ref 4.22–5.81)
RDW: 21.3 % — ABNORMAL HIGH (ref 11.5–15.5)
RDW: 21.4 % — ABNORMAL HIGH (ref 11.5–15.5)
WBC Morphology: INCREASED
WBC: 10 10*3/uL (ref 4.0–10.5)
WBC: 10.6 10*3/uL — ABNORMAL HIGH (ref 4.0–10.5)
nRBC: 0 % (ref 0.0–0.2)
nRBC: 0 % (ref 0.0–0.2)
nRBC: 0 /100 WBC

## 2020-05-30 LAB — CK: Total CK: 352 U/L (ref 49–397)

## 2020-05-30 LAB — BASIC METABOLIC PANEL
Anion gap: 12 (ref 5–15)
BUN: 29 mg/dL — ABNORMAL HIGH (ref 6–20)
CO2: 20 mmol/L — ABNORMAL LOW (ref 22–32)
Calcium: 7.4 mg/dL — ABNORMAL LOW (ref 8.9–10.3)
Chloride: 107 mmol/L (ref 98–111)
Creatinine, Ser: 1.76 mg/dL — ABNORMAL HIGH (ref 0.61–1.24)
GFR, Estimated: 48 mL/min — ABNORMAL LOW (ref >60)
Glucose, Bld: 180 mg/dL — ABNORMAL HIGH (ref 70–99)
Potassium: 3.7 mmol/L (ref 3.5–5.1)
Sodium: 139 mmol/L (ref 135–145)

## 2020-05-30 LAB — GLUCOSE, CAPILLARY
Glucose-Capillary: 147 mg/dL — ABNORMAL HIGH (ref 70–99)
Glucose-Capillary: 150 mg/dL — ABNORMAL HIGH (ref 70–99)
Glucose-Capillary: 168 mg/dL — ABNORMAL HIGH (ref 70–99)
Glucose-Capillary: 177 mg/dL — ABNORMAL HIGH (ref 70–99)
Glucose-Capillary: 187 mg/dL — ABNORMAL HIGH (ref 70–99)
Glucose-Capillary: 195 mg/dL — ABNORMAL HIGH (ref 70–99)
Glucose-Capillary: 210 mg/dL — ABNORMAL HIGH (ref 70–99)

## 2020-05-30 LAB — ECHOCARDIOGRAM COMPLETE
Area-P 1/2: 2.73 cm2
Height: 73 in
S' Lateral: 4.4 cm
Weight: 3174.62 oz

## 2020-05-30 LAB — TRIGLYCERIDES: Triglycerides: 272 mg/dL — ABNORMAL HIGH (ref <150)

## 2020-05-30 LAB — PHOSPHORUS: Phosphorus: 3.6 mg/dL (ref 2.5–4.6)

## 2020-05-30 LAB — AMMONIA: Ammonia: 123 umol/L — ABNORMAL HIGH (ref 9–35)

## 2020-05-30 LAB — MAGNESIUM: Magnesium: 1.7 mg/dL (ref 1.7–2.4)

## 2020-05-30 LAB — PROTIME-INR
INR: 1.5 — ABNORMAL HIGH (ref 0.8–1.2)
Prothrombin Time: 17.4 seconds — ABNORMAL HIGH (ref 11.4–15.2)

## 2020-05-30 MED ORDER — SODIUM CHLORIDE 0.9% FLUSH: 10.0000 mL | INTRAVENOUS | Status: DC | PRN

## 2020-05-30 MED ORDER — CALCIUM GLUCONATE-NACL 2-0.675 GM/100ML-% IV SOLN
2.0000 g | Freq: Once | INTRAVENOUS | Status: AC
  Administered 2020-05-30: 2000 mg via INTRAVENOUS
  Filled 2020-05-30: qty 100

## 2020-05-30 MED ORDER — SODIUM CHLORIDE 0.9% FLUSH: 10.0000 mL | Freq: Two times a day (BID) | INTRAVENOUS | Status: DC

## 2020-05-30 NOTE — Progress Notes (Signed)
Patient sister Shanda Bumps took home patients 2 silver loops earrings

## 2020-05-30 NOTE — Procedures (Signed)
EEG Procedure CPT/Type of Study: 95720; 24hr EEG with video Referring Provider: Craige Cotta Primary Neurological Diagnosis: cardiac arrest  History: This is a 45 yr old patient, undergoing an EEG to evaluate for cardiac arrest, myoclonus. Clinical State: obtunded  Technical Description:  The EEG was performed using standard setting per the guidelines of American Clinical Neurophysiology Society (ACNS).  A minimum of 21 electrodes were placed on scalp according to the International 10-20 or/and 10-10 Systems. Supplemental electrodes were placed as needed. Single EKG electrode was also used to detect cardiac arrhythmia. Patient's behavior was continuously recorded on video simultaneously with EEG. A minimum of 16 channels were used for data display. Each epoch of study was reviewed manually daily and as needed using standard referential and bipolar montages. Computerized quantitative EEG analysis (such as compressed spectral array analysis, trending, automated spike & seizure detection) were used as indicated.   Day 1: from 1908 05/28/2020 to 0730 05/30/20  EEG Description: Overall Amplitude: suppressed Predominant Frequency: The background activity showed a burst-suppression pattern with low voltage suppression and bursts of irregular 1-4Hz  activity every 2-10 seconds. Superimposed Frequencies: none The background was symmetric  Background Abnormalities: Other; burst suppression pattern as above Rhythmic or periodic pattern: No Epileptiform activity: no Electrographic seizures: no Events: Yes; occasional myoclonic jerking of the head occurring with bursts of activity  Breach rhythm: no  Reactivity: absent  Stimulation procedures:  Hyperventilation: not done Photic stimulation: no change  Sleep Background: none  EKG:no significant arrhythmia  Impression: This was a markedly abnormal continuous video EEG due to a burst-suppression pattern, absent reactivity, and anoxic myoclonus. This was  consistent with a severe anoxic injury pattern.

## 2020-05-30 NOTE — Procedures (Signed)
ROUTINE EEG REPORT REFERRING PROVIDER:  Coralyn Helling, MD DATE OF STUDY:  06-02-20 EEG #: [id number]  HISTORY: 45yo man with history of alcoholic cirrhosis status post out of hospital cardiac arrest with 25 minutes of CPR prior to ROSC.  DESCRIPTION: During maximal wakefulness, a clear background was not identified.  There were superimposed bursts of fast activity between 10-16 Hz in a waxing/waning fashion that could represent EMG artifact with underlying suppression (< 10uV) seen.  Reactivity was absent. No sleep structures were seen There were frequent irregular intermittent myoclonic jerks seen on video corresponding to large bursts of EMG activity primarily of the trunk, neck, and head.   CLASSIFICATION (EEG IMPRESSION): Abnormal Significance III (coma) 1. Background suppression 2. Post anoxic myoclonus  CLINICAL INTERPRETATION: There is evidence of a very severe encephalopathy along with frequent myoclonic jerks indicative of anoxic brain injury, with possible contribution of sedating medications.  See separate EEG report for 24hr video EEG in progress.  Lakelyn Straus A. Senaida Ores, MD Neurology and Clinical Neurophysiology

## 2020-05-30 NOTE — Progress Notes (Signed)
maint complete.  

## 2020-05-30 NOTE — Progress Notes (Signed)
NAME:  Thomas Atkins, MRN:  [id number], DOB:  04-29-1975, LOS: 1 ADMISSION DATE:  06-02-20, CONSULTATION DATE:  2020/06/02 REFERRING MD:  Dr. Rodena Medin, ER, CHIEF COMPLAINT:  Cardiac arrest   History of Present Illness:  45 yo male smoker found by roommate unresponsive in his bedroom  Last seen normal at 3 am on 2020/06/02.  EMS noted pt had white bag of powder next to him in bed.  He was found to be in asystole.  He had CPR for approximately 25 minutes before ROSC.  He has hx of ETOH with cirrhosis and varices, and HIV.  Hx from family, medical team and chart review  Pertinent  Medical History  ETOH with cirrhosis and varices, HIV, Depression, Anxiety, Substance abuse, Pectus deformity  Significant Hospital Events: Including procedures, antibiotic start and stop dates in addition to other pertinent events   . Admit, TTM  Interim History / Subjective:  Remains sedated currently on TTM having myoclonic jerking activity  Objective   Blood pressure 133/80, pulse 75, temperature (!) 96.8 F (36 C), temperature source Bladder, resp. rate (!) 30, height 6\' 1"  (1.854 m), weight 90 kg, SpO2 93 %.    Vent Mode: PRVC FiO2 (%):  [30 %-100 %] 50 % Set Rate:  [16 bmp-28 bmp] 28 bmp Vt Set:  [620 mL-630 mL] 620 mL PEEP:  [5 cmH20] 5 cmH20 Plateau Pressure:  [21 cmH20-32 cmH20] 24 cmH20   Intake/Output Summary (Last 24 hours) at 05/30/2020 0816 Last data filed at 05/30/2020 0700 Gross per 24 hour  Intake 2852.5 ml  Output 1850 ml  Net 1002.5 ml   Filed Weights   June 02, 2020 1313  Weight: 90 kg    Examination: General: Middle-age male sedated on full mechanical ventilatory support HEENT: Endotracheal tube, gastric tube in place.  No JVD is appreciated Neuro: Negative gag reflex, negative corneal reflex, negative doll's eyes he is overbreathing the vent. CV: Heart sounds are regular PULM: Coarse rhonchi bilaterally Vent pressure regulated volume control FIO2 50% PEEP 8 RATE 28 VT 620  cc  GI: Firm, no bowel sounds.  Gastric tube with old blood GU: Foley with amber urine Extremities: warm/dry, negative edema  Skin: no rashes or lesions      Labs/imaging that I havepersonally reviewed    CMP Latest Ref Rng & Units 05/30/2020 05/30/2020 Jun 02, 2020  Glucose 70 - 99 mg/dL - 05/31/2020) 409(W)  BUN 6 - 20 mg/dL - 119(J) -  Creatinine 47(W - 1.24 mg/dL - 2.95) -  Sodium 6.21(H - 145 mmol/L 142 139 -  Potassium 3.5 - 5.1 mmol/L 3.8 3.7 -  Chloride 98 - 111 mmol/L - 107 -  CO2 22 - 32 mmol/L - 20(L) -  Calcium 8.9 - 10.3 mg/dL - 7.4(L) -  Total Protein 6.5 - 8.1 g/dL - - 7.5  Total Bilirubin 0.3 - 1.2 mg/dL - - 2.0(H)  Alkaline Phos 38 - 126 U/L - - 150(H)  AST 15 - 41 U/L - - 502(H)  ALT 0 - 44 U/L - - 297(H)    CBC Latest Ref Rng & Units 05/30/2020 05/30/2020 05/30/2020  WBC 4.0 - 10.5 K/uL - 10.6(H) 10.0  Hemoglobin 13.0 - 17.0 g/dL 11.9(L) 10.7(L) 10.9(L)  Hematocrit 39.0 - 52.0 % 35.0(L) 32.5(L) 35.3(L)  Platelets 150 - 400 K/uL - 135(L) 142(L)    ABG    Component Value Date/Time   PHART 7.445 05/30/2020 0357   PCO2ART 31.5 (L) 05/30/2020 0357   PO2ART 102 05/30/2020 0357  HCO3 21.9 05/30/2020 0357   TCO2 23 05/30/2020 0357   ACIDBASEDEF 2.0 05/30/2020 0357   O2SAT 98.0 05/30/2020 0357    Resolved Hospital Problem list     Assessment & Plan:   Asystolic cardiac arrest likely from accidental drug overdose. TTM to 37 degrees, start swarming 05/30/2020 1800 hrs. Monitor on telemetry 2D echo pending    Acute anoxic encephalopathy after cardiac arrest. Hx of anxiety, depression, ETOH, polysubstance abuse. CT of the head 322 essentially negative Currently having EEG done Continue thiamine and folic acid Drug screen for cocaine and narcotics      Aspiration pneumonitis. Continue Unasyn Monitor culture data  Alcoholic cirrhosis with varices and ascites. Monitor for bleeding NG tube Protonix and octreotide Holding outpatient Lasix, Inderal,  Aldactone    AKI from ATN 2nd to cardiac arrest. Metabolic acidosis with lactic acidosis. Hyperkalemia. Lab Results  Component Value Date   CREATININE 1.76 (H) 05/30/2020   CREATININE 1.81 (H) 06-28-2020   CREATININE 2.10 (H) 2020-06-28   CREATININE 1.00 02/14/2020   CREATININE 0.86 06/08/2019   Recent Labs  Lab 28-Jun-2020 1918 05/30/20 0017 05/30/20 0357  K 4.8 3.7 3.8   Bicarbonate 50 cc an hour Serial chemistries Check ammonia level Monitor urine output  ABGs   Acute hypoxic respiratory failure in setting of cardiac arrest.  With apparent aspiration Event protocol Serial chest x-ray Follow ABG O2 sats goal of greater than 92% Empirical Unasyn   Shock.  Complicated by sepsis and cardiogenic Wean pressors as tolerated Fluids as needed Correct acidosis   Anemia of critical illness, chronic disease, and possible recurrent upper GI bleeding. Recent Labs    05/30/20 0353 05/30/20 0357  HGB 10.7* 11.9*   Transfuse per protocol Monitor for bleeding  Hx of HIV. Hold outpatient [employer] practice (evaluated daily)  Diet:  NPO Pain/Anxiety/Delirium protocol (if indicated): Yes (RASS goal 0) VAP protocol (if indicated): Yes DVT prophylaxis: SCD GI prophylaxis: PPI Glucose control:  SSI Yes Central venous access:  N/A Arterial line:  N/A Foley:  Yes, and it is still needed Mobility:  bed rest  PT consulted: N/A Last date of multidisciplinary goals of care discussion [June 28, 2020] Code Status:  full code Disposition: ICU  Labs   CBC: Recent Labs  Lab 28-Jun-2020 1234 2020/06/28 1240 2020/06/28 1639 June 28, 2020 1918 05/30/20 0017 05/30/20 0353 05/30/20 0357  WBC 17.7*  --   --  10.2 10.0 10.6*  --   NEUTROABS 14.1*  --   --  9.4* 8.4* 9.1*  --   HGB 8.1*   < > 12.9* 11.6* 10.9* 10.7* 11.9*  HCT 29.3*   < > 38.0* 38.0* 35.3* 32.5* 35.0*  MCV 88.3  --   --  81.2 79.9* 78.1*  --   PLT 241  --   --  187 142* 135*  --    < > = values in this interval  not displayed.    Basic Metabolic Panel: Recent Labs  Lab 06/28/20 1234 06/28/2020 1240 June 28, 2020 1400 2020/06/28 1639 06-28-2020 1918 06-28-20 2055 05/30/20 0017 05/30/20 0353 05/30/20 0357  NA 140 141 139 141 134*  --  139  --  142  K 6.5* 6.4* 6.7* 5.8* 4.8  --  3.7  --  3.8  CL 107 111  --   --  104  --  107  --   --   CO2 18*  --   --   --  17*  --  20*  --   --  GLUCOSE 75 74  --   --  140* 145* 180*  --   --   BUN 22* 27*  --   --  29*  --  29*  --   --   CREATININE 2.26* 2.10*  --   --  1.81*  --  1.76*  --   --   CALCIUM 8.5*  --   --   --  7.4*  --  7.4*  --   --   MG  --   --   --   --   --   --   --  1.7  --   PHOS  --   --   --   --   --   --   --  3.6  --    GFR: Estimated Creatinine Clearance: 59.3 mL/min (A) (by C-G formula based on SCr of 1.76 mg/dL (H)). Recent Labs  Lab 06/03/2020 1234 05/19/2020 1326 05/15/2020 1545 05/30/2020 1918 05/30/20 0017 05/30/20 0353  WBC 17.7*  --   --  10.2 10.0 10.6*  LATICACIDVEN  --  7.5* 3.3*  --   --   --     Liver Function Tests: Recent Labs  Lab 05/16/2020 1234 06/02/2020 2055  AST 178* 502*  ALT 109* 297*  ALKPHOS 144* 150*  BILITOT 0.9 2.0*  PROT 7.3 7.5  ALBUMIN 3.4* 3.2*   No results for input(s): LIPASE, AMYLASE in the last 168 hours. No results for input(s): AMMONIA in the last 168 hours.  ABG    Component Value Date/Time   PHART 7.445 05/30/2020 0357   PCO2ART 31.5 (L) 05/30/2020 0357   PO2ART 102 05/30/2020 0357   HCO3 21.9 05/30/2020 0357   TCO2 23 05/30/2020 0357   ACIDBASEDEF 2.0 05/30/2020 0357   O2SAT 98.0 05/30/2020 0357     Coagulation Profile: Recent Labs  Lab 05/08/2020 1234 05/12/2020 1918 05/30/20 0017  INR 1.5* 1.5* 1.5*    Cardiac Enzymes: Recent Labs  Lab 05/28/2020 1234 05/12/2020 1918 05/30/20 0353  CKTOTAL 52 236 352    HbA1C: Hgb A1c MFr Bld  Date/Time Value Ref Range Status  05/21/2020 07:18 PM 5.8 (H) 4.8 - 5.6 % Final    Comment:    (NOTE) Pre diabetes:           5.7%-6.4%  Diabetes:              >6.4%  Glycemic control for   <7.0% adults with diabetes   05/26/2019 07:21 AM 5.9 (H) 4.8 - 5.6 % Final    Comment:    (NOTE) Pre diabetes:          5.7%-6.4% Diabetes:              >6.4% Glycemic control for   <7.0% adults with diabetes     CBG: Recent Labs  Lab 05/08/2020 1817 05/26/2020 1916 05/30/20 0015 05/30/20 0353 05/30/20 0702  GLUCAP 127* 140* 168* 210* 187*       Critical care time: 41 minutes  Brett Canales Minor ACNP Acute Care Nurse Practitioner [facility]/Critical [facility] Please consult Amion 05/30/2020, 8:18 AM

## 2020-05-31 ENCOUNTER — Telehealth: Payer: Self-pay | Admitting: Internal Medicine

## 2020-05-31 ENCOUNTER — Inpatient Hospital Stay (HOSPITAL_COMMUNITY): Payer: Medicaid Other

## 2020-05-31 DIAGNOSIS — J9601 Acute respiratory failure with hypoxia: Secondary | ICD-10-CM

## 2020-05-31 DIAGNOSIS — J9602 Acute respiratory failure with hypercapnia: Secondary | ICD-10-CM

## 2020-05-31 LAB — BASIC METABOLIC PANEL
Anion gap: 9 (ref 5–15)
Anion gap: 7 (ref 5–15)
BUN: 22 mg/dL — ABNORMAL HIGH (ref 6–20)
BUN: 25 mg/dL — ABNORMAL HIGH (ref 6–20)
CO2: 29 mmol/L (ref 22–32)
CO2: 28 mmol/L (ref 22–32)
Calcium: 8.3 mg/dL — ABNORMAL LOW (ref 8.9–10.3)
Calcium: 8.3 mg/dL — ABNORMAL LOW (ref 8.9–10.3)
Chloride: 102 mmol/L (ref 98–111)
Chloride: 106 mmol/L (ref 98–111)
Creatinine, Ser: 1.39 mg/dL — ABNORMAL HIGH (ref 0.61–1.24)
Creatinine, Ser: 1.34 mg/dL — ABNORMAL HIGH (ref 0.61–1.24)
GFR, Estimated: >60 mL/min (ref >60)
GFR, Estimated: >60 mL/min (ref >60)
Glucose, Bld: 171 mg/dL — ABNORMAL HIGH (ref 70–99)
Glucose, Bld: 98 mg/dL (ref 70–99)
Potassium: 2.9 mmol/L — ABNORMAL LOW (ref 3.5–5.1)
Potassium: 3.5 mmol/L (ref 3.5–5.1)
Sodium: 140 mmol/L (ref 135–145)
Sodium: 141 mmol/L (ref 135–145)

## 2020-05-31 LAB — CBC WITH DIFFERENTIAL/PLATELET
Abs Immature Granulocytes: 0.11 10*3/uL — ABNORMAL HIGH (ref 0.00–0.07)
Basophils Absolute: 0 10*3/uL (ref 0.0–0.1)
Basophils Relative: 0 %
Eosinophils Absolute: 0.1 10*3/uL (ref 0.0–0.5)
Eosinophils Relative: 1 %
HCT: 33.1 % — ABNORMAL LOW (ref 39.0–52.0)
Hemoglobin: 10.6 g/dL — ABNORMAL LOW (ref 13.0–17.0)
Immature Granulocytes: 1 %
Lymphocytes Relative: 11 %
Lymphs Abs: 1.7 10*3/uL (ref 0.7–4.0)
MCH: 24.8 pg — ABNORMAL LOW (ref 26.0–34.0)
MCHC: 32 g/dL (ref 30.0–36.0)
MCV: 77.3 fL — ABNORMAL LOW (ref 80.0–100.0)
Monocytes Absolute: 0.8 10*3/uL (ref 0.1–1.0)
Monocytes Relative: 5 %
Neutro Abs: 12.1 10*3/uL — ABNORMAL HIGH (ref 1.7–7.7)
Neutrophils Relative %: 82 %
Platelets: 141 10*3/uL — ABNORMAL LOW (ref 150–400)
RBC: 4.28 MIL/uL (ref 4.22–5.81)
RDW: 22.1 % — ABNORMAL HIGH (ref 11.5–15.5)
WBC: 15 10*3/uL — ABNORMAL HIGH (ref 4.0–10.5)
nRBC: 0 % (ref 0.0–0.2)

## 2020-05-31 LAB — MYOGLOBIN, URINE: Myoglobin, Ur: 2 ng/mL (ref 0–13)

## 2020-05-31 LAB — GLUCOSE, CAPILLARY
Glucose-Capillary: 103 mg/dL — ABNORMAL HIGH (ref 70–99)
Glucose-Capillary: 116 mg/dL — ABNORMAL HIGH (ref 70–99)
Glucose-Capillary: 132 mg/dL — ABNORMAL HIGH (ref 70–99)
Glucose-Capillary: 171 mg/dL — ABNORMAL HIGH (ref 70–99)
Glucose-Capillary: 81 mg/dL (ref 70–99)
Glucose-Capillary: 93 mg/dL (ref 70–99)

## 2020-05-31 LAB — TRIGLYCERIDES: Triglycerides: 368 mg/dL — ABNORMAL HIGH (ref <150)

## 2020-05-31 LAB — PHOSPHORUS: Phosphorus: 2.5 mg/dL (ref 2.5–4.6)

## 2020-05-31 LAB — MAGNESIUM: Magnesium: 2.1 mg/dL (ref 1.7–2.4)

## 2020-05-31 MED ORDER — FENTANYL 2500MCG IN NS 250ML (10MCG/ML) PREMIX INFUSION
25.0000 ug/h | INTRAVENOUS | Status: DC
  Administered 2020-05-31: 25 ug/h via INTRAVENOUS
  Filled 2020-05-31: qty 250

## 2020-05-31 MED ORDER — POTASSIUM PHOSPHATES 15 MMOLE/5ML IV SOLN
15.0000 mmol | Freq: Once | INTRAVENOUS | Status: AC
  Administered 2020-05-31: 15 mmol via INTRAVENOUS
  Filled 2020-05-31: qty 5

## 2020-05-31 MED ORDER — POTASSIUM CHLORIDE 10 MEQ/50ML IV SOLN
10.0000 meq | INTRAVENOUS | Status: AC
  Administered 2020-05-31 (×4): 10 meq via INTRAVENOUS
  Filled 2020-05-31 (×3): qty 50

## 2020-05-31 MED ORDER — FENTANYL CITRATE (PF) 100 MCG/2ML IJ SOLN
50.0000 ug | Freq: Once | INTRAMUSCULAR | Status: AC
  Administered 2020-05-31: 50 ug via INTRAVENOUS
  Filled 2020-05-31: qty 2

## 2020-05-31 MED ORDER — FENTANYL BOLUS VIA INFUSION
50.0000 ug | INTRAVENOUS | Status: DC | PRN
  Administered 2020-05-31 – 2020-06-01 (×3): 100 ug via INTRAVENOUS
  Filled 2020-05-31: qty 100

## 2020-05-31 MED ORDER — POTASSIUM CHLORIDE 20 MEQ PO PACK
40.0000 meq | PACK | Freq: Once | ORAL | Status: AC
  Administered 2020-05-31: 40 meq
  Filled 2020-05-31: qty 2

## 2020-05-31 NOTE — Telephone Encounter (Signed)
Urgent*Son in Coma  Mom, Marylu Lund, Wants PCP to know he is not doing well and is asking if PCP can call asap possible. [phone]

## 2020-05-31 NOTE — Plan of Care (Signed)
  Problem: Clinical Measurements: Goal: Diagnostic test results will improve Outcome: Progressing   Problem: Elimination: Goal: Will not experience complications related to bowel motility Outcome: Progressing Goal: Will not experience complications related to urinary retention Outcome: Progressing   Problem: Pain Managment: Goal: General experience of comfort will improve Outcome: Progressing   Problem: Safety: Goal: Ability to remain free from injury will improve Outcome: Progressing   Problem: Activity: Goal: Ability to tolerate increased activity will improve Outcome: Progressing

## 2020-05-31 NOTE — Procedures (Signed)
EEG Procedure CPT/Type of Study: 95720; 24hr EEG with video Referring Provider: Craige Cotta Primary Neurological Diagnosis: cardiac arrest  History: This is a 45 yr old patient, undergoing an EEG to evaluate for cardiac arrest, myoclonus. Clinical State: obtunded  Technical Description:  The EEG was performed using standard setting per the guidelines of American Clinical Neurophysiology Society (ACNS).  A minimum of 21 electrodes were placed on scalp according to the International 10-20 or/and 10-10 Systems. Supplemental electrodes were placed as needed. Single EKG electrode was also used to detect cardiac arrhythmia. Patient's behavior was continuously recorded on video simultaneously with EEG. A minimum of 16 channels were used for data display. Each epoch of study was reviewed manually daily and as needed using standard referential and bipolar montages. Computerized quantitative EEG analysis (such as compressed spectral array analysis, trending, automated spike & seizure detection) were used as indicated.   Day 2: from 0730 05/30/20 to 0730 05/31/20  EEG Description: Overall Amplitude: suppressed Predominant Frequency: The background activity showed a burst-suppression pattern initially, later near complete background suppression. Superimposed Frequencies: none The background was symmetric  Background Abnormalities: Other; burst-suppression pattern as above Rhythmic or periodic pattern: No Epileptiform activity: no Electrographic seizures: no Events: Yes; some jerking movements of head and torso without EEG correlate  Breach rhythm: no  Reactivity: absent  Stimulation procedures:  Hyperventilation: not done Photic stimulation: not done  Sleep Background: none  EKG:no significant arrhythmia  Impression: This was a markedly abnormal continuous video EEG due to near complete background suppression and anoxic myoclonic jerking, consistent with a severe anoxic injury pattern.

## 2020-05-31 NOTE — Progress Notes (Addendum)
NAME:  Thomas Atkins, MRN:  [id number], DOB:  10-19-1975, LOS: 2 ADMISSION DATE:  05/08/2020, CONSULTATION DATE:  05/20/2020 REFERRING MD:  Dr. Rodena Medin, ER, CHIEF COMPLAINT:  Cardiac arrest   History of Present Illness:  45 yo male smoker found by roommate unresponsive in his bedroom  Last seen normal at 3 am on 05/12/2020.  EMS noted pt had white bag of powder next to him in bed.  He was found to be in asystole.  He had CPR for approximately 25 minutes before ROSC.  He has hx of ETOH with cirrhosis and varices, and HIV.  Hx from family, medical team and chart review  Pertinent  Medical History  ETOH with cirrhosis and varices, HIV, Depression, Anxiety, Substance abuse, Pectus deformity  Significant Hospital Events: Including procedures, antibiotic start and stop dates in addition to other pertinent events   . Admit, TTM  Interim History / Subjective:   Pt's father and roommate at bedside. On TTM this morning. Myoclonic jerking controled with propofol. Pt remains unresponsive this morning. PRVC, FiO2 40%, Peep 8  Objective   Blood pressure 129/75, pulse 79, temperature 99 F (37.2 C), temperature source Bladder, resp. rate (!) 25, height 6\' 1"  (1.854 m), weight 90 kg, SpO2 95 %.    Vent Mode: PRVC FiO2 (%):  [40 %-50 %] 40 % Set Rate:  [28 bmp] 28 bmp Vt Set:  [620 mL] 620 mL PEEP:  [8 cmH20] 8 cmH20 Plateau Pressure:  [27 cmH20-32 cmH20] 28 cmH20   Intake/Output Summary (Last 24 hours) at 05/31/2020 0940 Last data filed at 05/31/2020 0818 Gross per 24 hour  Intake 2173.1 ml  Output 2735 ml  Net -561.9 ml   Filed Weights   05/31/2020 1313  Weight: 90 kg    Examination: General: Acutely ill appearing middle-age male, sedated, mechanically ventilated HEENT: ETT and gastric tube in place, scant, clear, thick drainage from eyes bilaterally Neuro: Unresponsive to noxious stimuli, dysconjugate horizontal gaze, negative corneal reflex, fixed 1-2 mm pupils CV: RRR, normal S1S2, no  MRG PULM: course rhonchi bilaterally, mechanically ventilated GI: soft, no bowel sounds, cooling pads in place GU: Foley in place Extremities: warm, 1+ dorsalis pedis and radial pulses bilaterally, myoclonic jerking with dorsiflexion of foot Skin: no rashes or lesions  Labs/imaging that I havepersonally reviewed    CMP Latest Ref Rng & Units 05/31/2020 05/30/2020 05/30/2020  Glucose 70 - 99 mg/dL 05/28/2020) - 301(S)  BUN 6 - 20 mg/dL 010(X) - 32(T)  Creatinine 0.61 - 1.24 mg/dL 55(D) - 3.22(G)  Sodium 135 - 145 mmol/L 140 142 139  Potassium 3.5 - 5.1 mmol/L 2.9(L) 3.8 3.7  Chloride 98 - 111 mmol/L 102 - 107  CO2 22 - 32 mmol/L 29 - 20(L)  Calcium 8.9 - 10.3 mg/dL 8.3(L) - 7.4(L)  Total Protein 6.5 - 8.1 g/dL - - -  Total Bilirubin 0.3 - 1.2 mg/dL - - -  Alkaline Phos 38 - 126 U/L - - -  AST 15 - 41 U/L - - -  ALT 0 - 44 U/L - - -    CBC Latest Ref Rng & Units 05/31/2020 05/30/2020 05/30/2020  WBC 4.0 - 10.5 K/uL 15.0(H) - 10.6(H)  Hemoglobin 13.0 - 17.0 g/dL 10.6(L) 11.9(L) 10.7(L)  Hematocrit 39.0 - 52.0 % 33.1(L) 35.0(L) 32.5(L)  Platelets 150 - 400 K/uL 141(L) - 135(L)    ABG    Component Value Date/Time   PHART 7.445 05/30/2020 0357   PCO2ART 31.5 (L) 05/30/2020 05/15/2020  PO2ART 102 05/30/2020 0357   HCO3 21.9 05/30/2020 0357   TCO2 23 05/30/2020 0357   ACIDBASEDEF 2.0 05/30/2020 0357   O2SAT 98.0 05/30/2020 0357    Resolved Hospital Problem list     Assessment & Plan:  Thomas Atkins is a 45 yo male who presented after cardiac arrest at home with unknown downtime who was found to have a severe anoxic brian injury and multiorgan system failure. Pending discussion with family he will likely be transitioned to comfort care within the coming days.  Asystolic cardiac arrest likely from accidental drug overdose. Cardiogenic Shock - UDS positive for Cocaine and Opiates - TTM to 37 degrees - echo overall normal - off pressors since yesterday afternoon - telemetry  Acute  anoxic encephalopathy after cardiac arrest. Hx of anxiety, depression, ETOH, polysubstance abuse. - unresponsive to stimuli with poor prognosis - EEG with near complete background suppression and myoclonic jerking - cont thiamine and folic acid - cont propofol for myoclonic jerk suppression, consider switching to Versed - f/u triglycerides  Acute hypoxic respiratory failure in setting of cardiac arrest.  Aspiration Pneumonia vs pneumonitis - improving bilateral infiltrates on CXR - PRVC, FiO2 40%, Peep 8 - SpO2 goal > 92% - cont Unasyn  Alcoholic cirrhosis with varices and ascites. Upper GI Bleed - s/p octreotide - cont protonix - hgb stable - monitor OG output - hold lasix, inderal, aldactone  Multiorgan System Failure 2/2 Cardiac Arrest AKI from ATN 2nd to cardiac arrest. Acute Liver Failure - sCr imrpoving - 2.1 L UOP yesterady - Ammonia 123, INR 1.5, AST 502, ALT 297, T bili 2.0 - follow I&O, BMP  Metabolic acidosis with lactic acidosis. - Likely secondary to acute kidney failure and tissue hypoxia - bicarb 29 - d/c Na Bicarb - f/u BMP  Hypokalemia Hypophosphatemia - K 2.9, repleted with 4 runs IV and 50 mEq per tube - 15 mEq K-Phos IV - f/u BMP  HIV - hold outpatient [employer]  Best practice (evaluated daily)  Diet:  NPO Pain/Anxiety/Delirium protocol (if indicated): Yes (RASS goal 0) VAP protocol (if indicated): Yes DVT prophylaxis: SCD GI prophylaxis: PPI Glucose control:  SSI Yes Central venous access:  N/A Arterial line:  N/A Foley:  Yes, and it is still needed Mobility:  bed rest  PT consulted: N/A Last date of multidisciplinary goals of care discussion [06/03/2020] Code Status:  full code Disposition: ICU  Labs   CBC: Recent Labs  Lab 06/02/2020 1234 05/08/2020 1240 05/09/2020 1918 05/30/20 0017 05/30/20 0353 05/30/20 0357 05/31/20 0339  WBC 17.7*  --  10.2 10.0 10.6*  --  15.0*  NEUTROABS 14.1*  --  9.4* 8.4* 9.1*  --  12.1*  HGB 8.1*   < >  11.6* 10.9* 10.7* 11.9* 10.6*  HCT 29.3*   < > 38.0* 35.3* 32.5* 35.0* 33.1*  MCV 88.3  --  81.2 79.9* 78.1*  --  77.3*  PLT 241  --  187 142* 135*  --  141*   < > = values in this interval not displayed.    Basic Metabolic Panel: Recent Labs  Lab 06/02/2020 1234 05/17/2020 1240 05/22/2020 1400 06/04/2020 1639 05/17/2020 1918 05/19/2020 2055 05/30/20 0017 05/30/20 0353 05/30/20 0357 05/31/20 0339  NA 140 141   < > 141 134*  --  139  --  142 140  K 6.5* 6.4*   < > 5.8* 4.8  --  3.7  --  3.8 2.9*  CL 107 111  --   --  104  --  107  --   --  102  CO2 18*  --   --   --  17*  --  20*  --   --  29  GLUCOSE 75 74  --   --  140* 145* 180*  --   --  171*  BUN 22* 27*  --   --  29*  --  29*  --   --  22*  CREATININE 2.26* 2.10*  --   --  1.81*  --  1.76*  --   --  1.39*  CALCIUM 8.5*  --   --   --  7.4*  --  7.4*  --   --  8.3*  MG  --   --   --   --   --   --   --  1.7  --  2.1  PHOS  --   --   --   --   --   --   --  3.6  --  2.5   < > = values in this interval not displayed.   GFR: Estimated Creatinine Clearance: 75 mL/min (A) (by C-G formula based on SCr of 1.39 mg/dL (H)). Recent Labs  Lab 06/06/2020 1326 06/05/2020 1545 05/28/2020 1918 05/30/20 0017 05/30/20 0353 05/31/20 0339  WBC  --   --  10.2 10.0 10.6* 15.0*  LATICACIDVEN 7.5* 3.3*  --   --   --   --     Liver Function Tests: Recent Labs  Lab 06/02/2020 1234 05/21/2020 2055  AST 178* 502*  ALT 109* 297*  ALKPHOS 144* 150*  BILITOT 0.9 2.0*  PROT 7.3 7.5  ALBUMIN 3.4* 3.2*   No results for input(s): LIPASE, AMYLASE in the last 168 hours. Recent Labs  Lab 05/30/20 0840  AMMONIA 123*    ABG    Component Value Date/Time   PHART 7.445 05/30/2020 0357   PCO2ART 31.5 (L) 05/30/2020 0357   PO2ART 102 05/30/2020 0357   HCO3 21.9 05/30/2020 0357   TCO2 23 05/30/2020 0357   ACIDBASEDEF 2.0 05/30/2020 0357   O2SAT 98.0 05/30/2020 0357     Coagulation Profile: Recent Labs  Lab 05/26/2020 1234 05/27/2020 1918 05/30/20 0017   INR 1.5* 1.5* 1.5*    Cardiac Enzymes: Recent Labs  Lab 06/02/2020 1234 05/15/2020 1918 05/30/20 0353  CKTOTAL 52 236 352    HbA1C: Hgb A1c MFr Bld  Date/Time Value Ref Range Status  05/22/2020 07:18 PM 5.8 (H) 4.8 - 5.6 % Final    Comment:    (NOTE) Pre diabetes:          5.7%-6.4%  Diabetes:              >6.4%  Glycemic control for   <7.0% adults with diabetes   05/26/2019 07:21 AM 5.9 (H) 4.8 - 5.6 % Final    Comment:    (NOTE) Pre diabetes:          5.7%-6.4% Diabetes:              >6.4% Glycemic control for   <7.0% adults with diabetes     CBG: Recent Labs  Lab 05/30/20 1601 05/30/20 1916 05/30/20 2344 05/31/20 0310 05/31/20 0728  GLUCAP 147* 150* 177* 171* 132*     Jori Moll, Medical Student 05/31/2020 , 12:07 PM

## 2020-05-31 NOTE — Progress Notes (Signed)
Brief Nutrition Note  Chart reviewed. Per CCM note, no plan to escalate care at this time. Waiting on pt's daughter to arrive tomorrow with likely transition to comfort measures in next 1-2 days.  No nutrition interventions warranted at this time. Please consult if goals change and need for nutrition support arises.   Mertie Clause, MS, RD, LDN Inpatient Clinical Dietitian Please see AMiON for contact information.

## 2020-05-31 NOTE — Progress Notes (Signed)
[facility] ADULT ICU REPLACEMENT PROTOCOL   The patient does apply for the [facility] Adult ICU Electrolyte Replacment Protocol based on the criteria listed below:   1. Is GFR >/= 30 ml/min? Yes.    Patient's GFR today is >60 2. Is SCr </= 2? Yes.   Patient's SCr is 1.39 ml/kg/hr 3. Did SCr increase >/= 0.5 in 24 hours? No. 4. Abnormal electrolyte(s): k 2.9 5. Ordered repletion with: protocol 6. If a panic level lab has been reported, has the CCM MD in charge been notified? No..   Physician:    Markus Daft A 05/31/2020 6:27 AM

## 2020-05-31 NOTE — Telephone Encounter (Signed)
Will forward to provider to return call to pt mother

## 2020-05-31 NOTE — Plan of Care (Signed)
PCCM Interval Note  Spoke with patient's mother and sister in the ICU to review his status, clinical condition, prognosis.  Explained that he has sustained significant endorgan injury including brain injury due to his cardiac arrest and prolonged downtime subsequent prolonged CPR time.  They voiced understanding.  His neurological exam is consistent with a severe brain injury as is his EEG.  Unfortunately his prognosis for meaningful neurological recovery is very poor.  They believe that the patient would not want prolonged ICU support, mechanical ventilation without any reasonable chance for meaningful recovery.  Based on this I suspect that we will be moving towards a transition to comfort.  He has 2 daughters, 1 of whom lives in [place] who is going to travel to [place], arrive 3/25 we will review his status and discuss plans for his care when she arrives.  In the meantime I recommended that we do not escalate his care, that he be DNR but continue with current level of support.  His mother and sister both agree.  Had a separate family meeting with the patient's father from whom his mother and other family are estranged.  I updated him similarly, explained patient's clinical condition and prognosis.  He echoed the patient's mom with regard to wishes for [facility] care.  Explained to him that I will work to make sure that all involved family have a chance to be notified and also visit if possible.  My goal would be to transition to comfort at some point over the next 1 to 2 days.  We will continue his current level of support.  Independent critical care time 45 minutes  Levy Pupa, MD, PhD 05/31/2020, 11:44 AM [facility] [phone] or if no answer before 7:00PM call [phone] For any issues after 7:00PM please call eLink [phone]

## 2020-05-31 NOTE — Progress Notes (Signed)
LTM maintenance completed; no skin breakdown under Fp1 or Fp2. 

## 2020-06-01 LAB — BASIC METABOLIC PANEL
Anion gap: 9 (ref 5–15)
BUN: 25 mg/dL — ABNORMAL HIGH (ref 6–20)
CO2: 28 mmol/L (ref 22–32)
Calcium: 8.5 mg/dL — ABNORMAL LOW (ref 8.9–10.3)
Chloride: 106 mmol/L (ref 98–111)
Creatinine, Ser: 1.36 mg/dL — ABNORMAL HIGH (ref 0.61–1.24)
GFR, Estimated: >60 mL/min (ref >60)
Glucose, Bld: 93 mg/dL (ref 70–99)
Potassium: 3.5 mmol/L (ref 3.5–5.1)
Sodium: 143 mmol/L (ref 135–145)

## 2020-06-01 LAB — GLUCOSE, CAPILLARY
Glucose-Capillary: 102 mg/dL — ABNORMAL HIGH (ref 70–99)
Glucose-Capillary: 102 mg/dL — ABNORMAL HIGH (ref 70–99)
Glucose-Capillary: 82 mg/dL (ref 70–99)
Glucose-Capillary: 75 mg/dL (ref 70–99)
Glucose-Capillary: 78 mg/dL (ref 70–99)

## 2020-06-01 LAB — TRIGLYCERIDES: Triglycerides: 303 mg/dL — ABNORMAL HIGH (ref <150)

## 2020-06-01 MED ORDER — ACETAMINOPHEN 325 MG PO TABS: 650.0000 mg | ORAL_TABLET | Freq: Four times a day (QID) | ORAL | Status: DC | PRN

## 2020-06-01 MED ORDER — POLYVINYL ALCOHOL 1.4 % OP SOLN
1.0000 [drp] | Freq: Four times a day (QID) | OPHTHALMIC | Status: DC | PRN
  Filled 2020-06-01: qty 15

## 2020-06-01 MED ORDER — LEVETIRACETAM IN NACL 1000 MG/100ML IV SOLN
1000.0000 mg | Freq: Two times a day (BID) | INTRAVENOUS | Status: DC
  Administered 2020-06-01 (×2): 1000 mg via INTRAVENOUS
  Filled 2020-06-01 (×2): qty 100

## 2020-06-01 MED ORDER — ACETAMINOPHEN 650 MG RE SUPP: 650.0000 mg | Freq: Four times a day (QID) | RECTAL | Status: DC | PRN

## 2020-06-01 MED ORDER — GLYCOPYRROLATE 1 MG PO TABS: 1.0000 mg | ORAL_TABLET | ORAL | Status: DC | PRN

## 2020-06-01 MED ORDER — GLYCOPYRROLATE 0.2 MG/ML IJ SOLN: 0.2000 mg | INTRAMUSCULAR | Status: DC | PRN

## 2020-06-01 MED ORDER — DIPHENHYDRAMINE HCL 50 MG/ML IJ SOLN: 25.0000 mg | INTRAMUSCULAR | Status: DC | PRN

## 2020-06-02 ENCOUNTER — Telehealth: Payer: Self-pay | Admitting: Internal Medicine

## 2020-06-02 LAB — TYPE AND SCREEN
ABO/RH(D): O POS
Antibody Screen: NEGATIVE
Unit division: 0
Unit division: 0

## 2020-06-02 LAB — BPAM RBC
Blood Product Expiration Date: 202204212359
Blood Product Expiration Date: 202204212359
ISSUE DATE / TIME: 202203211835
ISSUE DATE / TIME: 202203221342
Unit Type and Rh: 5100
Unit Type and Rh: 5100

## 2020-06-02 NOTE — Telephone Encounter (Signed)
PC placed to [place], pt's mother. I spoke with her and gave my condolences and fond memories of pt's outgoing spirit whenever he was seen in the office.  Pt passed in presence of family.  Pt's remains will be cremated early next wk.  She thank me for the call and ask that I thank my staff also.

## 2020-06-08 NOTE — Progress Notes (Signed)
Patient terminally extubated per MD order. RN at bedside.

## 2020-06-08 NOTE — Plan of Care (Signed)
°  Problem: Clinical Measurements: °Goal: Ability to maintain clinical measurements within normal limits will improve °Outcome: Progressing °Goal: Will remain free from infection °Outcome: Progressing °  °Problem: Coping: °Goal: Level of anxiety will decrease °Outcome: Progressing °  °

## 2020-06-08 NOTE — Progress Notes (Signed)
Patient transitioned to comfort care per family wishes. Mother, daughters and other family members along with hospital Chaplin present at bedside at patient's time of death. MD notified.

## 2020-06-08 NOTE — Progress Notes (Incomplete)
Patient transitioned to comfort care per family wishes. Mother, daughters and other family members present at bedside at time of

## 2020-06-08 NOTE — Progress Notes (Signed)
eLink Physician-Brief Progress Note Patient Name: Thomas Atkins DOB: [birth year] MRN: [id number]   Date of Service  05/21/2020  HPI/Events of Note  Patient with myoclonic jerks not suppressed by Benzodiazepines.  eICU Interventions  Keppra ordered.        [name] 05/18/2020, 2:07 AM

## 2020-06-08 NOTE — Progress Notes (Addendum)
NAME:  TIARA BARTOLI, MRN:  [id number], DOB:  06-22-1975, LOS: 3 ADMISSION DATE:  2020-06-06, CONSULTATION DATE:  06-06-2020 REFERRING MD:  Dr. Rodena Medin, ER, CHIEF COMPLAINT:  Cardiac arrest   History of Present Illness:  45 yo male smoker found by roommate unresponsive in his bedroom  Last seen normal at 3 am on 06-Jun-2020.  EMS noted pt had white bag of powder next to him in bed.  He was found to be in asystole.  He had CPR for approximately 25 minutes before ROSC.  He has hx of ETOH with cirrhosis and varices, and HIV.  Hx from family, medical team and chart review  Pertinent  Medical History  ETOH with cirrhosis and varices, HIV, Depression, Anxiety, Substance abuse, Pectus deformity  Significant Hospital Events: Including procedures, antibiotic start and stop dates in addition to other pertinent events   . Admit, TTM  Interim History / Subjective:   Pt remains unresponsive to painful stimuli this morning. Increased oxygen demand overnight to 80% FiO2 with Peep of 8.   Objective   Blood pressure (!) 89/61, pulse 82, temperature (!) 97.3 F (36.3 C), temperature source Bladder, resp. rate (!) 28, height 6\' 1"  (1.854 m), weight 90 kg, SpO2 96 %.    Vent Mode: PRVC FiO2 (%):  [40 %-80 %] 80 % Set Rate:  [24 bmp-28 bmp] 28 bmp Vt Set:  [620 mL] 620 mL PEEP:  [5 cmH20-8 cmH20] 5 cmH20 Plateau Pressure:  [27 cmH20-30 cmH20] 27 cmH20   Intake/Output Summary (Last 24 hours) at 05/31/2020 0724 Last data filed at 05/30/2020 0700 Gross per 24 hour  Intake 2325.83 ml  Output 1280 ml  Net 1045.83 ml   Filed Weights   June 06, 2020 1313  Weight: 90 kg    Examination: General: Acutely ill appearing middle-age male, sedated on propofol, mechanically ventilated HEENT: ETT and gastric tube in place, scant, clear, thick drainage from eyes bilaterally, chemosis bilaterally Neuro: Unresponsive to noxious stimuli, dysconjugate horizontal gaze, negative doll's eye, fixed 1-2 mm pupils, myoclonic  jerking with dorsiflexion of foot, no spontaneous myoclonic jerk noted CV: RRR, normal S1S2, no MRG PULM: course inspiratory and expiratory rhonchi bilaterally, mechanically ventilated GI: soft, no bowel sounds, cooling pads in place GU: Foley in place Extremities: warm, 1+ dorsalis pedis and radial pulses bilaterally Skin: no rashes or lesions  Labs/imaging that I havepersonally reviewed    CMP Latest Ref Rng & Units 05/27/2020 05/31/2020 05/31/2020  Glucose 70 - 99 mg/dL 93 98 06/02/2020)  BUN 6 - 20 mg/dL 818(E) 99(B) 71(I)  Creatinine 0.61 - 1.24 mg/dL 96(V) 8.93(Y) 1.01(B)  Sodium 135 - 145 mmol/L 143 141 140  Potassium 3.5 - 5.1 mmol/L 3.5 3.5 2.9(L)  Chloride 98 - 111 mmol/L 106 106 102  CO2 22 - 32 mmol/L 28 28 29   Calcium 8.9 - 10.3 mg/dL 5.10(C) 8.3(L) 8.3(L)  Total Protein 6.5 - 8.1 g/dL - - -  Total Bilirubin 0.3 - 1.2 mg/dL - - -  Alkaline Phos 38 - 126 U/L - - -  AST 15 - 41 U/L - - -  ALT 0 - 44 U/L - - -    CBC Latest Ref Rng & Units 05/31/2020 05/30/2020 05/30/2020  WBC 4.0 - 10.5 K/uL 15.0(H) - 10.6(H)  Hemoglobin 13.0 - 17.0 g/dL 10.6(L) 11.9(L) 10.7(L)  Hematocrit 39.0 - 52.0 % 33.1(L) 35.0(L) 32.5(L)  Platelets 150 - 400 K/uL 141(L) - 135(L)    ABG    Component Value Date/Time  PHART 7.445 05/30/2020 0357   PCO2ART 31.5 (L) 05/30/2020 0357   PO2ART 102 05/30/2020 0357   HCO3 21.9 05/30/2020 0357   TCO2 23 05/30/2020 0357   ACIDBASEDEF 2.0 05/30/2020 0357   O2SAT 98.0 05/30/2020 0357    Resolved Hospital Problem list   Metabolic Acidosis  Assessment & Plan:  KOAH CHISENHALL is a 45 yo male who presented after cardiac arrest at home with unknown downtime who was found to have a severe anoxic brian injury and multiorgan system failure. Planning to transition to comfort care later today after family members arrive.  Asystolic cardiac arrest likely from accidental drug overdose. Cardiogenic Shock - d/c TTM - echo overall normal - continue telemetry  Acute  anoxic encephalopathy after cardiac arrest. Hx of anxiety, depression, ETOH, polysubstance abuse. - remains unresponsive to stimul - propofol gtt and keppra BID for myoclonic jerk suppression - transition to comfort care this evening with family present  Acute hypoxic respiratory failure in setting of cardiac arrest.  Aspiration Pneumonia vs pneumonitis - improving bilateral infiltrates on CXR - PRVC, FiO2 40%, Peep 8 - SpO2 goal > 92% - cont Unasyn, day 4  Alcoholic cirrhosis with varices and ascites. Upper GI Bleed - s/p octreotide - cont protonix - hgb stable - monitor OG output - hold lasix, inderal, aldactone  Multiorgan System Failure 2/2 Cardiac Arrest AKI from ATN 2nd to cardiac arrest. Acute Liver Failure - sCr stable at 1.36 - 2.3 L UOP yesterady - Ammonia 123, INR 1.5, AST 502, ALT 297, T bili 2.0 - d/c labs for tomorrow as he transitions to comfort care  Metabolic acidosis with lactic acidosis, Resolved - Likely secondary to acute kidney failure and tissue hypoxia - bicarb 28  Hypokalemia Hypophosphatemia - Repleted - d/c labs for tomorrow as he transitions to comfort care  HIV - hold outpatient Biktarvy  Best practice (evaluated daily)  Diet:  NPO Pain/Anxiety/Delirium protocol (if indicated): Yes (RASS goal 0) VAP protocol (if indicated): Yes DVT prophylaxis: SCD GI prophylaxis: PPI Glucose control:  SSI Yes Central venous access:  N/A Arterial line:  N/A Foley:  Yes, and it is still needed Mobility:  bed rest  PT consulted: N/A Last date of multidisciplinary goals of care discussion [2020-06-15] Code Status:  DNR Disposition: ICU  Labs   CBC: Recent Labs  Lab 15-Jun-2020 1234 06/15/20 1240 2020/06/15 1918 05/30/20 0017 05/30/20 0353 05/30/20 0357 05/31/20 0339  WBC 17.7*  --  10.2 10.0 10.6*  --  15.0*  NEUTROABS 14.1*  --  9.4* 8.4* 9.1*  --  12.1*  HGB 8.1*   < > 11.6* 10.9* 10.7* 11.9* 10.6*  HCT 29.3*   < > 38.0* 35.3* 32.5* 35.0*  33.1*  MCV 88.3  --  81.2 79.9* 78.1*  --  77.3*  PLT 241  --  187 142* 135*  --  141*   < > = values in this interval not displayed.    Basic Metabolic Panel: Recent Labs  Lab 06/15/20 1918 06-15-2020 2055 05/30/20 0017 05/30/20 0353 05/30/20 0357 05/31/20 0339 05/31/20 2011 06/04/2020 0443  NA 134*  --  139  --  142 140 141 143  K 4.8  --  3.7  --  3.8 2.9* 3.5 3.5  CL 104  --  107  --   --  102 106 106  CO2 17*  --  20*  --   --  29 28 28   GLUCOSE 140* 145* 180*  --   --  171* 98 93  BUN 29*  --  29*  --   --  22* 25* 25*  CREATININE 1.81*  --  1.76*  --   --  1.39* 1.34* 1.36*  CALCIUM 7.4*  --  7.4*  --   --  8.3* 8.3* 8.5*  MG  --   --   --  1.7  --  2.1  --   --   PHOS  --   --   --  3.6  --  2.5  --   --    GFR: Estimated Creatinine Clearance: 76.7 mL/min (A) (by C-G formula based on SCr of 1.36 mg/dL (H)). Recent Labs  Lab 05/31/2020 1326 05/31/2020 1545 05/23/2020 1918 05/30/20 0017 05/30/20 0353 05/31/20 0339  WBC  --   --  10.2 10.0 10.6* 15.0*  LATICACIDVEN 7.5* 3.3*  --   --   --   --     Liver Function Tests: Recent Labs  Lab 06/05/2020 1234 05/31/2020 2055  AST 178* 502*  ALT 109* 297*  ALKPHOS 144* 150*  BILITOT 0.9 2.0*  PROT 7.3 7.5  ALBUMIN 3.4* 3.2*   No results for input(s): LIPASE, AMYLASE in the last 168 hours. Recent Labs  Lab 05/30/20 0840  AMMONIA 123*    ABG    Component Value Date/Time   PHART 7.445 05/30/2020 0357   PCO2ART 31.5 (L) 05/30/2020 0357   PO2ART 102 05/30/2020 0357   HCO3 21.9 05/30/2020 0357   TCO2 23 05/30/2020 0357   ACIDBASEDEF 2.0 05/30/2020 0357   O2SAT 98.0 05/30/2020 0357     Coagulation Profile: Recent Labs  Lab 05/16/2020 1234 05/30/2020 1918 05/30/20 0017  INR 1.5* 1.5* 1.5*    Cardiac Enzymes: Recent Labs  Lab 05/10/2020 1234 06/05/2020 1918 05/30/20 0353  CKTOTAL 52 236 352    HbA1C: Hgb A1c MFr Bld  Date/Time Value Ref Range Status  05/25/2020 07:18 PM 5.8 (H) 4.8 - 5.6 % Final    Comment:     (NOTE) Pre diabetes:          5.7%-6.4%  Diabetes:              >6.4%  Glycemic control for   <7.0% adults with diabetes   05/26/2019 07:21 AM 5.9 (H) 4.8 - 5.6 % Final    Comment:    (NOTE) Pre diabetes:          5.7%-6.4% Diabetes:              >6.4% Glycemic control for   <7.0% adults with diabetes     CBG: Recent Labs  Lab 05/31/20 1522 05/31/20 1927 05/31/20 2332 06-Jun-2020 0144 06/06/2020 0311  GLUCAP 103* 93 81 102* 102*     Jori Moll, Medical Student 06/06/20 , 7:24 AM

## 2020-06-08 NOTE — Progress Notes (Signed)
   05/17/2020 1815  Clinical Encounter Type  Visited With Patient and family together  Visit Type Death  Referral From Nurse  Consult/Referral To Chaplain  Spiritual Encounters  Spiritual Needs Grief support;Emotional;Prayer  Stress Factors  Family Stress Factors Loss;Major life changes  Chaplain received a page from Nurse [place] on [place].  She informed Thomas Atkins is on comfort care and family decided to withdraw life support.  Thomas Atkins was surrounded by loved ones and chaplain offered prayer, support and comfort.  Thomas Atkins, his mother, and the family were very thankful and thanked the staff as well. [employer].   Chaplain Briannah Lona Morgan-Simpson [phone]

## 2020-06-08 NOTE — Telephone Encounter (Signed)
Phone call returned to patient's mother Marylu Lund.  She states that she wanted to inform me that the patient was admitted a few days ago and is in very bad shape in the ICU.  I looked over his chart.  He was found down at home in cardiac arrest.  She has made the decision for comfort care.  She is waiting on one of his daughters to come in from out of state at which time she thinks they will discontinue life support.  She stated that she just wanted to let me know and thanked me for the care that I have provided to him.  She states that the patient always spoke fondly of me and was always appreciative of the care that I provided.  I told her that I am very sorry and saddened by what has happened to him.  However I do think that he would not want to be on life support for prolonged period if there is little chance for meaningful recovery.

## 2020-06-08 DEATH — deceased

## 2020-06-09 ENCOUNTER — Other Ambulatory Visit: Payer: Self-pay

## 2020-06-19 DIAGNOSIS — N179 Acute kidney failure, unspecified: Secondary | ICD-10-CM | POA: Diagnosis present

## 2020-06-19 DIAGNOSIS — F1910 Other psychoactive substance abuse, uncomplicated: Secondary | ICD-10-CM | POA: Diagnosis present

## 2020-06-19 DIAGNOSIS — G253 Myoclonus: Secondary | ICD-10-CM | POA: Diagnosis present

## 2020-06-19 DIAGNOSIS — G931 Anoxic brain damage, not elsewhere classified: Secondary | ICD-10-CM | POA: Diagnosis present

## 2020-07-08 NOTE — Death Summary Note (Signed)
DEATH SUMMARY   Patient Details  Name: Thomas Atkins MRN: [id number] DOB: 1975-05-24  Admission/Discharge Information   Admit Date:  2020-06-23  Date of Death: Date of Death: 06/04/2020  Time of Death: Time of Death: 1841  Length of Stay: 3  Referring Physician: Marcine MatarJohnson, Deborah B, MD   Reason(s) for Hospitalization  Cardiac arrest  Diagnoses  Preliminary cause of death:   Anoxic encephalopathy  Secondary Diagnoses (including complications and co-morbidities):  Principal Problem:   Anoxic encephalopathy (HCC) Active Problems:   Alcohol abuse   Major depressive disorder, recurrent severe without psychotic features (HCC)   Nicotine dependence, cigarettes, uncomplicated   HIV (human immunodeficiency virus infection) (HCC)   Alcoholic cirrhosis of liver with ascites (HCC)   Respiratory failure (HCC)   High anion gap metabolic acidosis   Cardiac arrest (HCC)   Myoclonus   Acute renal failure (ARF) (HCC)   Polysubstance abuse (HCC) Cardiogenic shock Possible septic shock Aspiration pneumonitis/pneumonia Anemia of critical illness and chronic disease Upper GI bleeding, presumed due to known history of esophageal varices  Brief Hospital Course (including significant findings, care, treatment, and services provided and events leading to death)  Thomas Atkins is a 45 y.o. year old male with a history of alcohol abuse and cirrhosis, polysubstance use, HIV, major depression and anxiety.  He was found unresponsive in his bedroom on 2020-06-23, previously seen responding at 3 AM.  There was white powder beside him.  He was found to be in asystole and underwent CPR for approximately 25 minutes before ROSC.  He underwent targeted temperature management 37 degrees following stabilization with mechanical ventilation, pressors for cardiogenic and also possible septic shock.  Course complicated by acute renal failure, severe anion gap metabolic acidosis, lactic acidosis, associated hyperkalemia.   He had bilateral infiltrates on chest x-ray it was felt that this may be either an aspiration pneumonitis or pneumonia.  He was treated empirically with antibiotics for possible aspiration pneumonia.  He developed severe myoclonus and required Keppra, propofol in order to manage his tremors.  CT head done right at presentation was unremarkable.  He remained completely unresponsive even when sedation was lifted.  Exam was consistent with a severe anoxic brain injury.  He was treated empirically with octreotide and Protonix for evidence of probable GI blood loss.  His chronic Lasix, Inderal, Aldactone and Biktarvy were all held.  Unfortunate spite all interventions he did not improve his mental status, had clinical exam consistent with a profound anoxic injury.  Based on this discussions were undertaken with the patient's family regarding his poor prognosis.  Decision was made to transition to comfort care.  This was done 06/07/2020.  Patient expired later the same day.    Pertinent Labs and Studies  Significant Diagnostic Studies CT Head Wo Contrast  Result Date: 2020-06-23 CLINICAL DATA:  Altered mental status.  Cardiac arrest. EXAM: CT HEAD WITHOUT CONTRAST TECHNIQUE: Contiguous axial images were obtained from the base of the skull through the vertex without intravenous contrast. COMPARISON:  February 07, 2012. FINDINGS: Brain: No evidence of acute infarction, hemorrhage, hydrocephalus, extra-axial collection or mass lesion/mass effect. Vascular: No hyperdense vessel or unexpected calcification. Skull: Normal. Negative for fracture or focal lesion. Sinuses/Orbits: No acute finding. Other: None. IMPRESSION: Normal head CT. Electronically Signed   By: Lupita RaiderJames  Green Jr M.D.   On: 02022-04-16 13:56   DG Chest Port 1 View  Result Date: 05/31/2020 CLINICAL DATA:  Abnormal respiration EXAM: PORTABLE CHEST 1 VIEW COMPARISON:  Radiograph 05/30/2020  FINDINGS: Stable satisfactory positioning of an endotracheal tube,  transesophageal tube and left IJ approach central venous catheter. Telemetry leads and external support devices remain over the chest. Persistent bilateral heterogeneous opacities are present in the lungs with some slight interval clearing in the mid to lower lung fields. Stable cardiomediastinal contours accounting for differences in inflation in technique. No pneumothorax. Suspect trace residual effusions. No acute osseous or soft tissue abnormality. IMPRESSION: 1. Persistent bilateral heterogeneous opacities in the lungs with some slight interval clearing in the mid to lower lungs. 2. Stable support devices. Electronically Signed   By: Kreg Shropshire M.D.   On: 05/31/2020 04:15   DG Chest Port 1 View  Result Date: 05/30/2020 CLINICAL DATA:  Respiratory failure EXAM: PORTABLE CHEST 1 VIEW COMPARISON:  Radiograph 05/14/2020 FINDINGS: Endotracheal tube tip terminates 4.2 cm in the carina. Transesophageal tube tip terminates below the margins of imaging. Left IJ catheter terminates near the left brachiocephalic-caval confluence. Additional external support devices and telemetry leads overlie the chest. Redemonstration of the bilateral heterogeneous opacities with a mid to lower lung distribution. Overall increasing coalescence of opacity throughout both lungs. No pneumothorax. Questionable trace effusions. Cardiomediastinal contours are partially obscured by opacity but visible contour appear grossly similar. No acute osseous or soft tissue abnormality. IMPRESSION: 1. Increasing coalescence of opacity throughout both lungs. Appearance could reflect worsening edema, pneumonia or developing ARDS. 2. Support devices as above. Electronically Signed   By: Kreg Shropshire M.D.   On: 05/30/2020 05:21   DG Chest Port 1 View  Result Date: 05/26/2020 CLINICAL DATA:  Central line placement EXAM: PORTABLE CHEST 1 VIEW COMPARISON:  Earlier today at 12:38 p.m. FINDINGS: 3:13 p.m. Endotracheal tube terminates 6.3 cm above  carina. Left internal jugular line tip at mid SVC. Nasogastric tube extends beyond the inferior aspect of the film. External pacer/defibrillator. Cardiomegaly accentuated by AP portable technique. No pleural effusion or pneumothorax. Right greater than left interstitial and airspace disease, increased. Relative sparing of the left upper lung. IMPRESSION: Interval left internal jugular line placement with tip at mid SVC. Improved right lower lobe consolidation, which was likely due to atelectasis. Progression of diffuse interstitial and airspace disease, favoring asymmetric pulmonary edema. Electronically Signed   By: Jeronimo Greaves M.D.   On: 05/16/2020 15:55   DG Chest Port 1 View  Result Date: 05/28/2020 CLINICAL DATA:  Recent cardiac arrest, status post intubation EXAM: PORTABLE CHEST 1 VIEW COMPARISON:  03/30/2020 FINDINGS: Cardiac shadow is stable. Endotracheal tube and gastric catheter are noted in satisfactory position. Right basilar consolidation is noted medially with small right pleural effusion. No definitive rib abnormality is noted. Mild vascular congestion is seen as well. IMPRESSION: Mild vascular congestion. Right lower lobe consolidation with associated effusion. Tubes and lines as described in satisfactory position. Electronically Signed   By: Alcide Clever M.D.   On: 05/26/2020 13:02   EEG adult  Result Date: 05/31/2020 Lind Guest, MD     05/31/2020  7:38 AM EEG Procedure CPT/Type of Study: 95720; 24hr EEG with video Referring Provider: Craige Cotta Primary Neurological Diagnosis: cardiac arrest History: This is a 45 yr old patient, undergoing an EEG to evaluate for cardiac arrest, myoclonus. Clinical State: obtunded Technical Description: The EEG was performed using standard setting per the guidelines of American Clinical Neurophysiology Society (ACNS). A minimum of 21 electrodes were placed on scalp according to the International 10-20 or/and 10-10 Systems. Supplemental electrodes were placed as  needed. Single EKG electrode was also used to detect cardiac  arrhythmia. Patient's behavior was continuously recorded on video simultaneously with EEG. A minimum of 16 channels were used for data display. Each epoch of study was reviewed manually daily and as needed using standard referential and bipolar montages. Computerized quantitative EEG analysis (such as compressed spectral array analysis, trending, automated spike & seizure detection) were used as indicated. Day 2: from 0730 05/30/20 to 0730 05/31/20 EEG Description: Overall Amplitude: suppressed Predominant Frequency: The background activity showed a burst-suppression pattern initially, later near complete background suppression. Superimposed Frequencies: none The background was symmetric Background Abnormalities: Other; burst-suppression pattern as above Rhythmic or periodic pattern: No Epileptiform activity: no Electrographic seizures: no Events: Yes; some jerking movements of head and torso without EEG correlate Breach rhythm: no Reactivity: absent Stimulation procedures: Hyperventilation: not done Photic stimulation: not done Sleep Background: none EKG:no significant arrhythmia Impression: This was a markedly abnormal continuous video EEG due to near complete background suppression and anoxic myoclonic jerking, consistent with a severe anoxic injury pattern.   Overnight EEG with video  Result Date: 05/30/2020 Lind Guest, MD     05/30/2020  7:54 AM EEG Procedure CPT/Type of Study: 95720; 24hr EEG with video Referring Provider: Craige Cotta Primary Neurological Diagnosis: cardiac arrest History: This is a 45 yr old patient, undergoing an EEG to evaluate for cardiac arrest, myoclonus. Clinical State: obtunded Technical Description: The EEG was performed using standard setting per the guidelines of American Clinical Neurophysiology Society (ACNS). A minimum of 21 electrodes were placed on scalp according to the International 10-20 or/and 10-10 Systems.  Supplemental electrodes were placed as needed. Single EKG electrode was also used to detect cardiac arrhythmia. Patient's behavior was continuously recorded on video simultaneously with EEG. A minimum of 16 channels were used for data display. Each epoch of study was reviewed manually daily and as needed using standard referential and bipolar montages. Computerized quantitative EEG analysis (such as compressed spectral array analysis, trending, automated spike & seizure detection) were used as indicated. Day 1: from 1908 05/24/2020 to 0730 05/30/20 EEG Description: Overall Amplitude: suppressed Predominant Frequency: The background activity showed a burst-suppression pattern with low voltage suppression and bursts of irregular 1-4Hz  activity every 2-10 seconds. Superimposed Frequencies: none The background was symmetric Background Abnormalities: Other; burst suppression pattern as above Rhythmic or periodic pattern: No Epileptiform activity: no Electrographic seizures: no Events: Yes; occasional myoclonic jerking of the head occurring with bursts of activity Breach rhythm: no Reactivity: absent Stimulation procedures: Hyperventilation: not done Photic stimulation: no change Sleep Background: none EKG:no significant arrhythmia Impression: This was a markedly abnormal continuous video EEG due to a burst-suppression pattern, absent reactivity, and anoxic myoclonus. This was consistent with a severe anoxic injury pattern.   ECHOCARDIOGRAM COMPLETE  Result Date: 05/30/2020    ECHOCARDIOGRAM REPORT   Patient Name:   Thomas Atkins Date of Exam: 05/30/2020 Medical Rec #:  [id number]    Height:       73.0 in Accession #:    [id number]   Weight:       198.4 lb Date of Birth:  [birth year]    BSA:          2.144 m Patient Age:    44 years     BP:           133/80 mmHg Patient Gender: M            HR:           108 bpm. Exam Location:  Inpatient Procedure: 2D Echo Indications:  Acute Respiratory Distress R06.03  History:         Patient has prior history of Echocardiogram examinations, most                 recent 10/11/2019. Alcohol Abuse.  Sonographer:    Thurman Coyer RDCS (AE) Referring Phys: [id number] PETER E BABCOCK IMPRESSIONS  1. Left ventricular ejection fraction, by estimation, is 50 to 55%. The left ventricle has low normal function. The left ventricle has no regional wall motion abnormalities. The left ventricular internal cavity size was mildly dilated. Left ventricular diastolic parameters were normal.  2. Right ventricular systolic function is normal. The right ventricular size is normal. There is normal pulmonary artery systolic pressure. The estimated right ventricular systolic pressure is 30.9 mmHg.  3. The mitral valve is normal in structure. No evidence of mitral valve regurgitation.  4. The aortic valve is tricuspid. Aortic valve regurgitation is not visualized. No aortic stenosis is present.  5. Aortic dilatation noted. There is mild dilatation of the ascending aorta, measuring 38 mm.  6. The inferior vena cava is normal in size with greater than 50% respiratory variability, suggesting right atrial pressure of 3 mmHg. FINDINGS  Left Ventricle: Left ventricular ejection fraction, by estimation, is 50 to 55%. The left ventricle has low normal function. The left ventricle has no regional wall motion abnormalities. The left ventricular internal cavity size was mildly dilated. There is no left ventricular hypertrophy. Left ventricular diastolic parameters were normal. Right Ventricle: The right ventricular size is normal. No increase in right ventricular wall thickness. Right ventricular systolic function is normal. There is normal pulmonary artery systolic pressure. The tricuspid regurgitant velocity is 2.64 m/s, and  with an assumed right atrial pressure of 3 mmHg, the estimated right ventricular systolic pressure is 30.9 mmHg. Left Atrium: Left atrial size was normal in size. Right Atrium: Right atrial size was normal in  size. Pericardium: There is no evidence of pericardial effusion. Mitral Valve: The mitral valve is normal in structure. No evidence of mitral valve regurgitation. Tricuspid Valve: The tricuspid valve is normal in structure. Tricuspid valve regurgitation is trivial. Aortic Valve: The aortic valve is tricuspid. Aortic valve regurgitation is not visualized. No aortic stenosis is present. Pulmonic Valve: The pulmonic valve was not well visualized. Pulmonic valve regurgitation is not visualized. Aorta: The aortic root is normal in size and structure and aortic dilatation noted. There is mild dilatation of the ascending aorta, measuring 38 mm. Venous: The inferior vena cava is normal in size with greater than 50% respiratory variability, suggesting right atrial pressure of 3 mmHg. IAS/Shunts: The interatrial septum was not well visualized.  LEFT VENTRICLE PLAX 2D LVIDd:         5.80 cm  Diastology LVIDs:         4.40 cm  LV e' medial:    13.90 cm/s LV PW:         0.90 cm  LV E/e' medial:  7.7 LV IVS:        0.90 cm  LV e' lateral:   13.60 cm/s LVOT diam:     2.50 cm  LV E/e' lateral: 7.9 LV SV:         108 LV SV Index:   50 LVOT Area:     4.91 cm  RIGHT VENTRICLE RV S prime:     15.00 cm/s TAPSE (M-mode): 2.0 cm LEFT ATRIUM             Index  RIGHT ATRIUM           Index LA diam:        2.60 cm 1.21 cm/m  RA Area:     19.00 cm LA Vol (A2C):   71.5 ml 33.34 ml/m RA Volume:   50.50 ml  23.55 ml/m LA Vol (A4C):   53.9 ml 25.14 ml/m LA Biplane Vol: 62.9 ml 29.33 ml/m  AORTIC VALVE LVOT Vmax:   104.00 cm/s LVOT Vmean:  70.200 cm/s LVOT VTI:    0.219 m  AORTA Ao Root diam: 3.80 cm MITRAL VALVE                TRICUSPID VALVE MV Area (PHT): 2.73 cm     TR Peak grad:   27.9 mmHg MV Decel Time: 278 msec     TR Vmax:        264.00 cm/s MV E velocity: 107.00 cm/s MV A velocity: 81.40 cm/s   SHUNTS MV E/A ratio:  1.31         Systemic VTI:  0.22 m                             Systemic Diam: 2.50 cm Epifanio Lesches MD  Electronically signed by Epifanio Lesches MD Signature Date/Time: 05/30/2020/5:50:58 PM    Final       Les Pou Madalynn Pickelsimer 06/19/2020, 6:27 PM

## 2020-07-10 ENCOUNTER — Ambulatory Visit: Payer: Self-pay | Admitting: Internal Medicine

## 2020-08-01 IMAGING — US IR PARACENTESIS
1 series · 3 of 3 positions shown · non-contrast
Comparison: none

INDICATION: Patient with history of cirrhosis and large volume ascites.
Interventional radiology asked to perform a diagnostic and
therapeutic paracentesis.

[Series 1: (id) · 3 of 3 slices shown]
[im 1/3]
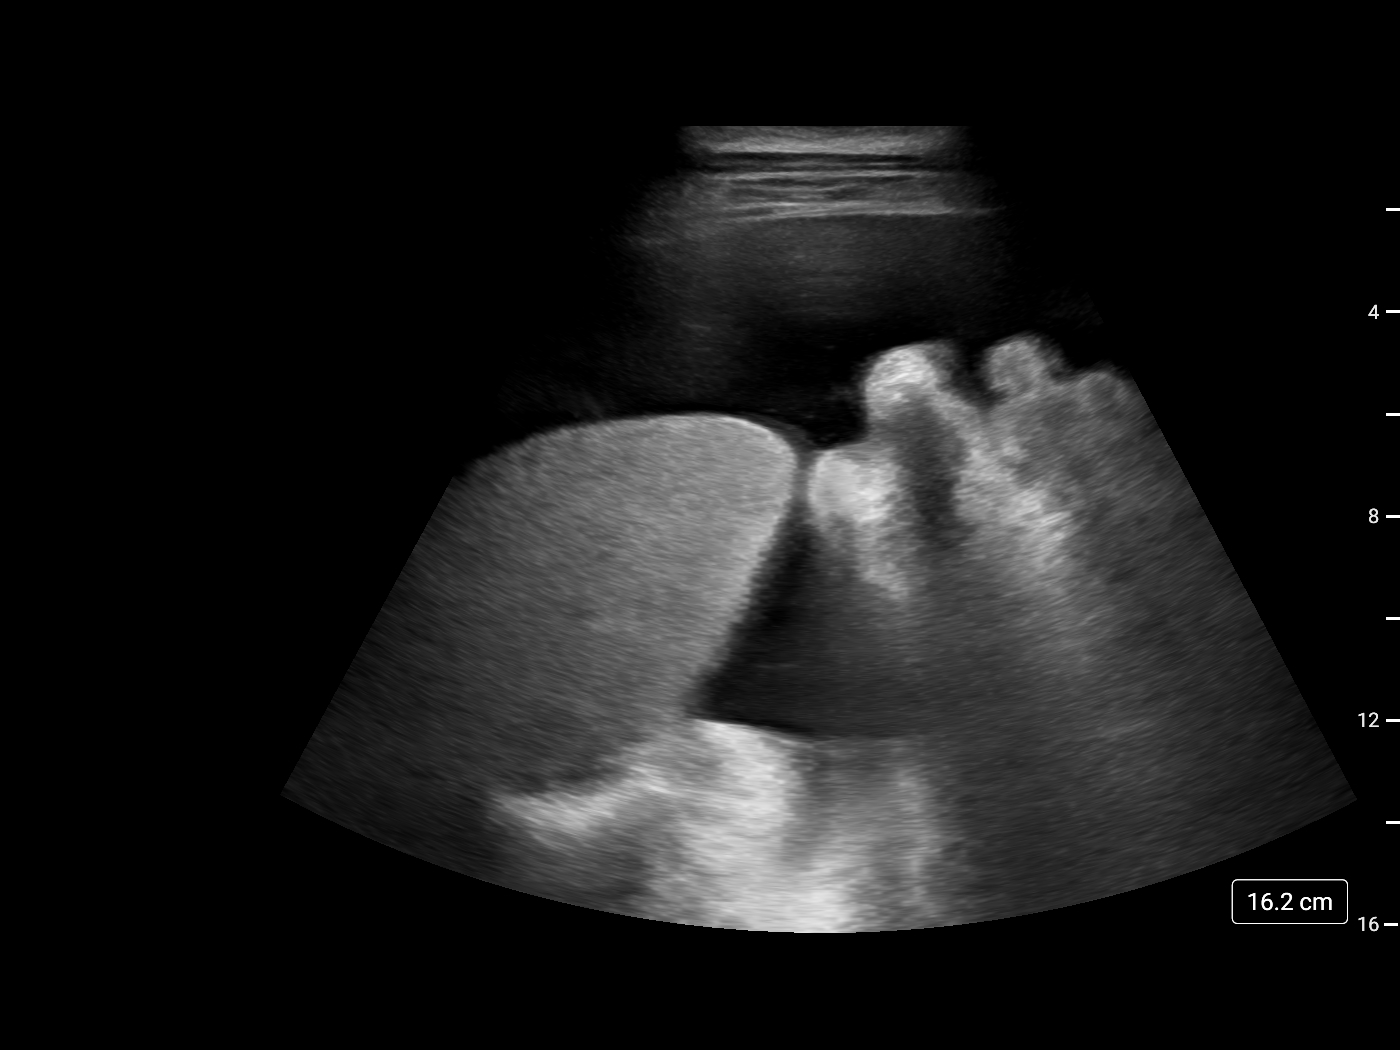
[im 2/3]
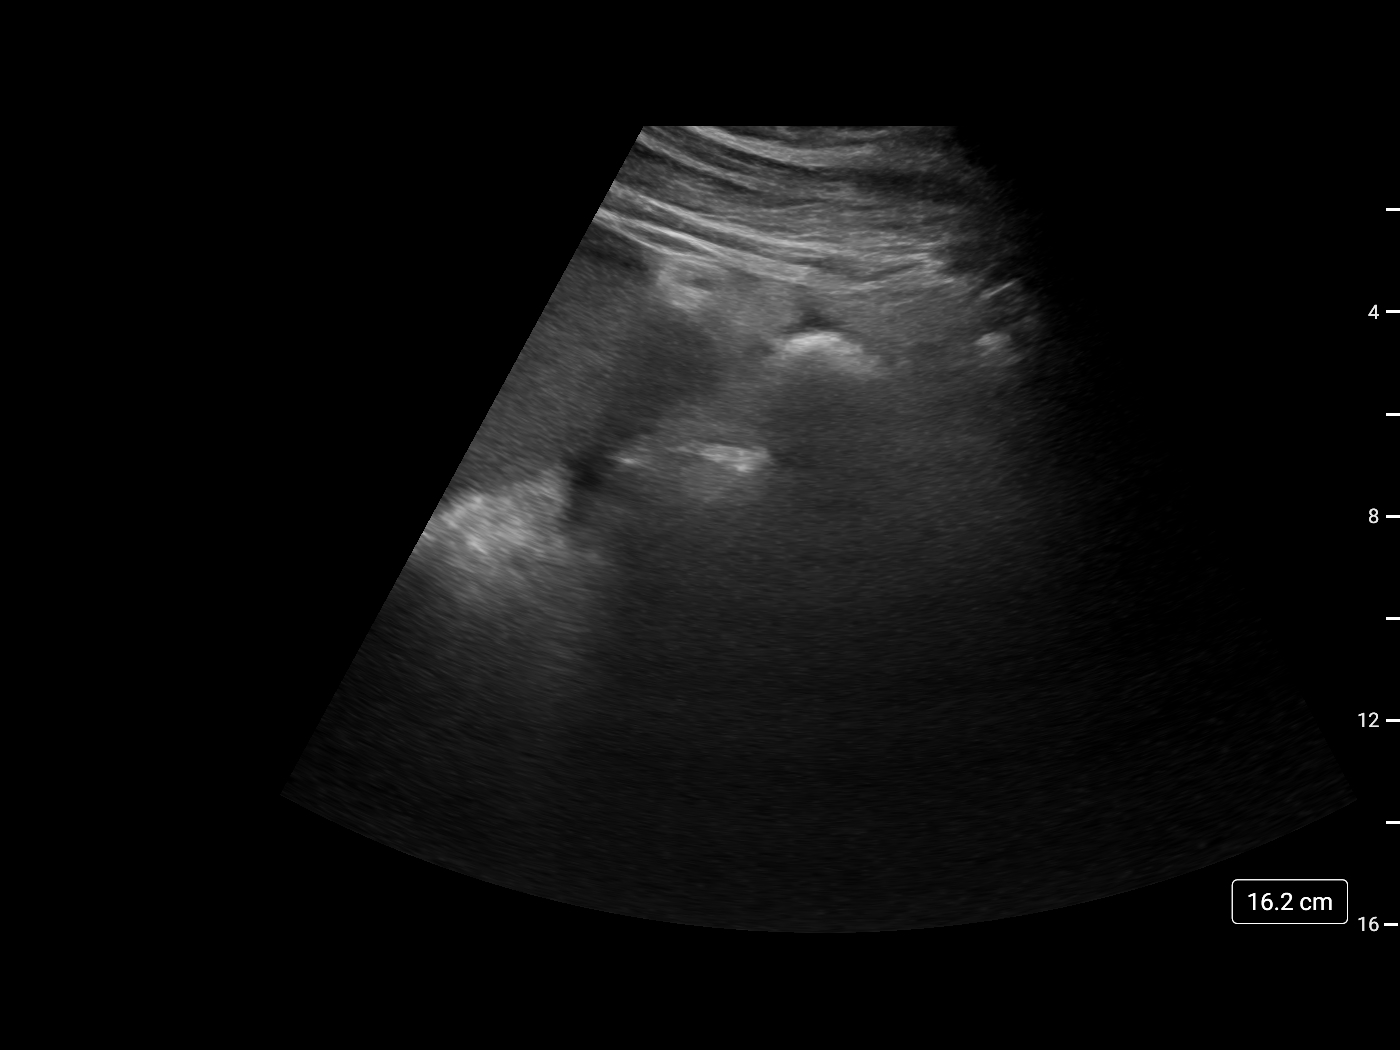
[im 3/3]
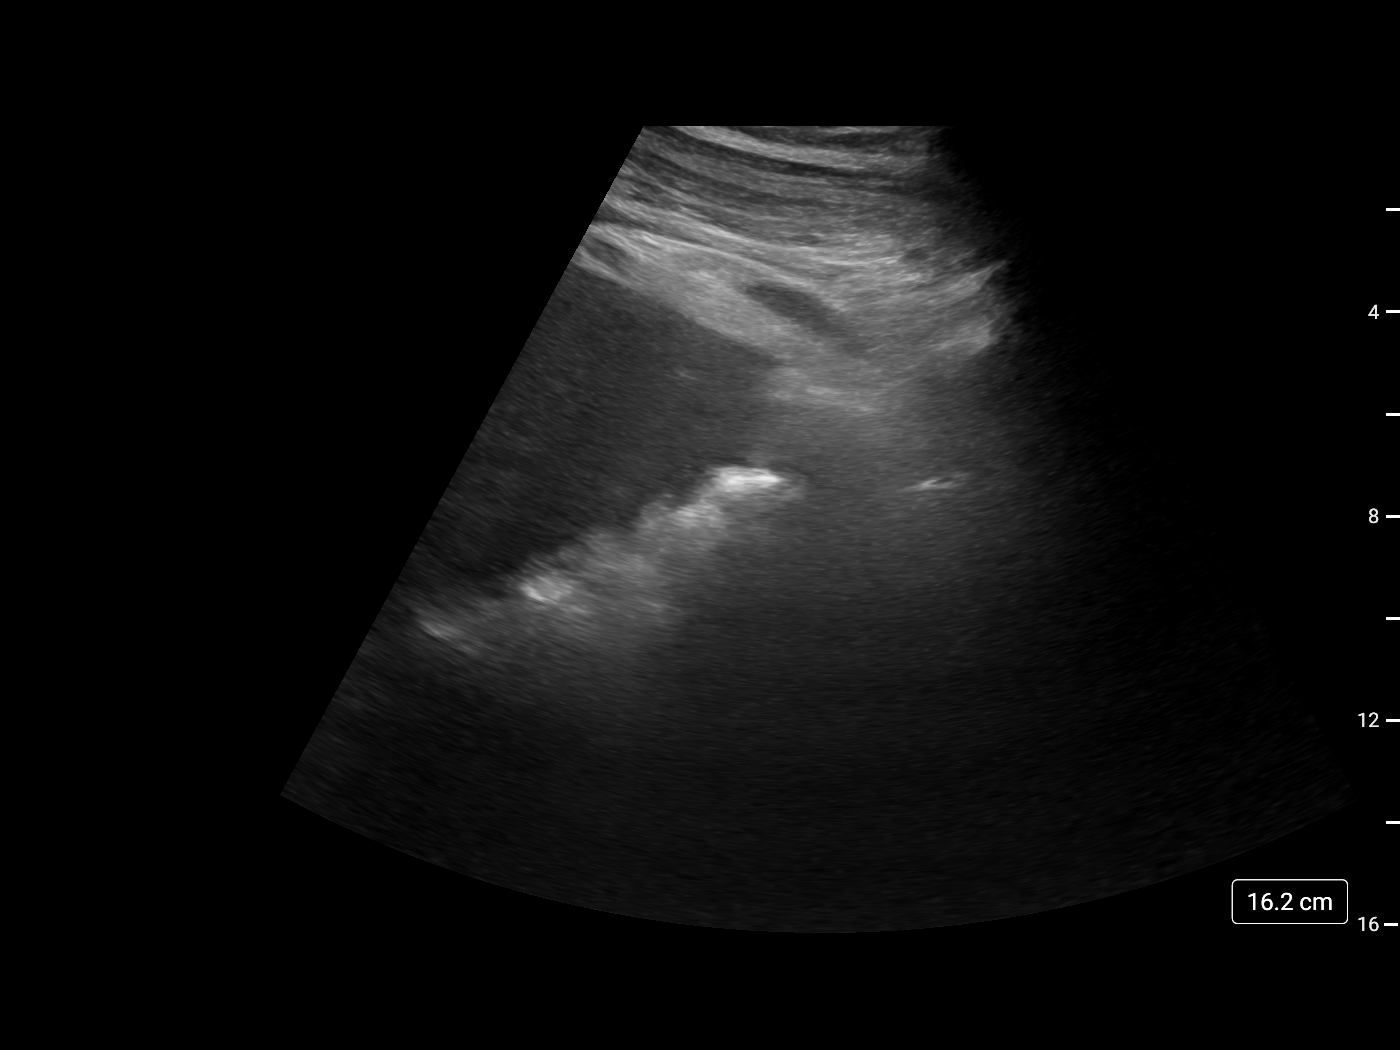

[3 of 3 positions shown; findings below may reference images not displayed]

EXAM:
ULTRASOUND GUIDED right PARACENTESIS

MEDICATIONS:
1% lidocaine 10 mL

COMPLICATIONS:
None immediate.

PROCEDURE:
Informed written consent was obtained from the patient after a
discussion of the risks, benefits and alternatives to treatment. A
timeout was performed prior to the initiation of the procedure.

Initial ultrasound scanning demonstrates a large amount of ascites
within the right lower abdominal quadrant. The right lower abdomen
was prepped and draped in the usual sterile fashion. 1% lidocaine
was used for local anesthesia.

Following this, a 19 gauge, 7-cm, Yueh catheter was introduced. An
ultrasound image was saved for documentation purposes. The
paracentesis was performed. The catheter was removed and a dressing
was applied. The patient tolerated the procedure well without
immediate post procedural complication.
FINDINGS: A total of approximately 4.6 L of clear yellow fluid was removed.
Samples were sent to the laboratory as requested by the clinical
team.
IMPRESSION: Successful ultrasound-guided paracentesis yielding 4.6 liters of
peritoneal fluid. Read by: Urszulla Nagraba, NP

## 2020-08-08 IMAGING — US IR PARACENTESIS
1 series · 3 of 3 positions shown · non-contrast
Comparison: none

INDICATION: History of alcoholic cirrhosis with recurrent ascites. Request is
made for diagnostic and therapeutic paracentesis.

[Series 1: ir (id) (id)/(id)/(id) ir · 3 of 3 slices shown]
[im 1/3]
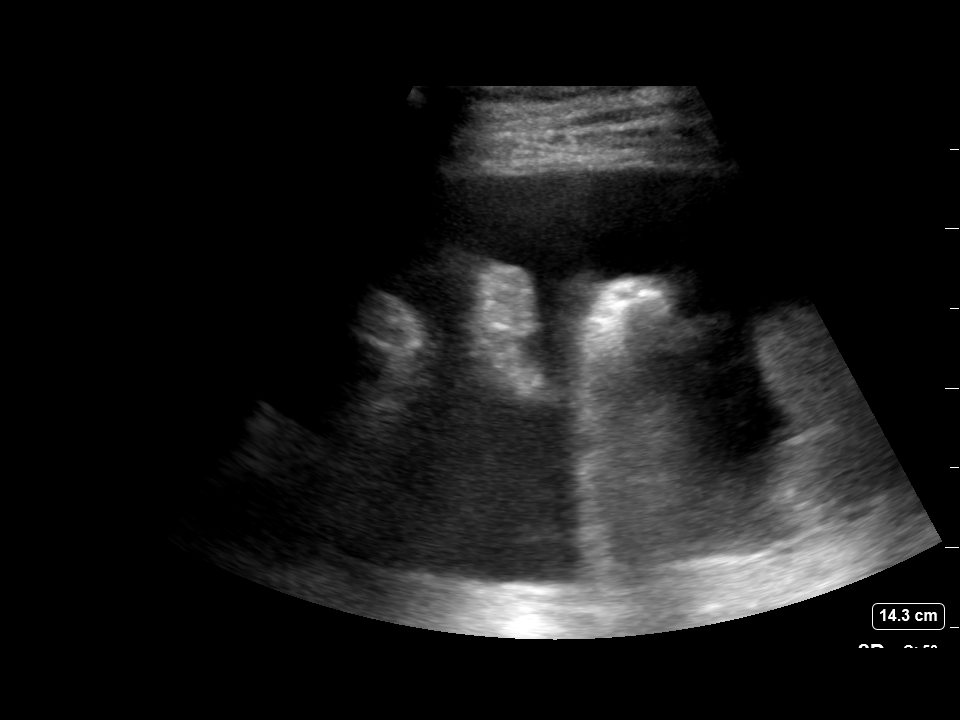
[im 2/3]
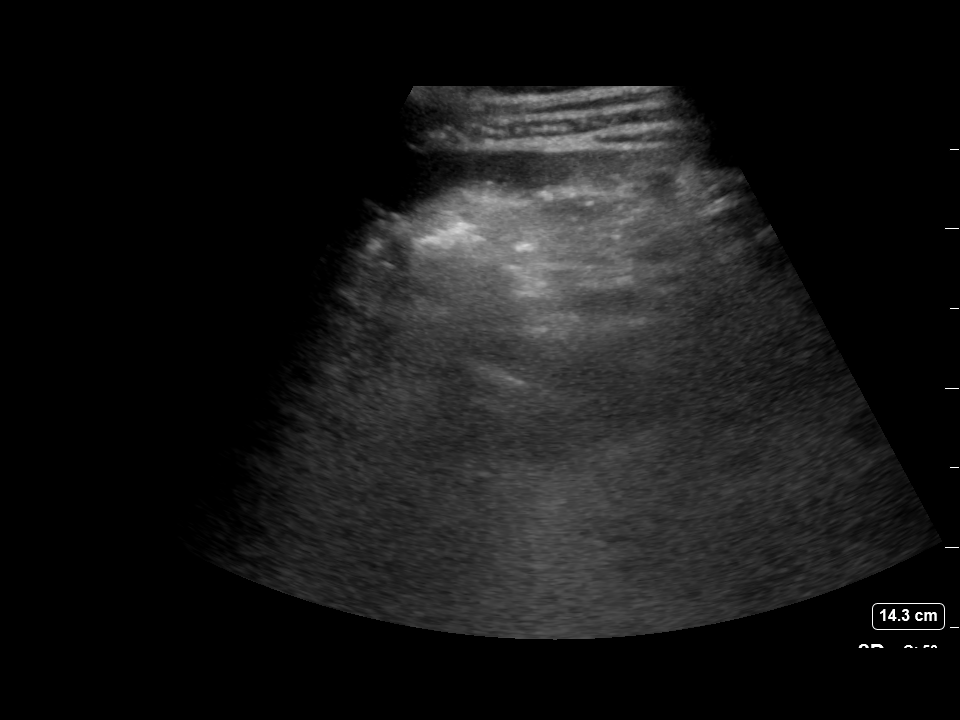
[im 3/3]
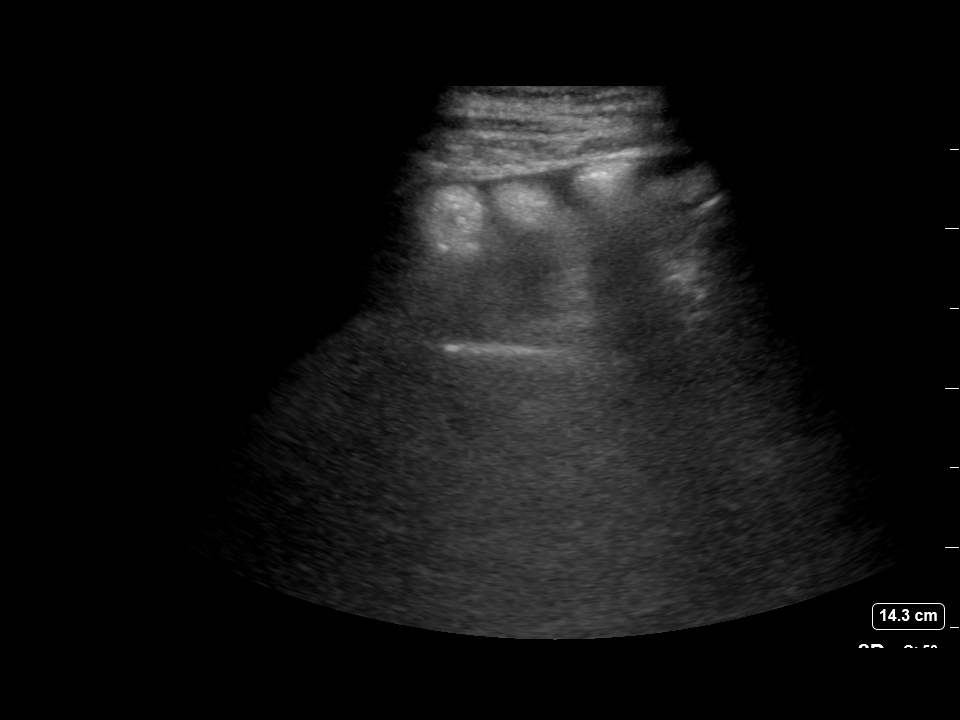

[3 of 3 positions shown; findings below may reference images not displayed]

EXAM:
ULTRASOUND GUIDED RIGHT LOWER QUADRANT PARACENTESIS

MEDICATIONS:
None.

COMPLICATIONS:
None immediate.

PROCEDURE:
Informed written consent was obtained from the patient after a
discussion of the risks, benefits and alternatives to treatment. A
timeout was performed prior to the initiation of the procedure.

Initial ultrasound scanning demonstrates a small to moderate amount
of ascites within the right lower abdominal quadrant. The right
lower abdomen was prepped and draped in the usual sterile fashion.
1% lidocaine was used for local anesthesia.

Following this, a 19 gauge, 7-cm, Yueh catheter was introduced. An
ultrasound image was saved for documentation purposes. The
paracentesis was performed. The catheter was removed and a dressing
was applied. The patient tolerated the procedure well without
immediate post procedural complication.
FINDINGS: A total of approximately 2.8 L of clear yellow fluid was removed.
Samples were sent to the laboratory as requested by the clinical
team.
IMPRESSION: Successful ultrasound-guided paracentesis yielding 2.8 liters of
peritoneal fluid.

## 2020-09-28 ENCOUNTER — Other Ambulatory Visit: Payer: Self-pay

## 2021-05-28 IMAGING — DX DG CHEST 1V PORT
1 series · 1 of 1 positions shown · non-contrast
Comparison: 03/30/2020

CLINICAL DATA: Recent cardiac arrest, status post intubation

EXAM:
PORTABLE CHEST 1 VIEW

[chest ap]
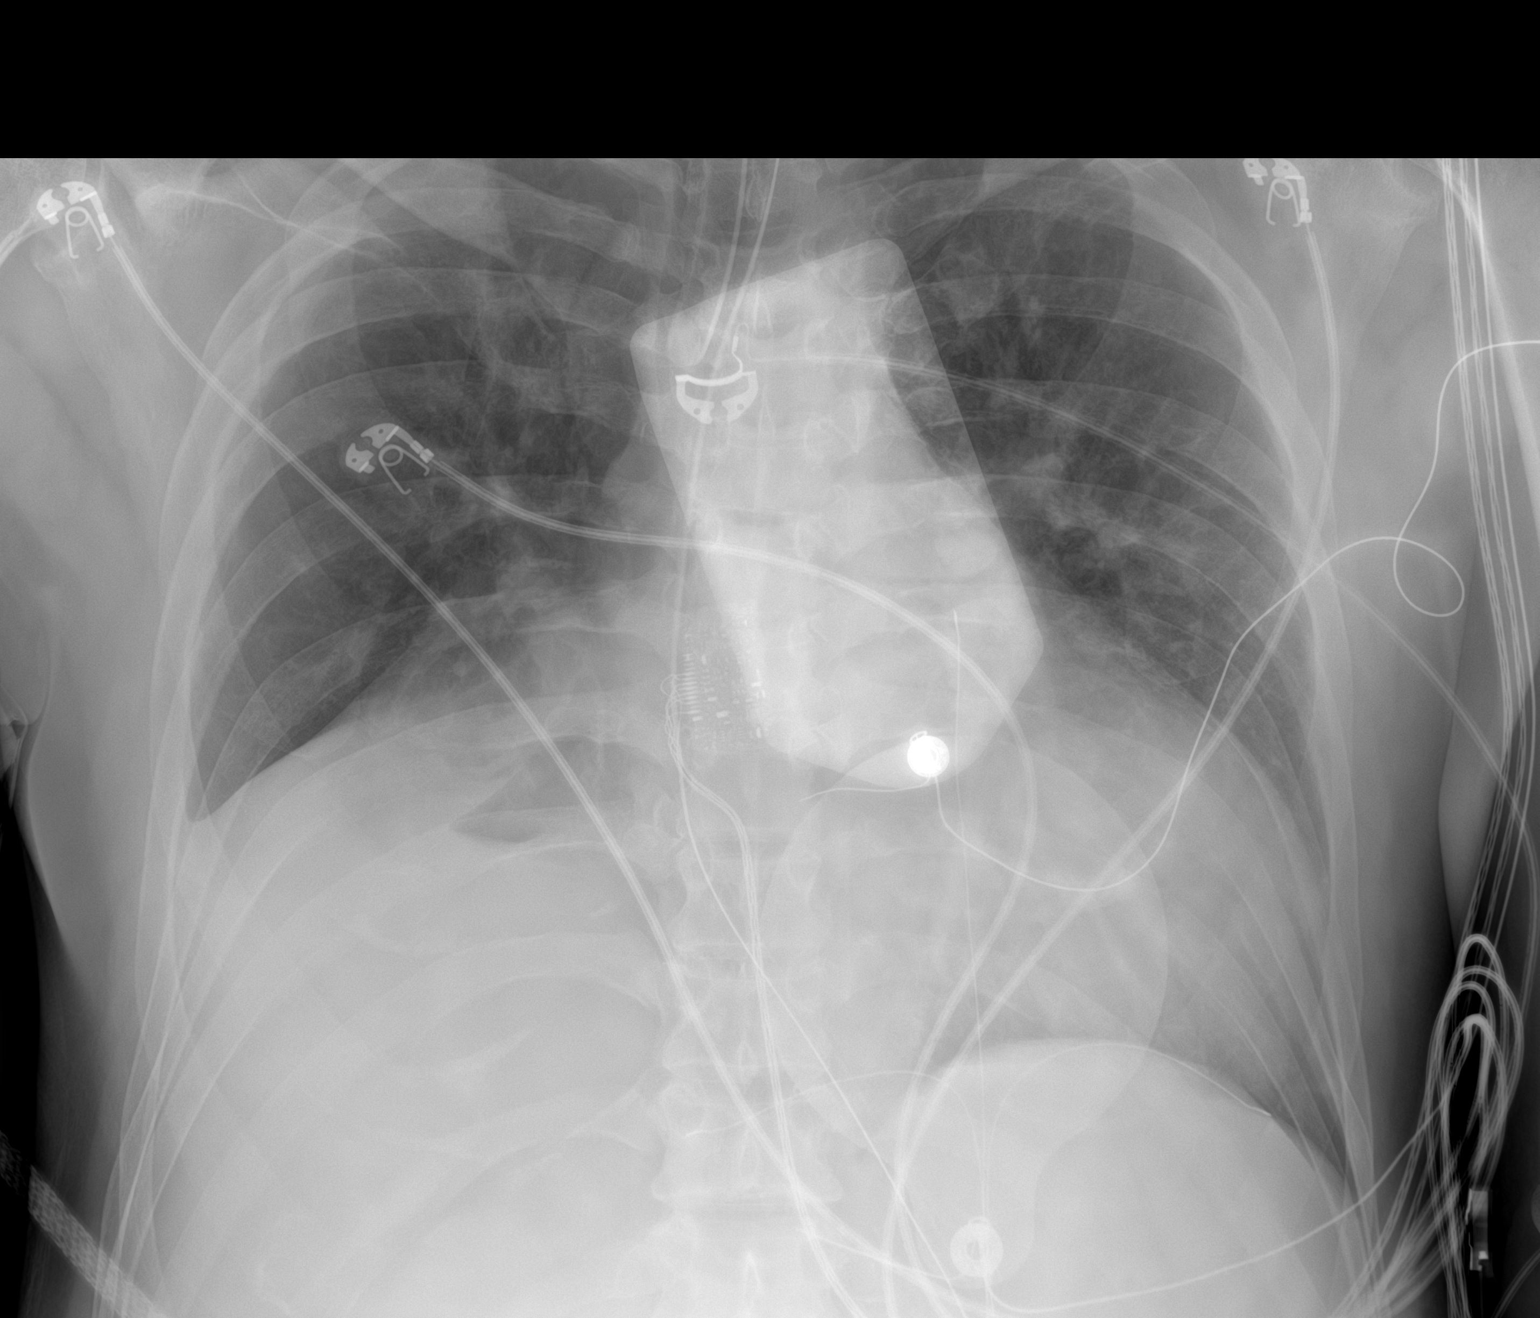

[1 of 1 positions shown; findings below may reference images not displayed]

FINDINGS: Cardiac shadow is stable. Endotracheal tube and gastric catheter are
noted in satisfactory position. Right basilar consolidation is noted
medially with small right pleural effusion. No definitive rib
abnormality is noted. Mild vascular congestion is seen as well.
IMPRESSION: Mild vascular congestion.

Right lower lobe consolidation with associated effusion.

Tubes and lines as described in satisfactory position.

## 2021-05-29 IMAGING — DX DG CHEST 1V PORT
1 series · 1 of 1 positions shown · non-contrast
Comparison: Radiograph 05/29/2020

CLINICAL DATA: Respiratory failure

EXAM:
PORTABLE CHEST 1 VIEW

[chest ap]
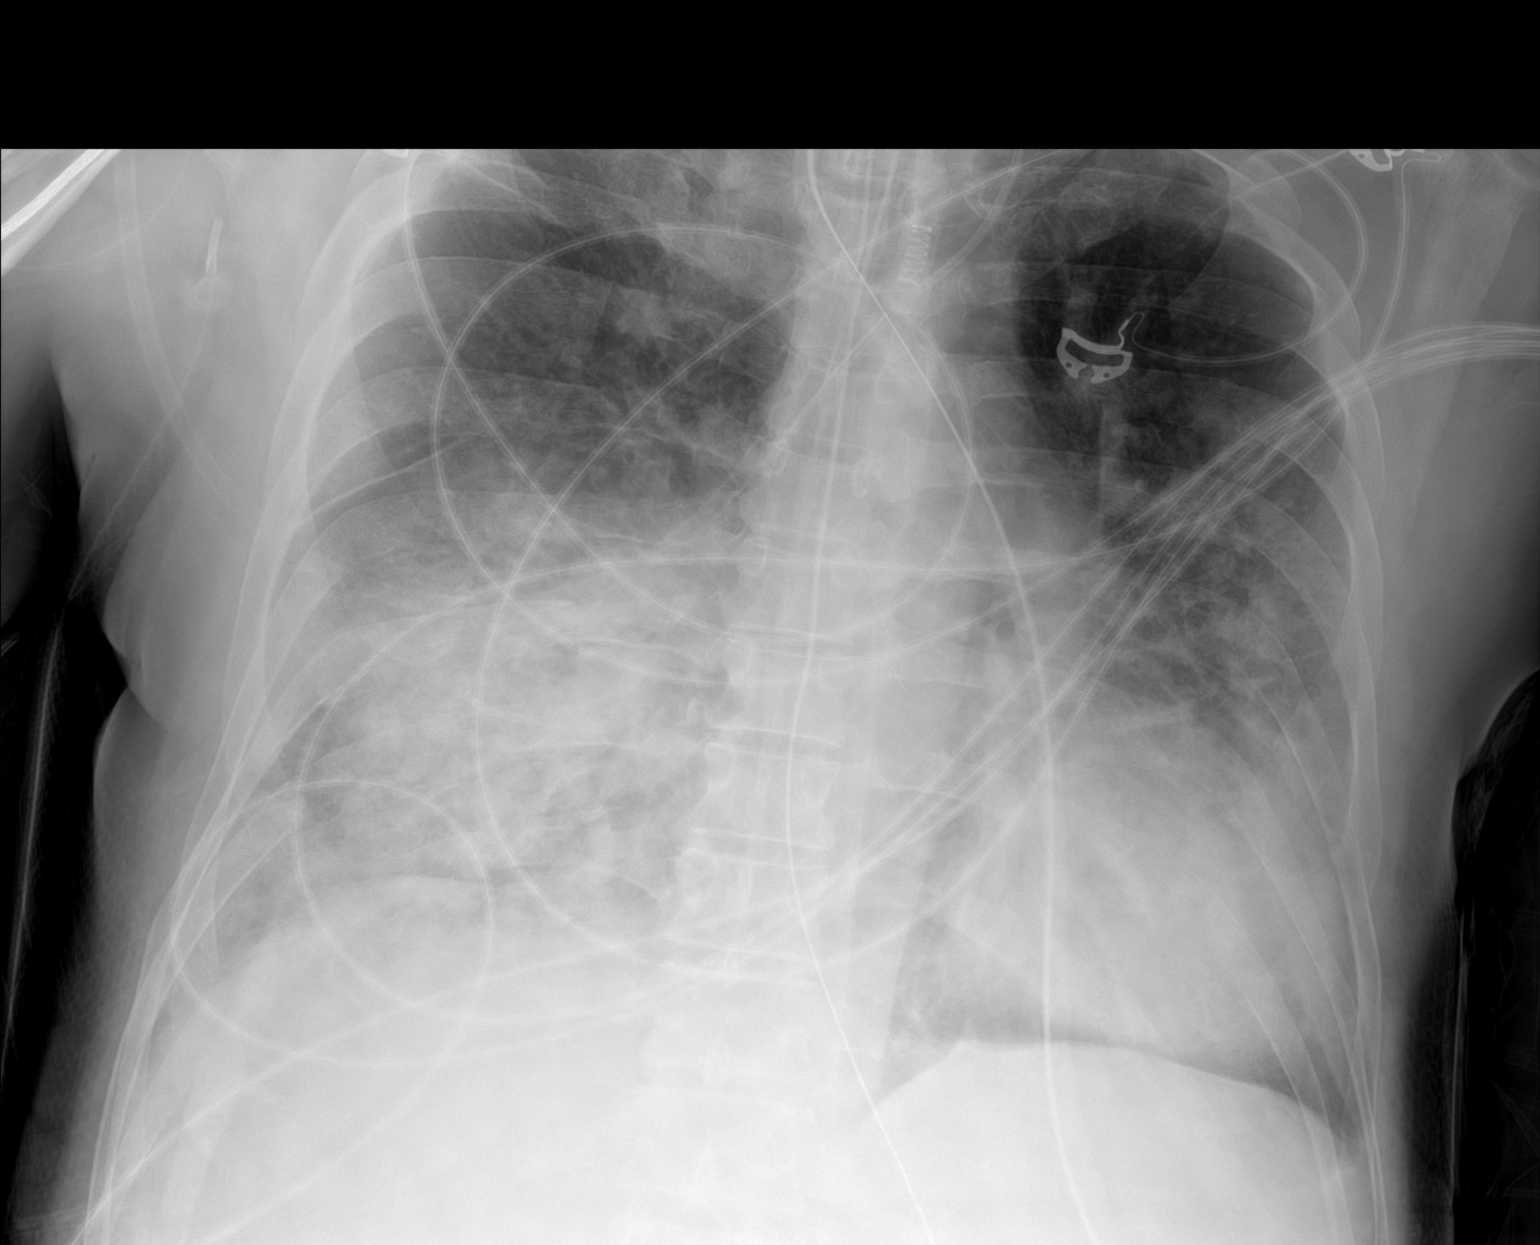

[1 of 1 positions shown; findings below may reference images not displayed]

FINDINGS: Endotracheal tube tip terminates 4.2 cm in the carina.
Transesophageal tube tip terminates below the margins of imaging.
Left IJ catheter terminates near the left brachiocephalic-caval
confluence. Additional external support devices and telemetry leads
overlie the chest.

Redemonstration of the bilateral heterogeneous opacities with a mid
to lower lung distribution. Overall increasing coalescence of
opacity throughout both lungs. No pneumothorax. Questionable trace
effusions. Cardiomediastinal contours are partially obscured by
opacity but visible contour appear grossly similar. No acute osseous
or soft tissue abnormality.
IMPRESSION: 1. Increasing coalescence of opacity throughout both lungs.
Appearance could reflect worsening edema, pneumonia or developing
ARDS.
2. Support devices as above.

## 2021-05-30 IMAGING — DX DG CHEST 1V PORT
1 series · 1 of 1 positions shown · non-contrast
Comparison: Radiograph 05/30/2020

CLINICAL DATA: Abnormal respiration

EXAM:
PORTABLE CHEST 1 VIEW

[chest ap]
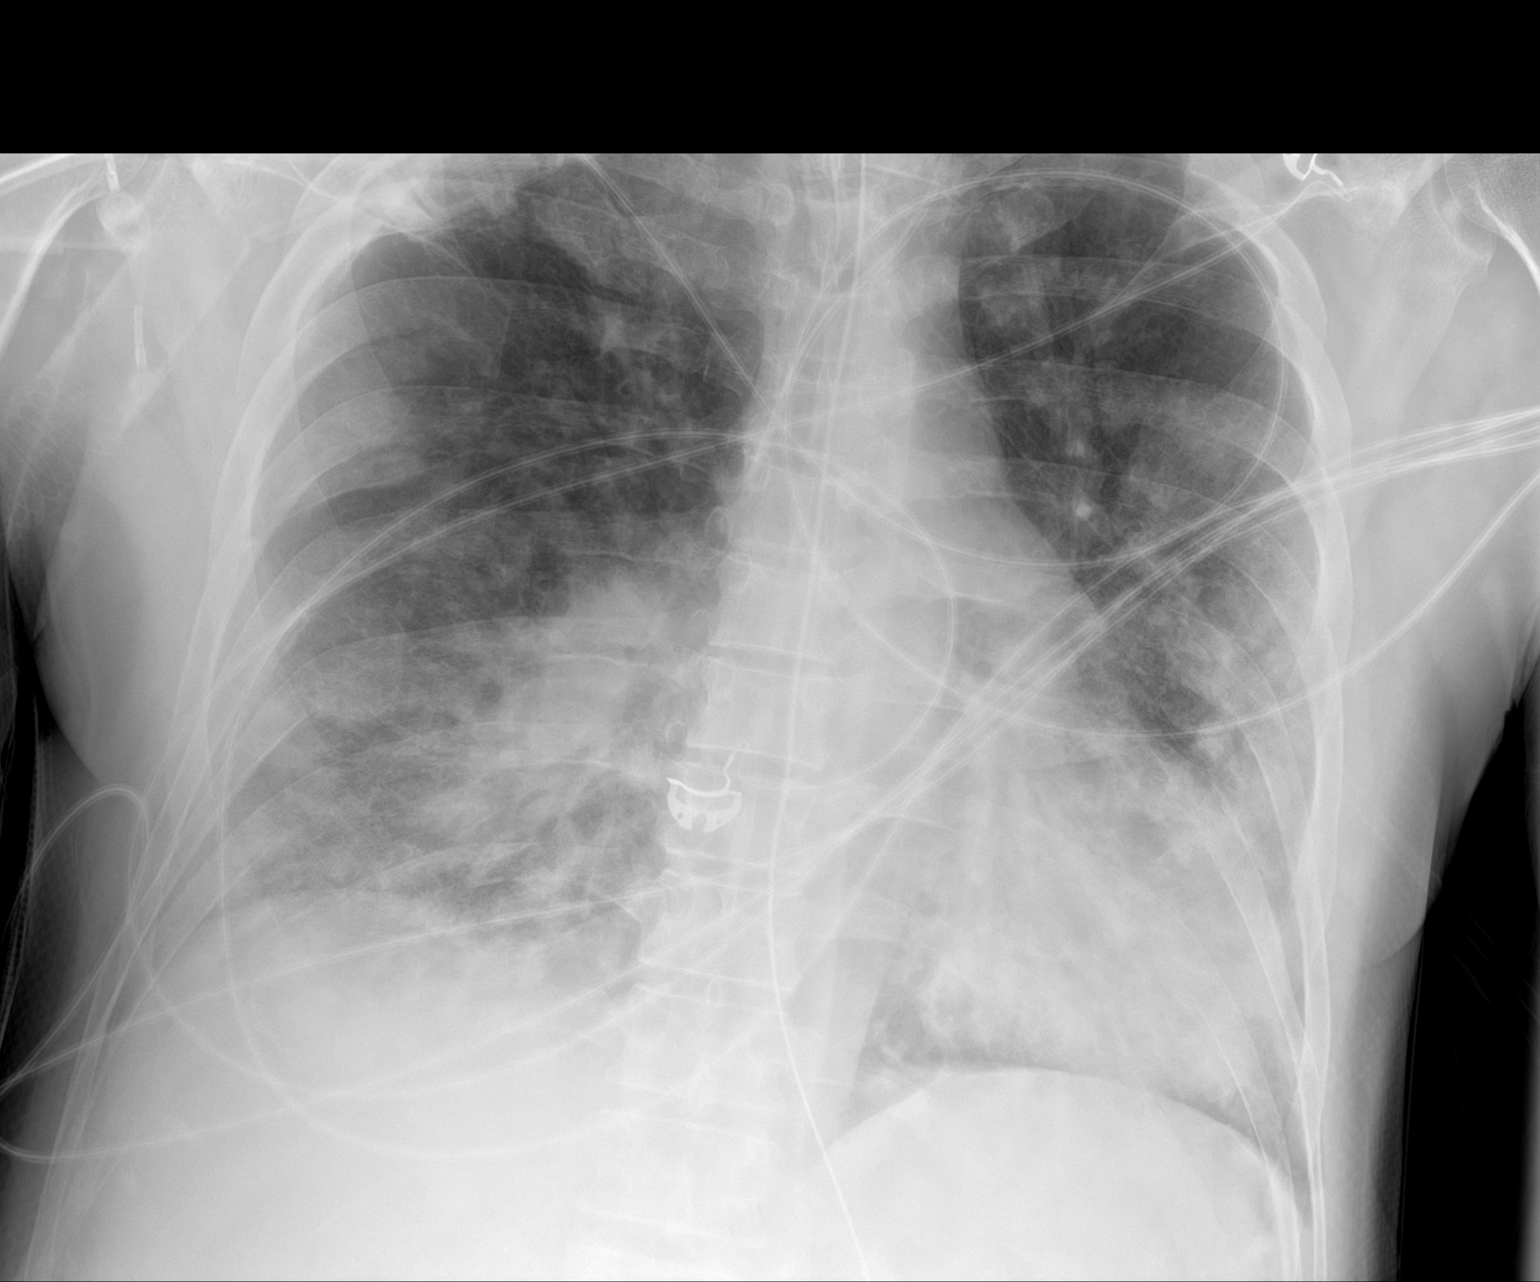

[1 of 1 positions shown; findings below may reference images not displayed]

FINDINGS: Stable satisfactory positioning of an endotracheal tube,
transesophageal tube and left IJ approach central venous catheter.
Telemetry leads and external support devices remain over the chest.

Persistent bilateral heterogeneous opacities are present in the
lungs with some slight interval clearing in the mid to lower lung
fields. Stable cardiomediastinal contours accounting for differences
in inflation in technique. No pneumothorax. Suspect trace residual
effusions. No acute osseous or soft tissue abnormality.
IMPRESSION: 1. Persistent bilateral heterogeneous opacities in the lungs with
some slight interval clearing in the mid to lower lungs.
2. Stable support devices.
